# Patient Record
Sex: Male | Born: 1940 | Race: White | Hispanic: No | Marital: Married | Smoking: Former smoker
Health system: Southern US, Community
[De-identification: ages and names within clinical notes are randomized; demographics above are authoritative.]

## PROBLEM LIST (undated history)

## (undated) DIAGNOSIS — E119 Type 2 diabetes mellitus without complications: Secondary | ICD-10-CM

## (undated) DIAGNOSIS — I480 Paroxysmal atrial fibrillation: Secondary | ICD-10-CM

## (undated) DIAGNOSIS — J449 Chronic obstructive pulmonary disease, unspecified: Secondary | ICD-10-CM

## (undated) DIAGNOSIS — R0600 Dyspnea, unspecified: Secondary | ICD-10-CM

## (undated) DIAGNOSIS — Z87442 Personal history of urinary calculi: Secondary | ICD-10-CM

## (undated) DIAGNOSIS — I5020 Unspecified systolic (congestive) heart failure: Secondary | ICD-10-CM

## (undated) DIAGNOSIS — I714 Abdominal aortic aneurysm, without rupture, unspecified: Secondary | ICD-10-CM

## (undated) DIAGNOSIS — Z8719 Personal history of other diseases of the digestive system: Secondary | ICD-10-CM

## (undated) DIAGNOSIS — M4800 Spinal stenosis, site unspecified: Secondary | ICD-10-CM

## (undated) DIAGNOSIS — I495 Sick sinus syndrome: Secondary | ICD-10-CM

## (undated) DIAGNOSIS — E669 Obesity, unspecified: Secondary | ICD-10-CM

## (undated) DIAGNOSIS — I10 Essential (primary) hypertension: Secondary | ICD-10-CM

## (undated) DIAGNOSIS — M1990 Unspecified osteoarthritis, unspecified site: Secondary | ICD-10-CM

## (undated) DIAGNOSIS — C61 Malignant neoplasm of prostate: Secondary | ICD-10-CM

## (undated) DIAGNOSIS — E785 Hyperlipidemia, unspecified: Secondary | ICD-10-CM

## (undated) DIAGNOSIS — I2510 Atherosclerotic heart disease of native coronary artery without angina pectoris: Secondary | ICD-10-CM

## (undated) DIAGNOSIS — Z950 Presence of cardiac pacemaker: Secondary | ICD-10-CM

## (undated) DIAGNOSIS — G4730 Sleep apnea, unspecified: Secondary | ICD-10-CM

## (undated) HISTORY — PX: PACEMAKER INSERTION: SHX728

## (undated) HISTORY — PX: CORONARY ARTERY BYPASS GRAFT: SHX141

## (undated) HISTORY — DX: Atherosclerotic heart disease of native coronary artery without angina pectoris: I25.10

## (undated) HISTORY — PX: REPLACEMENT TOTAL KNEE BILATERAL: SUR1225

## (undated) HISTORY — PX: OTHER SURGICAL HISTORY: SHX169

## (undated) HISTORY — DX: Abdominal aortic aneurysm, without rupture: I71.4

## (undated) HISTORY — PX: CATARACT EXTRACTION: SUR2

## (undated) HISTORY — DX: Abdominal aortic aneurysm, without rupture, unspecified: I71.40

## (undated) HISTORY — DX: Chronic obstructive pulmonary disease, unspecified: J44.9

## (undated) HISTORY — DX: Essential (primary) hypertension: I10

## (undated) HISTORY — PX: JOINT REPLACEMENT: SHX530

## (undated) HISTORY — DX: Obesity, unspecified: E66.9

## (undated) HISTORY — DX: Sleep apnea, unspecified: G47.30

## (undated) HISTORY — DX: Unspecified osteoarthritis, unspecified site: M19.90

## (undated) HISTORY — DX: Sick sinus syndrome: I49.5

## (undated) HISTORY — DX: Hyperlipidemia, unspecified: E78.5

## (undated) HISTORY — DX: Spinal stenosis, site unspecified: M48.00

---

## 1998-07-31 DIAGNOSIS — I495 Sick sinus syndrome: Secondary | ICD-10-CM

## 1998-07-31 DIAGNOSIS — I2510 Atherosclerotic heart disease of native coronary artery without angina pectoris: Secondary | ICD-10-CM

## 1998-07-31 HISTORY — DX: Sick sinus syndrome: I49.5

## 1998-07-31 HISTORY — DX: Atherosclerotic heart disease of native coronary artery without angina pectoris: I25.10

## 1998-11-08 ENCOUNTER — Inpatient Hospital Stay (HOSPITAL_COMMUNITY): Admission: AD | Admit: 1998-11-08 | Discharge: 1998-11-18 | Payer: Self-pay | Admitting: Cardiology

## 1998-11-09 ENCOUNTER — Encounter: Payer: Self-pay | Admitting: Cardiothoracic Surgery

## 1998-11-11 ENCOUNTER — Encounter: Payer: Self-pay | Admitting: Cardiothoracic Surgery

## 1998-11-12 ENCOUNTER — Encounter: Payer: Self-pay | Admitting: Cardiothoracic Surgery

## 1998-11-13 ENCOUNTER — Encounter: Payer: Self-pay | Admitting: Cardiothoracic Surgery

## 1998-11-14 ENCOUNTER — Encounter: Payer: Self-pay | Admitting: Cardiothoracic Surgery

## 1998-11-16 ENCOUNTER — Encounter: Payer: Self-pay | Admitting: Cardiothoracic Surgery

## 1998-11-18 ENCOUNTER — Encounter: Payer: Self-pay | Admitting: Cardiothoracic Surgery

## 2003-06-09 ENCOUNTER — Encounter: Payer: Self-pay | Admitting: Cardiology

## 2003-12-31 ENCOUNTER — Encounter: Payer: Self-pay | Admitting: Cardiology

## 2004-05-12 ENCOUNTER — Encounter: Payer: Self-pay | Admitting: Cardiology

## 2004-09-01 ENCOUNTER — Ambulatory Visit (HOSPITAL_COMMUNITY): Admission: RE | Admit: 2004-09-01 | Discharge: 2004-09-01 | Payer: Self-pay | Admitting: Neurosurgery

## 2004-09-20 ENCOUNTER — Ambulatory Visit: Payer: Self-pay | Admitting: Internal Medicine

## 2004-10-06 ENCOUNTER — Inpatient Hospital Stay (HOSPITAL_COMMUNITY): Admission: RE | Admit: 2004-10-06 | Discharge: 2004-10-10 | Payer: Self-pay | Admitting: Neurosurgery

## 2005-09-01 ENCOUNTER — Ambulatory Visit: Payer: Self-pay | Admitting: Internal Medicine

## 2006-08-01 ENCOUNTER — Ambulatory Visit (HOSPITAL_COMMUNITY): Admission: RE | Admit: 2006-08-01 | Discharge: 2006-08-01 | Payer: Self-pay | Admitting: Neurosurgery

## 2006-08-13 ENCOUNTER — Ambulatory Visit: Payer: Self-pay | Admitting: Internal Medicine

## 2006-08-14 ENCOUNTER — Ambulatory Visit: Payer: Self-pay | Admitting: Cardiology

## 2006-08-20 ENCOUNTER — Inpatient Hospital Stay (HOSPITAL_COMMUNITY): Admission: RE | Admit: 2006-08-20 | Discharge: 2006-08-25 | Payer: Self-pay | Admitting: Neurosurgery

## 2007-08-09 ENCOUNTER — Ambulatory Visit: Payer: Self-pay | Admitting: Internal Medicine

## 2007-11-26 ENCOUNTER — Inpatient Hospital Stay (HOSPITAL_COMMUNITY): Admission: RE | Admit: 2007-11-26 | Discharge: 2007-12-01 | Payer: Self-pay | Admitting: Neurosurgery

## 2008-03-27 ENCOUNTER — Ambulatory Visit: Payer: Self-pay | Admitting: Internal Medicine

## 2008-07-27 ENCOUNTER — Encounter: Admission: RE | Admit: 2008-07-27 | Discharge: 2008-07-27 | Payer: Self-pay | Admitting: Orthopedic Surgery

## 2008-09-14 ENCOUNTER — Ambulatory Visit: Payer: Self-pay | Admitting: Internal Medicine

## 2008-10-09 ENCOUNTER — Ambulatory Visit: Payer: Self-pay | Admitting: Cardiovascular Disease

## 2008-10-16 ENCOUNTER — Inpatient Hospital Stay (HOSPITAL_COMMUNITY): Admission: RE | Admit: 2008-10-16 | Discharge: 2008-10-19 | Payer: Self-pay | Admitting: Orthopedic Surgery

## 2008-12-07 ENCOUNTER — Encounter: Payer: Self-pay | Admitting: Internal Medicine

## 2009-04-09 ENCOUNTER — Encounter: Payer: Self-pay | Admitting: Internal Medicine

## 2009-04-29 ENCOUNTER — Telehealth (INDEPENDENT_AMBULATORY_CARE_PROVIDER_SITE_OTHER): Payer: Self-pay | Admitting: *Deleted

## 2009-06-04 ENCOUNTER — Inpatient Hospital Stay (HOSPITAL_COMMUNITY): Admission: RE | Admit: 2009-06-04 | Discharge: 2009-06-07 | Payer: Self-pay | Admitting: Orthopedic Surgery

## 2009-08-18 ENCOUNTER — Telehealth (INDEPENDENT_AMBULATORY_CARE_PROVIDER_SITE_OTHER): Payer: Self-pay | Admitting: *Deleted

## 2009-10-08 ENCOUNTER — Encounter: Payer: Self-pay | Admitting: Internal Medicine

## 2009-10-22 ENCOUNTER — Ambulatory Visit: Payer: Self-pay | Admitting: Internal Medicine

## 2009-10-22 DIAGNOSIS — I10 Essential (primary) hypertension: Secondary | ICD-10-CM

## 2009-10-22 DIAGNOSIS — I495 Sick sinus syndrome: Secondary | ICD-10-CM

## 2009-10-22 DIAGNOSIS — I2510 Atherosclerotic heart disease of native coronary artery without angina pectoris: Secondary | ICD-10-CM | POA: Insufficient documentation

## 2010-01-07 ENCOUNTER — Encounter: Payer: Self-pay | Admitting: Internal Medicine

## 2010-04-08 ENCOUNTER — Encounter: Payer: Self-pay | Admitting: Internal Medicine

## 2010-07-08 ENCOUNTER — Encounter: Payer: Self-pay | Admitting: Internal Medicine

## 2010-08-25 LAB — COMPREHENSIVE METABOLIC PANEL
AST: 22 U/L (ref 0–37)
Albumin: 4.2 g/dL (ref 3.5–5.2)
Chloride: 105 mEq/L (ref 96–112)
Creatinine, Ser: 1.57 mg/dL — ABNORMAL HIGH (ref 0.4–1.5)
Potassium: 4.6 mEq/L (ref 3.5–5.1)
Total Bilirubin: 0.7 mg/dL (ref 0.3–1.2)
Total Protein: 7.5 g/dL (ref 6.0–8.3)

## 2010-08-25 LAB — SURGICAL PCR SCREEN: Staphylococcus aureus: POSITIVE — AB

## 2010-08-25 LAB — URINALYSIS, ROUTINE W REFLEX MICROSCOPIC
Bilirubin Urine: NEGATIVE
Ketones, ur: NEGATIVE mg/dL
Nitrite: NEGATIVE
Urobilinogen, UA: 0.2 mg/dL (ref 0.0–1.0)

## 2010-08-25 LAB — CBC
HCT: 43.3 % (ref 39.0–52.0)
MCH: 31.2 pg (ref 26.0–34.0)
MCHC: 33.9 g/dL (ref 30.0–36.0)
RDW: 14.2 % (ref 11.5–15.5)

## 2010-08-25 LAB — APTT: aPTT: 32 seconds (ref 24–37)

## 2010-08-31 ENCOUNTER — Inpatient Hospital Stay (HOSPITAL_COMMUNITY)
Admission: RE | Admit: 2010-08-31 | Discharge: 2010-09-03 | DRG: 470 | Disposition: A | Discharge status: Home Health | Payer: Medicare Other | Attending: Orthopedic Surgery | Admitting: Orthopedic Surgery

## 2010-08-31 DIAGNOSIS — Z951 Presence of aortocoronary bypass graft: Secondary | ICD-10-CM

## 2010-08-31 DIAGNOSIS — I2510 Atherosclerotic heart disease of native coronary artery without angina pectoris: Secondary | ICD-10-CM | POA: Diagnosis present

## 2010-08-31 DIAGNOSIS — G4733 Obstructive sleep apnea (adult) (pediatric): Secondary | ICD-10-CM | POA: Diagnosis present

## 2010-08-31 DIAGNOSIS — E119 Type 2 diabetes mellitus without complications: Secondary | ICD-10-CM | POA: Diagnosis present

## 2010-08-31 DIAGNOSIS — M1710 Unilateral primary osteoarthritis, unspecified knee: Principal | ICD-10-CM | POA: Diagnosis present

## 2010-08-31 DIAGNOSIS — Z96659 Presence of unspecified artificial knee joint: Secondary | ICD-10-CM

## 2010-08-31 DIAGNOSIS — E7800 Pure hypercholesterolemia, unspecified: Secondary | ICD-10-CM | POA: Diagnosis present

## 2010-08-31 DIAGNOSIS — I10 Essential (primary) hypertension: Secondary | ICD-10-CM | POA: Diagnosis present

## 2010-08-31 LAB — GLUCOSE, CAPILLARY
Glucose-Capillary: 126 mg/dL — ABNORMAL HIGH (ref 70–99)
Glucose-Capillary: 121 mg/dL — ABNORMAL HIGH (ref 70–99)
Glucose-Capillary: 182 mg/dL — ABNORMAL HIGH (ref 70–99)

## 2010-08-31 LAB — TYPE AND SCREEN: ABO/RH(D): O POS

## 2010-08-31 NOTE — Op Note (Signed)
[name], [name]               ACCOUNT NO.:  [id number]  MEDICAL RECORD NO.:  [id number]           PATIENT TYPE:  I  LOCATION:  0004                         FACILITY:  [facility]  PHYSICIAN:  [name], M.D.    DATE OF BIRTH:  [birth year]  DATE OF PROCEDURE: DATE OF DISCHARGE:                              OPERATIVE REPORT   PREOPERATIVE DIAGNOSIS:  Osteoarthritis, left knee.  POSTOPERATIVE DIAGNOSIS:  Osteoarthritis, left knee.  PROCEDURE:  Left total knee arthroplasty.  SURGEON:  [name], M.D.  ASSISTANT:  Arlee Muslim, PA-C  ANESTHESIA:  General.  ESTIMATED BLOOD LOSS:  Minimal.  DRAINS:  Hemovac times one.  TOURNIQUET TIME:  11 minutes at 300 mmHg.  COMPLICATIONS:  None.  CONDITION:  Stable to recovery room.  BRIEF CLINICAL NOTE:  Mr. Arnzen is a 70 year old male with advanced end- stage arthritis of the left knee with progressively worsening pain and dysfunction.  He has had a recent successful right total knee arthroplasty and presents now for a left total knee arthroplasty.  PROCEDURE IN DETAIL:  After successful administration of general anesthetic, a tourniquet was placed high on his left thigh, and his left lower extremity prepped and draped in the usual sterile fashion. Extremity was wrapped in Esmarch, knee flexed and tourniquet inflated to 300 mmHg.  Midline incision was made with a 10 blade through subcutaneous tissue to the extensor mechanism.  A fresh blade was used to make a medial parapatellar arthrotomy.  Soft tissue of proximal medial tibia was subperiosteally elevated to the joint line with the knife and into the semimembranosus bursa with a Cobb elevator.  Soft tissue laterally was elevated with attention being paid to avoid the patella tendon on tibial tubercle.  Patella was everted, knee flexed 90 degrees and ACL and PCL removed.  Drill was used create a starting hole in the distal femur.  Canal was thoroughly irrigated.  A 5-degree  left valgus alignment guide was placed.  Referencing off the posterior condyles, rotations marked and the block pinned to remove 11 mm of the distal femur.  Distal femoral resection was made with an oscillating saw.  The tibia was subluxed forward, and the menisci were removed. Extramedullary tibial alignment guide was placed referencing proximally at the medial aspect of the tibial tubercle and distally along the second metatarsal axis tibial crest.  Blocks pinned to remove 2 mm off the more deficient medial side.  There was a large tibial defect, so we took a little more bone than usual at the base of this defect.  Size 5 was the most appropriate tibial component, and proximal tibia was then prepared with the modular drill and keel punch for the size 5.  At this point, it is noted that the tourniquet was not functioning and was bleeding through the tourniquet.  We deflated the tourniquet, and the bleeding actually decreased.  We then placed the sizing block on the femur, and size 4 was most appropriate.  Rotation was marked off the epicondylar axis and confirmed by creating rectangular flexion gap at 90 degrees.  A size 4 cutting block was placed in this rotation  and the anterior-posterior and chamfer cuts made.  Intercondylar blocks were placed, and that cut was made. Trial size 4 posterior stabilized femur was placed.  A 12.5 mm posterior stabilized rotating platform insert trial was placed.  There was a little play with the 12.5, so I went to 15, which allowed for full extension with excellent varus-valgus and anterior-posterior balance throughout full range of motion.  Patella was everted, thickness measured to be 25 mm.  Freehand resection was taken to 13 mm, 41 template was placed, lug holes were drilled, trial patella was placed, and it tracked normally.  Osteophytes were removed off the posterior femur with the trial in place.  All trials were removed, and the cut bone  surfaces were prepared with pulsatile lavage.  The cement was mixed, and once ready for implantation, the size 5 mobile bearing tibial tray, size 4 posterior stabilized femur and 41 patella were cemented into place.  Patella was held with a clamp.  Trial 15-mm insert was placed, knee held in full extension, and all extruded cement removed. When cement was fully hardened, then the permanent 15-mm posterior stabilized rotating platform insert was placed in the tibial tray.  The wound was copiously irrigated with saline solution, and the arthrotomy closed over Hemovac drain with interrupted #1 PDS.  Flexion against gravity was 140 degrees.  Patella tracked normally.  Subcutaneous was closed with interrupted 2-0 Vicryl, subcuticular with running 4-0 Monocryl.  Catheter for the Marcaine pain pump was placed, and the pump was initiated.  Incision was cleaned and dried, and Steri-Strips and a bulky sterile dressing applied.  He was then placed into a knee immobilizer, awakened and transported to recovery in stable condition.     [name], M.D.     FA/MEDQ  D:  08/31/2010  T:  08/31/2010  Job:  [id number]  Electronically Signed by [name] M.D. on 08/31/2010 03:40:47 PM

## 2010-09-01 LAB — GLUCOSE, CAPILLARY
Glucose-Capillary: 164 mg/dL — ABNORMAL HIGH (ref 70–99)
Glucose-Capillary: 111 mg/dL — ABNORMAL HIGH (ref 70–99)

## 2010-09-01 LAB — BASIC METABOLIC PANEL
BUN: 18 mg/dL (ref 6–23)
Chloride: 104 mEq/L (ref 96–112)
Creatinine, Ser: 1.27 mg/dL (ref 0.4–1.5)
Glucose, Bld: 180 mg/dL — ABNORMAL HIGH (ref 70–99)
Potassium: 4.5 mEq/L (ref 3.5–5.1)

## 2010-09-01 LAB — CBC
HCT: 33 % — ABNORMAL LOW (ref 39.0–52.0)
Hemoglobin: 11.1 g/dL — ABNORMAL LOW (ref 13.0–17.0)
MCH: 30.9 pg (ref 26.0–34.0)
MCHC: 33.6 g/dL (ref 30.0–36.0)
RDW: 14.1 % (ref 11.5–15.5)

## 2010-09-01 LAB — PROTIME-INR: INR: 1.17 (ref 0.00–1.49)

## 2010-09-01 NOTE — Cardiovascular Report (Signed)
Summary: TTM   TTM   Imported By: Sallee Provencal 04/20/2010 11:49:13  _____________________________________________________________________  External Attachment:    Type:   Image     Comment:   External Document

## 2010-09-01 NOTE — Cardiovascular Report (Signed)
Summary: TTM   TTM   Imported By: Sallee Provencal 07/29/2010 11:45:33  _____________________________________________________________________  External Attachment:    Type:   Image     Comment:   External Document

## 2010-09-01 NOTE — Cardiovascular Report (Signed)
Summary: Card Device [facility]/ INTERROGATION REPORT  Card Device Clinic/ INTERROGATION REPORT   Imported By: Bartholomew Boards 10/25/2009 12:38:05  _____________________________________________________________________  External Attachment:    Type:   Image     Comment:   External Document

## 2010-09-01 NOTE — Cardiovascular Report (Signed)
Summary: TTM  TTM   Imported By: Sallee Provencal 01/20/2010 10:08:47  _____________________________________________________________________  External Attachment:    Type:   Image     Comment:   External Document

## 2010-09-01 NOTE — Cardiovascular Report (Signed)
Summary: TTM  TTM   Imported By: Sallee Provencal 10/28/2009 12:46:15  _____________________________________________________________________  External Attachment:    Type:   Image     Comment:   External Document

## 2010-09-01 NOTE — Progress Notes (Signed)
Summary: phillips wants to check pacer more often  Phone Note Call from Patient Call back at Home Phone [phone]   Caller: Patient Reason for Call: Talk to Nurse Summary of Call: pt was followed in the [place] office and since Dr Caryl Comes dosen't go phillips wants to do phone checks more and since we have not reponded they are about to drop him and he needs help with this Initial call taken by: Shelda Pal,  August 18, 2009 11:55 AM  Follow-up for Phone Call        The patient recieved a letter from [facility] needing authorization for renewal for TTM's.  I left a message with Jennet Maduro @ [facility] to address this and all of our patients testing with them.  Mr. Litfin is to call the [facility] office to schedule his yearly appointment for an in-office pacer check with Dr. Rayann Heman and I will have the issue taken care of with Hardin Negus by the time he due for his next TTM which is 3 months from his check in office.  Follow-up by: Alma Friendly, LPN,  January 19, 624THL 1:42 PM

## 2010-09-01 NOTE — Assessment & Plan Note (Signed)
Summary: PACER CK   Visit Type:  Pacemaker check Primary Provider:  Manuella Ghazi  CC:  pacer check.  History of Present Illness: The patient presents today for routine electrophysiology followup. He reports doing very well since last being seen in our clinic. The patient denies symptoms of palpitations, chest pain, shortness of breath, orthopnea, PND, lower extremity edema, dizziness, presyncope, syncope, or neurologic sequela. The patient is tolerating medications without difficulties and is otherwise without complaint today.   Preventive Screening-Counseling & Management  Alcohol-Tobacco     Smoking Status: quit     Year Quit: 2000  Current Medications (verified): 1)  Actos 15 Mg Tabs (Pioglitazone Hcl) .... Take 1 Tablet By Mouth Once A Day 2)  Metformin Hcl 500 Mg Tabs (Metformin Hcl) .... Take 1 Tablet By Mouth Once A Day 3)  Amaryl 2 Mg Tabs (Glimepiride) .... Take 1 Tablet By Mouth Once A Day 4)  Diclofenac Sodium 75 Mg Tbec (Diclofenac Sodium) .... Take 1 Tablet By Mouth Two Times A Day 5)  Atenolol 50 Mg Tabs (Atenolol) .... Take 1 Tablet By Mouth Once A Day 6)  Altace 5 Mg Caps (Ramipril) .... Take 1 Tablet By Mouth Once A Day 7)  Simvastatin 20 Mg Tabs (Simvastatin) .... Take 1 Tablet By Mouth Once A Day 8)  Aspir-Low 81 Mg Tbec (Aspirin) .... Take 1 Tablet By Mouth Once A Day 9)  Multivitamins  Tabs (Multiple Vitamin) .... Take 1 Tablet By Mouth Once A Day  Past History:  Past Medical History: 1. CAD, status post CABG. 2. Symptomatic bradycardia, status post permanent pacemaker. 3. Spinal stenosis s/p multiple prior surgeries 4. Severe osteoarthritis s/p hip and knee replacements 5. Hypertension. 6. Type 2 diabetes. 7. Dyslipidemia.  8. Obesity  Family History: Father died at 25 from a stroke.  Mother died at 11 of a chronic kidney disease and hypertension.   Social History: The patient is a retired Software engineer.  He worked at [employer] for many years.  He retired in  2007.  He smoked cigarettes until 2000.  No history of alcohol abuse.  Smoking Status:  quit  Vital Signs:  Patient profile:   70 year old male Height:      68 inches Weight:      258 pounds BMI:     39.37 Pulse rate:   68 / minute BP sitting:   158 / 79  (left arm) Cuff size:   large  Vitals Entered By: Georgina Peer (October 22, 2009 4:20 PM)  Nutrition Counseling: Patient's BMI is greater than 25 and therefore counseled on weight management options.  Serial Vital Signs/Assessments:  Time      Position  BP       Pulse  Resp  Temp     By 4:24 PM             147/80   62                    Georgina Peer  CC: pacer check   Physical Exam  General:  obese, NAD Head:  normocephalic and atraumatic Eyes:  PERRLA/EOM intact; conjunctiva and lids normal. Mouth:  Teeth, gums and palate normal. Oral mucosa normal. Neck:  Neck supple, no JVD. No masses, thyromegaly or abnormal cervical nodes. Chest Wall:  pacemaker site is well healed Lungs:  Clear bilaterally to auscultation and percussion. Heart:  Non-displaced PMI, chest non-tender; regular rate and rhythm, S1, S2 without murmurs, rubs or gallops.  Abdomen:  obese, Bowel sounds positive; abdomen soft and non-tender without masses, organomegaly, or hernias noted. No hepatosplenomegaly. Msk:  s/p prior hip and knee replacement Pulses:  pulses normal in all 4 extremities Extremities:  1+ chronic BLE edema Neurologic:  Alert and oriented x 3. Skin:  Intact without lesions or rashes. Cervical Nodes:  no significant adenopathy Psych:  Normal affect.   PPM Specifications Following MD:  Thompson Grayer, MD     PPM Vendor:  [employer]     PPM Model Number:  [id number]     PPM Serial Number:  [id number] PPM DOI:  11/17/1998     PPM Implanting MD:  Virl Axe, MD  Lead 1    Location: RA     DOI: 11/17/1998     Model #: [id number]     Serial #: [id number]     Status: active Lead 2    Location: RV     DOI: 11/17/1998     Model #: [id number]     Serial #[id number]     Status: active  Magnet Response Rate:  BOL 85 ERI  65  Indications:  Profound brady with HB   PPM Follow Up Remote Check?  No Battery Voltage:  2.77 V     Battery Est. Longevity:  3 years     Pacer Dependent:  No       PPM Device Measurements Atrium  Amplitude: 2.8 mV, Impedance: 374 ohms, Threshold: 1.0 V at 0.4 msec Right Ventricle  Amplitude: 8.0 mV, Impedance: 475 ohms, Threshold: 0.5 V at 0.4 msec  Episodes MS Episodes:  0     Percent Mode Switch:  0     Coumadin:  No Ventricular High Rate:  2     Atrial Pacing:  11%     Ventricular Pacing:  1.3%  Parameters Mode:  DDD     Lower Rate Limit:  55     Upper Rate Limit:  140 Paced AV Delay:  240     Sensed AV Delay:  240 Next Cardiology Appt Due:  03/31/2010 Tech Comments:  No parameter changes.  Device function normal.  TTM's with Hardin Negus.  ROV 6 months [facility] clinic. Alma Friendly, LPN  March 25, 624THL 624THL PM  MD Comments:  agree  Impression & Recommendations:  Problem # 1:  BRADYCARDIA (ICD-427.89) normal pacemaker function no changes today  Problem # 2:  ESSENTIAL HYPERTENSION, BENIGN (ICD-401.1) above goal lisinopril is increased to 10mg  two times a day today the patient is instructed to have Dr Brigitte Pulse check a BMET in 6 wks.  Problem # 3:  CAD (ICD-414.00) stable without sypmtoms of ischemia regular exercise and weight loss are advised  Patient Instructions: 1)  Increase altace (ramipril) to 10mg  by mouth two times a day  Prescriptions: RAMIPRIL 10 MG CAPS (RAMIPRIL) Take one capsule by mouth  two times a day  #60 x 6   Entered by:   Gurney Maxin, RN, BSN   Authorized by:   Thompson Grayer, MD   Signed by:   Gurney Maxin, RN, BSN on 10/22/2009   Method used:   Electronically to        [facility] (retail)       [address]       [place], [place]  [address]       Ph: [phone]       Fax: [phone]   RxID:   [id number]

## 2010-09-02 LAB — BASIC METABOLIC PANEL
BUN: 12 mg/dL (ref 6–23)
CO2: 24 mEq/L (ref 19–32)
Calcium: 8.7 mg/dL (ref 8.4–10.5)
Glucose, Bld: 171 mg/dL — ABNORMAL HIGH (ref 70–99)
Sodium: 134 mEq/L — ABNORMAL LOW (ref 135–145)

## 2010-09-02 LAB — CBC
MCH: 30.8 pg (ref 26.0–34.0)
MCHC: 33.9 g/dL (ref 30.0–36.0)
MCV: 91.1 fL (ref 78.0–100.0)
Platelets: 139 10*3/uL — ABNORMAL LOW (ref 150–400)
RBC: 3.47 MIL/uL — ABNORMAL LOW (ref 4.22–5.81)
RDW: 13.9 % (ref 11.5–15.5)

## 2010-09-02 LAB — GLUCOSE, CAPILLARY
Glucose-Capillary: 227 mg/dL — ABNORMAL HIGH (ref 70–99)
Glucose-Capillary: 147 mg/dL — ABNORMAL HIGH (ref 70–99)

## 2010-09-02 LAB — PROTIME-INR: Prothrombin Time: 15.5 seconds — ABNORMAL HIGH (ref 11.6–15.2)

## 2010-09-03 LAB — PROTIME-INR: INR: 1.47 (ref 0.00–1.49)

## 2010-09-03 LAB — BASIC METABOLIC PANEL
BUN: 21 mg/dL (ref 6–23)
CO2: 22 mEq/L (ref 19–32)
Chloride: 102 mEq/L (ref 96–112)
Creatinine, Ser: 1.35 mg/dL (ref 0.4–1.5)

## 2010-09-03 LAB — CBC
HCT: 29.6 % — ABNORMAL LOW (ref 39.0–52.0)
MCHC: 34.8 g/dL (ref 30.0–36.0)
MCV: 89.7 fL (ref 78.0–100.0)
Platelets: 145 10*3/uL — ABNORMAL LOW (ref 150–400)
RDW: 13.8 % (ref 11.5–15.5)

## 2010-09-14 NOTE — H&P (Signed)
[name], [name]               ACCOUNT NO.:  [id number]  MEDICAL RECORD NO.:  [id number]           PATIENT TYPE:  I  LOCATION:  [id number]                         FACILITY:  [facility]  PHYSICIAN:  [name], M.D.    DATE OF BIRTH:  1941/03/02  DATE OF ADMISSION:  08/31/2010 DATE OF DISCHARGE:  09/03/2010                             HISTORY & PHYSICAL   CHIEF COMPLAINT:  Left knee pain.  HISTORY OF PRESENT ILLNESS:  The patient is a 70 year old male well known to Dr. Pilar Plate Meya Clutter.  He has undergone a previous right total knee and done well.  He now presents to have the other side done.  He has end-stage arthritis that has progressively gotten worse.  He has been seen preoperatively by Dr. Manuella Ghazi and felt to be stable for up and coming procedure.  ALLERGIES:  No known drug allergies.  CURRENT MEDICATIONS:  Actos, metformin, atenolol, ramipril, diclofenac, Amaryl, baby aspirin, vitamin.  PAST MEDICAL HISTORY:  Hypertension, coronary arterial disease, sleep apnea using CPAP machine, hypercholesterolemia, non-insulin-dependent diabetes mellitus, history of renal calculi.  He had a past history of asystole/arrhythmia requiring pacemaker placement.  PAST SURGICAL HISTORY:  Coronary artery bypass grafting of four vessels, status post pacemaker placement secondary to asystole and arrhythmia problems, back surgery x3, right knee scope, right total knee arthroplasty, right total hip replacement.  SOCIAL HISTORY:  Retired Software engineer.  Past smoker, quit in about 2000. No alcohol.  His wife and daughter, who is an Therapist, sports, will be helping him after surgery.  He does have a living will.  FAMILY HISTORY:  Father with stroke.  Mother with hypertension, heart attack.  Oldest sister with hypertension and stroke.  REVIEW OF SYSTEMS:  GENERAL:  No fevers, chills, or night sweats. NEURO:  No seizures, syncope, or paralysis.  RESPIRATORY:  No shortness of breath, productive cough, or hemoptysis.  He  does get a little shortness of breath on exertion, but no shortness of breath at rest. CARDIOVASCULAR:  No chest pain, no orthopnea.  GI:  No nausea, vomiting, diarrhea, constipation.  GU:  No dysuria, hematuria, discharge.  He does have some nocturia.  MUSCULOSKELETAL:  Knee pain.  PHYSICAL EXAM:  VITAL SIGNS:  Pulse 64, respirations 12, blood pressure 142/96. GENERAL:  A 70 year old white male, well nourished, well developed, in no acute distress.  Slightly overweight.  He is alert, oriented, and cooperative.  An excellent historian. HEENT: Normocephalic, atraumatic.  Pupils are round and reactive.  EOMs intact. NECK:  Supple. CHEST:  Clear. HEART:  Regular rate and rhythm without murmur. ABDOMEN:  Soft, nontender.  Bowel sounds present. RECTAL/BREAST/GENITALIA:  Not done and not pertinent to present illness. EXTREMITIES:  Left knee range of motion 10-115.  Marked crepitus.  No tenderness, no instability.  IMPRESSION:  Osteoarthritis, left knee.  PLAN:  The patient admitted to [facility] to undergo a left total knee replacement arthroplasty.  Surgery will be performed by Dr. [name].     Alexzandrew L. Dara Lords, P.A.C.   ______________________________ [name], M.D.    ALP/MEDQ  D:  09/08/2010  T:  09/08/2010  Job:  [id number]  cc:   Dr. Regina [facility] fax# [phone]  Electronically Signed by Mickel Crow P.A.C. on 09/12/2010 08:26:06 AM Electronically Signed by [name] M.D. on 09/14/2010 02:53:59 PM

## 2010-10-19 NOTE — Discharge Summary (Signed)
[name], [name]               ACCOUNT NO.:  [id number]  MEDICAL RECORD NO.:  [id number]           PATIENT TYPE:  I  LOCATION:  [id number]                         FACILITY:  [facility]  PHYSICIAN:  [name], M.D.    DATE OF BIRTH:  [birth year]  DATE OF ADMISSION:  08/31/2010 DATE OF DISCHARGE:  09/03/2010                              DISCHARGE SUMMARY   ADMITTING DIAGNOSES: 1. Osteoarthritis, left knee. 2. Hypertension. 3. Coronary artery disease. 4. Sleep apnea, using CPAP. 5. Hypercholesterolemia. 6. Non-insulin-dependent diabetes mellitus. 7. Renal calculi. 8. Past history of asystole/arrhythmia, requiring pacemaker placement.  DISCHARGE DIAGNOSES: 1. Osteoarthritis, left knee status post left total knee replacement     arthroplasty. 2. Postop hyponatremia, stable. 3. Hypertension. 4. Coronary artery disease. 5. Sleep apnea, using CPAP. 6. Hypercholesterolemia. 7. Non-insulin-dependent diabetes mellitus. 8. Renal calculi. 9. Past history of asystole/arrhythmia, requiring pacemaker placement.  PROCEDURE:  August 31, 2010, left total knee.  SURGEON:  [name], M.D  ASSISTANT:  Alexzandrew L. Perkins, P.A.C.  ANESTHESIA:  General.  TOURNIQUET TIME:  11 minutes.  CONSULTATIONS:  None.  BRIEF HISTORY:  The patient is a 70 year old male with advanced arthritis of left knee, progressive worsening pain and dysfunction, failed nonoperative management, now presents for total knee arthroplasty.  LABORATORY DATA:  Preop CBC showed a hemoglobin 14.7, hematocrit of 42.3, white cell count 6.3, platelets 174.  Postop hemoglobin 11.1 and 10.7.  Last noted hemoglobin 10.3 and hematocrit 29.6.  PT/INR 13.2/0.98 with a PTT of 32.  Serial prothrombin times followed per Coumadin protocol.  Last noted PT/INR 18.0/1.47.  Chem panel on admission, elevated BUN of 30, elevated creatinine 1.57.  Remaining Chem panel within normal limits.  Serial BMETs were followed.   Electrolytes remained within normal limits with the exception of sodium dropping down from 139 to 134, but stabilized at 134.  BUN came down to a normal level of 21.  Creatinine came down to normal level of 1.35.  Preop UA negative.  Blood group type O positive.  Nasal swabs were positive for Staph aureus and positive for MRSA.  X-rays:  Chest x-ray August 25, 2010, no active cardiopulmonary disease.  EKG, dated August 25, 2010, sinus bradycardia, nonspecific ST abnormalities since previous tracing.  Atrial pacer is not present, confirmed by Dr. Romeo Apple.  HOSPITAL COURSE:  The patient was admitted to [facility], taken to OR, underwent above-stated procedure without complication.  The patient tolerated the procedure well and later transferred to the recovery room and then to orthopedic floor, and started on p.o. and IV analgesics for pain control following surgery.  Actually, doing pretty well on the morning of day #1.  Rate was controlled in the 60s.  Pain was under good control.  We started back on all of his home meds.  We encouraged his mobility with therapy, allowed to be weightbearing as tolerated.  By day #2, he was doing well.  Dressing changed and incision looked good.  Hemoglobin stable at 10.7.  He continued progressing well with therapy and seen over the weekend by weekend coverage, tolerating his meds, and he was discharged home.  DISCHARGE PLAN: 1. The patient was discharged home on September 03, 2010. 2. Discharge diagnoses, please see above. 3. Discharge meds, Robaxin, Percocet, Coumadin.  Continue home meds of     Actos, Altace, Amaryl, baby aspirin, atenolol, metformin, and     simvastatin.  DIET:  Heart-healthy diabetic diet.  ACTIVITY:  Total knee protocol, weightbearing as tolerated, home health PT, home health nursing.  FOLLOWUP:  Two weeks.  DISPOSITION:  Home.  CONDITION ON DISCHARGE:  Improved.     Alexzandrew L. Dara Lords,  P.A.C.   ______________________________ [name], M.D.    ALP/MEDQ  D:  10/06/2010  T:  10/07/2010  Job:  [id number]  cc:   Monico Blitz, MD Fax: [phone]  Electronically Signed by Mickel Crow P.A.C. on 10/07/2010 10:07:56 AM Electronically Signed by [name] M.D. on 10/19/2010 [id number] AM

## 2010-11-02 LAB — BASIC METABOLIC PANEL
BUN: 17 mg/dL (ref 6–23)
CO2: 26 mEq/L (ref 19–32)
Chloride: 101 mEq/L (ref 96–112)
Creatinine, Ser: 1.29 mg/dL (ref 0.4–1.5)
GFR calc non Af Amer: 41 mL/min — ABNORMAL LOW (ref >60)
Glucose, Bld: 124 mg/dL — ABNORMAL HIGH (ref 70–99)
Potassium: 4 mEq/L (ref 3.5–5.1)
Sodium: 135 mEq/L (ref 135–145)

## 2010-11-02 LAB — CBC
HCT: 38.8 % — ABNORMAL LOW (ref 39.0–52.0)
Hemoglobin: 13.6 g/dL (ref 13.0–17.0)
Hemoglobin: 14.9 g/dL (ref 13.0–17.0)
MCHC: 33.8 g/dL (ref 30.0–36.0)
MCHC: 35 g/dL (ref 30.0–36.0)
MCHC: 35.3 g/dL (ref 30.0–36.0)
MCV: 92.5 fL (ref 78.0–100.0)
MCV: 93 fL (ref 78.0–100.0)
MCV: 93.5 fL (ref 78.0–100.0)
Platelets: 142 10*3/uL — ABNORMAL LOW (ref 150–400)
Platelets: 144 10*3/uL — ABNORMAL LOW (ref 150–400)
RBC: 4.01 MIL/uL — ABNORMAL LOW (ref 4.22–5.81)
RBC: 4.19 MIL/uL — ABNORMAL LOW (ref 4.22–5.81)
RBC: 4.71 MIL/uL (ref 4.22–5.81)
RDW: 14 % (ref 11.5–15.5)
RDW: 14.3 % (ref 11.5–15.5)
WBC: 7.7 10*3/uL (ref 4.0–10.5)

## 2010-11-02 LAB — PROTIME-INR
Prothrombin Time: 12.7 seconds (ref 11.6–15.2)
Prothrombin Time: 13.7 seconds (ref 11.6–15.2)
Prothrombin Time: 18.5 seconds — ABNORMAL HIGH (ref 11.6–15.2)

## 2010-11-02 LAB — GLUCOSE, CAPILLARY
Glucose-Capillary: 114 mg/dL — ABNORMAL HIGH (ref 70–99)
Glucose-Capillary: 125 mg/dL — ABNORMAL HIGH (ref 70–99)
Glucose-Capillary: 130 mg/dL — ABNORMAL HIGH (ref 70–99)
Glucose-Capillary: 82 mg/dL (ref 70–99)
Glucose-Capillary: 99 mg/dL (ref 70–99)

## 2010-11-02 LAB — TYPE AND SCREEN: Antibody Screen: NEGATIVE

## 2010-11-02 LAB — COMPREHENSIVE METABOLIC PANEL
ALT: 27 U/L (ref 0–53)
BUN: 26 mg/dL — ABNORMAL HIGH (ref 6–23)
CO2: 26 mEq/L (ref 19–32)
Calcium: 9.6 mg/dL (ref 8.4–10.5)
Creatinine, Ser: 1.24 mg/dL (ref 0.4–1.5)
GFR calc non Af Amer: 58 mL/min — ABNORMAL LOW (ref >60)
Glucose, Bld: 115 mg/dL — ABNORMAL HIGH (ref 70–99)

## 2010-11-02 LAB — URINALYSIS, ROUTINE W REFLEX MICROSCOPIC
Nitrite: NEGATIVE
Specific Gravity, Urine: 1.016 (ref 1.005–1.030)
Urobilinogen, UA: 0.2 mg/dL (ref 0.0–1.0)

## 2010-11-10 LAB — CBC
HCT: 28.9 % — ABNORMAL LOW (ref 39.0–52.0)
HCT: 29.8 % — ABNORMAL LOW (ref 39.0–52.0)
HCT: 38.2 % — ABNORMAL LOW (ref 39.0–52.0)
Hemoglobin: 10.1 g/dL — ABNORMAL LOW (ref 13.0–17.0)
Hemoglobin: 12.9 g/dL — ABNORMAL LOW (ref 13.0–17.0)
MCHC: 33.8 g/dL (ref 30.0–36.0)
MCHC: 34.2 g/dL (ref 30.0–36.0)
MCV: 88.6 fL (ref 78.0–100.0)
Platelets: 191 10*3/uL (ref 150–400)
RBC: 3.26 MIL/uL — ABNORMAL LOW (ref 4.22–5.81)
RBC: 3.35 MIL/uL — ABNORMAL LOW (ref 4.22–5.81)
RBC: 3.4 MIL/uL — ABNORMAL LOW (ref 4.22–5.81)
RBC: 4.24 MIL/uL (ref 4.22–5.81)
RDW: 14.1 % (ref 11.5–15.5)
RDW: 14.3 % (ref 11.5–15.5)
RDW: 14.1 % (ref 11.5–15.5)
WBC: 9.5 10*3/uL (ref 4.0–10.5)

## 2010-11-10 LAB — GLUCOSE, CAPILLARY
Glucose-Capillary: 102 mg/dL — ABNORMAL HIGH (ref 70–99)
Glucose-Capillary: 118 mg/dL — ABNORMAL HIGH (ref 70–99)
Glucose-Capillary: 134 mg/dL — ABNORMAL HIGH (ref 70–99)
Glucose-Capillary: 137 mg/dL — ABNORMAL HIGH (ref 70–99)
Glucose-Capillary: 139 mg/dL — ABNORMAL HIGH (ref 70–99)
Glucose-Capillary: 97 mg/dL (ref 70–99)

## 2010-11-10 LAB — BASIC METABOLIC PANEL
BUN: 10 mg/dL (ref 6–23)
CO2: 24 mEq/L (ref 19–32)
Calcium: 7.9 mg/dL — ABNORMAL LOW (ref 8.4–10.5)
Chloride: 98 mEq/L (ref 96–112)
Creatinine, Ser: 1.12 mg/dL (ref 0.4–1.5)
GFR calc Af Amer: >60 mL/min (ref >60)
GFR calc Af Amer: >60 mL/min (ref >60)
GFR calc non Af Amer: 54 mL/min — ABNORMAL LOW (ref >60)
Potassium: 3.7 mEq/L (ref 3.5–5.1)
Sodium: 132 mEq/L — ABNORMAL LOW (ref 135–145)

## 2010-11-10 LAB — COMPREHENSIVE METABOLIC PANEL
ALT: 19 U/L (ref 0–53)
Alkaline Phosphatase: 118 U/L — ABNORMAL HIGH (ref 39–117)
BUN: 17 mg/dL (ref 6–23)
CO2: 27 mEq/L (ref 19–32)
GFR calc non Af Amer: >60 mL/min (ref >60)
Glucose, Bld: 108 mg/dL — ABNORMAL HIGH (ref 70–99)
Potassium: 4.5 mEq/L (ref 3.5–5.1)
Sodium: 142 mEq/L (ref 135–145)
Total Bilirubin: 0.8 mg/dL (ref 0.3–1.2)

## 2010-11-10 LAB — PROTIME-INR
INR: 1.2 (ref 0.00–1.49)
INR: 1.4 (ref 0.00–1.49)
Prothrombin Time: 15.3 seconds — ABNORMAL HIGH (ref 11.6–15.2)
Prothrombin Time: 14.4 seconds (ref 11.6–15.2)

## 2010-11-10 LAB — TYPE AND SCREEN

## 2010-11-10 LAB — URINALYSIS, ROUTINE W REFLEX MICROSCOPIC
Glucose, UA: NEGATIVE mg/dL
Hgb urine dipstick: NEGATIVE
Ketones, ur: NEGATIVE mg/dL
pH: 6.5 (ref 5.0–8.0)

## 2010-11-16 ENCOUNTER — Encounter: Payer: Self-pay | Admitting: Internal Medicine

## 2010-11-16 ENCOUNTER — Ambulatory Visit (INDEPENDENT_AMBULATORY_CARE_PROVIDER_SITE_OTHER): Payer: Medicare Other | Admitting: Internal Medicine

## 2010-11-16 DIAGNOSIS — I10 Essential (primary) hypertension: Secondary | ICD-10-CM

## 2010-11-16 DIAGNOSIS — I498 Other specified cardiac arrhythmias: Secondary | ICD-10-CM

## 2010-11-16 DIAGNOSIS — I2510 Atherosclerotic heart disease of native coronary artery without angina pectoris: Secondary | ICD-10-CM

## 2010-11-16 NOTE — Assessment & Plan Note (Signed)
Normal pacemaker function See Pace Art report No changes today  

## 2010-11-16 NOTE — Assessment & Plan Note (Signed)
No ischemic symptoms No changes today 

## 2010-11-16 NOTE — Progress Notes (Signed)
The patient presents today for routine electrophysiology followup.  Since last being seen in our clinic, he reports doing very well.  Today, he denies symptoms of palpitations, chest pain, shortness of breath, orthopnea, PND, lower extremity edema, dizziness, presyncope, syncope, or neurologic sequela.  The patient feels that he is tolerating medications without difficulties and is otherwise without complaint today.   Past Medical History  Diagnosis Date  . Coronary artery disease     Status post CABG  . Hypertension   . Diabetes mellitus     DM2  . Sleep apnea     USING CPAP  . Bradycardia     s/p PPM  . Spinal stenosis   . Osteoarthritis   . Obesity   . Dyslipidemia    Past Surgical History  Procedure Date  . Coronary artery bypass graft   . L5-s1 gill decompressive laminectomy   . Right total hip arthroplasty.   . Pacemaker insertion     Current Outpatient Prescriptions  Medication Sig Dispense Refill  . aspirin 81 MG tablet Take 81 mg by mouth daily.        [name] atenolol (TENORMIN) 50 MG tablet Take 50 mg by mouth daily.        . diclofenac (VOLTAREN) 75 MG EC tablet Take 75 mg by mouth 2 (two) times daily.        [name] glimepiride (AMARYL) 2 MG tablet Take 2 mg by mouth daily.        . metFORMIN (GLUCOPHAGE) 500 MG tablet Take 500 mg by mouth daily.        . Multiple Vitamin (MULTIVITAMIN) tablet Take 1 tablet by mouth daily.        . pioglitazone (ACTOS) 15 MG tablet Take 15 mg by mouth daily.        . ramipril (ALTACE) 10 MG tablet Take 10 mg by mouth 2 (two) times daily.       . simvastatin (ZOCOR) 20 MG tablet Take 20 mg by mouth at bedtime.          No Known Allergies  History   Social History  . Marital Status: Married    Spouse Name: N/A    Number of Children: N/A  . Years of Education: N/A   Occupational History  . 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 Smoking status: Former Smoker    Types: Cigarettes    Quit date: 10/30/1998  .  Smokeless tobacco: Not on file  . Alcohol Use: No  . Drug Use: No  . Sexually Active: Not on file   Other Topics Concern  . Not on file   Social History Narrative  . No narrative on file   Physical Exam: Filed Vitals:   11/16/10 1644  BP: 166/92  Pulse: 61  Height: 5\' 6"  (1.676 m)  Weight: 245 lb (111.131 kg)    GEN- The patient is well appearing, alert and oriented x 3 today.   Head- normocephalic, atraumatic Eyes-  Sclera clear, conjunctiva pink Ears- hearing intact Oropharynx- clear Neck- supple, no JVP Lymph- no cervical lymphadenopathy Lungs- Clear to ausculation bilaterally, normal work of breathing Chest- pacemaker pocket is well healed Heart- Regular rate and rhythm, no murmurs, rubs or gallops, PMI not laterally displaced GI- soft, NT, ND, + BS Extremities- no clubbing, cyanosis,  + chronic venous stasis changes with 1+ edema MS- s/p L knee surgery Skin- no rash or lesion Psych- euthymic mood, full affect  Neuro- strength and sensation are intact

## 2010-11-16 NOTE — Assessment & Plan Note (Signed)
Above goal today Salt restriction is advised He will journal his BP results and follow up with Dr Manuella Ghazi in several weeks. He may benefit from hctz if BP remains elevated at that time. He is reluctant to stop voltaren, though I suspect that edema would improve off NSAIDS

## 2010-12-13 NOTE — H&P (Signed)
NAMERAFID, WATT               ACCOUNT NO.:  [id number]   MEDICAL RECORD NO.:  [id number]          PATIENT TYPE:  INP   LOCATION:  NA                           FACILITY:  [facility]   PHYSICIAN:  [name], M.D.    DATE OF BIRTH:  08-10-1940   DATE OF ADMISSION:  10/16/2008  DATE OF DISCHARGE:                              HISTORY & PHYSICAL   CHIEF COMPLAINT:  Right hip pain.   HISTORY OF PRESENT ILLNESS:  The patient is 70 year old male who has  been seen by Dr. Wynelle Link for ongoing right hip pain.  He has had knee  pain and hip pain that has been ongoing for quite some time now.  He has  been treated conservatively for his knee and hip pain.  He has received  injections.  Unfortunately, injections in the knee including cortisone  had failed.  Unfortunately, the hip pain recently over this past year  has developed and gotten worse.  He has been seen in consultation by Dr.  Wynelle Link and found to have developed end-stage arthritis of the hip.  It  is felt he has reached the point where now he could benefit from  undergoing surgical intervention.  The risks and benefits were discussed  and he elected to proceed with surgery.   ALLERGIES:  NO KNOWN DRUG ALLERGIES.   CURRENT MEDICATIONS:  Actos, metformin, Amaryl, Niaspan, diclofenac,  atenolol, Altace, Crestor, aspirin, Norvasc, multivitamin.   PAST MEDICAL HISTORY:  1. Hypertension.  2. Coronary arterial disease.  3. Sleep apnea which he uses a CPAP.  4. Hypercholesterolemia.  5. Status post bypass surgery of four vessels.  6. Status post pacemaker placement.  7. Non-insulin-dependent diabetes mellitus.  8. History of renal calculi.   PAST SURGICAL HISTORY:  1. Coronary artery bypass grafting of four vessels.  2. Status post pacemaker placement.  3. He has undergone three back surgeries in January 2006, March 2007      and April 2009.  4. He also underwent a right knee scope about 20 years ago.   FAMILY HISTORY:  Father  with stroke.  Mother with heart attack.  Sister  with stroke.   SOCIAL HISTORY:  Married, Software engineer, past smoker, quit about 10 years  ago.  No alcohol.  Two children.  Spouse will be assisting with care  after surgery.  He has a two-level home.   REVIEW OF SYSTEMS:  GENERAL:  No fevers, chills or night sweats.  NEURO:  No seizure, syncope or paralysis.  RESPIRATORY:  No shortness breath,  productive cough or hemoptysis.  CARDIOVASCULAR:  No chest pain, angina  or orthopnea.  GI:  No nausea, vomiting, diarrhea or constipation.  GU:  No dysuria, hematuria or discharge.  MUSCULOSKELETAL:  Right hip and  knee.   PHYSICAL EXAMINATION:  VITAL SIGNS:  Pulse 64, respirations 16, blood  pressure 138/72.  GENERAL:  A 70 year old white male, well-nourished, well-developed in no  acute distress.  He is alert, oriented and cooperative.  Slightly  overweight.  Good historian.  HEENT:  Normocephalic, atraumatic.  Pupils are round and reactive.  EOMs  intact.  NECK:  Supple.  No bruits.  CHEST:  Clear anterior and posterior chest walls.  No rhonchi, rales or  wheezing.  HEART:  Regular rate and rhythm.  No murmur.  ABDOMEN:  Soft, round.  Bowel sounds present.  RECTAL/BREASTS/GENITALIA:  Not done, not pertinent to present illness.  EXTREMITIES:  Right hip flexion 90, zero internal rotation, 10 external  rotation, 20 abduction.  Right knee, no effusion, slight varus  malalignment and deformity.  Range of motion 5-115, marked crepitus  noted.   IMPRESSION:  Osteoarthritis right hip.   PLAN:  The patient admitted to [facility] to undergo a right  total replacement arthroplasty.  Surgery will be performed by [name].      Alexzandrew L. Perkins, P.A.C.      [name], M.D.  Electronically Signed    ALP/MEDQ  D:  10/15/2008  T:  10/15/2008  Job:  [id number]   cc:   [name], M.D.  Fax: [phone]   Deboraha Sprang, MD, [facility] [address]  [address]   [place]  [place] [address]   Monico Blitz, MD  Fax: [phone]

## 2010-12-13 NOTE — Op Note (Signed)
NAMEBOYSIE, LATINA               ACCOUNT NO.:  [id number]   MEDICAL RECORD NO.:  [id number]          PATIENT TYPE:  INP   LOCATION:  0002                         FACILITY:  [facility]   PHYSICIAN:  [name], M.D.    DATE OF BIRTH:  Jul 22, 1941   DATE OF PROCEDURE:  10/16/2008  DATE OF DISCHARGE:                               OPERATIVE REPORT   PREOPERATIVE DIAGNOSIS:  Osteoarthritis right hip.   POSTOPERATIVE DIAGNOSIS:  Osteoarthritis right hip.   PROCEDURE:  Right total hip arthroplasty.   SURGEON:  Dr. Wynelle Link   ASSISTANT:  Arlee Muslim, PA-C.   ANESTHESIA:  General.   ESTIMATED BLOOD LOSS:  500.   DRAIN:  Hemovac x1.   COMPLICATIONS:  None.   CONDITION:  Stable to recovery.   BRIEF CLINICAL NOTE:  Mr. Fuhrmeister is a 70 year old male with end-stage  arthritis of the right hip with progressively worsening pain and  dysfunction.  He has failed nonoperative management and presents now for  right total hip arthroplasty.   PROCEDURE IN DETAIL:  After the successful administration of general  anesthetic, the patient was placed in the left lateral decubitus  position with the right side up and held with the hip positioner.  The  right lower extremity isolated from his perineum with plastic drapes and  prepped and draped in the usual sterile fashion.  A short posterolateral  incision was made with a 10 blade through subcutaneous tissue to the  level of the fascia lata which was incised in line with the skin  incision.  The sciatic nerve was palpated and protected and the short  external rotators isolated off the femur.  A capsulectomy was performed  and the hip was dislocated.  The center of femoral head was marked and a  trial prosthesis was placed such that the center of the trial head  corresponded to the center of his native femoral head.  Osteotomy lines  were marked on the femoral neck and osteotomy made with an oscillating  saw.  The femoral head was removed and the  femur retracted anteriorly to  gain acetabular exposure.   Acetabular retractors were placed and the labrum and osteophytes  removed.  Acetabular reaming begins at 47 mm, coursing increments of 2-  55 mm and then a 56-mm pinnacle acetabular shell was placed in anatomic  position with excellent purchase and no need for additional screw  fixation.  The apex hole eliminator was placed and the 40 mm neutral  Ultramet liner was placed for a metal-on-metal hip replacement.   The femur was prepared the canal finder and irrigation.  Axial reaming  was performed to 13.5 mm, proximal reaming to an 65F and the sleeve  machine to a large.  An 65F large trial sleeve was placed and an 18 x 13  stem with a 36 +8 neck matching native anteversion.  A 40 +0 head was  placed and it reduces a little too easily, so I went to a 40 +2 which  had more appropriate soft tissue tension.  He had excellent stability  with full extension, full external  rotation, 70 degrees flexion, 40  degrees adduction, 90 degrees internal rotation, 90 degrees of flexion  and about 50 degrees of internal rotation.  By placing the right leg on  top of the left, I felt as though the leg lengths were equal.  The hip  was then dislocated and trials removed.  The permanent 65F large sleeve  was placed with the 18 x 13 stem and 36 +8 neck matching native  anteversion.  The 40 +3 head was placed and the hip was reduced with the  same stability parameters.  The wound was copiously irrigated saline  solution and short rotators reattached to the femur through drill holes.  The fascia lata was closed over a Hemovac drain with interrupted #1  Vicryl, subcu closed with #1 and #2-0 Vicryl and a subcuticular running  4-0 Monocryl.  The incision was cleaned and dried and Steri-Strips and a  bulky sterile dressing applied.  The drain was hooked to suction and he  was placed into a knee immobilizer, awakened and transported to recovery  in stable  condition.      [name], M.D.  Electronically Signed     FA/MEDQ  D:  10/16/2008  T:  10/16/2008  Job:  [id number]

## 2010-12-13 NOTE — Cardiovascular Report (Signed)
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 Lovena Le.  At that time he received a bipolar atrial lead and a unipolar  ventricular lead.  He has had no intercurrent problems.  The indication  was bradycardia.   He has ischemic heart disease.  He is status post revascularization via  bypass surgery.  He is having no complaints of chest pain or shortness  of breath.  I should note that there is a prior myocardial infarction  and his most recent ejection fraction was 50 some odd percent in June  2005.   His lipids are currently being managed by Dr. Manuella Ghazi.   His medications include  1. Altace 10.  2. Atenolol 50.  3. Zocor.  4. Imdur 30.  5. Actos 15.  6. Metformin 500 b.i.d.  7. Amaryl 2.  8. Niaspan 500.   PHYSICAL EXAMINATION:  VITAL SIGNS:  His blood pressure is quite  elevated at 145/93 now after 5 minutes, initially it was 167/97.  LUNGS:  Clear.  HEART:  Sounds were regular.  NECK:  Veins were flat.  EXTREMITIES:  There is 1 plus edema.  SKIN:  Warm and dry.   Interrogation of his Medtronic [employer] Generator demonstrates  battery voltage of 2.78 with an estimated 5 years' of longevity.  The P  wave was 2.8 with impedance of 360, a threshold of 1 volt at 0.4.  The R  wave was 8 with impedance of 472, a threshold of 0.5.  He is atrially  paced only 4% of the time.   IMPRESSION:  1. Bradycardia.  2. Status post pacemaker for the above.  3. Ischemic heart disease.      a.     Status post coronary artery bypass graft.      b.     Status post myocardial infarction.      c.     Ejection fraction 55% (June 2005).  4. Hypertension,  inadequately controlled.  5. Diabetes.  6. Dyslipidemia, question status.   I have given Mr. Stellmacher today targets for his blood pressure and his LDL.  He is to follow up with Dr. Manuella Ghazi about this.  It may well be that adding  a diuretic to an ACE inhibitor would be appropriate and potentially a  combination __________ be useful.   We will see him again in 6 months' time in the clinic and he will  continue on transtelephonic monitoring in the interim.     Deboraha Sprang, MD, [facility]  Electronically Signed    SCK/MedQ  DD: 08/09/2007  DT: 08/09/2007  Job #: [id number]   cc:   Monico Blitz, MD

## 2010-12-13 NOTE — Letter (Signed)
September 14, 2008    [name], MD  [facility]  [address]  [address]  [place], [place] [address]   RE:  [name], [name]  MRN:  [id number]  /  DOB:  1940-08-18   Dear Pilar Plate,   I hope this letter finds you well.  [name] is seen today in  anticipation of hip replacement.  He is anxiously awaiting relief of his  knee pain.   As you know, he has ischemic heart disease.  He is status post  revascularization some years ago.  He underwent Myoview scanning that  was negative in 2008 prior to a neurosurgical procedure.  That was  negative.   He has had no intercurrent change in functional status, apart from his  hip and knee.   He has a pacemaker in for bradycardia.   His lipids are being closely managed with a recent introduction of  Crestor to try to help with his hypertriglyceridemia.   OTHER MEDICATIONS:  1. Niaspan 1000.  2. Diclofenac.  3. Amaryl 2.  4. Metformin 500 b.i.d.  5. Actos 15.  6. Altace 10.  7. Atenolol 50.  8. Aspirin 325.  9. Norvasc 5.  10.Fish oil.   ALLERGIES:  He has no known drug allergies.   REVIEW OF SYSTEMS:  His review of systems across multiple organ systems  is broadly negative apart from the arthritis, prompting his surgery and  obesity.   SOCIAL HISTORY:  He is married.  He has 2 children and stepchildren as  well.  He does not use cigarettes, alcohol, or recreational drugs.   PHYSICAL EXAMINATION:  GENERAL:  His is an older Caucasian male  appearing somewhat younger than his stated age of 71.  VITAL SIGNS:  His blood pressure was elevated today to 172/91 with a  pulse of 64.  His weight was 242 pounds, which is stable.  HEENT:  No icterus.  No xanthoma.  NECK:  His neck veins were flat.  The carotids are brisk and full  bilaterally out bruits.  BACK:  Without kyphosis or scoliosis.  LUNGS:  Clear.  HEART:  Sounds were regular with an S4.  ABDOMEN:  Soft with active bowel sounds without midline pulsation or  hepatomegaly.  EXTREMITIES:  Femoral pulses were 2+.  Distal pulse was present on the  left, but I could not feel it on the right.  There is no clubbing or  cyanosis.  There is lower edema on the right.  NEUROLOGIC:  Grossly normal.  SKIN:  Warm and dry.   Electrocardiogram dated today demonstrated sinus rhythm at 63 with  intervals of 0.17/0.08/0.40.  The axis was 55 degrees.   IMPRESSION:  1. Bradycardia.  2. Status post Medtronic pacemaker for bradycardia.  3. Ischemic heart disease with:      a.     Status post coronary artery bypass graft in 2000.      b.     Negative Myoview scan in 2008.      c.     No changes in functional status.  4. Hypertension - poorly controlled.  5. Diabetes.  6. Impending orthopedic surgery.   Pilar Plate, [name] should be at acceptable risk for his orthopedic  procedure.  We would be glad to be available as needed.   He may need attention to his blood pressure.  I have asked him to follow  up with Dr. Manuella Ghazi about this.   As relates to his pacemaker, he is not  device-dependent, so there should  be no worries about electromagnetic interference with his device at the  time of his orthopedic procedure.   Please let us know, if there is any we can do to help.    Sincerely,      Deboraha Sprang, MD, [facility]  Electronically Signed    SCK/MedQ  DD: 09/14/2008  DT: 09/15/2008  Job #: [id number]

## 2010-12-13 NOTE — Progress Notes (Signed)
[name]                        PERIPHERAL VASCULAR OFFICE NOTE   NAME:[name], [name]                      MRN:          [id number]  DATE:10/09/2008                            DOB:          1941-03-03    REASON FOR EVALUATION:  Lower extremity peripheral arterial disease.   HISTORY OF PRESENT ILLNESS:  [name] is a 70 year old gentleman with  chronic leg pain.  He has severe osteoarthritis and is scheduled for  upcoming right hip replacement.  He complains of chronic right hip and  knee pain.  His pain occurs with walking as well as lying in the supine  position.  He ambulates with the use of a walker.  He denies calf or  thigh muscle pain.  He has chronic right lower leg swelling.  He has had  no ischemic ulcerations.  Because of an abnormal pulse exam, he  underwent lower extremity physiologic study that demonstrated an ABI on  the left above 1 and an ABI on the right at 0.84.  There was pressure  gradient between right upper and lower thigh suggestive of superficial  femoral disease.   PAST MEDICAL HISTORY:  1. CAD, status post CABG.  2. Symptomatic bradycardia, status post permanent pacemaker.  3. Spinal stenosis.  4. Severe osteoarthritis.  5. Hypertension.  6. Type 2 diabetes.  7. Dyslipidemia.   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  He retired in 2007.  He smoked cigarettes until  2000.  No history of alcohol abuse.   FAMILY HISTORY:  Father died at 45 from a stroke.  Mother died at 101 of  a chronic kidney disease and hypertension.   REVIEW OF SYSTEMS:  A 10-point review of systems was performed.  Pertinent positives included venous stasis ulcer on the left leg, low  back pain, right hip, and knee pain.  All other systems were negative  except as outlined in the HPI.   CURRENT MEDICATIONS:  1. Crestor 10 mg daily.  2. Norvasc 5 mg daily.  3. Multivitamin 1 daily.  4.  Altace 10 mg daily.  5. Atenolol 50 mg daily.  6. Aspirin 325 mg daily.  7. Actos 15 mg daily.  8. Metformin 500 mg b.i.d.  9. Amaryl 2 mg daily.  10.Niaspan 1 g daily.  11.Diclofenac twice daily.   ALLERGIES:  NKDA.   PHYSICAL EXAMINATION:  GENERAL:  The patient is alert and oriented,  obese male, in no acute distress.  VITAL SIGNS:  Weight is 240 pounds, blood pressure 110/70 in the right  arm, 114/70 in the left arm, heart rate 76, respiratory rate 16.  HEENT:  Normal.  NECK:  Normal carotid upstrokes, no bruits.  JVP normal.  No thyromegaly  or thyroid nodules.  LUNGS:  Clear bilaterally.  HEART:  The apex is not palpable.  Regular rate and rhythm.  No murmurs  or gallops are present.  ABDOMEN:  Soft, obese, nontender, no organomegaly.  No abdominal bruits.  BACK:  No CVA tenderness.  EXTREMITIES:  Femoral pulses are 2+  bilaterally.  There are no bruits.  There is 1+ edema of the right lower leg.  Trace edema on the left.  SKIN:  Warm and dry with a healing shallow ulceration in the left shin  area with a clean base.  Pedal pulses on the left are 2+, on the right  there are trace.  Skin is warm and dry.  There are no areas of skin  breakdown except as outlined.  NEUROLOGIC:  Cranial nerves II through XII are intact.  Strength intact  and equal bilaterally.   ASSESSMENT:  This is a 70 year old gentleman with lower extremity PAD as  outlined.  The patient is so limited from his osteoarthritis that  ischemic leg symptoms are not manifest.  I suspect if he was able to be  more active, he may have some claudication.  However, his arterial  occlusive disease appears to be fairly mild.  Recommend observation with  medical management.  The patient is on an excellent risk reduction  program with aggressive treatment of his hypertension and diabetes.  He  should continue on aspirin.  I do not see any indication for Pletal  since he is not having ischemic leg symptoms.   I would be  happy to see [name] in followup on a p.r.n. basis, should  he develop any symptoms of limb ischemia.   I appreciate the opportunity to participate in the care of this very  nice gentleman.     Juanda Bond. Burt Knack, MD  Electronically Signed    MDC/MedQ  DD: 10/19/2008  DT: 10/20/2008  Job #: [id number]   cc:   Deboraha Sprang, MD, [facility]  [name], M.D.  Monico Blitz, MD

## 2010-12-13 NOTE — Op Note (Signed)
NAMEESTHER, [name]               ACCOUNT NO.:  [id number]   MEDICAL RECORD NO.:  [id number]          PATIENT TYPE:  INP   LOCATION:  3001                         FACILITY:  [facility]   PHYSICIAN:  Otilio Connors, M.D.  DATE OF BIRTH:  1941-07-05   DATE OF PROCEDURE:  11/26/2007  DATE OF DISCHARGE:                               OPERATIVE REPORT   PREOPERATIVE DIAGNOSES:  Thoracic stenosis, spondylosis with myelopathy,  prior surgery, T7-T11.   POSTOPERATIVE DIAGNOSES:  Thoracic stenosis, spondylosis with  myelopathy, prior surgery, T7-T11.   PROCEDURE:  T7-T11 (5 levels) redo decompressive laminectomies, EXPEDIUM  pedicle screw fixation segmented T7-T11, posterolateral fusion T7-T11 (4  levels), autograft through same  incision, Infuse BMP.   SURGEON:  Otilio Connors, MD   ASSISTANT:  Marchia Meiers. Vertell Limber, MD   ANESTHESIA:  General endotracheal tube anesthesia.   BLOOD LOSS:  350 mL, therefore we have given 150 mL of Cell Saver  return.   DRAINS:  None.   COMPLICATIONS:  None.   REASON FOR PROCEDURE:  The patient is a 70 year old gentleman who has  undergone a T10 through sacrum decompression and fusion, and had done  well but reported the last few months, has had progressive gait  disturbance, found to be myelopathic on exam and CT.  The thoracic  and  lumbar area shows there is significant spondylitic change and stenosis  from T7 through T11.  The patient was brought in for redo decompressive  laminectomy and fusion.   PROCEDURE IN DETAIL:  The patient was brought to the operating room.  General anesthesia was induced.  The patient was placed in the prone  position on  the Wilson frame with all pressure points padded.  The  patient was prepped and draped in sterile fashion.  He had his incision  injected with 20 mL of 1% lidocaine with epinephrine.  An incision was  then made from the top of the previous incision in the midline thoracic  spine and extended cephalad, the  incision was taken down to the fascia.  Hemostasis obtained with Bovie cauterization.  The spinous processes  from T6 down to T11 were exposed with subperiosteal dissection.  The  exposure went out laterally to find the transverse process bilaterally  in the upper thoracic area and to the pedicle screws that started at T10  and covered the T10 and T11 screws and rods.  There was significant scar  and fusion mass from T10 down in the midline.  We had good subperiosteal  dissection of that.  We took x-rays to confirm our positioning in the  bottom of T7 followed by T8, T9, T10, and top of T11 were removed using  Leksell rongeurs, Kerrison punches, and a high-speed drill to do  decompressive laminectomy decompressing the thoracic spinal cord.  Dissection was taken out laterally to just medial to the pedicle  throughout.  When we were finished we had good decompression of the  central canal.  Hemostasis was obtained with Gelfoam and thrombin.  Attention now was given to the pedicle entry points at T9 through T7  starting  at the right side using fluoroscopy and intraoperative  landmarks as a guide.  The __________  point was decorticated with high-  speed drill, pedicle probe placed on the pedicle, checked with a small  bone probe making sure we had bony circumference, tapped the hole and  placed a screw.  This was repeated at the other 2 sites and in the  contralateral sides where 6 screws were placed from T8 and T7  bilaterally; 5-mm diameter x 45-mm screws were used at T9 and T8 and  4.35 wide x 40-mm screws were used at T7.  We further explored ways to  try to  __________ rod into the rod system that was already from T10  down.  There was new __________ use of end-to-end or a side connector.  High-speed drill was used to cut the rod that was there previously.  We  made 1 cut between T10 and T11 on one side and on the contralateral side  we made a cut between T11-T12.  We removed the locking  nuts and rods and  the upper end of the 3 screws and then using new rod placed in the screw  heads from T7 to T11 on one side and T7 to T10 on the opposite side.  New locking nuts were placed and these were finally tightened.  Cross  connector was placed between the 2 rods at the T8-T9 air level.  This  was tightened down.  We decorticated the posterolateral gutters  bilaterally and then then we packed  the posterolateral  gutters with  Infuse BMP and autograft bone graft.  We removed the Gelfoam that was  over the dura with good hemostasis.  We irrigated with antibiotics  solution, the retractors were removed and fascia closed with 0 Vicryl  interrupted sutures, subcutaneous tissue closed with 2-0 and 3-0 Vicryl  interrupted sutures.  Skin was closed with benzoin, and Steri-Strips  dressing was placed.  The patient was placed back in supine position,  awakened from anesthesia, and transferred to recovery room in stable  condition.           ______________________________  Otilio Connors, M.D.     JRH/MEDQ  D:  11/26/2007  T:  11/27/2007  Job:  [id number]

## 2010-12-13 NOTE — Discharge Summary (Signed)
[name], [name]               ACCOUNT NO.:  [id number]   MEDICAL RECORD NO.:  [id number]          PATIENT TYPE:  INP   LOCATION:  3001                         FACILITY:  [facility]   PHYSICIAN:  Marchia Meiers. Vertell Limber, M.D.  DATE OF BIRTH:  05-20-1941   DATE OF ADMISSION:  11/26/2007  DATE OF DISCHARGE:  12/01/2007                               DISCHARGE SUMMARY   REASON FOR ADMISSION:  Thoracic stenosis with spondylosis and thoracic  myelopathy with prior surgery at T7 through T11.   FINAL DIAGNOSES:  Thoracic stenosis with spondylosis and thoracic  myelopathy with prior surgery at T7 through T11.   PROCEDURES:  Hospitalization with redo decompression and fusion T7  through T11 levels.   HISTORY OF PRESENT ILLNESS AND HOSPITAL COURSE:  [name] is a 70-  year-old man who has previously undergone extensive thoracolumbar  decompression and fusion by Dr. Hazle Coca who was found to have  progressive myelopathy and cord compression.  He underwent redo  decompression and fusion, T7 through T11 levels.  He did well with  surgery.  He was gradually mobilized and wearing a back brace.  He was  working with physical therapy on Nov 29, 2007, and Nov 30, 2007, with  [facility] with expectations that he would go home with  home physical therapy on 3 times a week basis.   PREOPERATIVE MEDICATIONS:  1. Actos 15 mg daily.  2. Metformin 500 mg twice daily.  3. Amaryl 2 mg daily for diabetes.  4. Niaspan 1000 mg daily.  5. He is to stop his diclofenac.  6. He is to take atenolol 50 mg daily.  7. Altace 10 mg daily.  8. Simvastatin 20 mg daily.  9. Imdur 30 mg daily.  10.Aspirin 325 mg daily.  11.Norvasc 5 mg daily.  12.His additional medication prescriptions were written for Percocet      5/325 one to two every 4 hours as needed for pain, dispensed #80  13.Also, for Robaxin 750 mg 1 every 6-8 hours as needed for muscle      spasm, dispensed #60 with 1 refill.   He is  instructed to wear his back brace when up.  To follow up with Dr.  Luiz Ochoa in 3 weeks with x-rays in the office.     Marchia Meiers. Vertell Limber, M.D.  Electronically Signed    JDS/MEDQ  D:  12/01/2007  T:  12/02/2007  Job:  [id number]

## 2010-12-16 NOTE — Discharge Summary (Signed)
NAMEINDERPAL, MATKIN               ACCOUNT NO.:  [id number]   MEDICAL RECORD NO.:  [id number]          PATIENT TYPE:  INP   LOCATION:  1618                         FACILITY:  [facility]   PHYSICIAN:  [name], M.D.    DATE OF BIRTH:  05-08-1941   DATE OF ADMISSION:  10/16/2008  DATE OF DISCHARGE:  10/19/2008                               DISCHARGE SUMMARY   ADMITTING DIAGNOSES:  1. Osteoarthritis, right hip.  2. Hypertension.  3. Coronary arterial disease.  4. Sleep apnea, uses CPAP.  5. Hypercholesterolemia.  6. Status post bypass grafting, 4 vessels.  7. Status post pacemaker placement.  8. Non-insulin-dependent diabetes mellitus.  9. History of renal calculi.   DISCHARGE DIAGNOSES:  1. Osteoarthritis, right hip, status post right total hip replacement      arthroplasty.  2. Mild postop hyponatremia.  3. Hypertension.  4. Coronary arterial disease.  5. Sleep apnea, uses CPAP.  6. Hypercholesterolemia.  7. Status post bypass grafting, 4 vessels.  8. Status post pacemaker placement.  9. Non-insulin-dependent diabetes mellitus.  10.History of renal calculi.   PROCEDURE:  October 16, 2008, right total hip.  Surgeon, Dr. Wynelle Link.  Assistant, Arlee Muslim, P.A.-C.  Anesthesia, general.   CONSULTS:  None.   BRIEF HISTORY:  Mr. Pickron is a 70 year old male with end-stage arthritis  of the right hip, progressive worsening pain and dysfunction, failed  nonoperative management, now presents for total hip arthroplasty.   LABORATORY DATA:  Preop CBC showed a hemoglobin of 12.9, hematocrit of  38.2, white cell count 7.4, platelets 227.  Postop hemoglobin 10.4, then  1,0 came back a little bit, last noted H and H 10.1 and 29.8.  PT/PTT  preop 14.4 and 34 respectively.  INR 1.1.  Serial pro times followed per  Coumadin protocol.  Last noted PT/INR 17.3 and 1.4.  Chem panel on  admission all within normal limits with the exception of minimally  elevated ALP, high normal of 118.  Sodium  dropped from 142 to 130, back  up to 132 on the serial BMETs.  Preop UA was negative.  Blood group type  O+.  Hip film, October 09, 2008, no acute fracture or subluxation,  extensive degenerative changes in the right hip joint.   EKG, November 26, 2007, electronic atrial pacer, nonspecific ST abnormality  when compared to October 04, 2004, pacing is new, confirmed by Dr. Leslye Peer.   HOSPITAL COURSE:  Patient admitted to [facility], taken to the  OR, underwent above-said procedure without complication.  Patient  tolerated procedure well, later transferred to recovery room, orthopedic  floor, started on PCA and p.o. analgesic pain control following surgery.  Started back on his home meds, given Coumadin for DVT prophylaxis.  Doing pretty well.  Hip pain had already improved on the morning of day  1.  We discontinued the PCA later that day.  He had a little bit of  hyponatremia, so we discontinued his fluids once he was eating and  drinking well.  Started getting up out of bed with therapy.  By day 2,  he was  doing very well.  Eating and drinking well.  Discontinued the  fluids.  His sodium which was down on the morning of day 1 was already  back up by day 2.  Dressing change, incision looked good.  Continued to  progress well with therapy, walking over 60 feet.  By day 3, he was  doing well and was ready to go home.   DISCHARGE PLAN:  1. Patient discharged home on October 19, 2008.  2. Discharge diagnoses:  Please see above.  3. Discharge meds:  Percocet, Robaxin, Lovenox, and Coumadin.   DIET:  Heart-healthy diet.   ACTIVITY:  Partial weightbearing, 25% to 50%, right leg.  Hip  precautions, total hip protocol,  Home health PT.  Home health nursing.   FOLLOWUP:  Two weeks.   DISPOSITION:  Home.   CONDITION UPON DISCHARGE:  Improving.      Alexzandrew L. Perkins, P.A.C.      [name], M.D.  Electronically Signed    ALP/MEDQ  D:  11/05/2008  T:  11/05/2008   Job:  [id number]   cc:   [name], M.D.  Fax: [phone]   Deboraha Sprang, MD, [facility] [address]  [address]  [place]  [place] [address]   Monico Blitz, MD  Fax: [phone]

## 2010-12-16 NOTE — Assessment & Plan Note (Signed)
[name]                            CARDIOLOGY OFFICE NOTE   NAME:[name], [name]                      MRN:          [id number]  DATE:08/13/2006                            DOB:          07-20-1941    CARDIOLOGIST:  Dr. Virl Axe   PRIMARY CARE PHYSICIAN:  Dr. Monico Blitz in [place]   HISTORY OF PRESENT ILLNESS:  [name] is a 70 year old male patient  followed by Dr. Caryl Comes with a history of coronary artery disease, status  post CABG in 2000 with LIMA to the LAD, saphenous vein graft to the OM1  and OM2 and posterior descending, who is status post pacemaker  implantation, post bypass secondary to significant bradycardia. He has  spinal stenosis and is in need of surgery. This is scheduled next week  with Dr. Hazle Coca. He is seen in the office today for preoperative  clearance. The patient has overall been doing well without any  complaints of chest pain, or shortness of breath.  He denies any syncope  or palpitations. Denies any orthopnea, or paroxysmal nocturnal dyspnea.  Denies any lower extremity edema. His activity is somewhat limited by  his back pain which has caused weakness in his legs.   CURRENT MEDICATIONS:  1. Altace 10 mg daily.  2. Atenolol 50 mg daily.  3. Imdur 30 mg day.  4. Aspirin 325 mg daily.  5. Glucosamine.  6. Multivitamin.  7. Fish oil.  8. Zocor 20 mg q.h.s.  9. Metformin 500 mg 2 tablets b.i.d.  10.Actos 30 mg daily.   ALLERGIES:  NO KNOWN DRUG ALLERGIES.   SOCIAL HISTORY:  The patient quit smoking in 2000, denies any alcohol  abuse. He is a retired Software engineer.   FAMILY HISTORY:  Negative for coronary artery disease. His mother had a  myocardial infarction at age 24. She is deceased. His father died at age  30 from a stroke.   REVIEW OF SYSTEMS:  Please see HPI. Denies any fevers, chills, cough,  melena, hematochezia, hematuria, dysuria. Denies any hemoptysis. Rest of  review of systems are  negative.   PHYSICAL EXAMINATION:  He is a well-nourished, well-developed male in no  distress.  Blood pressure 120/72, pulse 57, weight 242.8 pounds.  HEAD: Normocephalic, atraumatic.  EYES: PERRLA, EOMI. Sclerae clear.  OROPHARYNX: Pink without exudate.  LYMPH: Without lymphadenopathy.  ENDOCRINE: Without thyromegaly.  CAROTIDS: Without bruits bilaterally.  CARDIAC: Normal S1, S2, regular rate and rhythm without murmurs.  LUNGS: Clear to auscultation  bilaterally, without wheezing, rhonchi, or  rales.  ABDOMEN: Soft, nontender, with normoactive bowel sounds, no  organomegaly.  EXTREMITIES: With trace to 1+ edema bilaterally. Calves are soft  nontender.  SKIN: Warm and dry.  NEUROLOGIC: He is alert and oriented x3, cranial nerves II-XII grossly  intact.   Electrocardiogram reveals sinus rhythm with a heart rate of 57, normal  axis, no acute changes. There are no old tracings to compare.   Interrogation of the patient's Medtronic pacemaker today reveals that it  is functioning appropriately. He is not pacemaker dependent at this  point in time.  IMPRESSION:  1. Coronary artery disease, status post coronary artery bypass      grafting in 2000, graft as noted above.  2. History of good left ventricular function.  3. Status post pacemaker implantation secondary to significant      bradycardia post bypass surgery.  4. Diabetes mellitus type 2.  5. Hypertension.  6. Hyperlipidemia.  7. Status post spine surgery.  8. Spinal stenosis - needs lower thoracic and upper lumbar spine      surgery August 20, 2006.  9. History of peripheral arterial disease.      a.     Ankle-brachial indices June of 2005, 0.88 on the right, 1.1       on the left - previously seen by Dr. Albertine Patricia- conservative       management.   The patient presents to the office today for preoperative clearance  prior to his upcoming spinal surgery. He is doing well from a  cardiovascular standpoint without any  symptoms of angina. His pacemaker  is functioning appropriately and he is currently not pacemaker dependent  at this time. Prior to clearing him for surgery he will need to  undertake an adenosine Myoview study. He lives in [place] and we will try  to get this arranged in the [place] office in the next couple of days. As  long as his Myoview scan is low risk, we should be able to clear him for  surgery. I have asked him to continue his beta blockers through the  perioperative period. Postoperatively the representative for the  patient's pacemaker can be contacted to interrogate his device, to  ensure  that it is still functioning appropriately. As noted above he does have  a Medtronic device. Our service will certainly be available in the  perioperative period as necessary.      Richardson Dopp, PA-C  Electronically Signed      Fay Records, MD, [facility]  Electronically Signed   SW/MedQ  DD: 08/13/2006  DT: 08/13/2006  Job #: [id number]   cc:   Monico Blitz, MD

## 2010-12-16 NOTE — Op Note (Signed)
[name], [name]               ACCOUNT NO.:  [id number]   MEDICAL RECORD NO.:  [id number]          PATIENT TYPE:  INP   LOCATION:  3109                         FACILITY:  [facility]   PHYSICIAN:  Otilio Connors, M.D.  DATE OF BIRTH:  Jan 17, 1941   DATE OF PROCEDURE:  08/20/2006  DATE OF DISCHARGE:                               OPERATIVE REPORT   DIAGNOSES:  Lumbar stenosis, spondylosis, instability, herniated nucleus  pulposus, with prior surgery and prior fusion.   POSTOPERATIVE DIAGNOSES:  Lumbar stenosis, spondylosis, instability,  herniated nucleus pulposus, with prior surgery and prior fusion.   PROCEDURES:  Redo decompressive laminectomy, T11 through L4 (5 levels);  posterior lumbar interbody fusion at L2-3 and L3-4; interbody Saber  cages placed at L2-3 and L3-4; exploration of prior fusion from L4 to  S1; pedicle-screw fixation, segmented, with Expedium screws from T10  through S1; posterolateral fusion, T10 to S1 (8 levels; augmented prior  fusion L4-5 and 5-1, and did replace S1 screws); autograft, same  incision, allograft, Infuse bone morphogenic protein.   SURGEON:  Otilio Connors, M.D.   ASSISTANT:  Hosie Spangle, M.D.   ANESTHESIA:  General endotracheal anesthesia.   ESTIMATED BLOOD LOSS:  1500 mL.   BLOOD GIVEN:  900 mL Cell Saver replaced.   COMPLICATIONS:  None.   DRAINS:  None.   REASON FOR PROCEDURES:  The patient is a 70 year old gentleman, who has  had 2-level fusion for stenosis and spondylolisthesis and instability at  4-5 and 5-1 two years ago.  He did well for a while, but over the last 6  months or so, has been having more back pain, leg weakness and numbness,  especially on the right.  Myelogram was done, showing severe stenosis at  L3-4, with stenosis and disk herniation on the right side at 2-3, with  severe spondylytic changes at T11-12 with stenosis at those levels also.  The patient is brought for redo decompressive laminectomy and  fusion.  He is also for replacement of the right-sided S1 screws.  I am going to  explore the prior fusion, as the myelogram and CT pictures done did not  show full fusion of the interbody spacers.   PROCEDURES IN DETAIL:  The patient was brought to the operating room,  general anesthesia was induced and the patient was placed in a prone  position on the Wilson frame with all pressure points padded.  The  patient was prepped and draped in sterile fashion, and the site of  incision was injected with 20 mL of 1% lidocaine with epinephrine.  An  incision was then made at the site of the previous scar in the lower  lumbar spine.  This incision was extended cephalad.  The incision was  taken down to the fascia and hemostasis was obtained with Bovie  cauterization.  To the last couple spinous processes, we did  subperiosteal dissection over these and actually found one lamina and  residual spinous process even below that as we started our dissection.  That was left in the L4 spinous process and lamina, and then, again, we  did some periosteal dissection from L1 through 4 to now have the  laminectomy defect.  There was a cyst that we ran into as we got through  the fascia at that area so we could see down to some scar right over the  dura, and then we could dissect out laterally easily over the facets.  Then, the pedicle screws were carefully dissected and dissected so that  we could see the screws at 4, 5 and S1.  We continued our lateral  dissection cephalad so we could see the transverse processes at 4, 3, 2  and 1.  We irrigated with antibiotic solution.  We then removed the  locking nuts and rods from our screws.  We dissected out in the lateral  and posterolateral gutters, and there was a lot of loose allograft noted  in the posterolateral gutters and we removed this.  The S1 screw on the  right did appear loose and we removed both S1 screws.  We reached out  and then placed an 8-mm screw  on the right side and a 7-mm screw on the  left side.  We had 6-mm screws in.  Both screws were then very tight.  We decorticated the lateral sacrum and the transverse processes of L4  and 5.  We prepared this all for repeat augmentation and posterolateral  fusion of these levels.  We then started the decompression with a  laminectomy.  We dissected carefully between the scar and what was left  of the L4 lamina, and worked our way through the scar tissue and removed  the L4 lamina, the L3 lamina, and most of the L2 lamina.  Then, we went  out the far lateral side and did a complete decompression laterally, and  the hypertrophic facets and medial facetectomies were done, and a  complete facetectomy on the right side was done at 3-4 into the 2-3  level.  We explored the disk space and disk bulging and we incised the  disk space at 3-4 and performed diskectomy bilaterally, and then  prepared the interbody space for fusion using the broaches for the Saber  cages.  We distracted the interspace up to 11 mm and prepared the  interspace well, holding the distraction on one side, while we packed  the interspace with autograft bone.  All this was bone removed during  the laminectomy, cleaned from its soft tissue, cut into small pieces.  We then impacted Saber cages, 11 high x 9 wide, with Infuse BMP and the  autograft.  We tapped 1 cage into place, removed the distraction on the  other side and tapped a second cage into place at the 3-4 level.  Attention was then taken to the 2-3 disk space on the right side.  Exploring the disk space, found a disk herniation with some free  fragments.  We removed those and decompressed the nerve root.  We found  a far lateral disk herniation, so we did more of a facetectomy and made  sure that the 2 nerve root was decompressed both posteriorly, and  removed the disk herniation that was up underneath it, decompressing it from the anterior side.  We then distracted  the interspace up to 9 mm,  prepared the interspace for posterolateral interbody fusion with Saber  cages using the various approaches, and we packed the interspace with  autograft bone, packed one 9 x 9-mm cage with Infuse BMP and autograft  bone, and then tapped it into place, and  once we got it in the space, we  angled it more medially and tried to tap the anterior part across the  midline somewhat because we were only going to put 1 cage in at this  level.  The cages were in good position.  We had good decompression of  the nerve roots.  We then continued with a decompressive laminectomy at  T11 through the L2 area, just on the left side.  We went from the  midline down to the facets to get a good decompression on the right side  of the canal for the stenosis that was there.  This was done with a high-  speed drill, and all the bone dust was saved in a Lukens trap and used  later in the case for fusion,  Kerrison punches were used to finish the  decompressive laminectomy.  So, now we did laminectomies from T11 all  the way through L4, with a posterolateral interbody fusion and interbody  cages at the 2-3 and 3-4 levels.  We again explored all the dura and  nerve roots and we had good decompression.  We then used fluoro and  intraoperative landmarks to help guide pedicle-screw fixation.  We used  a high-speed drill to decorticate, after finding the entry point using  fluoro and the using landmarks.  We placed a pedicle probe down the  pedicle, tapped the pedicle and then placed a screw.  We used 5-mm wide  screws at T10, and at all the rest of the levels, 6-mm diameter screws.  This process was then done at T11 bilaterally, T12 bilaterally, L1, L2  and L3 bilaterally.  We decorticated the transverse processes of L1, L2,  L3 and L4 with a high-speed drill, and the lateral facets bilaterally,  and then decorticated the laminae and facets of T10 through T12,  including the one on the left  side, where the laminectomy was not done.  We then used a template to guide [place] in cutting and forming a rod.  We  first did this on the right side.  We cut the rod to size, bent it with  benders, got the rod into the screw heads and then placed the locking  caps.  This was repeated for the opposite.  We then final tightened each  of the caps from T10 through S1 bilaterally for the rod system.  A/P and  lateral fluoroscopic images were obtained after this, and we placed 2  cross-connectors to help stabilize the construct even more.  We then  placed Infuse BMP in the posterolateral gutters bilaterally, and over  the laminae on the left from T10 through S1.  The rest of the autograft  bone that was removed during all the laminectomies was then packed in  the posterolateral gutters and laterally in the conduits.  Allograft  bone substitute was mixed with the bone dust from the drilling and some blood, and this mixture was also laid in the posterolateral gutters for  posterolateral fusion, again, from T10 through S1.  We then explored the  dura and no more bone chips were found, so we placed Gelfoam over the  dura so bone chips would not fall onto the dura from the thoracic area  or into the lateral gutters in the lumbar area.  Retractors were  removed.  We had good hemostasis, and the fascia was closed with 1 and 0  Vicryl interrupted sutures, subcutaneous tissues closed with 0, 2-0 and  3-0 Vicryl interrupted sutures,  and the skin was closed with staples.  A  dressing was placed.  The patient was placed back into a supine  position, awakened from anesthesia and transferred to recovery in stable  condition.           ______________________________  Otilio Connors, M.D.     JRH/MEDQ  D:  08/20/2006  T:  08/20/2006  Job:  [id number]

## 2010-12-16 NOTE — Op Note (Signed)
[name], [name]               ACCOUNT NO.:  [id number]   MEDICAL RECORD NO.:  [id number]          PATIENT TYPE:  INP   LOCATION:  5148                         FACILITY:  [facility]   PHYSICIAN:  Otilio Connors, M.D.  DATE OF BIRTH:  [birth year]   DATE OF PROCEDURE:  10/06/2004  DATE OF DISCHARGE:                                 OPERATIVE REPORT   DIAGNOSES:  1.  Spondylosis and stenosis, L4-5, unstable.  2.  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   POSTOPERATIVE DIAGNOSES:  1.  Spondylosis and stenosis, L4-5, unstable.  2.  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   PROCEDURE:  L5-S1 Gill decompressive laminectomy, posterior lumbar interbody  fusion procedure at L4-5 and L5-S1 with Brantigan interbody cages at L4-5  and L5-S1, segmental pedicle screw fixation at L4 through S1, posterolateral  fusion, L4 through S1 (__________ ), autograft, allograft, Symphony.   SURGEON:  Otilio Connors, M.D.   ASSISTANT:  Ophelia Charter, M.D.   ANESTHESIA:  General endotracheal tube anesthesia.   ESTIMATED BLOOD LOSS:  150 mL.   BLOOD GIVEN:  200 mL of Cell Saver returned.   COMPLICATIONS:  None known.   REASON FOR PROCEDURE:  The patient is a 70 year old gentleman who has had  back and leg pain with trouble walking and neurogenic claudication.  He was  worked up with a myelogram for unstable spondylolisthesis at 5-1, severe  stenosis at 4-5, multiple changes.  The patient was brought in for  decompression and fusion at 4-5 and 5-1.   PROCEDURE IN DETAIL:  The patient was brought into the operating room and  general anesthesia was induced.  The patient was placed in a prone position  and all pressure points padded.  The patient was prepped and draped in a  sterile fashion after 20 mL of 1% lidocaine were injected into the site of  incision.  Incision was then made from the spinous process of L3 through the  sacrum  and incision taken down to the fascia and hemostasis obtained Bovie  cauterization.  The fascia was incised with the Bovie and subperiosteal  dissection was done over the spinous process of L3-4 and at 5 and S1 on the  left side.  Dissection was taken out to the facets.  Fluoroscopy was brought  in to confirm our positioning with markers at the pedicles of L4-5 and 1  would be and this corresponded to our x-ray; fluoroscopy agreed with our  positioning.  We continued with the dissection to the right side, exposing  all the way to the transverse process of L4, L5 and lateral sacrum.  Self-  retaining retractors were placed.   Decompressive laminectomy was then performed with Leksell rongeurs and  Kerrison punches.  There was a pars defect at 5 and all posterior elements  were loose with significant ligamentous hypertrophy underneath that.  A full  Gill decompressive laminectomy removing the pars and the facets at L5 was  done to get good decompression.  The L5 nerve roots bilaterally were  significantly compressed due to  spondylolisthesis and the spondylytic  changes.  Dissection went far lateral to uncover the 5 root as it went out  its foramen.  When I was finished, we had good decompression of all of the  central canal and all the nerve roots.  Attention was then taken to the disk  space and the 5-1 disk space was incised and diskectomy performed  bilaterally.  We used interspace spreaders to distract to 11 mm.  We used  various broaches to prepare the interspace for interbody cage fusion.  A  final broach was used to prepare the endplates, so we scraped the rest of  the endplates with curettes and removed disk with pituitary rongeurs.  All  the bone that was removed during the laminectomy was cleaned from its soft  tissue, chopped into small pieces and placed in the Symphony system  __________ .  This bone mixture was then packed into two 11 high x 9 wide  Brantigan interbody cages.  We  placed the same bone into the interbody space  and then while holding distraction on one side, tapped the cage into place,  removed the distraction, packed a little more bone into the interspace and  tapped the second cage into place.  We then checked all nerve roots and  central canal and everything was decompressed.   Attention was then taken to 4-5, diskectomy performed with pituitary  rongeurs and curettes and then on both sides, we distracted the interspace  up to 11 mm and prepared the interspace for interbody cage fusion.  At this  level on the left side, there was a very focal disk herniation through the  ligament right under where the fiber would come over and this was removed  during this diskectomy.  To prepare the interspace for fusion, we packed two  11 high x 9 wide Brantigan interbody cages with the autograft Symphony bone  mixture.  We placed this bone mixture into the interbody space, tapped one  cage in at one side, packed more bone on the other side and then tapped the  cage in after that.  Again, we explored nerve roots and central canal and we  had good decompression and cages were in good position.  It was irrigated  with antibiotic solution.  We then decorticated the transverse processes of  L4-5 and lateral sacrum to the lateral facets at all these levels  bilaterally.  We then, using fluoroscopy as a guide and our intraoperative  landmarks, found the appropriate point for L4, decorticated the area and  placed a pedicle probe down the pedicle by watching with fluoroscopy.  We  used a little ball probe to make sure we had bony edges all the way around,  tapped the pedicle, and placed a 50-mm Expedium pedicle screw that was 13  long by 6 wide.  All the screws were of a diameter of 6, we did this first  on the right side.  We then found the appropriate location for L5  and then S1, placed a pedicle probe down the pedicle, probed it with a small ball  probe, tapped it  and placed the screw, all under fluoroscopic imaging.  A 45-  mm screw was used at L5 and a 40-mm screw was used at S1.  We then repeated  this process on the left side, placing the 3 screws into position.  Rods  were placed into the screw heads and locking nuts placed and we tightened  the nuts.  At  L4, we then placed some compression over the 4-5 interspace  and tightened the nuts at L5, placed compression over the 5-1 space and  tightened the nuts at S1.   Final AP and lateral fluoroscopic images were obtained showing good position  of all screws, rods, interbody cages, good alignment of the spine and good  restoration of disk height.  Wound was again irrigated with antibiotic  solution and autograft Symphony bone mixture was mixed together with the  allograft Symphony bone mixture and this was packed in the posterolateral  gutters for a fusion from L4 to S1 bilaterally.  We then explored the dura  and nerve roots, they were all decompressed, placed some pieces of Gelfoam  over the gutters so that none of the bone fragments would compress the nerve  roots and the retractors were removed.  Paraspinous muscles were closed with  0 Vicryl interrupted sutures, deep fascia closed with 0 Vicryl interrupted  sutures, the subcutaneous tissue closed with 0, 2-0 and 3-0 Vicryl  interrupted suture and the skin closed with Benzoin and Steri-Strips.  A  dressing was placed.  The patient was placed back into a supine position,  awoke from anesthesia and transferred to the recovery room in stable  condition.    JRH/MEDQ  D:  10/06/2004  T:  10/07/2004  Job:  [id number]

## 2010-12-16 NOTE — Discharge Summary (Signed)
[name], [name]               ACCOUNT NO.:  [id number]   MEDICAL RECORD NO.:  [id number]          PATIENT TYPE:  INP   LOCATION:  3033                         FACILITY:  [facility]   PHYSICIAN:  Otilio Connors, M.D.  DATE OF BIRTH:  05-31-1941   DATE OF ADMISSION:  08/20/2006  DATE OF DISCHARGE:  08/25/2006                               DISCHARGE SUMMARY   ADMISSION DIAGNOSIS:  Lumbar stenosis, spondylosis, instability,  herniated nucleus pulposus, prior surgery and fusion.   DISCHARGE DIAGNOSIS:  Lumbar stenosis, spondylosis, instability,  herniated nucleus pulposus, prior surgery and fusion.   PROCEDURE:  T11-L4 posterior lumbar interbody fusion, 2-3 and 3-4  interbody cages at these levels with exploration of prior fusion from L4-  S1 with pedicle screw fixation with Expedium screws T10 through S1,  posterior lateral fusion T10 through S1 with autograft and allograft  infused BMP.   REASON FOR ADMISSION:  The patient is 70 year old gentleman who had 2  level fusion for stenosis and spondylolisthesis in the past has  developed further stenosis and instability.  The patient in for  decompression and fusion.   HOSPITAL COURSE:  The patient was admitted and underwent procedure of  the above without complications.  Postoperatively, she was transferred  to the recovery room and then to the ICU for observation.  The next day,  CBC and H&H labs were stable.  His legs felt much better.  He started  working with PT/OT, started increasing his activities.  He was doing  well with minimal incisional pain.  He was transferred to the step-down  unit on August 22, 2006.  He continued to do well.  He was transferred  to the floor on the 24th.  He had some slight drainage from the incision  at this point, he was getting up out of bed.  He felt a little weak in  the legs but was not having leg pain that he was having preoperatively  and was real happy with the progress that he was making.  He  continued  increasing his activity and strength, not taking pain medicine the last  couple of days of his hospitalization.  Drainage from the incision  lessened significantly and he continued doing well making great  progress.  He was discharged home in stable condition on August 25, 2006.   DISCHARGE MEDICATIONS:  Same as prehospitalization plus Percocet and  Flexeril p.r.n.  Follow up in one week in my office for staple removal.           ______________________________  Otilio Connors, M.D.     JRH/MEDQ  D:  09/27/2006  T:  09/27/2006  Job:  [id number]

## 2010-12-16 NOTE — Discharge Summary (Signed)
[name], [name]               ACCOUNT NO.:  [id number]   MEDICAL RECORD NO.:  [id number]          PATIENT TYPE:  INP   LOCATION:  3007                         FACILITY:  [facility]   PHYSICIAN:  Otilio Connors, M.D.  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   DISCHARGE DIAGNOSES:  Spondylosis and cervical spinal listhesis stenosis, L4-  5 and L5-S1.   PROCEDURE:  L5-S1 __________, left L4-5 and L5-S1 interbody running cages at  L4-5 and L5-S1, pedicle screw fixation L4-5 and L5-S1, posterior lateral  fusion L4 through S1 with autograft, allograft, Symphony.   REASON FOR ADMISSION:  The patient is a 70 year old gentleman who has had  back and leg pain and trouble walking and with neurogenic claudication.  __________stenosis at L4-5, and the patient __________ at these levels.   HOSPITAL COURSE:  The patient was admitted the day of surgery and underwent  procedure above without complications.  Postoperatively, the patient was  transferred to the recovery room and then to the floor where he started  getting up ambulating and noted a lot less leg tingling, and slowly  increased activity and PT and OT started working with the patient.  He had a  little dysuria, but that improved over time.  Pain was minimal, he was up  ambulating around.  Incision remained clean, dry, and intact.  He continued  making progress, and by October 10, 2004, he was discharged to home in stable  condition.  The patient will follow up in three to four weeks with [place].  No  strenuous activity, up with the brace.  Home medications plus Percocet and  Flexeril p.r.n.      JRH/MEDQ  D:  12/15/2004  T:  12/15/2004  Job:  [id number]

## 2011-02-16 ENCOUNTER — Encounter: Payer: Self-pay | Admitting: Internal Medicine

## 2011-02-16 DIAGNOSIS — I441 Atrioventricular block, second degree: Secondary | ICD-10-CM

## 2011-04-25 LAB — CBC
HCT: 44.1
MCHC: 33.8
MCV: 91.3
Platelets: 230
RBC: 3.88 — ABNORMAL LOW
RBC: 4.83
WBC: 11.8 — ABNORMAL HIGH
WBC: 7.8

## 2011-04-25 LAB — TYPE AND SCREEN: ABO/RH(D): O POS

## 2011-04-25 LAB — URINALYSIS, ROUTINE W REFLEX MICROSCOPIC
Glucose, UA: NEGATIVE
pH: 5

## 2011-04-25 LAB — BASIC METABOLIC PANEL
BUN: 22
CO2: 25
Calcium: 8.1 — ABNORMAL LOW
Chloride: 104
Creatinine, Ser: 1.18
GFR calc Af Amer: >60
GFR calc non Af Amer: >60
Potassium: 3.9

## 2011-05-12 ENCOUNTER — Encounter: Payer: Medicare Other | Admitting: *Deleted

## 2011-06-13 ENCOUNTER — Ambulatory Visit (INDEPENDENT_AMBULATORY_CARE_PROVIDER_SITE_OTHER): Payer: Medicare Other | Admitting: *Deleted

## 2011-06-13 DIAGNOSIS — I442 Atrioventricular block, complete: Secondary | ICD-10-CM

## 2011-06-13 DIAGNOSIS — I498 Other specified cardiac arrhythmias: Secondary | ICD-10-CM

## 2011-06-13 LAB — PACEMAKER DEVICE OBSERVATION
AL AMPLITUDE: 2.8 mv
ATRIAL PACING PM: 19
BAMS-0001: 150 {beats}/min
RV LEAD THRESHOLD: 0.5 V

## 2011-06-13 NOTE — Progress Notes (Signed)
Pacer check in clinic  

## 2011-07-19 ENCOUNTER — Encounter: Payer: Self-pay | Admitting: Internal Medicine

## 2011-09-15 ENCOUNTER — Encounter: Payer: Self-pay | Admitting: Internal Medicine

## 2011-09-15 DIAGNOSIS — I441 Atrioventricular block, second degree: Secondary | ICD-10-CM

## 2011-12-20 ENCOUNTER — Encounter: Payer: Self-pay | Admitting: Internal Medicine

## 2011-12-20 ENCOUNTER — Ambulatory Visit (INDEPENDENT_AMBULATORY_CARE_PROVIDER_SITE_OTHER): Payer: Medicare Other | Admitting: Internal Medicine

## 2011-12-20 VITALS — BP 126/77 | HR 59 | Resp 18 | Ht 68.0 in | Wt 252.0 lb

## 2011-12-20 DIAGNOSIS — R0602 Shortness of breath: Secondary | ICD-10-CM

## 2011-12-20 DIAGNOSIS — I10 Essential (primary) hypertension: Secondary | ICD-10-CM

## 2011-12-20 DIAGNOSIS — I2510 Atherosclerotic heart disease of native coronary artery without angina pectoris: Secondary | ICD-10-CM

## 2011-12-20 DIAGNOSIS — I498 Other specified cardiac arrhythmias: Secondary | ICD-10-CM

## 2011-12-20 LAB — PACEMAKER DEVICE OBSERVATION
AL AMPLITUDE: 2.8 mv
BAMS-0001: 150 {beats}/min
RV LEAD THRESHOLD: 1 V

## 2011-12-20 NOTE — Assessment & Plan Note (Signed)
Normal pacemaker function See Pace Art report No changes today  TTM in 3 months Folllow-up with Kristen in 6 months I will see in 12 months

## 2011-12-20 NOTE — Assessment & Plan Note (Signed)
Stable without CP Given dyspnea, I will obtain an echo.  If echo is normal no further testing is planned.  I suspect that his SOB is due primarily to deconditioning and obesity.  Regular exercise and weight loss are advised

## 2011-12-20 NOTE — Progress Notes (Signed)
PCP: Monico Blitz, MD, MD   The patient presents today for routine cardiology/ electrophysiology followup.  Since last being seen in our clinic, he reports doing very well.  He is primarily limited by arthritis with knee/ back pain.  He does finds that he is SOB with moderate activity.  Today, he denies symptoms of palpitations, chest pain, orthopnea, PND, lower extremity edema, dizziness, presyncope, syncope, or neurologic sequela.  The patient feels that he is tolerating medications without difficulties and is otherwise without complaint today.   Past Medical History  Diagnosis Date  . Coronary artery disease     Status post CABG  . Hypertension   . Diabetes mellitus     DM2  . Sleep apnea     USING CPAP  . Bradycardia     s/p PPM  . Spinal stenosis   . Osteoarthritis   . Obesity   . Dyslipidemia    Past Surgical History  Procedure Date  . Coronary artery bypass graft   . L5-s1 gill decompressive laminectomy   . Right total hip arthroplasty.   . Pacemaker insertion     Current Outpatient Prescriptions  Medication Sig Dispense Refill  . aspirin 81 MG tablet Take 81 mg by mouth daily.        [name] atenolol (TENORMIN) 50 MG tablet Take 50 mg by mouth daily.       . diclofenac (VOLTAREN) 75 MG EC tablet Take 75 mg by mouth 2 (two) times daily.        [name] glimepiride (AMARYL) 2 MG tablet Take 4 mg by mouth daily.       [name] losartan (COZAAR) 100 MG tablet Take 100 mg by mouth daily.      . Multiple Vitamin (MULTIVITAMIN) tablet Take 1 tablet by mouth daily.        . pioglitazone (ACTOS) 15 MG tablet Take 15 mg by mouth daily.        . simvastatin (ZOCOR) 20 MG tablet Take 40 mg by mouth at bedtime.         No Known Allergies  History   Social History  . Marital Status: Married    Spouse Name: N/A    Number of Children: N/A  . Years of Education: N/A   Occupational History  . 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 Smoking status: Former Smoker   Types: Cigarettes    Quit date: 10/30/1998  . Smokeless tobacco: Not on file  . Alcohol Use: No  . Drug Use: No  . Sexually Active: Not on file   Other Topics Concern  . Not on file   Social History Narrative  . No narrative on file   Physical Exam: Filed Vitals:   12/20/11 1129  BP: 126/77  Pulse: 59  Resp: 18  Height: 5\' 8"  (1.727 m)  Weight: 252 lb (114.306 kg)    GEN- The patient is well appearing, alert and oriented x 3 today.   Head- normocephalic, atraumatic Eyes-  Sclera clear, conjunctiva pink Ears- hearing intact Oropharynx- clear Neck- supple, no JVP Lymph- no cervical lymphadenopathy Lungs- Clear to ausculation bilaterally, normal work of breathing Chest- pacemaker pocket is well healed Heart- Regular rate and rhythm, no murmurs, rubs or gallops, PMI not laterally displaced GI- soft, NT, ND, + BS Extremities- no clubbing, cyanosis,  + chronic venous stasis changes with 1+ edema MS- walks slowly with a cane Skin- no rash or lesion Psych- euthymic mood,  full affect Neuro- strength and sensation are intact

## 2011-12-20 NOTE — Assessment & Plan Note (Signed)
Much improved No changes today  Weight loss encouraged

## 2011-12-20 NOTE — Patient Instructions (Signed)
   Echo If the results of your test are normal or stable, you will receive a letter.  If they are abnormal, the nurse will contact you by phone. Continue all current medications. Remote phone check in 3 months Device check in office in 6 months

## 2012-01-04 ENCOUNTER — Encounter: Payer: Self-pay | Admitting: Internal Medicine

## 2012-01-10 ENCOUNTER — Other Ambulatory Visit (INDEPENDENT_AMBULATORY_CARE_PROVIDER_SITE_OTHER): Payer: Medicare Other

## 2012-01-10 ENCOUNTER — Other Ambulatory Visit: Payer: Self-pay

## 2012-01-10 DIAGNOSIS — I2510 Atherosclerotic heart disease of native coronary artery without angina pectoris: Secondary | ICD-10-CM

## 2012-01-10 DIAGNOSIS — R0602 Shortness of breath: Secondary | ICD-10-CM

## 2012-01-12 ENCOUNTER — Telehealth: Payer: Self-pay | Admitting: *Deleted

## 2012-01-12 NOTE — Telephone Encounter (Signed)
Patient informed via voicemail.

## 2012-03-22 DIAGNOSIS — I441 Atrioventricular block, second degree: Secondary | ICD-10-CM

## 2012-06-21 DIAGNOSIS — I441 Atrioventricular block, second degree: Secondary | ICD-10-CM

## 2012-08-27 ENCOUNTER — Encounter: Payer: Self-pay | Admitting: *Deleted

## 2012-09-20 DIAGNOSIS — I441 Atrioventricular block, second degree: Secondary | ICD-10-CM

## 2012-12-19 ENCOUNTER — Encounter: Payer: Self-pay | Admitting: Internal Medicine

## 2012-12-19 ENCOUNTER — Ambulatory Visit (INDEPENDENT_AMBULATORY_CARE_PROVIDER_SITE_OTHER): Payer: Medicare Other | Admitting: Internal Medicine

## 2012-12-19 VITALS — BP 121/74 | HR 58 | Ht 68.0 in | Wt 244.4 lb

## 2012-12-19 DIAGNOSIS — I2510 Atherosclerotic heart disease of native coronary artery without angina pectoris: Secondary | ICD-10-CM

## 2012-12-19 DIAGNOSIS — I10 Essential (primary) hypertension: Secondary | ICD-10-CM

## 2012-12-19 DIAGNOSIS — I498 Other specified cardiac arrhythmias: Secondary | ICD-10-CM

## 2012-12-19 LAB — PACEMAKER DEVICE OBSERVATION
AL AMPLITUDE: 2.8 mv
AL THRESHOLD: 1 V
BAMS-0001: 150 {beats}/min
RV LEAD AMPLITUDE: 11.2 mv
RV LEAD THRESHOLD: 0.5 V

## 2012-12-19 MED ORDER — NITROGLYCERIN 0.4 MG SL SUBL: 0.4000 mg | SUBLINGUAL_TABLET | SUBLINGUAL | Status: DC | PRN

## 2012-12-19 NOTE — Patient Instructions (Addendum)
   Remote phone checks monthly Continue all current medications. Your physician wants you to follow up in:  1 year.  You will receive a reminder letter in the mail one-two months in advance.  If you don't receive a letter, please call our office to schedule the follow up appointment

## 2012-12-19 NOTE — Progress Notes (Signed)
PCP: Monico Blitz, MD   The patient presents today for routine cardiology/ electrophysiology followup.  Since last being seen in our clinic, he reports doing very well. 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 Today, he denies symptoms of palpitations, chest pain, orthopnea, PND, lower extremity edema, dizziness, presyncope, syncope, or neurologic sequela.  The patient feels that he is tolerating medications without difficulties and is otherwise without complaint today.   Past Medical History  Diagnosis Date  . Coronary artery disease     Status post CABG  . Hypertension   . Diabetes mellitus     DM2  . Sleep apnea     USING CPAP  . Bradycardia     s/p PPM  . Spinal stenosis   . Osteoarthritis   . Obesity   . Dyslipidemia    Past Surgical History  Procedure Laterality Date  . Coronary artery bypass graft    . L5-s1 gill decompressive laminectomy    . Right total hip arthroplasty.    . Pacemaker insertion      Current Outpatient Prescriptions  Medication Sig Dispense Refill  . aspirin 81 MG tablet Take 81 mg by mouth daily.        [name] atenolol (TENORMIN) 50 MG tablet Take 50 mg by mouth daily.       . diclofenac (VOLTAREN) 75 MG EC tablet Take 75 mg by mouth 2 (two) times daily.        [name] glimepiride (AMARYL) 4 MG tablet Take 4 mg by mouth daily. Takes 0.5-1 tablet daily      . hydrochlorothiazide (HYDRODIURIL) 25 MG tablet Take 12.5 mg by mouth daily.      [name] losartan (COZAAR) 100 MG tablet Take 100 mg by mouth daily.      . Multiple Vitamin (MULTIVITAMIN) tablet Take 1 tablet by mouth daily.        . pioglitazone (ACTOS) 15 MG tablet Take 15 mg by mouth daily.        . simvastatin (ZOCOR) 40 MG tablet Take 40 mg by mouth daily.       No current facility-administered medications for this visit.    No Known Allergies  History   Social History  . Marital Status: Married    Spouse Name: N/A    Number of Children: N/A  . Years of Education: N/A   Occupational History   . 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 Smoking status: Former Smoker    Types: Cigarettes    Quit date: 10/30/1998  . Smokeless tobacco: Not on file  . Alcohol Use: No  . Drug Use: No  . Sexually Active: Not on file   Other Topics Concern  . Not on file   Social History Narrative  . No narrative on file   Physical Exam: Filed Vitals:   12/19/12 1322  BP: 121/74  Pulse: 58  Height: 5\' 8"  (1.727 m)  Weight: 244 lb 6.4 oz (110.859 kg)    GEN- The patient is well appearing, alert and oriented x 3 today.   Head- normocephalic, atraumatic Eyes-  Sclera clear, conjunctiva pink Ears- hearing intact Oropharynx- clear Neck- supple, no JVP Lymph- no cervical lymphadenopathy Lungs- Clear to ausculation bilaterally, normal work of breathing Chest- pacemaker pocket is well healed Heart- Regular rate and rhythm, no murmurs, rubs or gallops, PMI not laterally displaced GI- soft, NT, ND, + BS Extremities- no clubbing, cyanosis,  + chronic venous stasis changes with 1+  edema  A/P 1. Symptomatic bradycardia Normal pacemaker function See Pace Art report No changes today He is approaching ERI (10 months) Monthly ttms  2. CAD No ischemic symptoms  3. HTN Stable No change required today  4. Obesity Weight loss advised  Return in 1 year

## 2013-01-20 ENCOUNTER — Encounter: Payer: Self-pay | Admitting: Internal Medicine

## 2013-01-20 DIAGNOSIS — I441 Atrioventricular block, second degree: Secondary | ICD-10-CM

## 2013-03-03 DIAGNOSIS — I441 Atrioventricular block, second degree: Secondary | ICD-10-CM

## 2013-04-21 ENCOUNTER — Encounter: Payer: Self-pay | Admitting: Internal Medicine

## 2013-04-21 DIAGNOSIS — I441 Atrioventricular block, second degree: Secondary | ICD-10-CM

## 2013-05-26 DIAGNOSIS — I441 Atrioventricular block, second degree: Secondary | ICD-10-CM

## 2013-06-13 ENCOUNTER — Encounter: Payer: Self-pay | Admitting: Internal Medicine

## 2013-06-23 ENCOUNTER — Encounter: Payer: Self-pay | Admitting: Internal Medicine

## 2013-06-23 DIAGNOSIS — I441 Atrioventricular block, second degree: Secondary | ICD-10-CM

## 2013-07-21 ENCOUNTER — Encounter: Payer: Self-pay | Admitting: Internal Medicine

## 2013-07-21 DIAGNOSIS — I441 Atrioventricular block, second degree: Secondary | ICD-10-CM

## 2013-08-25 ENCOUNTER — Encounter: Payer: Self-pay | Admitting: Internal Medicine

## 2013-08-25 DIAGNOSIS — I441 Atrioventricular block, second degree: Secondary | ICD-10-CM

## 2013-09-22 ENCOUNTER — Encounter: Payer: Self-pay | Admitting: Internal Medicine

## 2013-09-22 DIAGNOSIS — I441 Atrioventricular block, second degree: Secondary | ICD-10-CM

## 2013-10-20 ENCOUNTER — Encounter: Payer: Self-pay | Admitting: Internal Medicine

## 2013-11-24 DIAGNOSIS — I441 Atrioventricular block, second degree: Secondary | ICD-10-CM

## 2013-12-18 ENCOUNTER — Encounter: Payer: Self-pay | Admitting: Internal Medicine

## 2013-12-26 ENCOUNTER — Encounter: Payer: Self-pay | Admitting: Internal Medicine

## 2013-12-26 ENCOUNTER — Ambulatory Visit (INDEPENDENT_AMBULATORY_CARE_PROVIDER_SITE_OTHER): Payer: Medicare Other | Admitting: Internal Medicine

## 2013-12-26 VITALS — BP 155/82 | HR 58 | Ht 68.0 in | Wt 236.0 lb

## 2013-12-26 DIAGNOSIS — I2510 Atherosclerotic heart disease of native coronary artery without angina pectoris: Secondary | ICD-10-CM

## 2013-12-26 DIAGNOSIS — I442 Atrioventricular block, complete: Secondary | ICD-10-CM

## 2013-12-26 DIAGNOSIS — I498 Other specified cardiac arrhythmias: Secondary | ICD-10-CM

## 2013-12-26 DIAGNOSIS — I10 Essential (primary) hypertension: Secondary | ICD-10-CM

## 2013-12-26 LAB — MDC_IDC_ENUM_SESS_TYPE_INCLINIC
Battery Impedance: 7482 Ohm
Battery Voltage: 2.64 V
Brady Statistic AP VP Percent: 13 %
Brady Statistic AP VS Percent: 12.1 %
Brady Statistic AS VP Percent: 0.1 %
Brady Statistic AS VS Percent: 74.9 %
Date Time Interrogation Session: 20150529121459
Lead Channel Impedance Value: 371 Ohm
Lead Channel Impedance Value: 442 Ohm
Lead Channel Pacing Threshold Amplitude: 0.5 V
Lead Channel Pacing Threshold Pulse Width: 0.4 ms
Lead Channel Pacing Threshold Pulse Width: 0.4 ms
Lead Channel Sensing Intrinsic Amplitude: 2.8 mV
Lead Channel Setting Pacing Amplitude: 2.5 V
Lead Channel Setting Sensing Sensitivity: 2.8 mV
MDC IDC MSMT BATTERY REMAINING LONGEVITY: 0 mo
MDC IDC MSMT LEADCHNL RA PACING THRESHOLD AMPLITUDE: 1 V
MDC IDC MSMT LEADCHNL RV SENSING INTR AMPL: 8 mV
MDC IDC SET LEADCHNL RA PACING AMPLITUDE: 2 V
MDC IDC SET LEADCHNL RV PACING PULSEWIDTH: 0.4 ms

## 2013-12-26 NOTE — Patient Instructions (Signed)
   Please follow up with Kristen in the device clinic in 1 month. Your physician recommends that you continue on your current medications as directed. Please refer to the Current Medication list given to you today.

## 2013-12-26 NOTE — Progress Notes (Signed)
PCP: Monico Blitz, MD  The patient presents today for routine cardiology/ electrophysiology followup.  Since last being seen in our clinic, he reports doing very well. 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  He is excited about a recent house purchase which he is about to rehab. Today, he denies symptoms of palpitations, chest pain, orthopnea, PND, lower extremity edema, dizziness, presyncope, syncope, or neurologic sequela.  The patient feels that he is tolerating medications without difficulties and is otherwise without complaint today.   Past Medical History  Diagnosis Date  . Coronary artery disease     Status post CABG  . Hypertension   . Diabetes mellitus     DM2  . Sleep apnea     USING CPAP  . Bradycardia     s/p PPM  . Spinal stenosis   . Osteoarthritis   . Obesity   . Dyslipidemia    Past Surgical History  Procedure Laterality Date  . Coronary artery bypass graft    . L5-s1 gill decompressive laminectomy    . Right total hip arthroplasty.    . Pacemaker insertion      Current Outpatient Prescriptions  Medication Sig Dispense Refill  . aspirin 81 MG tablet Take 81 mg by mouth daily.        [name] atenolol (TENORMIN) 50 MG tablet Take 50 mg by mouth daily.       . diclofenac (VOLTAREN) 75 MG EC tablet Take 75 mg by mouth 2 (two) times daily.        [name] glimepiride (AMARYL) 4 MG tablet Take 4 mg by mouth daily. Takes 0.5-1 tablet daily      . hydrochlorothiazide (HYDRODIURIL) 25 MG tablet Take 12.5 mg by mouth daily.      [name] losartan (COZAAR) 100 MG tablet Take 100 mg by mouth daily.      . Multiple Vitamin (MULTIVITAMIN) tablet Take 1 tablet by mouth daily.        . nitroGLYCERIN (NITROSTAT) 0.4 MG SL tablet Place 1 tablet (0.4 mg total) under the tongue every 5 (five) minutes as needed for chest pain.  25 tablet  3  . pioglitazone (ACTOS) 15 MG tablet Take 15 mg by mouth daily.        . simvastatin (ZOCOR) 40 MG tablet Take 40 mg by mouth daily.       No current  facility-administered medications for this visit.    No Known Allergies  History   Social History  . Marital Status: Married    Spouse Name: N/A    Number of Children: N/A  . Years of Education: N/A   Occupational History  . 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 Smoking status: Former Smoker    Types: Cigarettes    Quit date: 10/30/1998  . Smokeless tobacco: Never Used  . Alcohol Use: No  . Drug Use: No  . Sexual Activity: Not on file   Other Topics Concern  . Not on file   Social History Narrative  . No narrative on file   Physical Exam: Filed Vitals:   12/26/13 1126  BP: 155/82  Pulse: 58  Height: 5\' 8"  (1.727 m)  Weight: 236 lb (107.049 kg)    GEN- The patient is well appearing, alert and oriented x 3 today.   Head- normocephalic, atraumatic Eyes-  Sclera clear, conjunctiva pink Ears- hearing intact Oropharynx- clear Neck- supple,  Lungs- Clear to ausculation bilaterally, normal work of breathing Chest-  pacemaker pocket is well healed Heart- Regular rate and rhythm, no murmurs, rubs or gallops, PMI not laterally displaced GI- soft, NT, ND, + BS Extremities- no clubbing, cyanosis,  + chronic venous stasis changes with 1+ edema  A/P 1. Symptomatic bradycardia Normal pacemaker function See Pace Art report No changes today He is approaching ERI (<64months) Monthly follow-up with Erasmo Downer Today we discussed risks, benefits, and alternatives to PPM generator change. Once he reaches ERI, he can be scheduled for generator change without having to see me in the office first.  2. CAD No ischemic symptoms  3. HTN Above goal today I have encouraged lifestyle modification and avoidance of NSAIDs He says bp is typically better controlled at home  4. Obesity Weight loss advised  Return in 1 month to see Erasmo Downer I will perform generator change once ERI

## 2014-01-23 ENCOUNTER — Ambulatory Visit (INDEPENDENT_AMBULATORY_CARE_PROVIDER_SITE_OTHER): Payer: Medicare Other | Admitting: *Deleted

## 2014-01-23 DIAGNOSIS — I495 Sick sinus syndrome: Secondary | ICD-10-CM

## 2014-01-23 LAB — MDC_IDC_ENUM_SESS_TYPE_INCLINIC
Lead Channel Setting Pacing Amplitude: 2 V
Lead Channel Setting Pacing Pulse Width: 0.4 ms
MDC IDC SET LEADCHNL RV PACING AMPLITUDE: 2.5 V
MDC IDC SET LEADCHNL RV SENSING SENSITIVITY: 2.8 mV

## 2014-01-23 NOTE — Progress Notes (Signed)
Pacemaker check in clinic. Battery longevity < 1 month with range < 1 -4 months. ROV 02-27-14 @ 1130 with device clinic.

## 2014-02-04 ENCOUNTER — Encounter: Payer: Self-pay | Admitting: Internal Medicine

## 2014-02-27 ENCOUNTER — Ambulatory Visit (INDEPENDENT_AMBULATORY_CARE_PROVIDER_SITE_OTHER): Payer: Medicare Other | Admitting: *Deleted

## 2014-02-27 DIAGNOSIS — I495 Sick sinus syndrome: Secondary | ICD-10-CM

## 2014-02-27 LAB — MDC_IDC_ENUM_SESS_TYPE_INCLINIC
Lead Channel Setting Pacing Amplitude: 2 V
Lead Channel Setting Pacing Amplitude: 2.5 V
Lead Channel Setting Pacing Pulse Width: 0.4 ms
Lead Channel Setting Sensing Sensitivity: 2.8 mV

## 2014-02-27 NOTE — Progress Notes (Signed)
Pacemaker check in clinic for battery check. Battery longevity < 1 mth. ROV in 1 mth for device check.

## 2014-03-16 ENCOUNTER — Encounter: Payer: Self-pay | Admitting: Internal Medicine

## 2014-03-27 ENCOUNTER — Encounter: Payer: Self-pay | Admitting: Internal Medicine

## 2014-03-27 ENCOUNTER — Telehealth: Payer: Self-pay | Admitting: *Deleted

## 2014-03-27 ENCOUNTER — Encounter: Payer: Self-pay | Admitting: *Deleted

## 2014-03-27 ENCOUNTER — Ambulatory Visit (INDEPENDENT_AMBULATORY_CARE_PROVIDER_SITE_OTHER): Payer: Medicare Other | Admitting: Internal Medicine

## 2014-03-27 VITALS — BP 120/78 | HR 65 | Ht 68.0 in | Wt 224.0 lb

## 2014-03-27 DIAGNOSIS — I10 Essential (primary) hypertension: Secondary | ICD-10-CM

## 2014-03-27 DIAGNOSIS — I2510 Atherosclerotic heart disease of native coronary artery without angina pectoris: Secondary | ICD-10-CM

## 2014-03-27 DIAGNOSIS — I498 Other specified cardiac arrhythmias: Secondary | ICD-10-CM

## 2014-03-27 LAB — MDC_IDC_ENUM_SESS_TYPE_INCLINIC
Battery Remaining Longevity: -3 mo
Brady Statistic RV Percent Paced: 21.5 %
Lead Channel Impedance Value: 401 Ohm
Lead Channel Pacing Threshold Amplitude: 0.5 V
Lead Channel Sensing Intrinsic Amplitude: 8 mV
Lead Channel Setting Pacing Pulse Width: 0.4 ms
Lead Channel Setting Sensing Sensitivity: 2.8 mV
MDC IDC MSMT BATTERY IMPEDANCE: 10353 Ohm
MDC IDC MSMT BATTERY VOLTAGE: 2.57 V
MDC IDC MSMT LEADCHNL RA IMPEDANCE VALUE: 0 Ohm
MDC IDC MSMT LEADCHNL RV PACING THRESHOLD PULSEWIDTH: 0.4 ms
MDC IDC SESS DTM: 20150828133303
MDC IDC SET LEADCHNL RV PACING AMPLITUDE: 2.5 V

## 2014-03-27 NOTE — Patient Instructions (Signed)
   You are scheduled for a generator change out this coming Monday, March 30, 2014 at [facility] with Dr. Rayann Heman.  See sheet for instructions.

## 2014-03-27 NOTE — Progress Notes (Signed)
PCP: Monico Blitz, MD  The patient presents today for cardiology/ electrophysiology followup.  Since last being seen in our clinic, he reports doing very well. 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  He has recently reached ERI battery status.  He now has significant fatigue.  Today, he denies symptoms of palpitations, chest pain, orthopnea, PND, lower extremity edema, dizziness, presyncope, syncope, or neurologic sequela.  The patient feels that he is tolerating medications without difficulties and is otherwise without complaint today.   Past Medical History  Diagnosis Date  . Coronary artery disease     Status post CABG  . Hypertension   . Diabetes mellitus     DM2  . Sleep apnea     USING CPAP  . Bradycardia     s/p PPM  . Spinal stenosis   . Osteoarthritis   . Obesity   . Dyslipidemia    Past Surgical History  Procedure Laterality Date  . Coronary artery bypass graft    . L5-s1 gill decompressive laminectomy    . Right total hip arthroplasty.    . Pacemaker insertion      Current Outpatient Prescriptions  Medication Sig Dispense Refill  . aspirin 81 MG tablet Take 81 mg by mouth daily.        [name] atenolol (TENORMIN) 50 MG tablet Take 50 mg by mouth daily.       . diclofenac (VOLTAREN) 75 MG EC tablet Take 75 mg by mouth 2 (two) times daily.        [name] glimepiride (AMARYL) 4 MG tablet Take 4 mg by mouth daily. Takes 0.5-1 tablet daily      . hydrochlorothiazide (HYDRODIURIL) 25 MG tablet Take 12.5 mg by mouth daily.      [name] losartan (COZAAR) 100 MG tablet Take 100 mg by mouth daily.      . Multiple Vitamin (MULTIVITAMIN) tablet Take 1 tablet by mouth daily.        . nitroGLYCERIN (NITROSTAT) 0.4 MG SL tablet Place 1 tablet (0.4 mg total) under the tongue every 5 (five) minutes as needed for chest pain.  25 tablet  3  . pioglitazone (ACTOS) 15 MG tablet Take 15 mg by mouth daily.        . simvastatin (ZOCOR) 40 MG tablet Take 40 mg by mouth daily.       No current  facility-administered medications for this visit.    No Known Allergies  History   Social History  . Marital Status: Married    Spouse Name: N/A    Number of Children: N/A  . Years of Education: N/A   Occupational History  . 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 Smoking status: Former Smoker -- 1.00 packs/day for 42 years    Types: Cigarettes    Start date: 01/28/1961    Quit date: 10/30/1998  . Smokeless tobacco: Never Used  . Alcohol Use: No  . Drug Use: No  . Sexual Activity: Not on file   Other Topics Concern  . Not on file   Social History Narrative  . No narrative on file   Physical Exam: Filed Vitals:   03/27/14 1324  BP: 120/78  Pulse: 65  Height: 5\' 8"  (1.727 m)  Weight: 224 lb (101.606 kg)  SpO2: 95%    GEN- The patient is well appearing, alert and oriented x 3 today.   Head- normocephalic, atraumatic Eyes-  Sclera clear, conjunctiva pink Ears- hearing intact Oropharynx-  clear Neck- supple,  Lungs- Clear to ausculation bilaterally, normal work of breathing Chest- pacemaker pocket is well healed Heart- Regular rate and rhythm, no murmurs, rubs or gallops, PMI not laterally displaced GI- soft, NT, ND, + BS Extremities- no clubbing, cyanosis,  + chronic venous stasis changes with 1+ edema  A/P 1. Symptomatic bradycardia Pacemaker has reached ERI battery status See Claudia Desanctis Art report No changes today Today we discussed risks, benefits, and alternatives to PPM generator change.  He wishes to proceed at the next available time.  2. CAD No ischemic symptoms  3. HTN Stable No change required today  4. Obesity Weight loss advised

## 2014-03-27 NOTE — Telephone Encounter (Signed)
Patient's wife informed that when he comes to visit today, it can be determined at that time if he needed to see a doctor about his sob.

## 2014-03-29 DIAGNOSIS — Z79899 Other long term (current) drug therapy: Secondary | ICD-10-CM | POA: Diagnosis not present

## 2014-03-29 DIAGNOSIS — Z791 Long term (current) use of non-steroidal anti-inflammatories (NSAID): Secondary | ICD-10-CM | POA: Diagnosis not present

## 2014-03-29 DIAGNOSIS — Z7982 Long term (current) use of aspirin: Secondary | ICD-10-CM | POA: Diagnosis not present

## 2014-03-29 DIAGNOSIS — Z6834 Body mass index (BMI) 34.0-34.9, adult: Secondary | ICD-10-CM | POA: Diagnosis not present

## 2014-03-29 DIAGNOSIS — I495 Sick sinus syndrome: Secondary | ICD-10-CM | POA: Diagnosis not present

## 2014-03-29 DIAGNOSIS — I2510 Atherosclerotic heart disease of native coronary artery without angina pectoris: Secondary | ICD-10-CM | POA: Diagnosis not present

## 2014-03-29 DIAGNOSIS — Z951 Presence of aortocoronary bypass graft: Secondary | ICD-10-CM | POA: Diagnosis not present

## 2014-03-29 DIAGNOSIS — Z9989 Dependence on other enabling machines and devices: Secondary | ICD-10-CM | POA: Diagnosis not present

## 2014-03-29 DIAGNOSIS — E119 Type 2 diabetes mellitus without complications: Secondary | ICD-10-CM | POA: Diagnosis not present

## 2014-03-29 DIAGNOSIS — Z45018 Encounter for adjustment and management of other part of cardiac pacemaker: Secondary | ICD-10-CM | POA: Diagnosis not present

## 2014-03-29 DIAGNOSIS — I10 Essential (primary) hypertension: Secondary | ICD-10-CM | POA: Diagnosis not present

## 2014-03-29 DIAGNOSIS — Z87891 Personal history of nicotine dependence: Secondary | ICD-10-CM | POA: Diagnosis not present

## 2014-03-29 DIAGNOSIS — E669 Obesity, unspecified: Secondary | ICD-10-CM | POA: Diagnosis not present

## 2014-03-29 DIAGNOSIS — G4730 Sleep apnea, unspecified: Secondary | ICD-10-CM | POA: Diagnosis not present

## 2014-03-29 MED ORDER — SODIUM CHLORIDE 0.9 % IR SOLN
80.0000 mg | Status: DC
  Filled 2014-03-29: qty 2

## 2014-03-29 MED ORDER — CEFAZOLIN SODIUM-DEXTROSE 2-3 GM-% IV SOLR: 2.0000 g | INTRAVENOUS | Status: DC

## 2014-03-30 ENCOUNTER — Ambulatory Visit: Payer: Medicare Other | Admitting: Cardiology

## 2014-03-30 ENCOUNTER — Encounter (HOSPITAL_COMMUNITY)
Admission: RE | Disposition: A | Discharge status: Home / Self Care | Payer: Self-pay | Source: Ambulatory Visit | Attending: Internal Medicine

## 2014-03-30 ENCOUNTER — Ambulatory Visit (HOSPITAL_COMMUNITY)
Admission: RE | Admit: 2014-03-30 | Discharge: 2014-03-30 | Disposition: A | Discharge status: Home / Self Care | Payer: Medicare Other | Source: Ambulatory Visit | Attending: Internal Medicine | Admitting: Internal Medicine

## 2014-03-30 DIAGNOSIS — Z87891 Personal history of nicotine dependence: Secondary | ICD-10-CM | POA: Insufficient documentation

## 2014-03-30 DIAGNOSIS — Z45018 Encounter for adjustment and management of other part of cardiac pacemaker: Secondary | ICD-10-CM | POA: Insufficient documentation

## 2014-03-30 DIAGNOSIS — E119 Type 2 diabetes mellitus without complications: Secondary | ICD-10-CM | POA: Insufficient documentation

## 2014-03-30 DIAGNOSIS — Z79899 Other long term (current) drug therapy: Secondary | ICD-10-CM | POA: Insufficient documentation

## 2014-03-30 DIAGNOSIS — I495 Sick sinus syndrome: Secondary | ICD-10-CM | POA: Diagnosis present

## 2014-03-30 DIAGNOSIS — I10 Essential (primary) hypertension: Secondary | ICD-10-CM | POA: Insufficient documentation

## 2014-03-30 DIAGNOSIS — I2510 Atherosclerotic heart disease of native coronary artery without angina pectoris: Secondary | ICD-10-CM | POA: Insufficient documentation

## 2014-03-30 DIAGNOSIS — Z6834 Body mass index (BMI) 34.0-34.9, adult: Secondary | ICD-10-CM | POA: Insufficient documentation

## 2014-03-30 DIAGNOSIS — Z791 Long term (current) use of non-steroidal anti-inflammatories (NSAID): Secondary | ICD-10-CM | POA: Insufficient documentation

## 2014-03-30 DIAGNOSIS — Z951 Presence of aortocoronary bypass graft: Secondary | ICD-10-CM | POA: Insufficient documentation

## 2014-03-30 DIAGNOSIS — Z9989 Dependence on other enabling machines and devices: Secondary | ICD-10-CM | POA: Insufficient documentation

## 2014-03-30 DIAGNOSIS — E669 Obesity, unspecified: Secondary | ICD-10-CM | POA: Insufficient documentation

## 2014-03-30 DIAGNOSIS — G4730 Sleep apnea, unspecified: Secondary | ICD-10-CM | POA: Insufficient documentation

## 2014-03-30 DIAGNOSIS — Z7982 Long term (current) use of aspirin: Secondary | ICD-10-CM | POA: Insufficient documentation

## 2014-03-30 HISTORY — PX: PERMANENT PACEMAKER GENERATOR CHANGE: SHX6022

## 2014-03-30 LAB — CBC
HCT: 39.7 % (ref 39.0–52.0)
HEMOGLOBIN: 13.5 g/dL (ref 13.0–17.0)
MCH: 31.8 pg (ref 26.0–34.0)
MCHC: 34 g/dL (ref 30.0–36.0)
MCV: 93.4 fL (ref 78.0–100.0)
PLATELETS: 141 10*3/uL — AB (ref 150–400)
RBC: 4.25 MIL/uL (ref 4.22–5.81)
RDW: 14.4 % (ref 11.5–15.5)
WBC: 5.9 10*3/uL (ref 4.0–10.5)

## 2014-03-30 LAB — BASIC METABOLIC PANEL
Anion gap: 13 (ref 5–15)
BUN: 58 mg/dL — ABNORMAL HIGH (ref 6–23)
CHLORIDE: 106 meq/L (ref 96–112)
CO2: 20 meq/L (ref 19–32)
Calcium: 9 mg/dL (ref 8.4–10.5)
Creatinine, Ser: 2.15 mg/dL — ABNORMAL HIGH (ref 0.50–1.35)
GFR calc Af Amer: 33 mL/min — ABNORMAL LOW (ref >90)
GFR calc non Af Amer: 29 mL/min — ABNORMAL LOW (ref >90)
GLUCOSE: 136 mg/dL — AB (ref 70–99)
Potassium: 4.9 mEq/L (ref 3.7–5.3)
Sodium: 139 mEq/L (ref 137–147)

## 2014-03-30 LAB — GLUCOSE, CAPILLARY
GLUCOSE-CAPILLARY: 134 mg/dL — AB (ref 70–99)
Glucose-Capillary: 108 mg/dL — ABNORMAL HIGH (ref 70–99)

## 2014-03-30 LAB — SURGICAL PCR SCREEN
MRSA, PCR: NEGATIVE
STAPHYLOCOCCUS AUREUS: NEGATIVE

## 2014-03-30 SURGERY — PERMANENT PACEMAKER GENERATOR CHANGE
Anesthesia: LOCAL

## 2014-03-30 MED ORDER — MIDAZOLAM HCL 5 MG/5ML IJ SOLN
INTRAMUSCULAR | Status: AC
  Filled 2014-03-30: qty 5

## 2014-03-30 MED ORDER — FENTANYL CITRATE 0.05 MG/ML IJ SOLN
INTRAMUSCULAR | Status: AC
  Filled 2014-03-30: qty 2

## 2014-03-30 MED ORDER — SODIUM CHLORIDE 0.9 % IJ SOLN
3.0000 mL | Freq: Two times a day (BID) | INTRAMUSCULAR | Status: DC
Start: 2014-03-30 — End: 2014-03-30

## 2014-03-30 MED ORDER — SODIUM CHLORIDE 0.9 % IV SOLN
250.0000 mL | INTRAVENOUS | Status: DC | PRN
Start: 2014-03-30 — End: 2014-03-30

## 2014-03-30 MED ORDER — MUPIROCIN 2 % EX OINT
TOPICAL_OINTMENT | CUTANEOUS | Status: AC
  Administered 2014-03-30: 1
  Filled 2014-03-30: qty 22

## 2014-03-30 MED ORDER — CHLORHEXIDINE GLUCONATE 4 % EX LIQD
60.0000 mL | Freq: Once | CUTANEOUS | Status: DC
  Filled 2014-03-30: qty 60

## 2014-03-30 MED ORDER — SODIUM CHLORIDE 0.9 % IV SOLN
INTRAVENOUS | Status: DC
  Administered 2014-03-30: 06:00:00 via INTRAVENOUS

## 2014-03-30 MED ORDER — ACETAMINOPHEN 325 MG PO TABS
325.0000 mg | ORAL_TABLET | ORAL | Status: DC | PRN
  Filled 2014-03-30: qty 2

## 2014-03-30 MED ORDER — HYDROCODONE-ACETAMINOPHEN 5-325 MG PO TABS: 1.0000 | ORAL_TABLET | ORAL | Status: DC | PRN

## 2014-03-30 MED ORDER — ONDANSETRON HCL 4 MG/2ML IJ SOLN: 4.0000 mg | Freq: Four times a day (QID) | INTRAMUSCULAR | Status: DC | PRN

## 2014-03-30 MED ORDER — SODIUM CHLORIDE 0.9 % IJ SOLN: 3.0000 mL | INTRAMUSCULAR | Status: DC | PRN

## 2014-03-30 MED ORDER — HEPARIN (PORCINE) IN NACL 2-0.9 UNIT/ML-% IJ SOLN
INTRAMUSCULAR | Status: AC
  Filled 2014-03-30: qty 500

## 2014-03-30 NOTE — Progress Notes (Signed)
Notified Dr Rayann Heman that Cr 2.1 from this am blood work.  No new orders

## 2014-03-30 NOTE — Op Note (Signed)
SURGEON:  Thompson Grayer, MD     PREPROCEDURE DIAGNOSES:   1. Sick sinus syndrome.     POSTPROCEDURE DIAGNOSES:   1. Sick sinus syndrome.   2. Elective Replacement Indicator Status    PROCEDURES:   1. Pacemaker pulse generator replacement.   2. Skin pocket revision.      INTRODUCTION:  [name] is a 73 y.o. male with a history of sick sinus syndrome. The patient has done well since his pacemaker was implanted.  The patient  has recently reached ERI battery status.  The patient presents today for pacemaker pulse generator replacement.       DESCRIPTION OF THE PROCEDURE:  Informed written consent was obtained, and the patient was brought to the electrophysiology lab in the fasting state.  The patient's pacemaker was interrogated today and found to be at elective replacement indicator battery status.  The patient required no sedation for the procedure today.  The patient's left chest was prepped and draped in the usual sterile fashion by the EP lab staff.  The skin overlying the existing pacemaker was infiltrated with lidocaine for local analgesia.  A 4-cm incision was made over the pacemaker pocket.  Using a combination of sharp and blunt dissection, the pacemaker was exposed and removed from the body.  The device was disconnected from the leads.  There was no foreign matter or debris within the pocket.  The atrial lead was confirmed to be a Lynn [phone] (serial number [id number]) lead implanted on 11/17/1998.  The right ventricular lead was confirmed to be a Medtronic model 4023 (serial number [id number]) lead implanted on the same date as the atrial lead (above).  Both leads were examined and their integrity was confirmed to be intact.  Atrial lead P-waves measured 2.9 mV with impedance of 336 ohms and a threshold of 0.2 V at 0.5 msec.  Right ventricular lead R-waves measured 7.8 mV with impedance of 477 ohms and a threshold of 0.6 V at 0.5 msec.  The RV lead is a unipolar lead Both  leads were connected to a Medtronic adapt L model ADDDR 1 (serial number [id number]) pacemaker.  The pocket was revised to accommodate this new device.  Electrocautery was required to assure hemostasis.  The pocket was irrigated with copious gentamicin solution. The pacemaker was then placed into the pocket.  The pocket was then closed in 2 layers with 2-0 Vicryl suture over the subcutaneous and subcuticular layers.  Steri-Strips and a sterile dressing were then applied.EBL<17ml. There were no early apparent complications.     CONCLUSIONS:   1. Successful pacemaker pulse generator replacement for elective replacement indicator battery status   2. No early apparent complications.     Thompson Grayer, MD 03/30/2014 8:28 AM

## 2014-03-30 NOTE — Interval H&P Note (Signed)
History and Physical Interval Note:  03/30/2014 7:22 AM  [name]  has presented today for surgery, with the diagnosis of bradicardia  The various methods of treatment have been discussed with the patient and family. After consideration of risks, benefits and other options for treatment, the patient has consented to  Procedure(s): PERMANENT PACEMAKER GENERATOR CHANGE (N/A) as a surgical intervention .  The patient's history has been reviewed, patient examined, no change in status, stable for surgery.  I have reviewed the patient's chart and labs.  Questions were answered to the patient's satisfaction.     Thompson Grayer

## 2014-03-30 NOTE — H&P (View-Only) (Signed)
PCP: Monico Blitz, MD  The patient presents today for cardiology/ electrophysiology followup.  Since last being seen in our clinic, he reports doing very well. 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  He has recently reached ERI battery status.  He now has significant fatigue.  Today, he denies symptoms of palpitations, chest pain, orthopnea, PND, lower extremity edema, dizziness, presyncope, syncope, or neurologic sequela.  The patient feels that he is tolerating medications without difficulties and is otherwise without complaint today.   Past Medical History  Diagnosis Date  . Coronary artery disease     Status post CABG  . Hypertension   . Diabetes mellitus     DM2  . Sleep apnea     USING CPAP  . Bradycardia     s/p PPM  . Spinal stenosis   . Osteoarthritis   . Obesity   . Dyslipidemia    Past Surgical History  Procedure Laterality Date  . Coronary artery bypass graft    . L5-s1 gill decompressive laminectomy    . Right total hip arthroplasty.    . Pacemaker insertion      Current Outpatient Prescriptions  Medication Sig Dispense Refill  . aspirin 81 MG tablet Take 81 mg by mouth daily.        [name] atenolol (TENORMIN) 50 MG tablet Take 50 mg by mouth daily.       . diclofenac (VOLTAREN) 75 MG EC tablet Take 75 mg by mouth 2 (two) times daily.        [name] glimepiride (AMARYL) 4 MG tablet Take 4 mg by mouth daily. Takes 0.5-1 tablet daily      . hydrochlorothiazide (HYDRODIURIL) 25 MG tablet Take 12.5 mg by mouth daily.      [name] losartan (COZAAR) 100 MG tablet Take 100 mg by mouth daily.      . Multiple Vitamin (MULTIVITAMIN) tablet Take 1 tablet by mouth daily.        . nitroGLYCERIN (NITROSTAT) 0.4 MG SL tablet Place 1 tablet (0.4 mg total) under the tongue every 5 (five) minutes as needed for chest pain.  25 tablet  3  . pioglitazone (ACTOS) 15 MG tablet Take 15 mg by mouth daily.        . simvastatin (ZOCOR) 40 MG tablet Take 40 mg by mouth daily.       No current  facility-administered medications for this visit.    No Known Allergies  History   Social History  . Marital Status: Married    Spouse Name: N/A    Number of Children: N/A  . Years of Education: N/A   Occupational History  . 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 Smoking status: Former Smoker -- 1.00 packs/day for 42 years    Types: Cigarettes    Start date: 01/28/1961    Quit date: 10/30/1998  . Smokeless tobacco: Never Used  . Alcohol Use: No  . Drug Use: No  . Sexual Activity: Not on file   Other Topics Concern  . Not on file   Social History Narrative  . No narrative on file   Physical Exam: Filed Vitals:   03/27/14 1324  BP: 120/78  Pulse: 65  Height: 5\' 8"  (1.727 m)  Weight: 224 lb (101.606 kg)  SpO2: 95%    GEN- The patient is well appearing, alert and oriented x 3 today.   Head- normocephalic, atraumatic Eyes-  Sclera clear, conjunctiva pink Ears- hearing intact Oropharynx-  clear Neck- supple,  Lungs- Clear to ausculation bilaterally, normal work of breathing Chest- pacemaker pocket is well healed Heart- Regular rate and rhythm, no murmurs, rubs or gallops, PMI not laterally displaced GI- soft, NT, ND, + BS Extremities- no clubbing, cyanosis,  + chronic venous stasis changes with 1+ edema  A/P 1. Symptomatic bradycardia Pacemaker has reached ERI battery status See Claudia Desanctis Art report No changes today Today we discussed risks, benefits, and alternatives to PPM generator change.  He wishes to proceed at the next available time.  2. CAD No ischemic symptoms  3. HTN Stable No change required today  4. Obesity Weight loss advised

## 2014-03-30 NOTE — Discharge Instructions (Signed)
Pacemaker Battery Change, Care After °Refer to this sheet in the next few weeks. These instructions provide you with information on caring for yourself after your procedure. Your health care provider may also give you more specific instructions. Your treatment has been planned according to current medical practices, but problems sometimes occur. Call your health care provider if you have any problems or questions after your procedure. °WHAT TO EXPECT AFTER THE PROCEDURE °After your procedure, it is typical to have the following sensations: °· Soreness at the pacemaker site. °HOME CARE INSTRUCTIONS  °· Keep the incision clean and dry. °· Unless advised otherwise, you may shower beginning 48 hours after your procedure. °· For the first week after the replacement, avoid stretching motions that pull at the incision site, and avoid heavy exercise with the arm that is on the same side as the incision. °· Take medicines only as directed by your health care provider. °· Keep all follow-up visits as directed by your health care provider. °SEEK MEDICAL CARE IF:  °· You have pain at the incision site that is not relieved by over-the-counter or prescription medicine. °· There is drainage or pus from the incision site. °· There is swelling larger than a lime at the incision site. °· You develop red streaking that extends above or below the incision site. °· You feel brief, intermittent palpitations, light-headedness, or any symptoms that you feel might be related to your heart. °SEEK IMMEDIATE MEDICAL CARE IF:  °· You experience chest pain that is different than the pain at the pacemaker site. °· You experience shortness of breath. °· You have palpitations or irregular heartbeat. °· You have light-headedness that does not go away quickly. °· You faint. °· You have pain that gets worse and is not relieved by medicine. °Document Released: 05/07/2013 Document Revised: 12/01/2013 Document Reviewed: 05/07/2013 °ExitCare® Patient  Information ©2015 [employer]. This information is not intended to replace advice given to you by your health care provider. Make sure you discuss any questions you have with your health care provider. ° °

## 2014-04-02 ENCOUNTER — Encounter: Payer: Self-pay | Admitting: Internal Medicine

## 2014-04-09 ENCOUNTER — Ambulatory Visit (INDEPENDENT_AMBULATORY_CARE_PROVIDER_SITE_OTHER): Payer: Medicare Other | Admitting: *Deleted

## 2014-04-09 DIAGNOSIS — I498 Other specified cardiac arrhythmias: Secondary | ICD-10-CM

## 2014-04-09 LAB — MDC_IDC_ENUM_SESS_TYPE_INCLINIC
Battery Impedance: 100 Ohm
Battery Remaining Longevity: 155 mo
Battery Voltage: 2.79 V
Brady Statistic AP VP Percent: 0 %
Brady Statistic AS VS Percent: 12 %
Lead Channel Impedance Value: 368 Ohm
Lead Channel Pacing Threshold Pulse Width: 0.4 ms
Lead Channel Sensing Intrinsic Amplitude: 2 mV
Lead Channel Setting Pacing Amplitude: 1.75 V
Lead Channel Setting Pacing Amplitude: 2 V
Lead Channel Setting Pacing Pulse Width: 0.4 ms
Lead Channel Setting Sensing Sensitivity: 2.8 mV
MDC IDC MSMT LEADCHNL RA PACING THRESHOLD AMPLITUDE: 0.75 V
MDC IDC MSMT LEADCHNL RA PACING THRESHOLD PULSEWIDTH: 0.4 ms
MDC IDC MSMT LEADCHNL RV IMPEDANCE VALUE: 409 Ohm
MDC IDC MSMT LEADCHNL RV PACING THRESHOLD AMPLITUDE: 0.5 V
MDC IDC MSMT LEADCHNL RV SENSING INTR AMPL: 8 mV
MDC IDC SESS DTM: 20150910114914
MDC IDC STAT BRADY AP VS PERCENT: 88 %
MDC IDC STAT BRADY AS VP PERCENT: 0 %

## 2014-04-09 NOTE — Progress Notes (Signed)
Wound check appointment. Steri-strips removed. Wound without redness or edema. Incision edges approximated, wound well healed. Normal device function. Thresholds, sensing, and impedances consistent with implant measurements. Device programmed at 3.5V/auto capture programmed on for extra safety margin until 3 month visit. Histogram distribution appropriate for patient and level of activity. No mode switches.  1 high ventricular rates noted 5 seconds. Patient educated about wound care, arm mobility, lifting restrictions. ROV in 3 months with implanting physician.  ROV in December with Dr. Rayann Heman in [place].

## 2014-04-15 ENCOUNTER — Encounter: Payer: Self-pay | Admitting: Internal Medicine

## 2014-07-09 ENCOUNTER — Encounter (HOSPITAL_COMMUNITY): Payer: Self-pay | Admitting: Internal Medicine

## 2014-07-10 ENCOUNTER — Ambulatory Visit (INDEPENDENT_AMBULATORY_CARE_PROVIDER_SITE_OTHER): Payer: Medicare Other | Admitting: Internal Medicine

## 2014-07-10 ENCOUNTER — Encounter: Payer: Self-pay | Admitting: Internal Medicine

## 2014-07-10 VITALS — BP 158/83 | HR 69 | Ht 68.0 in | Wt 243.8 lb

## 2014-07-10 DIAGNOSIS — I2510 Atherosclerotic heart disease of native coronary artery without angina pectoris: Secondary | ICD-10-CM

## 2014-07-10 DIAGNOSIS — I10 Essential (primary) hypertension: Secondary | ICD-10-CM

## 2014-07-10 DIAGNOSIS — I25810 Atherosclerosis of coronary artery bypass graft(s) without angina pectoris: Secondary | ICD-10-CM

## 2014-07-10 DIAGNOSIS — I495 Sick sinus syndrome: Secondary | ICD-10-CM

## 2014-07-10 DIAGNOSIS — I4430 Unspecified atrioventricular block: Secondary | ICD-10-CM

## 2014-07-10 LAB — MDC_IDC_ENUM_SESS_TYPE_INCLINIC
Battery Impedance: 100 Ohm
Brady Statistic AP VS Percent: 86 %
Brady Statistic AS VS Percent: 14 %
Date Time Interrogation Session: 20151211100152
Lead Channel Impedance Value: 364 Ohm
Lead Channel Impedance Value: 426 Ohm
Lead Channel Pacing Threshold Amplitude: 1 V
Lead Channel Pacing Threshold Pulse Width: 0.4 ms
Lead Channel Pacing Threshold Pulse Width: 0.4 ms
Lead Channel Sensing Intrinsic Amplitude: 8 mV
Lead Channel Setting Pacing Amplitude: 2 V
Lead Channel Setting Sensing Sensitivity: 2.8 mV
MDC IDC MSMT BATTERY REMAINING LONGEVITY: 155 mo
MDC IDC MSMT BATTERY VOLTAGE: 2.79 V
MDC IDC MSMT LEADCHNL RA SENSING INTR AMPL: 2.8 mV
MDC IDC MSMT LEADCHNL RV PACING THRESHOLD AMPLITUDE: 0.5 V
MDC IDC SET LEADCHNL RV PACING AMPLITUDE: 2.5 V
MDC IDC SET LEADCHNL RV PACING PULSEWIDTH: 0.4 ms
MDC IDC STAT BRADY AP VP PERCENT: 0 %
MDC IDC STAT BRADY AS VP PERCENT: 0 %

## 2014-07-10 NOTE — Patient Instructions (Signed)
Your physician recommends that you schedule a follow-up appointment in: 1 year with Dr. Rayann Heman. You will receive a reminder letter in the mail in about 10 months reminding you to call and schedule your appointment. If you don't receive this letter, please contact our office. Carelink device check 10/08/14. Your physician recommends that you continue on your current medications as directed. Please refer to the Current Medication list given to you today. Please follow the 2 g sodium diet given to you today.

## 2014-07-11 ENCOUNTER — Encounter: Payer: Self-pay | Admitting: Internal Medicine

## 2014-07-11 NOTE — Progress Notes (Signed)
PCP: Monico Blitz, MD  The patient presents today for cardiology/ electrophysiology followup.  Since his recent generator change he reports doing very well.  His fatigue has resolved.  He is again active. Today, he denies symptoms of palpitations, chest pain, orthopnea, PND, lower extremity edema, dizziness, presyncope, syncope, or neurologic sequela.  The patient feels that he is tolerating medications without difficulties and is otherwise without complaint today.   Past Medical History  Diagnosis Date  . Coronary artery disease     Status post CABG  . Hypertension   . Diabetes mellitus     DM2  . Sleep apnea     USING CPAP  . SSS (sick sinus syndrome)     s/p PPM  . Spinal stenosis   . Osteoarthritis   . Obesity   . Dyslipidemia    Past Surgical History  Procedure Laterality Date  . Coronary artery bypass graft    . L5-s1 gill decompressive laminectomy    . Right total hip arthroplasty.    . Pacemaker insertion      MDT implanted for sick sinus syndrome  . Permanent pacemaker generator change N/A 03/30/2014    MDT Adapta L generator change by Dr Rayann Heman    Current Outpatient Prescriptions  Medication Sig Dispense Refill  . aspirin EC 81 MG tablet Take 81 mg by mouth daily.    [name] atenolol (TENORMIN) 50 MG tablet Take 25 mg by mouth daily.     [name] atorvastatin (LIPITOR) 40 MG tablet Take 40 mg by mouth daily.    [name] glimepiride (AMARYL) 4 MG tablet Take 2 mg by mouth daily.     [name] losartan (COZAAR) 100 MG tablet Take 100 mg by mouth daily.    . Multiple Vitamin (MULTIVITAMIN) tablet Take 1 tablet by mouth daily.     . nitroGLYCERIN (NITROSTAT) 0.4 MG SL tablet Place 0.4 mg under the tongue every 5 (five) minutes as needed for chest pain.    . Omega-3 Fatty Acids (FISH OIL) 1200 MG CAPS Take 1,200 mg by mouth 2 (two) times daily.    . pioglitazone (ACTOS) 15 MG tablet Take 15 mg by mouth daily.     . Tetrahydrozoline HCl (EYE DROPS OP) Apply 1-2 drops to eye daily as needed (for dry  eyes).     No current facility-administered medications for this visit.    No Known Allergies  History   Social History  . Marital Status: Married    Spouse Name: N/A    Number of Children: N/A  . Years of Education: N/A   Occupational History  . 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 Smoking status: Former Smoker -- 1.00 packs/day for 42 years    Types: Cigarettes    Start date: 01/28/1961    Quit date: 10/30/1998  . Smokeless tobacco: Never Used  . Alcohol Use: No  . Drug Use: No  . Sexual Activity: Not on file   Other Topics Concern  . Not on file   Social History Narrative   Physical Exam: Filed Vitals:   07/10/14 0935  BP: 158/83  Pulse: 69  Height: 5\' 8"  (1.727 m)  Weight: 243 lb 12.8 oz (110.587 kg)  SpO2: 99%    GEN- The patient is overweight appearing, alert and oriented x 3 today.   Head- normocephalic, atraumatic Eyes-  Sclera clear, conjunctiva pink Ears- hearing intact Oropharynx- clear Neck- supple,  Lungs- Clear to ausculation bilaterally, normal work  of breathing Chest- pacemaker pocket is well healed Heart- Regular rate and rhythm, no murmurs, rubs or gallops, PMI not laterally displaced GI- soft, NT, ND, + BS Extremities- no clubbing, cyanosis,  + chronic venous stasis changes with 1+ edema  A/P 1. Symptomatic bradycardia Normal pacemaker function See Pace Art report No changes today  2. CAD No ischemic symptoms  3. HTN Elevated 2 gram sodium diet and lifestyle modification including weight reduction are advised  4. Obesity Weight loss advised  carelink Return to see me in 1 year

## 2014-10-08 ENCOUNTER — Telehealth: Payer: Self-pay | Admitting: Cardiology

## 2014-10-08 ENCOUNTER — Encounter: Payer: Self-pay | Admitting: Internal Medicine

## 2014-10-08 ENCOUNTER — Ambulatory Visit (INDEPENDENT_AMBULATORY_CARE_PROVIDER_SITE_OTHER): Payer: Medicare Other | Admitting: *Deleted

## 2014-10-08 DIAGNOSIS — I495 Sick sinus syndrome: Secondary | ICD-10-CM

## 2014-10-08 NOTE — Telephone Encounter (Signed)
Confirmed remote transmission w/ pt wife.   

## 2014-10-08 NOTE — Progress Notes (Signed)
Remote pacemaker transmission.   

## 2014-10-11 LAB — MDC_IDC_ENUM_SESS_TYPE_REMOTE
Battery Impedance: 100 Ohm
Battery Remaining Longevity: 143 mo
Battery Voltage: 2.79 V
Brady Statistic AP VS Percent: 84 %
Brady Statistic AS VP Percent: 0 %
Date Time Interrogation Session: 20160310165533
Lead Channel Impedance Value: 379 Ohm
Lead Channel Impedance Value: 438 Ohm
Lead Channel Pacing Threshold Amplitude: 0.625 V
Lead Channel Pacing Threshold Amplitude: 0.875 V
Lead Channel Pacing Threshold Pulse Width: 0.4 ms
Lead Channel Pacing Threshold Pulse Width: 0.4 ms
Lead Channel Sensing Intrinsic Amplitude: 5.6 mV
Lead Channel Setting Pacing Amplitude: 2 V
Lead Channel Setting Sensing Sensitivity: 2.8 mV
MDC IDC SET LEADCHNL RV PACING AMPLITUDE: 2.5 V
MDC IDC SET LEADCHNL RV PACING PULSEWIDTH: 0.4 ms
MDC IDC STAT BRADY AP VP PERCENT: 0 %
MDC IDC STAT BRADY AS VS PERCENT: 16 %

## 2014-10-20 ENCOUNTER — Encounter: Payer: Self-pay | Admitting: *Deleted

## 2015-01-11 ENCOUNTER — Ambulatory Visit (INDEPENDENT_AMBULATORY_CARE_PROVIDER_SITE_OTHER): Payer: Medicare Other | Admitting: *Deleted

## 2015-01-11 ENCOUNTER — Encounter: Payer: Self-pay | Admitting: Internal Medicine

## 2015-01-11 DIAGNOSIS — I495 Sick sinus syndrome: Secondary | ICD-10-CM

## 2015-01-11 NOTE — Progress Notes (Signed)
Remote pacemaker transmission.   

## 2015-01-13 LAB — CUP PACEART REMOTE DEVICE CHECK
Battery Impedance: 108 Ohm
Battery Voltage: 2.79 V
Brady Statistic AP VP Percent: 0 %
Brady Statistic AP VS Percent: 85 %
Date Time Interrogation Session: 20160613113533
Lead Channel Pacing Threshold Amplitude: 0.875 V
Lead Channel Pacing Threshold Pulse Width: 0.4 ms
Lead Channel Pacing Threshold Pulse Width: 0.4 ms
Lead Channel Setting Pacing Amplitude: 2.5 V
Lead Channel Setting Pacing Pulse Width: 0.4 ms
MDC IDC MSMT BATTERY REMAINING LONGEVITY: 139 mo
MDC IDC MSMT LEADCHNL RA IMPEDANCE VALUE: 379 Ohm
MDC IDC MSMT LEADCHNL RV IMPEDANCE VALUE: 448 Ohm
MDC IDC MSMT LEADCHNL RV PACING THRESHOLD AMPLITUDE: 0.5 V
MDC IDC MSMT LEADCHNL RV SENSING INTR AMPL: 8 mV
MDC IDC SET LEADCHNL RA PACING AMPLITUDE: 2 V
MDC IDC SET LEADCHNL RV SENSING SENSITIVITY: 2.8 mV
MDC IDC STAT BRADY AS VP PERCENT: 0 %
MDC IDC STAT BRADY AS VS PERCENT: 15 %

## 2015-04-12 ENCOUNTER — Ambulatory Visit (INDEPENDENT_AMBULATORY_CARE_PROVIDER_SITE_OTHER): Payer: Medicare Other | Admitting: *Deleted

## 2015-04-12 DIAGNOSIS — I495 Sick sinus syndrome: Secondary | ICD-10-CM

## 2015-04-12 NOTE — Progress Notes (Signed)
Remote pacemaker transmission.   

## 2015-04-21 LAB — CUP PACEART REMOTE DEVICE CHECK
Brady Statistic AP VP Percent: 0 %
Brady Statistic AP VS Percent: 86 %
Brady Statistic AS VP Percent: 0 %
Date Time Interrogation Session: 20160912112451
Implantable Lead Implant Date: 20000419
Implantable Lead Implant Date: 20000419
Implantable Lead Location: 753859
Implantable Lead Model: 4023
Lead Channel Impedance Value: 464 Ohm
Lead Channel Pacing Threshold Amplitude: 0.625 V
Lead Channel Pacing Threshold Pulse Width: 0.4 ms
Lead Channel Pacing Threshold Pulse Width: 0.4 ms
Lead Channel Setting Pacing Amplitude: 2 V
Lead Channel Setting Sensing Sensitivity: 2.8 mV
MDC IDC LEAD LOCATION: 753860
MDC IDC MSMT BATTERY IMPEDANCE: 100 Ohm
MDC IDC MSMT BATTERY REMAINING LONGEVITY: 142 mo
MDC IDC MSMT BATTERY VOLTAGE: 2.79 V
MDC IDC MSMT LEADCHNL RA IMPEDANCE VALUE: 378 Ohm
MDC IDC MSMT LEADCHNL RA PACING THRESHOLD AMPLITUDE: 0.875 V
MDC IDC MSMT LEADCHNL RV SENSING INTR AMPL: 8 mV
MDC IDC SET LEADCHNL RV PACING AMPLITUDE: 2.5 V
MDC IDC SET LEADCHNL RV PACING PULSEWIDTH: 0.4 ms
MDC IDC STAT BRADY AS VS PERCENT: 14 %

## 2015-04-27 ENCOUNTER — Encounter: Payer: Self-pay | Admitting: Cardiology

## 2015-05-04 ENCOUNTER — Encounter: Payer: Self-pay | Admitting: Internal Medicine

## 2015-08-06 ENCOUNTER — Ambulatory Visit (INDEPENDENT_AMBULATORY_CARE_PROVIDER_SITE_OTHER): Payer: Medicare Other | Admitting: Internal Medicine

## 2015-08-06 ENCOUNTER — Encounter: Payer: Self-pay | Admitting: Internal Medicine

## 2015-08-06 VITALS — BP 122/90 | HR 78 | Ht 67.0 in | Wt 248.0 lb

## 2015-08-06 DIAGNOSIS — I10 Essential (primary) hypertension: Secondary | ICD-10-CM

## 2015-08-06 DIAGNOSIS — I495 Sick sinus syndrome: Secondary | ICD-10-CM | POA: Diagnosis not present

## 2015-08-06 DIAGNOSIS — I4430 Unspecified atrioventricular block: Secondary | ICD-10-CM

## 2015-08-06 LAB — CUP PACEART INCLINIC DEVICE CHECK
Battery Impedance: 109 Ohm
Brady Statistic AP VP Percent: 0 %
Brady Statistic AP VS Percent: 85 %
Brady Statistic AS VS Percent: 15 %
Implantable Lead Implant Date: 20000419
Implantable Lead Location: 753859
Lead Channel Impedance Value: 462 Ohm
Lead Channel Pacing Threshold Amplitude: 0.75 V
Lead Channel Pacing Threshold Pulse Width: 0.4 ms
Lead Channel Sensing Intrinsic Amplitude: 2.8 mV
Lead Channel Sensing Intrinsic Amplitude: 8 mV
Lead Channel Setting Pacing Pulse Width: 0.4 ms
Lead Channel Setting Sensing Sensitivity: 2.8 mV
MDC IDC LEAD IMPLANT DT: 20000419
MDC IDC LEAD LOCATION: 753860
MDC IDC MSMT BATTERY REMAINING LONGEVITY: 139 mo
MDC IDC MSMT BATTERY VOLTAGE: 2.79 V
MDC IDC MSMT LEADCHNL RA IMPEDANCE VALUE: 373 Ohm
MDC IDC MSMT LEADCHNL RA PACING THRESHOLD PULSEWIDTH: 0.4 ms
MDC IDC MSMT LEADCHNL RV PACING THRESHOLD AMPLITUDE: 0.5 V
MDC IDC SESS DTM: 20170106132128
MDC IDC SET LEADCHNL RA PACING AMPLITUDE: 2 V
MDC IDC SET LEADCHNL RV PACING AMPLITUDE: 2.5 V
MDC IDC STAT BRADY AS VP PERCENT: 0 %

## 2015-08-06 NOTE — Progress Notes (Signed)
PCP: Monico Blitz, MD  The patient presents today for cardiology/ electrophysiology followup.  Since his recent generator change he reports doing very well.  He has occasional SOB.  He attributes this to being overweight and not very active due to chronic back pain.  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  He had a very nice Christmas with more than 20 family members over for dinner. Today, he denies symptoms of palpitations, chest pain, orthopnea, PND, lower extremity edema, dizziness, presyncope, syncope, or neurologic sequela.  The patient feels that he is tolerating medications without difficulties and is otherwise without complaint today.   Past Medical History  Diagnosis Date  . Coronary artery disease     Status post CABG  . Hypertension   . Diabetes mellitus     DM2  . Sleep apnea     USING CPAP  . SSS (sick sinus syndrome) ([facility])     s/p PPM  . Spinal stenosis   . Osteoarthritis   . Obesity   . Dyslipidemia    Past Surgical History  Procedure Laterality Date  . Coronary artery bypass graft    . L5-s1 gill decompressive laminectomy    . Right total hip arthroplasty.    . Pacemaker insertion      MDT implanted for sick sinus syndrome  . Permanent pacemaker generator change N/A 03/30/2014    MDT Adapta L generator change by Dr Rayann Heman    Current Outpatient Prescriptions  Medication Sig Dispense Refill  . aspirin EC 81 MG tablet Take 81 mg by mouth daily.    [name] atenolol (TENORMIN) 50 MG tablet Take 25 mg by mouth daily.     [name] atorvastatin (LIPITOR) 40 MG tablet Take 40 mg by mouth daily.    . diclofenac (VOLTAREN) 75 MG EC tablet Take 75 mg by mouth 2 (two) times daily.    [name] glimepiride (AMARYL) 4 MG tablet Take 2 mg by mouth daily.     [name] losartan (COZAAR) 100 MG tablet Take 100 mg by mouth daily.    . Multiple Vitamin (MULTIVITAMIN) tablet Take 1 tablet by mouth daily.     . nitroGLYCERIN (NITROSTAT) 0.4 MG SL tablet Place 0.4 mg under the  tongue every 5 (five) minutes as needed for chest pain.    . Omega-3 Fatty Acids (FISH OIL) 1200 MG CAPS Take 1,200 mg by mouth 2 (two) times daily.    . pioglitazone (ACTOS) 15 MG tablet Take 15 mg by mouth daily.     . Tetrahydrozoline HCl (EYE DROPS OP) Apply 1-2 drops to eye daily as needed (for dry eyes).     No current facility-administered medications for this visit.    No Known Allergies  Social History   Social History  . Marital Status: Married    Spouse Name: N/A  . Number of Children: N/A  . Years of Education: N/A   Occupational History  . 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 Smoking status: Former Smoker -- 1.00 packs/day for 42 years    Types: Cigarettes    Start date: 01/28/1961    Quit date: 10/30/1998  . Smokeless tobacco: Never Used  . Alcohol Use: No  . Drug Use: No  . Sexual Activity: Not on file   Other Topics Concern  . Not on file   Social History Narrative   Physical Exam: Filed Vitals:   08/06/15 1139  BP:  122/90  Pulse: 78  Height: 5\' 7"  (1.702 m)  Weight: 248 lb (112.492 kg)  SpO2: 98%    GEN- The patient is overweight appearing, alert and oriented x 3 today.   Head- normocephalic, atraumatic Eyes-  Sclera clear, conjunctiva pink Ears- hearing intact Oropharynx- clear Neck- supple,  Lungs- Clear to ausculation bilaterally, normal work of breathing Chest- pacemaker pocket is well healed Heart- Regular rate and rhythm, no murmurs, rubs or gallops, PMI not laterally displaced GI- soft, NT, ND, + BS Extremities- no clubbing, cyanosis,  + chronic venous stasis changes with 1+ edema  A/P 1. Symptomatic bradycardia Normal pacemaker function RV lead is a unipolar lead.  There is some RV lead noise however this is stable and he does not V pace. See Pace Art report No changes today  2. CAD No ischemic symptoms  3. HTN Stable No change required today  4. Obesity Body mass index is 38.83  kg/(m^2). Weight loss advised  carelink Return to see me in 1 year  Thompson Grayer MD, [facility] 08/06/2015 12:04 PM

## 2015-08-06 NOTE — Patient Instructions (Signed)
Your physician recommends that you continue on your current medications as directed. Please refer to the Current Medication list given to you today. Device check on 11/08/15. Your physician recommends that you schedule a follow-up appointment in: 1 year with Dr. Rayann Heman. You can schedule this appointment today or you can wait for your letter to come in the mail in about 10 months reminding you to call and schedule this appointment. If you do not receive this letter, please contact our office for your appointment.

## 2015-10-22 DIAGNOSIS — Z6841 Body Mass Index (BMI) 40.0 and over, adult: Secondary | ICD-10-CM | POA: Diagnosis not present

## 2015-10-22 DIAGNOSIS — Z87891 Personal history of nicotine dependence: Secondary | ICD-10-CM | POA: Diagnosis not present

## 2015-10-22 DIAGNOSIS — M21371 Foot drop, right foot: Secondary | ICD-10-CM | POA: Diagnosis not present

## 2015-10-22 DIAGNOSIS — E1142 Type 2 diabetes mellitus with diabetic polyneuropathy: Secondary | ICD-10-CM | POA: Diagnosis not present

## 2015-11-08 ENCOUNTER — Telehealth: Payer: Self-pay | Admitting: Cardiology

## 2015-11-08 ENCOUNTER — Ambulatory Visit (INDEPENDENT_AMBULATORY_CARE_PROVIDER_SITE_OTHER): Payer: Medicare Other | Admitting: *Deleted

## 2015-11-08 DIAGNOSIS — I495 Sick sinus syndrome: Secondary | ICD-10-CM | POA: Diagnosis not present

## 2015-11-08 NOTE — Progress Notes (Signed)
Remote pacemaker transmission.   

## 2015-11-08 NOTE — Telephone Encounter (Signed)
Spoke with pt and reminded pt of remote transmission that is due today. Pt verbalized understanding.   

## 2015-11-10 DIAGNOSIS — E1142 Type 2 diabetes mellitus with diabetic polyneuropathy: Secondary | ICD-10-CM | POA: Diagnosis not present

## 2015-11-10 DIAGNOSIS — I10 Essential (primary) hypertension: Secondary | ICD-10-CM | POA: Diagnosis not present

## 2015-11-10 DIAGNOSIS — M21371 Foot drop, right foot: Secondary | ICD-10-CM | POA: Diagnosis not present

## 2015-11-10 DIAGNOSIS — E7800 Pure hypercholesterolemia, unspecified: Secondary | ICD-10-CM | POA: Diagnosis not present

## 2015-11-10 DIAGNOSIS — M25512 Pain in left shoulder: Secondary | ICD-10-CM | POA: Diagnosis not present

## 2015-11-18 ENCOUNTER — Encounter: Payer: Self-pay | Admitting: Neurology

## 2015-11-18 ENCOUNTER — Ambulatory Visit (INDEPENDENT_AMBULATORY_CARE_PROVIDER_SITE_OTHER): Payer: Medicare Other | Admitting: Neurology

## 2015-11-18 VITALS — BP 140/88 | HR 80 | Ht 67.0 in | Wt 244.4 lb

## 2015-11-18 DIAGNOSIS — Z9889 Other specified postprocedural states: Secondary | ICD-10-CM | POA: Diagnosis not present

## 2015-11-18 DIAGNOSIS — M21371 Foot drop, right foot: Secondary | ICD-10-CM | POA: Diagnosis not present

## 2015-11-18 DIAGNOSIS — M5417 Radiculopathy, lumbosacral region: Secondary | ICD-10-CM | POA: Diagnosis not present

## 2015-11-18 DIAGNOSIS — E0842 Diabetes mellitus due to underlying condition with diabetic polyneuropathy: Secondary | ICD-10-CM | POA: Insufficient documentation

## 2015-11-18 MED ORDER — CYCLOBENZAPRINE HCL 5 MG PO TABS: 5.0000 mg | ORAL_TABLET | Freq: Every evening | ORAL | Status: DC | PRN

## 2015-11-18 MED ORDER — AMBULATORY NON FORMULARY MEDICATION: 1.0000 [IU] | Freq: Every day | Status: DC

## 2015-11-18 NOTE — Patient Instructions (Addendum)
1.  CT lumbar spine without contrast at [facility] 2.  Start cyclobenzaprine 5mg  at bedtime for muscle cramps 3.  Start using a right ankle foot orthotic - please take the prescription to orthotics facility to have you fitted up 4.  We will refer you to see Dr. Luiz Ochoa   We will call you with the results.

## 2015-11-18 NOTE — Progress Notes (Signed)
[facility] Clinic Note - Initial Visit   Date: 11/18/2015  GEROGE Nolan MRN: [id number] DOB: June 24, 1941   Dear Dr. Manuella Ghazi:  Thank you for your kind referral of [name] for consultation of foot drop. Although his history is well known to you, please allow [place] to reiterate it for the purpose of our medical record. The patient was accompanied to the clinic by self.    History of Present Illness: [name] is a 75 y.o. right-handed Caucasian male with CAD s/p CABG (2000), sick sinus syndrome s/p PPM, hypertension, diabetes mellitus, OSA, hyperlipidemia, and s/p lumbar decompression and fusion x 3 presenting for evaluation of right foot drop.    In February, he was in the kitchen with his wife and suddenly developed weakness of his right ankle and since then noticed that he walks with a floppy foot and it does not have as much strength.  There was no associated of the foot.  He has chronic low back pain attributed to his previous three back surgeries, but he has not noticed any worsening pain.  He has associated numbness/tingling over the dorsum of the right foot.  He has mild tingling of the left foot, but denies any weakness.   He does not cross his legs when sitting or wear any tight stockings to the level of the knees.  No bowel/bladder incontinence.   Out-side paper records, electronic medical record, and images have been reviewed where available and summarized as:  CT myelogram 08/01/2006: 1. Abdominal aortic aneurysm measuring 2.8 cm, unchanged.  2. Satisfactory pedicle screw and interbody fusion of L4-5 and L5-S1.  3. Moderate to large central disc protrusion at T11-T12 not covered on the prior study.  4. Moderate to large central left-sided disc protrusion at T12-L1 unchanged.  5. Diffuse disc protrusion at L1-2 has improved in the interval.  6. Diffuse disc protrusion at L2-3 with focal caudal migration of fragment on the right  which may be larger than on the prior study.  7. Diffuse disc protrusion at L3-4 with a focal fragment of disc on the left which is larger than on the prior study. There is progression of spinal stenosis which is moderately severe at L3-4.   Lab Results  Component Value Date   NA 139 03/30/2014   K 4.9 03/30/2014   CL 106 03/30/2014   CO2 20 03/30/2014     Past Medical History  Diagnosis Date  . Coronary artery disease 2000    Status post CABG  . Hypertension   . Diabetes mellitus     DM2  . Sleep apnea     USING CPAP  . SSS (sick sinus syndrome) ([place]) 2000    s/p PPM  . Spinal stenosis   . Osteoarthritis   . Obesity   . Dyslipidemia     Past Surgical History  Procedure Laterality Date  . Coronary artery bypass graft    . L5-s1 gill decompressive laminectomy    . Right total hip arthroplasty.    . Pacemaker insertion      MDT implanted for sick sinus syndrome  . Permanent pacemaker generator change N/A 03/30/2014    MDT Adapta L generator change by Dr Rayann Heman     Medications:  Outpatient Encounter Prescriptions as of 11/18/2015  Medication Sig  . AMBULATORY NON FORMULARY MEDICATION 1 Units by Other route daily. Right foot AFO  . aspirin EC 81 MG tablet Take 81 mg by mouth daily.  [name] atenolol (  TENORMIN) 50 MG tablet Take 25 mg by mouth daily.   [name] atorvastatin (LIPITOR) 40 MG tablet Take 40 mg by mouth daily.  . cyclobenzaprine (FLEXERIL) 5 MG tablet Take 1 tablet (5 mg total) by mouth at bedtime as needed for muscle spasms.  . diclofenac (VOLTAREN) 75 MG EC tablet Take 75 mg by mouth 2 (two) times daily.  [name] glimepiride (AMARYL) 4 MG tablet Take 2 mg by mouth daily.   [name] losartan (COZAAR) 100 MG tablet Take 100 mg by mouth daily.  . Multiple Vitamin (MULTIVITAMIN) tablet Take 1 tablet by mouth daily.   . nitroGLYCERIN (NITROSTAT) 0.4 MG SL tablet Place 0.4 mg under the tongue every 5 (five) minutes as needed for chest pain.  . Omega-3 Fatty Acids (FISH OIL) 1200  MG CAPS Take 1,200 mg by mouth 2 (two) times daily.  . pioglitazone (ACTOS) 15 MG tablet Take 15 mg by mouth daily.   . Tetrahydrozoline HCl (EYE DROPS OP) Apply 1-2 drops to eye daily as needed (for dry eyes).   No facility-administered encounter medications on file as of 11/18/2015.     Allergies: No Known Allergies  Family History: Family History  Problem Relation Age of Onset  . CAD Father   . Stroke Father     Deceased, 63  . Hypertension Mother   . Stroke Mother     Deceased, 8  . Stroke Brother     Deceased, 81  . Hypertension Sister     Deceased, 80  . Healthy Son   . Healthy Daughter     Social History: Social History  Substance Use Topics  . Smoking status: Former Smoker -- 1.00 packs/day for 42 years    Types: Cigarettes    Start date: 01/28/1961    Quit date: 10/30/1998  . Smokeless tobacco: Never Used  . Alcohol Use: No   Social History   Social History Narrative   Lives with wife in a 2 story home.  They have 2 grown children.     Retired Software engineer.      Review of Systems:  CONSTITUTIONAL: No fevers, chills, night sweats, or weight loss.   EYES: No visual changes or eye pain ENT: No hearing changes.  No history of nose bleeds.   RESPIRATORY: No cough, wheezing and shortness of breath.   CARDIOVASCULAR: Negative for chest pain, and palpitations.   GI: Negative for abdominal discomfort, blood in stools or black stools.  No recent change in bowel habits.   GU:  No history of incontinence.   MUSCLOSKELETAL: +history of joint pain or swelling.  No myalgias.   SKIN: Negative for lesions, rash, and itching.   HEMATOLOGY/ONCOLOGY: Negative for prolonged bleeding, bruising easily, and swollen nodes.    ENDOCRINE: Negative for cold or heat intolerance, polydipsia or goiter.   PSYCH:  No depression or anxiety symptoms.   NEURO: As Above.   Vital Signs:  BP 140/88 mmHg  Pulse 80  Ht 5\' 7"  (1.702 m)  Wt 244 lb 6 oz (110.848 kg)  BMI 38.27  kg/m2   General Medical Exam:   General:  Well appearing, comfortable.   Eyes/ENT: see cranial nerve examination.   Neck: No masses appreciated.  Full range of motion without tenderness.  No carotid bruits. Respiratory:  Clear to auscultation, good air entry bilaterally.   Cardiac:  Regular rate and rhythm, no murmur.   Extremities:  No deformities, edema, or skin discoloration.  Skin:  No rashes or lesions.  Neurological Exam: MENTAL  STATUS including orientation to time, place, person, recent and remote memory, attention span and concentration, language, and fund of knowledge is normal.  Speech is not dysarthric.  CRANIAL NERVES: II:  No visual field defects.  Unremarkable fundi.   III-IV-VI: Pupils equal round and reactive to light.  Normal conjugate, extra-ocular eye movements in all directions of gaze.  No nystagmus.  No ptosis.   V:  Normal facial sensation.    VII:  Normal facial symmetry and movements.  No pathologic facial reflexes.  VIII:  Normal hearing and vestibular function.   IX-X:  Normal palatal movement.   XI:  Normal shoulder shrug and head rotation.   XII:  Normal tongue strength and range of motion, no deviation or fasciculation.  MOTOR:  No atrophy, fasciculations or abnormal movements.  No pronator drift.  Tone is normal.    Right Upper Extremity:    Left Upper Extremity:    Deltoid  5/5   Deltoid  5/5   Biceps  5/5   Biceps  5/5   Triceps  5/5   Triceps  5/5   Wrist extensors  5/5   Wrist extensors  5/5   Wrist flexors  5/5   Wrist flexors  5/5   Finger extensors  5/5   Finger extensors  5/5   Finger flexors  5/5   Finger flexors  5/5   Dorsal interossei  5/5   Dorsal interossei  5/5   Abductor pollicis  5/5   Abductor pollicis  5/5   Tone (Ashworth scale)  0  Tone (Ashworth scale)  0   Right Lower Extremity:    Left Lower Extremity:    Hip flexors  5/5   Hip flexors  5/5   Hip extensors  5/5   Hip extensors  5/5   Knee flexors  5/5   Knee flexors  5/5    Knee extensors  5/5   Knee extensors  5/5   Inversion 4/5  Inversion 5/5  Eversion 4/5  Eversion 5/5  Dorsiflexors  4/5   Dorsiflexors  5/5   Plantarflexors  5/5   Plantarflexors  5/5   Toe extensors  4/5   Toe extensors  5/5   Toe flexors  5-/5   Toe flexors  5-/5   Tone (Ashworth scale)  0  Tone (Ashworth scale)  0   MSRs:  Right                                                                 Left brachioradialis 2+  brachioradialis 2+  biceps 2+  biceps 2+  triceps 2+  triceps 2+  patellar 3+  patellar 2+  ankle jerk 2+  ankle jerk trace  Hoffman no  Hoffman no  plantar response up  plantar response down  1-2 beat ankle clonus on right only  SENSORY:  Vibration reduced to 50% at ankles bilaterally and absent at right great toe.  Temperature and pin prick seem to be intact. Proprioception impaired bilaterally.  There is mild sway with Rhomberg testing.   COORDINATION/GAIT: Normal finger-to- nose-finger and heel to-shin.  Toe tapping is reduced on the right. Unable to rise from a chair without using arms.  Gait is wide-based, slightly spastic appearing with dragging of the right  foot.   IMPRESSION: 1.  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  Exam shows upper motor neuron findings on the right lower extremity (brisk patella jerk, 1-2 beat clonus, and babinksi) along with weakness following the L5 myotome.  He is unable to get MRI due to PPM, so will proceed with CT lumbar spine.  Because of his previous history of back surgeries, he may ultimately need CT myelogram to better visualize his anatomy and I will have him follow-up with Dr. Luiz Ochoa.  2.  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  Paresthesias are not bothersome enough to start medications.    PLAN/RECOMMENDATIONS:  1.  CT lumbar spine without contrast 2.  Prescription provided for right AFO 3.  Start cyclobenzaprine 5mg  at bedtime for muscle  cramps 4.  Recommend follow-up with Dr. Luiz Ochoa  Further recommendations based on the results of the testing   The duration of this appointment visit was 45 minutes of face-to-face time with the patient.  Greater than 50% of this time was spent in counseling, explanation of diagnosis, planning of further management, and coordination of care.   Thank you for allowing me to participate in patient's care.  If I can answer any additional questions, I would be pleased to do so.    Sincerely,    Nelsy Madonna K. Posey Pronto, DO

## 2015-11-18 NOTE — Progress Notes (Signed)
Note faxed and routed.

## 2015-11-30 DIAGNOSIS — E0842 Diabetes mellitus due to underlying condition with diabetic polyneuropathy: Secondary | ICD-10-CM | POA: Diagnosis not present

## 2015-11-30 DIAGNOSIS — M21371 Foot drop, right foot: Secondary | ICD-10-CM | POA: Diagnosis not present

## 2015-11-30 DIAGNOSIS — M5417 Radiculopathy, lumbosacral region: Secondary | ICD-10-CM | POA: Diagnosis not present

## 2015-11-30 DIAGNOSIS — Z981 Arthrodesis status: Secondary | ICD-10-CM | POA: Diagnosis not present

## 2015-11-30 DIAGNOSIS — I714 Abdominal aortic aneurysm, without rupture: Secondary | ICD-10-CM | POA: Diagnosis not present

## 2015-11-30 DIAGNOSIS — M47817 Spondylosis without myelopathy or radiculopathy, lumbosacral region: Secondary | ICD-10-CM | POA: Diagnosis not present

## 2015-11-30 DIAGNOSIS — Z9889 Other specified postprocedural states: Secondary | ICD-10-CM | POA: Diagnosis not present

## 2015-11-30 LAB — CUP PACEART REMOTE DEVICE CHECK
Battery Voltage: 2.79 V
Brady Statistic AP VP Percent: 0 %
Brady Statistic AP VS Percent: 89 %
Brady Statistic AS VS Percent: 11 %
Implantable Lead Implant Date: 20000419
Implantable Lead Location: 753859
Lead Channel Impedance Value: 463 Ohm
Lead Channel Pacing Threshold Amplitude: 0.625 V
Lead Channel Pacing Threshold Amplitude: 0.875 V
Lead Channel Pacing Threshold Pulse Width: 0.4 ms
Lead Channel Pacing Threshold Pulse Width: 0.4 ms
Lead Channel Setting Pacing Amplitude: 2.5 V
Lead Channel Setting Sensing Sensitivity: 2.8 mV
MDC IDC LEAD IMPLANT DT: 20000419
MDC IDC LEAD LOCATION: 753860
MDC IDC MSMT BATTERY IMPEDANCE: 109 Ohm
MDC IDC MSMT BATTERY REMAINING LONGEVITY: 139 mo
MDC IDC MSMT LEADCHNL RA IMPEDANCE VALUE: 378 Ohm
MDC IDC MSMT LEADCHNL RV SENSING INTR AMPL: 8 mV
MDC IDC SESS DTM: 20170410135211
MDC IDC SET LEADCHNL RA PACING AMPLITUDE: 2 V
MDC IDC SET LEADCHNL RV PACING PULSEWIDTH: 0.4 ms
MDC IDC STAT BRADY AS VP PERCENT: 0 %

## 2015-12-03 ENCOUNTER — Telehealth: Payer: Self-pay | Admitting: Neurology

## 2015-12-03 NOTE — Telephone Encounter (Signed)
Left message for patient to call me back. 

## 2015-12-03 NOTE — Telephone Encounter (Signed)
PT would like a call back in regards to a referral/Dawn CB# [phone]

## 2015-12-03 NOTE — Telephone Encounter (Signed)
Yes

## 2015-12-03 NOTE — Telephone Encounter (Signed)
Will send referral to Dr. Carloyn Manner.

## 2015-12-03 NOTE — Telephone Encounter (Signed)
Patient needs referral to neurosurgeon in [place].  Dr. Carloyn MannerIU:[phone].  Ok to send?

## 2015-12-08 ENCOUNTER — Encounter: Payer: Self-pay | Admitting: Cardiology

## 2015-12-15 ENCOUNTER — Telehealth: Payer: Self-pay | Admitting: Neurology

## 2015-12-15 NOTE — Telephone Encounter (Signed)
CT lumbar spine 11/30/2015 performed at [facility]: 1. Intervertebral and facet spurring related to multilevel fusions, causing prominent impingement at L5-S1 and mild impingement at L3-4 and L4-5. 2.  Partially visualized left > right foraminal impingement at T10-11 level. 3.  The T11 pedicle scres both demonstrate lucency around the screw threatening suggesting loosening or infection. 4.  The right L5 pedicle screws slightly captures the superior cortical margin of the right neural foramen but does not extend significantly into the neural foramen. 5.  Infrarental abdominal aortic aneurysm, potential saccular components, measuring 3.4 cm diameter.  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 Recommend follow-up by ultrasound in 3 years 6.  Left kidney upper pole fluid density lesions, likely cysts.  The above results were communicated with patient via telephone.  He will be seeing Dr. Carloyn Manner, neurosurgeon in [place], on 5/22.  His abdominal aortic aneurysm is known and being followed.    Shelma Eiben K. Posey Pronto, DO

## 2015-12-20 DIAGNOSIS — M4716 Other spondylosis with myelopathy, lumbar region: Secondary | ICD-10-CM | POA: Diagnosis not present

## 2015-12-20 DIAGNOSIS — Q762 Congenital spondylolisthesis: Secondary | ICD-10-CM | POA: Diagnosis not present

## 2015-12-20 DIAGNOSIS — S32009K Unspecified fracture of unspecified lumbar vertebra, subsequent encounter for fracture with nonunion: Secondary | ICD-10-CM | POA: Diagnosis not present

## 2015-12-20 DIAGNOSIS — M4306 Spondylolysis, lumbar region: Secondary | ICD-10-CM | POA: Diagnosis not present

## 2015-12-22 ENCOUNTER — Encounter: Payer: Self-pay | Admitting: Neurology

## 2016-01-06 ENCOUNTER — Encounter: Payer: Self-pay | Admitting: Vascular Surgery

## 2016-01-11 ENCOUNTER — Ambulatory Visit (INDEPENDENT_AMBULATORY_CARE_PROVIDER_SITE_OTHER): Payer: Medicare Other | Admitting: Vascular Surgery

## 2016-01-11 ENCOUNTER — Encounter: Payer: Self-pay | Admitting: Vascular Surgery

## 2016-01-11 VITALS — BP 145/93 | HR 65 | Ht 67.0 in | Wt 241.5 lb

## 2016-01-11 DIAGNOSIS — I714 Abdominal aortic aneurysm, without rupture, unspecified: Secondary | ICD-10-CM

## 2016-01-11 NOTE — Progress Notes (Signed)
Vascular and Vein Specialist of [place]  Patient name: [name] MRN: [id number] DOB: Sep 12, 1940 Sex: male  REASON FOR CONSULT: Abdominal and bilateral iliac artery aneurysms  HPI: [name] is a 75 y.o. male, who is in today for evaluation of abdominal aortic aneurysm and bilateral common iliac artery aneurysms. He reports this was known for several years and was followed with ultrasound. He recently had a CT scan on 11/30/2015 again demonstrating this and is seen today for further discussion. Is no symptoms referable to his aneurysm. Does not recall the size on ultrasound in the past. Does have significant past history of coronary artery degrees status post coronary bypass grafting in 2000. On further discussion he does have rather clear-cut claudication type symptoms in his calf with walking. No history of stroke.  Past Medical History  Diagnosis Date  . Coronary artery disease 2000    Status post CABG  . Hypertension   . Diabetes mellitus     DM2  . Sleep apnea     USING CPAP  . SSS (sick sinus syndrome) ([place]) 2000    s/p PPM  . Spinal stenosis   . Osteoarthritis   . Obesity   . Dyslipidemia   . AAA (abdominal aortic aneurysm) ([place])   . COPD (chronic obstructive pulmonary disease) ([facility])     Family History  Problem Relation Age of Onset  . CAD Father   . Stroke Father     Deceased, 51  . Hypertension Mother   . Stroke Mother     Deceased, 40  . Heart disease Mother   . Stroke Brother     Deceased, 7  . Hypertension Sister     Deceased, 40  . Heart disease Sister   . Healthy Son   . Healthy Daughter     SOCIAL HISTORY: Social History   Social History  . Marital Status: Married    Spouse Name: N/A  . Number of Children: N/A  . Years of Education: N/A   Occupational History  . 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 Smoking status: Former Smoker -- 1.00 packs/day for 42 years   Types: Cigarettes    Start date: 01/28/1961    Quit date: 10/30/1998  . Smokeless tobacco: Never Used  . Alcohol Use: No  . Drug Use: No  . Sexual Activity: Not on file   Other Topics Concern  . Not on file   Social History Narrative   Lives with wife in a 2 story home.  They have 2 grown children.     Retired Software engineer.      No Known Allergies  Current Outpatient Prescriptions  Medication Sig Dispense Refill  . aspirin EC 81 MG tablet Take 81 mg by mouth daily.    [name] atenolol (TENORMIN) 50 MG tablet Take 25 mg by mouth daily.     [name] atorvastatin (LIPITOR) 40 MG tablet Take 40 mg by mouth daily.    . cyclobenzaprine (FLEXERIL) 5 MG tablet Take 1 tablet (5 mg total) by mouth at bedtime as needed for muscle spasms. 30 tablet 5  . diclofenac (VOLTAREN) 75 MG EC tablet Take 75 mg by mouth 2 (two) times daily.    [name] glimepiride (AMARYL) 4 MG tablet Take 2 mg by mouth daily.     [name] losartan (COZAAR) 100 MG tablet Take 100 mg by mouth daily.    . Multiple Vitamin (MULTIVITAMIN) tablet Take 1  tablet by mouth daily.     . Omega-3 Fatty Acids (FISH OIL) 1200 MG CAPS Take 1,200 mg by mouth 2 (two) times daily.    . pioglitazone (ACTOS) 15 MG tablet Take 15 mg by mouth daily.     . Tetrahydrozoline HCl (EYE DROPS OP) Apply 1-2 drops to eye daily as needed (for dry eyes).    . AMBULATORY NON FORMULARY MEDICATION 1 Units by Other route daily. Right foot AFO (Patient not taking: Reported on 01/11/2016) 1 Units 0  . nitroGLYCERIN (NITROSTAT) 0.4 MG SL tablet Place 0.4 mg under the tongue every 5 (five) minutes as needed for chest pain. Reported on 01/11/2016     No current facility-administered medications for this visit.    REVIEW OF SYSTEMS:  [X]  denotes positive finding, [ ]  denotes negative finding Cardiac  Comments:  Chest pain or chest pressure:    Shortness of breath upon exertion: x   Short of breath when lying flat:    Irregular heart rhythm:        Vascular    Pain in calf, thigh,  or hip brought on by ambulation: x   Pain in feet at night that wakes you up from your sleep:     Blood clot in your veins:    Leg swelling:         Pulmonary    Oxygen at home:    Productive cough:     Wheezing:         Neurologic    Sudden weakness in arms or legs:     Sudden numbness in arms or legs:     Sudden onset of difficulty speaking or slurred speech:    Temporary loss of vision in one eye:     Problems with dizziness:         Gastrointestinal    Blood in stool:     Vomited blood:         Genitourinary    Burning when urinating:     Blood in urine:        Psychiatric    Major depression:         Hematologic    Bleeding problems:    Problems with blood clotting too easily:        Skin    Rashes or ulcers:        Constitutional    Fever or chills:      PHYSICAL EXAM: Filed Vitals:   01/11/16 1052 01/11/16 1056  BP: 175/96 145/93  Pulse: 65   Height: 5\' 7"  (1.702 m)   Weight: 241 lb 8 oz (109.544 kg)   SpO2: 94%     GENERAL: The patient is a well-nourished male, in no acute distress. The vital signs are documented above. CARDIAC: There is a regular rate and rhythm.  VASCULAR: 2+ radial pulses bilaterally 1-2+ femoral pulses bilaterally. Absent popliteal and distal pulses bilaterally PULMONARY: There is good air exchange bilaterally without wheezing or rales. ABDOMEN: Soft and non-tender with normal pitched bowel sounds. No aneurysm palpable MUSCULOSKELETAL: There are no major deformities or cyanosis. NEUROLOGIC: No focal weakness or paresthesias are detected. SKIN: There are no ulcers or rashes noted. PSYCHIATRIC: The patient has a normal affect.  DATA:  I reviewed [facility]. This shows a 3.4 cm infrarenal abdominal aortic aneurysm. He does have bilateral common iliac artery aneurysms as well. Left is bigger than the right and the maximal size on the left is 2.5 cm.  MEDICAL ISSUES: Asymptomatic moderate aortic and iliac  artery aneurysms. Discussed the significance of this with the patient. Recommended yearly ultrasound for continued follow-up of this. I explained the very slight risk of rupture and symptoms to note. Explain the critical importance of calling 911 and report immediately to the hospital should this occur. We'll see him again in one year for continued follow-up area to explain that with his small to moderate size at this poses a slight to moderate lifelong risk.   Rosetta Posner, MD FACS [facility] Tel [phone] Pager [phone]

## 2016-01-31 DIAGNOSIS — Z299 Encounter for prophylactic measures, unspecified: Secondary | ICD-10-CM | POA: Diagnosis not present

## 2016-01-31 DIAGNOSIS — I10 Essential (primary) hypertension: Secondary | ICD-10-CM | POA: Diagnosis not present

## 2016-01-31 DIAGNOSIS — M21371 Foot drop, right foot: Secondary | ICD-10-CM | POA: Diagnosis not present

## 2016-01-31 DIAGNOSIS — E7800 Pure hypercholesterolemia, unspecified: Secondary | ICD-10-CM | POA: Diagnosis not present

## 2016-01-31 DIAGNOSIS — N183 Chronic kidney disease, stage 3 (moderate): Secondary | ICD-10-CM | POA: Diagnosis not present

## 2016-01-31 DIAGNOSIS — E1142 Type 2 diabetes mellitus with diabetic polyneuropathy: Secondary | ICD-10-CM | POA: Diagnosis not present

## 2016-02-07 ENCOUNTER — Telehealth: Payer: Self-pay | Admitting: Cardiology

## 2016-02-07 ENCOUNTER — Ambulatory Visit (INDEPENDENT_AMBULATORY_CARE_PROVIDER_SITE_OTHER): Payer: Medicare Other | Admitting: *Deleted

## 2016-02-07 DIAGNOSIS — I495 Sick sinus syndrome: Secondary | ICD-10-CM | POA: Diagnosis not present

## 2016-02-07 NOTE — Telephone Encounter (Signed)
Spoke with pt and reminded pt of remote transmission that is due today. Pt verbalized understanding.   

## 2016-02-07 NOTE — Progress Notes (Signed)
Remote pacemaker transmission.   

## 2016-02-08 LAB — CUP PACEART REMOTE DEVICE CHECK
Battery Impedance: 132 Ohm
Battery Voltage: 2.78 V
Brady Statistic AP VP Percent: 0 %
Brady Statistic AP VS Percent: 88 %
Brady Statistic AS VP Percent: 0 %
Brady Statistic AS VS Percent: 12 %
Implantable Lead Implant Date: 20000419
Implantable Lead Implant Date: 20000419
Implantable Lead Location: 753859
Implantable Lead Location: 753860
Implantable Lead Model: 4023
Lead Channel Impedance Value: 368 Ohm
Lead Channel Impedance Value: 436 Ohm
Lead Channel Pacing Threshold Amplitude: 0.625 V
Lead Channel Pacing Threshold Amplitude: 0.875 V
Lead Channel Pacing Threshold Pulse Width: 0.4 ms
Lead Channel Pacing Threshold Pulse Width: 0.4 ms
Lead Channel Sensing Intrinsic Amplitude: 8 mV
Lead Channel Setting Pacing Amplitude: 2 V
Lead Channel Setting Pacing Amplitude: 2.5 V
MDC IDC MSMT BATTERY REMAINING LONGEVITY: 132 mo
MDC IDC SESS DTM: 20170710144628
MDC IDC SET LEADCHNL RV PACING PULSEWIDTH: 0.4 ms
MDC IDC SET LEADCHNL RV SENSING SENSITIVITY: 2.8 mV

## 2016-02-09 ENCOUNTER — Encounter: Payer: Self-pay | Admitting: Cardiology

## 2016-02-24 NOTE — Addendum Note (Signed)
Addended by: Thresa Ross C on: 02/24/2016 11:07 AM   Modules accepted: Orders

## 2016-03-09 DIAGNOSIS — M4716 Other spondylosis with myelopathy, lumbar region: Secondary | ICD-10-CM | POA: Diagnosis not present

## 2016-03-09 DIAGNOSIS — S32009K Unspecified fracture of unspecified lumbar vertebra, subsequent encounter for fracture with nonunion: Secondary | ICD-10-CM | POA: Diagnosis not present

## 2016-03-09 DIAGNOSIS — M4306 Spondylolysis, lumbar region: Secondary | ICD-10-CM | POA: Diagnosis not present

## 2016-03-09 DIAGNOSIS — Q762 Congenital spondylolisthesis: Secondary | ICD-10-CM | POA: Diagnosis not present

## 2016-03-31 NOTE — Addendum Note (Signed)
Addended by: Reola Calkins on: 03/31/2016 03:21 PM   Modules accepted: Orders

## 2016-05-05 DIAGNOSIS — E1365 Other specified diabetes mellitus with hyperglycemia: Secondary | ICD-10-CM | POA: Diagnosis not present

## 2016-05-05 DIAGNOSIS — E7800 Pure hypercholesterolemia, unspecified: Secondary | ICD-10-CM | POA: Diagnosis not present

## 2016-05-05 DIAGNOSIS — E1322 Other specified diabetes mellitus with diabetic chronic kidney disease: Secondary | ICD-10-CM | POA: Diagnosis not present

## 2016-05-05 DIAGNOSIS — N183 Chronic kidney disease, stage 3 (moderate): Secondary | ICD-10-CM | POA: Diagnosis not present

## 2016-05-05 DIAGNOSIS — Z6841 Body Mass Index (BMI) 40.0 and over, adult: Secondary | ICD-10-CM | POA: Diagnosis not present

## 2016-05-07 DIAGNOSIS — Z23 Encounter for immunization: Secondary | ICD-10-CM | POA: Diagnosis not present

## 2016-05-08 ENCOUNTER — Ambulatory Visit (INDEPENDENT_AMBULATORY_CARE_PROVIDER_SITE_OTHER): Payer: Medicare Other | Admitting: *Deleted

## 2016-05-08 DIAGNOSIS — I495 Sick sinus syndrome: Secondary | ICD-10-CM | POA: Diagnosis not present

## 2016-05-08 NOTE — Progress Notes (Signed)
Remote pacemaker transmission.   

## 2016-05-09 ENCOUNTER — Encounter: Payer: Self-pay | Admitting: Cardiology

## 2016-05-10 LAB — CUP PACEART REMOTE DEVICE CHECK
Battery Impedance: 132 Ohm
Brady Statistic AP VS Percent: 86 %
Brady Statistic AS VP Percent: 0 %
Date Time Interrogation Session: 20171009112536
Implantable Lead Implant Date: 20000419
Implantable Lead Location: 753859
Implantable Lead Model: 4023
Lead Channel Impedance Value: 445 Ohm
Lead Channel Pacing Threshold Amplitude: 0.5 V
Lead Channel Pacing Threshold Amplitude: 0.75 V
Lead Channel Pacing Threshold Pulse Width: 0.4 ms
Lead Channel Pacing Threshold Pulse Width: 0.4 ms
Lead Channel Setting Pacing Pulse Width: 0.4 ms
Lead Channel Setting Sensing Sensitivity: 2.8 mV
MDC IDC LEAD IMPLANT DT: 20000419
MDC IDC LEAD LOCATION: 753860
MDC IDC MSMT BATTERY REMAINING LONGEVITY: 132 mo
MDC IDC MSMT BATTERY VOLTAGE: 2.78 V
MDC IDC MSMT LEADCHNL RA IMPEDANCE VALUE: 364 Ohm
MDC IDC SET LEADCHNL RA PACING AMPLITUDE: 2 V
MDC IDC SET LEADCHNL RV PACING AMPLITUDE: 2.5 V
MDC IDC STAT BRADY AP VP PERCENT: 0 %
MDC IDC STAT BRADY AS VS PERCENT: 14 %

## 2016-06-13 DIAGNOSIS — M4306 Spondylolysis, lumbar region: Secondary | ICD-10-CM | POA: Diagnosis not present

## 2016-07-19 DIAGNOSIS — E7800 Pure hypercholesterolemia, unspecified: Secondary | ICD-10-CM | POA: Diagnosis not present

## 2016-07-19 DIAGNOSIS — Z6841 Body Mass Index (BMI) 40.0 and over, adult: Secondary | ICD-10-CM | POA: Diagnosis not present

## 2016-07-19 DIAGNOSIS — Z7189 Other specified counseling: Secondary | ICD-10-CM | POA: Diagnosis not present

## 2016-07-19 DIAGNOSIS — Z0000 Encounter for general adult medical examination without abnormal findings: Secondary | ICD-10-CM | POA: Diagnosis not present

## 2016-07-19 DIAGNOSIS — Z299 Encounter for prophylactic measures, unspecified: Secondary | ICD-10-CM | POA: Diagnosis not present

## 2016-07-19 DIAGNOSIS — R5383 Other fatigue: Secondary | ICD-10-CM | POA: Diagnosis not present

## 2016-07-19 DIAGNOSIS — Z1389 Encounter for screening for other disorder: Secondary | ICD-10-CM | POA: Diagnosis not present

## 2016-07-19 DIAGNOSIS — Z1211 Encounter for screening for malignant neoplasm of colon: Secondary | ICD-10-CM | POA: Diagnosis not present

## 2016-07-19 DIAGNOSIS — Z79899 Other long term (current) drug therapy: Secondary | ICD-10-CM | POA: Diagnosis not present

## 2016-07-19 DIAGNOSIS — Z125 Encounter for screening for malignant neoplasm of prostate: Secondary | ICD-10-CM | POA: Diagnosis not present

## 2016-08-04 ENCOUNTER — Encounter: Payer: Self-pay | Admitting: Internal Medicine

## 2016-08-04 ENCOUNTER — Ambulatory Visit (INDEPENDENT_AMBULATORY_CARE_PROVIDER_SITE_OTHER): Payer: Medicare Other | Admitting: Internal Medicine

## 2016-08-04 VITALS — BP 136/81 | HR 85 | Ht 67.0 in | Wt 241.0 lb

## 2016-08-04 DIAGNOSIS — I10 Essential (primary) hypertension: Secondary | ICD-10-CM

## 2016-08-04 DIAGNOSIS — I4430 Unspecified atrioventricular block: Secondary | ICD-10-CM

## 2016-08-04 DIAGNOSIS — I495 Sick sinus syndrome: Secondary | ICD-10-CM

## 2016-08-04 LAB — CUP PACEART INCLINIC DEVICE CHECK
Battery Impedance: 132 Ohm
Battery Remaining Longevity: 132 mo
Battery Voltage: 2.78 V
Brady Statistic AP VP Percent: 0 %
Brady Statistic AP VS Percent: 83 %
Brady Statistic AS VP Percent: 0 %
Brady Statistic AS VS Percent: 17 %
Date Time Interrogation Session: 20180105151249
Implantable Lead Location: 753859
Implantable Lead Location: 753860
Implantable Lead Model: 4023
Implantable Pulse Generator Implant Date: 20150831
Lead Channel Impedance Value: 355 Ohm
Lead Channel Impedance Value: 445 Ohm
Lead Channel Pacing Threshold Amplitude: 0.5 V
Lead Channel Pacing Threshold Amplitude: 0.5 V
Lead Channel Pacing Threshold Amplitude: 0.75 V
Lead Channel Pacing Threshold Pulse Width: 0.4 ms
Lead Channel Pacing Threshold Pulse Width: 0.4 ms
Lead Channel Sensing Intrinsic Amplitude: 2.8 mV
Lead Channel Sensing Intrinsic Amplitude: 8 mV
Lead Channel Setting Pacing Amplitude: 2 V
Lead Channel Setting Pacing Pulse Width: 0.4 ms
Lead Channel Setting Sensing Sensitivity: 4 mV
MDC IDC LEAD IMPLANT DT: 20000419
MDC IDC LEAD IMPLANT DT: 20000419
MDC IDC MSMT LEADCHNL RA PACING THRESHOLD AMPLITUDE: 0.75 V
MDC IDC MSMT LEADCHNL RA PACING THRESHOLD PULSEWIDTH: 0.4 ms
MDC IDC MSMT LEADCHNL RV PACING THRESHOLD PULSEWIDTH: 0.4 ms
MDC IDC SET LEADCHNL RV PACING AMPLITUDE: 2.5 V

## 2016-08-04 NOTE — Patient Instructions (Addendum)
Medication Instructions:  Continue all current medications.  Labwork: none  Testing/Procedures: none  Follow-Up: Your physician wants you to follow up in:  1 year.  You will receive a reminder letter in the mail one-two months in advance.  If you don't receive a letter, please call our office to schedule the follow up appointment   Any Other Special Instructions Will Be Listed Below (If Applicable). Remote monitoring is used to monitor your Pacemaker of ICD from home. This monitoring reduces the number of office visits required to check your device to one time per year. It allows [place] to keep an eye on the functioning of your device to ensure it is working properly. You are scheduled for a device check from home on 11/06/2016.  You may send your transmission at any time that day. If you have a wireless device, the transmission will be sent automatically. After your physician reviews your transmission, you will receive a postcard with your next transmission date.  If you need a refill on your cardiac medications before your next appointment, please call your pharmacy.

## 2016-08-04 NOTE — Progress Notes (Signed)
PCP: Monico Blitz, MD  The patient presents today for cardiology/ electrophysiology followup.  Since his last visit he reports doing very well.  He has occasional SOB.  He attributes this to being overweight and not very active due to chronic back pain.  Today, he denies symptoms of palpitations, chest pain, orthopnea, PND, lower extremity edema, dizziness, presyncope, syncope, or neurologic sequela.  The patient feels that he is tolerating medications without difficulties and is otherwise without complaint today.   Past Medical History:  Diagnosis Date  . AAA (abdominal aortic aneurysm) ([place])   . COPD (chronic obstructive pulmonary disease) ([place])   . Coronary artery disease 2000   Status post CABG  . Diabetes mellitus    DM2  . Dyslipidemia   . Hypertension   . Obesity   . Osteoarthritis   . Sleep apnea    USING CPAP  . Spinal stenosis   . SSS (sick sinus syndrome) ([place]) 2000   s/p PPM   Past Surgical History:  Procedure Laterality Date  . CORONARY ARTERY BYPASS GRAFT    . L5-S1 Gill decompressive laminectomy    . PACEMAKER INSERTION     MDT implanted for sick sinus syndrome  . PERMANENT PACEMAKER GENERATOR CHANGE N/A 03/30/2014   MDT Adapta L generator change by Dr Rayann Heman  . Right total hip arthroplasty.      Current Outpatient Prescriptions  Medication Sig Dispense Refill  . aspirin EC 81 MG tablet Take 81 mg by mouth daily.    [name] atorvastatin (LIPITOR) 40 MG tablet Take 40 mg by mouth daily.    . Coenzyme Q10 (COQ-10) 200 MG CAPS Take 1 capsule by mouth daily.    . cyclobenzaprine (FLEXERIL) 5 MG tablet Take 1 tablet (5 mg total) by mouth at bedtime as needed for muscle spasms. 30 tablet 5  . diclofenac (VOLTAREN) 75 MG EC tablet Take 75 mg by mouth 2 (two) times daily.    [name] glimepiride (AMARYL) 4 MG tablet Take 2 mg by mouth daily.     [name] losartan (COZAAR) 100 MG tablet Take 100 mg by mouth daily.    . metoprolol succinate (TOPROL-XL) 25 MG 24 hr tablet Take 25 mg by  mouth every morning. & 12.5 mg in the evening    . Multiple Vitamin (MULTIVITAMIN) tablet Take 1 tablet by mouth daily.     . nitroGLYCERIN (NITROSTAT) 0.4 MG SL tablet Place 0.4 mg under the tongue every 5 (five) minutes as needed for chest pain. Reported on 01/11/2016    . Omega-3 Fatty Acids (FISH OIL) 1200 MG CAPS Take 1,200 mg by mouth 2 (two) times daily.    . pioglitazone (ACTOS) 15 MG tablet Take 15 mg by mouth daily.     . Tetrahydrozoline HCl (EYE DROPS OP) Apply 1-2 drops to eye daily as needed (for dry eyes).     No current facility-administered medications for this visit.     No Known Allergies  Social History   Social History  . Marital status: Married    Spouse name: N/A  . Number of children: N/A  . Years of education: N/A   Occupational History  . 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 Smoking status: Former Smoker    Packs/day: 1.00    Years: 42.00    Types: Cigarettes    Start date: 01/28/1961    Quit date: 10/30/1998  . Smokeless tobacco: Never Used  . Alcohol use No  .  Drug use: No  . Sexual activity: Not on file   Other Topics Concern  . Not on file   Social History Narrative   Lives with wife in a 2 story home.  They have 2 grown children.     Retired Software engineer.     Physical Exam: Vitals:   08/04/16 1136  BP: 136/81  Pulse: 85  Weight: 241 lb (109.3 kg)  Height: 5\' 7"  (1.702 m)    GEN- The patient is overweight appearing, alert and oriented x 3 today.  Walks with a cane Head- normocephalic, atraumatic Eyes-  Sclera clear, conjunctiva pink Ears- hearing intact Oropharynx- clear Neck- supple,  Lungs- Clear to ausculation bilaterally, normal work of breathing Chest- pacemaker pocket is well healed Heart- Regular rate and rhythm, no murmurs, rubs or gallops, PMI not laterally displaced GI- soft, NT, ND, + BS Extremities- no clubbing, cyanosis,  + chronic venous stasis changes with 1+ edema  A/P 1. Symptomatic  bradycardia Normal pacemaker function RV lead is a unipolar lead.  There is some RV lead noise however this is stable and he does not V pace. See Pace Art report No changes today  2. CAD No ischemic symptoms  3. HTN Stable No change required today  4. Obesity Body mass index is 37.75 kg/m. Weight loss advised  carelink Return to see me in 1 year  Thompson Grayer MD, [facility] 08/04/2016 12:13 PM

## 2016-08-09 DIAGNOSIS — E1142 Type 2 diabetes mellitus with diabetic polyneuropathy: Secondary | ICD-10-CM | POA: Diagnosis not present

## 2016-08-09 DIAGNOSIS — Z299 Encounter for prophylactic measures, unspecified: Secondary | ICD-10-CM | POA: Diagnosis not present

## 2016-08-09 DIAGNOSIS — E1365 Other specified diabetes mellitus with hyperglycemia: Secondary | ICD-10-CM | POA: Diagnosis not present

## 2016-08-09 DIAGNOSIS — E1322 Other specified diabetes mellitus with diabetic chronic kidney disease: Secondary | ICD-10-CM | POA: Diagnosis not present

## 2016-08-09 DIAGNOSIS — Z6841 Body Mass Index (BMI) 40.0 and over, adult: Secondary | ICD-10-CM | POA: Diagnosis not present

## 2016-08-09 DIAGNOSIS — N183 Chronic kidney disease, stage 3 (moderate): Secondary | ICD-10-CM | POA: Diagnosis not present

## 2016-08-09 DIAGNOSIS — Z87891 Personal history of nicotine dependence: Secondary | ICD-10-CM | POA: Diagnosis not present

## 2016-11-06 ENCOUNTER — Telehealth: Payer: Self-pay | Admitting: *Deleted

## 2016-11-06 ENCOUNTER — Encounter: Payer: Medicare Other | Admitting: *Deleted

## 2016-11-06 ENCOUNTER — Telehealth: Payer: Self-pay | Admitting: Cardiology

## 2016-11-06 NOTE — Telephone Encounter (Signed)
Pt requesting assistance on device transmissions via cell phone  - routed to device clinic

## 2016-11-06 NOTE — Telephone Encounter (Signed)
Spoke with pt and reminded pt of remote transmission that is due today. Pt verbalized understanding.   

## 2016-11-07 ENCOUNTER — Telehealth: Payer: Self-pay | Admitting: Cardiology

## 2016-11-07 DIAGNOSIS — Z87891 Personal history of nicotine dependence: Secondary | ICD-10-CM | POA: Diagnosis not present

## 2016-11-07 DIAGNOSIS — Z6838 Body mass index (BMI) 38.0-38.9, adult: Secondary | ICD-10-CM | POA: Diagnosis not present

## 2016-11-07 DIAGNOSIS — E1142 Type 2 diabetes mellitus with diabetic polyneuropathy: Secondary | ICD-10-CM | POA: Diagnosis not present

## 2016-11-07 DIAGNOSIS — Z713 Dietary counseling and surveillance: Secondary | ICD-10-CM | POA: Diagnosis not present

## 2016-11-07 DIAGNOSIS — E1122 Type 2 diabetes mellitus with diabetic chronic kidney disease: Secondary | ICD-10-CM | POA: Diagnosis not present

## 2016-11-07 DIAGNOSIS — N183 Chronic kidney disease, stage 3 (moderate): Secondary | ICD-10-CM | POA: Diagnosis not present

## 2016-11-07 DIAGNOSIS — Z299 Encounter for prophylactic measures, unspecified: Secondary | ICD-10-CM | POA: Diagnosis not present

## 2016-11-07 DIAGNOSIS — E7800 Pure hypercholesterolemia, unspecified: Secondary | ICD-10-CM | POA: Diagnosis not present

## 2016-11-07 DIAGNOSIS — E1165 Type 2 diabetes mellitus with hyperglycemia: Secondary | ICD-10-CM | POA: Diagnosis not present

## 2016-11-07 DIAGNOSIS — K219 Gastro-esophageal reflux disease without esophagitis: Secondary | ICD-10-CM | POA: Diagnosis not present
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
## 2016-11-07 NOTE — Telephone Encounter (Signed)
Pt called to get help setting up his mycarelink smart home monitor. Pt was unable to find wifi settings and internet app. Informed pt that he could come to the office and we could help him set up his home monitor. Pt wanted to come to the [place] office. Pt agreed to an appt on 4-24-1 at 3:00 PM.

## 2016-11-10 ENCOUNTER — Ambulatory Visit (INDEPENDENT_AMBULATORY_CARE_PROVIDER_SITE_OTHER): Payer: Medicare Other | Admitting: *Deleted

## 2016-11-10 DIAGNOSIS — I495 Sick sinus syndrome: Secondary | ICD-10-CM | POA: Diagnosis not present

## 2016-11-13 NOTE — Progress Notes (Signed)
Remote pacemaker transmission.   

## 2016-11-14 LAB — CUP PACEART REMOTE DEVICE CHECK
Battery Impedance: 132 Ohm
Brady Statistic AP VP Percent: 0 %
Brady Statistic AP VS Percent: 47 %
Brady Statistic AS VS Percent: 53 %
Implantable Lead Implant Date: 20000419
Implantable Lead Location: 753859
Implantable Lead Model: 4023
Lead Channel Impedance Value: 435 Ohm
Lead Channel Pacing Threshold Amplitude: 0.625 V
Lead Channel Pacing Threshold Pulse Width: 0.4 ms
Lead Channel Pacing Threshold Pulse Width: 0.4 ms
Lead Channel Setting Pacing Amplitude: 2 V
Lead Channel Setting Pacing Amplitude: 2.5 V
MDC IDC LEAD IMPLANT DT: 20000419
MDC IDC LEAD LOCATION: 753860
MDC IDC MSMT BATTERY REMAINING LONGEVITY: 140 mo
MDC IDC MSMT BATTERY VOLTAGE: 2.78 V
MDC IDC MSMT LEADCHNL RA IMPEDANCE VALUE: 364 Ohm
MDC IDC MSMT LEADCHNL RA PACING THRESHOLD AMPLITUDE: 0.875 V
MDC IDC PG IMPLANT DT: 20150831
MDC IDC SESS DTM: 20180413132440
MDC IDC SET LEADCHNL RV PACING PULSEWIDTH: 0.4 ms
MDC IDC SET LEADCHNL RV SENSING SENSITIVITY: 2.8 mV
MDC IDC STAT BRADY AS VP PERCENT: 0 %

## 2016-11-15 ENCOUNTER — Encounter: Payer: Self-pay | Admitting: Cardiology

## 2016-12-07 DIAGNOSIS — Z471 Aftercare following joint replacement surgery: Secondary | ICD-10-CM | POA: Diagnosis not present

## 2016-12-07 DIAGNOSIS — M25551 Pain in right hip: Secondary | ICD-10-CM | POA: Diagnosis not present

## 2016-12-07 DIAGNOSIS — Z96641 Presence of right artificial hip joint: Secondary | ICD-10-CM | POA: Diagnosis not present

## 2016-12-21 DIAGNOSIS — Q762 Congenital spondylolisthesis: Secondary | ICD-10-CM | POA: Diagnosis not present

## 2016-12-21 DIAGNOSIS — M4306 Spondylolysis, lumbar region: Secondary | ICD-10-CM | POA: Diagnosis not present

## 2016-12-21 DIAGNOSIS — S32009K Unspecified fracture of unspecified lumbar vertebra, subsequent encounter for fracture with nonunion: Secondary | ICD-10-CM | POA: Diagnosis not present

## 2017-01-08 ENCOUNTER — Encounter: Payer: Self-pay | Admitting: Vascular Surgery

## 2017-01-16 ENCOUNTER — Encounter: Payer: Self-pay | Admitting: Vascular Surgery

## 2017-01-16 ENCOUNTER — Ambulatory Visit (INDEPENDENT_AMBULATORY_CARE_PROVIDER_SITE_OTHER): Payer: Medicare Other | Admitting: Vascular Surgery

## 2017-01-16 ENCOUNTER — Ambulatory Visit (HOSPITAL_COMMUNITY)
Admission: RE | Admit: 2017-01-16 | Discharge: 2017-01-16 | Disposition: A | Discharge status: Home / Self Care | Payer: Medicare Other | Source: Ambulatory Visit | Attending: Vascular Surgery | Admitting: Vascular Surgery

## 2017-01-16 ENCOUNTER — Ambulatory Visit (INDEPENDENT_AMBULATORY_CARE_PROVIDER_SITE_OTHER)
Admission: RE | Admit: 2017-01-16 | Discharge: 2017-01-16 | Disposition: A | Discharge status: Home / Self Care | Payer: Medicare Other | Source: Ambulatory Visit | Attending: Vascular Surgery | Admitting: Vascular Surgery

## 2017-01-16 VITALS — BP 206/103 | HR 89 | Temp 97.2°F | Resp 20 | Ht 67.0 in | Wt 226.0 lb

## 2017-01-16 DIAGNOSIS — I714 Abdominal aortic aneurysm, without rupture, unspecified: Secondary | ICD-10-CM

## 2017-01-16 NOTE — Progress Notes (Signed)
Vascular and Vein Specialist of [place]  Patient name: [name] MRN: [id number] DOB: 03-Jul-1941 Sex: male  REASON FOR VISIT: Follow-up small abdominal aortic aneurysms and moderate size iliac arteries aneurysm.  HPI: [name] is a 76 y.o. male here today for follow-up. His had no new medical difficulties since my last visit with him. Does have osteoarthritis with the knee replacements making his ability to get around somewhat limited. Does have stable claudication. Denies any rest pain or tissue loss.  Past Medical History:  Diagnosis Date  . AAA (abdominal aortic aneurysm) ([place])   . COPD (chronic obstructive pulmonary disease) ([place])   . Coronary artery disease 2000   Status post CABG  . Diabetes mellitus    DM2  . Dyslipidemia   . Hypertension   . Obesity   . Osteoarthritis   . Sleep apnea    USING CPAP  . Spinal stenosis   . SSS (sick sinus syndrome) ([place]) 2000   s/p PPM    Family History  Problem Relation Age of Onset  . CAD Father   . Stroke Father        Deceased, 62  . Hypertension Mother   . Stroke Mother        Deceased, 52  . Heart disease Mother   . Stroke Brother        Deceased, 83  . Hypertension Sister        Deceased, 51  . Heart disease Sister   . Healthy Son   . Healthy Daughter     SOCIAL HISTORY: Social History  Substance Use Topics  . Smoking status: Former Smoker    Packs/day: 1.00    Years: 42.00    Types: Cigarettes    Start date: 01/28/1961    Quit date: 10/30/1998  . Smokeless tobacco: Never Used  . Alcohol use No    No Known Allergies  Current Outpatient Prescriptions  Medication Sig Dispense Refill  . aspirin EC 81 MG tablet Take 81 mg by mouth daily.    [name] atorvastatin (LIPITOR) 40 MG tablet Take 40 mg by mouth daily.    . Coenzyme Q10 (COQ-10) 200 MG CAPS Take 1 capsule by mouth daily.    . diclofenac (VOLTAREN) 75 MG EC tablet Take 75 mg by mouth 2 (two) times daily.    [name]  glimepiride (AMARYL) 4 MG tablet Take 2 mg by mouth daily.     [name] losartan (COZAAR) 100 MG tablet Take 100 mg by mouth daily.    . metoprolol succinate (TOPROL-XL) 25 MG 24 hr tablet Take 25 mg by mouth every morning. & 12.5 mg in the evening    . Multiple Vitamin (MULTIVITAMIN) tablet Take 1 tablet by mouth daily.     . nitroGLYCERIN (NITROSTAT) 0.4 MG SL tablet Place 0.4 mg under the tongue every 5 (five) minutes as needed for chest pain. Reported on 01/11/2016    . Omega-3 Fatty Acids (FISH OIL) 1200 MG CAPS Take 1,200 mg by mouth 2 (two) times daily.    . pioglitazone (ACTOS) 15 MG tablet Take 15 mg by mouth daily.     . Tetrahydrozoline HCl (EYE DROPS OP) Apply 1-2 drops to eye daily as needed (for dry eyes).     No current facility-administered medications for this visit.     REVIEW OF SYSTEMS:  [X]  denotes positive finding, [ ]  denotes negative finding Cardiac  Comments:  Chest pain or chest pressure:    Shortness of breath  upon exertion:    Short of breath when lying flat:    Irregular heart rhythm:        Vascular    Pain in calf, thigh, or hip brought on by ambulation: x   Pain in feet at night that wakes you up from your sleep:     Blood clot in your veins:    Leg swelling:           PHYSICAL EXAM: Vitals:   01/16/17 0904 01/16/17 0908  BP: (!) 200/113 (!) 206/103  Pulse: 89   Resp: 20   Temp: 97.2 F (36.2 C)   TempSrc: Oral   SpO2: 92%   Weight: 226 lb (102.5 kg)   Height: 5\' 7"  (1.702 m)     GENERAL: The patient is a well-nourished male, in no acute distress. The vital signs are documented above. CARDIOVASCULAR: 2+ radial and 2+ femoral pulses bilaterally. Abdominal obesity and I am unable to palpate an aneurysm. No palpable pedal pulses. PULMONARY: There is good air exchange  MUSCULOSKELETAL: There are no major deformities or cyanosis. NEUROLOGIC: No focal weakness or paresthesias are detected. SKIN: There are no ulcers or rashes noted. PSYCHIATRIC: The  patient has a normal affect.  DATA:  Managing of his aorta iliac system reveal no change compared to a CT scan from one year ago. His maximal aortic diameter is 3.4 cm. Maximal iliac artery is 2.6 on the left and 1.4 on the right  MEDICAL ISSUES: Stable aortic and bilateral iliac artery aneurysmal change. Recommend yearly ultrasound to rule out any enlargement. Again reviewed symptoms of leaking aneurysm with patient knows report immediately to the emergency room should this occur.    Rosetta Posner, MD FACS [facility] of [facility] Tel [phone] Pager [phone]

## 2017-01-23 NOTE — Addendum Note (Signed)
Addended by: Lianne Cure A on: 01/23/2017 03:56 PM   Modules accepted: Orders

## 2017-02-12 ENCOUNTER — Ambulatory Visit (INDEPENDENT_AMBULATORY_CARE_PROVIDER_SITE_OTHER): Payer: Medicare Other | Admitting: *Deleted

## 2017-02-12 DIAGNOSIS — H25013 Cortical age-related cataract, bilateral: Secondary | ICD-10-CM | POA: Diagnosis not present

## 2017-02-12 DIAGNOSIS — H35033 Hypertensive retinopathy, bilateral: Secondary | ICD-10-CM | POA: Diagnosis not present

## 2017-02-12 DIAGNOSIS — H2511 Age-related nuclear cataract, right eye: Secondary | ICD-10-CM | POA: Diagnosis not present

## 2017-02-12 DIAGNOSIS — I495 Sick sinus syndrome: Secondary | ICD-10-CM

## 2017-02-12 DIAGNOSIS — E113391 Type 2 diabetes mellitus with moderate nonproliferative diabetic retinopathy without macular edema, right eye: Secondary | ICD-10-CM | POA: Diagnosis not present

## 2017-02-12 DIAGNOSIS — H2513 Age-related nuclear cataract, bilateral: Secondary | ICD-10-CM | POA: Diagnosis not present

## 2017-02-12 DIAGNOSIS — E113292 Type 2 diabetes mellitus with mild nonproliferative diabetic retinopathy without macular edema, left eye: Secondary | ICD-10-CM | POA: Diagnosis not present

## 2017-02-12 DIAGNOSIS — H353132 Nonexudative age-related macular degeneration, bilateral, intermediate dry stage: Secondary | ICD-10-CM | POA: Diagnosis not present

## 2017-02-12 NOTE — Progress Notes (Signed)
Remote pacemaker transmission.   

## 2017-02-13 DIAGNOSIS — Z950 Presence of cardiac pacemaker: Secondary | ICD-10-CM | POA: Diagnosis not present

## 2017-02-13 DIAGNOSIS — N183 Chronic kidney disease, stage 3 (moderate): Secondary | ICD-10-CM | POA: Diagnosis not present

## 2017-02-13 DIAGNOSIS — Z6839 Body mass index (BMI) 39.0-39.9, adult: Secondary | ICD-10-CM | POA: Diagnosis not present

## 2017-02-13 DIAGNOSIS — E1165 Type 2 diabetes mellitus with hyperglycemia: Secondary | ICD-10-CM | POA: Diagnosis not present

## 2017-02-13 DIAGNOSIS — I25810 Atherosclerosis of coronary artery bypass graft(s) without angina pectoris: Secondary | ICD-10-CM | POA: Diagnosis not present

## 2017-02-13 DIAGNOSIS — I739 Peripheral vascular disease, unspecified: Secondary | ICD-10-CM | POA: Diagnosis not present

## 2017-02-13 DIAGNOSIS — E7800 Pure hypercholesterolemia, unspecified: Secondary | ICD-10-CM | POA: Diagnosis not present

## 2017-02-13 DIAGNOSIS — I714 Abdominal aortic aneurysm, without rupture: Secondary | ICD-10-CM | POA: Diagnosis not present

## 2017-02-13 DIAGNOSIS — E1142 Type 2 diabetes mellitus with diabetic polyneuropathy: Secondary | ICD-10-CM | POA: Diagnosis not present

## 2017-02-13 DIAGNOSIS — E1122 Type 2 diabetes mellitus with diabetic chronic kidney disease: Secondary | ICD-10-CM | POA: Diagnosis not present

## 2017-02-13 DIAGNOSIS — Z299 Encounter for prophylactic measures, unspecified: Secondary | ICD-10-CM | POA: Diagnosis not present

## 2017-02-13 DIAGNOSIS — I10 Essential (primary) hypertension: Secondary | ICD-10-CM | POA: Diagnosis not present

## 2017-02-13 LAB — CUP PACEART REMOTE DEVICE CHECK
Battery Remaining Longevity: 132 mo
Battery Voltage: 2.78 V
Brady Statistic AP VP Percent: 0 %
Brady Statistic AS VS Percent: 43 %
Date Time Interrogation Session: 20180716105408
Implantable Lead Implant Date: 20000419
Implantable Lead Location: 753859
Implantable Lead Model: 4023
Implantable Pulse Generator Implant Date: 20150831
Lead Channel Impedance Value: 427 Ohm
Lead Channel Pacing Threshold Amplitude: 0.5 V
Lead Channel Pacing Threshold Pulse Width: 0.4 ms
Lead Channel Setting Pacing Amplitude: 2 V
Lead Channel Setting Sensing Sensitivity: 2.8 mV
MDC IDC LEAD IMPLANT DT: 20000419
MDC IDC LEAD LOCATION: 753860
MDC IDC MSMT BATTERY IMPEDANCE: 156 Ohm
MDC IDC MSMT LEADCHNL RA IMPEDANCE VALUE: 355 Ohm
MDC IDC MSMT LEADCHNL RA PACING THRESHOLD AMPLITUDE: 0.875 V
MDC IDC MSMT LEADCHNL RA PACING THRESHOLD PULSEWIDTH: 0.4 ms
MDC IDC SET LEADCHNL RV PACING AMPLITUDE: 2.5 V
MDC IDC SET LEADCHNL RV PACING PULSEWIDTH: 0.4 ms
MDC IDC STAT BRADY AP VS PERCENT: 57 %
MDC IDC STAT BRADY AS VP PERCENT: 0 %

## 2017-02-14 ENCOUNTER — Encounter: Payer: Self-pay | Admitting: Cardiology

## 2017-05-08 DIAGNOSIS — M1612 Unilateral primary osteoarthritis, left hip: Secondary | ICD-10-CM | POA: Diagnosis not present

## 2017-05-14 ENCOUNTER — Ambulatory Visit (INDEPENDENT_AMBULATORY_CARE_PROVIDER_SITE_OTHER): Payer: Medicare Other | Admitting: *Deleted

## 2017-05-14 ENCOUNTER — Telehealth: Payer: Self-pay | Admitting: Cardiology

## 2017-05-14 DIAGNOSIS — I495 Sick sinus syndrome: Secondary | ICD-10-CM

## 2017-05-14 NOTE — Telephone Encounter (Signed)
LMOVM reminding pt to send remote transmission.   

## 2017-05-15 NOTE — Progress Notes (Signed)
Remote pacemaker transmission.   

## 2017-05-16 LAB — CUP PACEART REMOTE DEVICE CHECK
Battery Remaining Longevity: 131 mo
Battery Voltage: 2.78 V
Brady Statistic AP VS Percent: 62 %
Brady Statistic AS VP Percent: 0 %
Brady Statistic AS VS Percent: 38 %
Implantable Lead Implant Date: 20000419
Implantable Lead Location: 753860
Implantable Pulse Generator Implant Date: 20150831
Lead Channel Pacing Threshold Amplitude: 0.625 V
Lead Channel Pacing Threshold Amplitude: 0.875 V
Lead Channel Pacing Threshold Pulse Width: 0.4 ms
Lead Channel Pacing Threshold Pulse Width: 0.4 ms
Lead Channel Setting Pacing Amplitude: 2 V
Lead Channel Setting Pacing Amplitude: 2.5 V
Lead Channel Setting Pacing Pulse Width: 0.4 ms
MDC IDC LEAD IMPLANT DT: 20000419
MDC IDC LEAD LOCATION: 753859
MDC IDC MSMT BATTERY IMPEDANCE: 156 Ohm
MDC IDC MSMT LEADCHNL RA IMPEDANCE VALUE: 359 Ohm
MDC IDC MSMT LEADCHNL RV IMPEDANCE VALUE: 454 Ohm
MDC IDC SESS DTM: 20181015211751
MDC IDC SET LEADCHNL RV SENSING SENSITIVITY: 2.8 mV
MDC IDC STAT BRADY AP VP PERCENT: 0 %

## 2017-05-18 ENCOUNTER — Encounter: Payer: Self-pay | Admitting: Cardiology

## 2017-05-22 DIAGNOSIS — I10 Essential (primary) hypertension: Secondary | ICD-10-CM | POA: Diagnosis not present

## 2017-05-22 DIAGNOSIS — N183 Chronic kidney disease, stage 3 (moderate): Secondary | ICD-10-CM | POA: Diagnosis not present

## 2017-05-22 DIAGNOSIS — Z23 Encounter for immunization: Secondary | ICD-10-CM | POA: Diagnosis not present

## 2017-05-22 DIAGNOSIS — E1165 Type 2 diabetes mellitus with hyperglycemia: Secondary | ICD-10-CM | POA: Diagnosis not present

## 2017-05-22 DIAGNOSIS — I714 Abdominal aortic aneurysm, without rupture: Secondary | ICD-10-CM | POA: Diagnosis not present

## 2017-05-22 DIAGNOSIS — Z6838 Body mass index (BMI) 38.0-38.9, adult: Secondary | ICD-10-CM | POA: Diagnosis not present

## 2017-05-22 DIAGNOSIS — E1122 Type 2 diabetes mellitus with diabetic chronic kidney disease: Secondary | ICD-10-CM | POA: Diagnosis not present

## 2017-05-22 DIAGNOSIS — I739 Peripheral vascular disease, unspecified: Secondary | ICD-10-CM | POA: Diagnosis not present

## 2017-05-22 DIAGNOSIS — E1142 Type 2 diabetes mellitus with diabetic polyneuropathy: Secondary | ICD-10-CM | POA: Diagnosis not present

## 2017-05-22 DIAGNOSIS — M1612 Unilateral primary osteoarthritis, left hip: Secondary | ICD-10-CM | POA: Diagnosis not present

## 2017-05-22 DIAGNOSIS — M25559 Pain in unspecified hip: Secondary | ICD-10-CM | POA: Diagnosis not present

## 2017-05-22 DIAGNOSIS — Z299 Encounter for prophylactic measures, unspecified: Secondary | ICD-10-CM | POA: Diagnosis not present

## 2017-05-28 ENCOUNTER — Encounter: Payer: Self-pay | Admitting: Internal Medicine

## 2017-05-28 ENCOUNTER — Other Ambulatory Visit: Payer: Self-pay

## 2017-05-28 ENCOUNTER — Telehealth: Payer: Self-pay | Admitting: Internal Medicine

## 2017-05-28 ENCOUNTER — Ambulatory Visit (INDEPENDENT_AMBULATORY_CARE_PROVIDER_SITE_OTHER): Payer: Medicare Other | Admitting: Internal Medicine

## 2017-05-28 VITALS — BP 118/64 | HR 72 | Ht 67.0 in | Wt 223.0 lb

## 2017-05-28 DIAGNOSIS — R0602 Shortness of breath: Secondary | ICD-10-CM | POA: Diagnosis not present

## 2017-05-28 DIAGNOSIS — Z01818 Encounter for other preprocedural examination: Secondary | ICD-10-CM | POA: Diagnosis not present

## 2017-05-28 DIAGNOSIS — I495 Sick sinus syndrome: Secondary | ICD-10-CM

## 2017-05-28 DIAGNOSIS — I10 Essential (primary) hypertension: Secondary | ICD-10-CM

## 2017-05-28 NOTE — Telephone Encounter (Signed)
Patient seen by Dr. Thompson Grayer today. Stress test has been ordered. Surgical clearance will be addressed by Dr. Rayann Heman once stress test results are known. I will remove from preop pool and send to Dr. Rayann Heman. Richardson Dopp, PA-C    05/28/2017 5:29 PM

## 2017-05-28 NOTE — Patient Instructions (Addendum)
Medication Instructions:  Your physician recommends that you continue on your current medications as directed. Please refer to the Current Medication list given to you today.    Labwork: None ordered   Testing/Procedures: Your physician has requested that you have a lexiscan myoview. For further information please visit [email or web address]. Please follow instruction sheet, as given.---at [place]: Your physician wants you to follow-up in: 12 month with Dr Rayann Heman in [place]   Any Other Special Instructions Will Be Listed Below (If Applicable).      If you need a refill on your cardiac medications before your next appointment, please call your pharmacy.

## 2017-05-28 NOTE — Progress Notes (Signed)
PCP: Monico Blitz, MD  Primary EP:  Dr Rayann Heman  [name] is a 76 y.o. male who presents today for routine electrophysiology followup.  Since last being seen in our clinic, the patient reports doing reasonably well.  He has SOB with moderate activity.  He is not very active due to hip pain.  He is planning to have hip surgery in December.  Today, he denies symptoms of palpitations, chest pain, lower extremity edema, dizziness, presyncope, or syncope.  The patient is otherwise without complaint today.   Past Medical History:  Diagnosis Date  . AAA (abdominal aortic aneurysm) ([place])   . COPD (chronic obstructive pulmonary disease) ([place])   . Coronary artery disease 2000   Status post CABG  . Diabetes mellitus    DM2  . Dyslipidemia   . Hypertension   . Obesity   . Osteoarthritis   . Sleep apnea    USING CPAP  . Spinal stenosis   . SSS (sick sinus syndrome) ([place]) 2000   s/p PPM   Past Surgical History:  Procedure Laterality Date  . CORONARY ARTERY BYPASS GRAFT    . L5-S1 Gill decompressive laminectomy    . PACEMAKER INSERTION     MDT implanted for sick sinus syndrome  . PERMANENT PACEMAKER GENERATOR CHANGE N/A 03/30/2014   MDT Adapta L generator change by Dr Rayann Heman  . Right total hip arthroplasty.      ROS- all systems are reviewed and negative except as per HPI above  Current Outpatient Prescriptions  Medication Sig Dispense Refill  . aspirin EC 81 MG tablet Take 81 mg by mouth daily.    [name] atorvastatin (LIPITOR) 40 MG tablet Take 40 mg by mouth daily.    . Coenzyme Q10 (COQ-10) 200 MG CAPS Take 1 capsule by mouth daily.    . cyclobenzaprine (FLEXERIL) 10 MG tablet Take 10 mg by mouth at bedtime.  1  . diclofenac (VOLTAREN) 75 MG EC tablet Take 75 mg by mouth 2 (two) times daily.    [name] glimepiride (AMARYL) 4 MG tablet Take 2 mg by mouth daily.     [name] losartan (COZAAR) 100 MG tablet Take 100 mg by mouth daily.    . metoprolol succinate (TOPROL-XL) 50 MG 24 hr tablet  Take 50 mg by mouth every morning.  1  . Multiple Vitamin (MULTIVITAMIN) tablet Take 1 tablet by mouth daily.     . nitroGLYCERIN (NITROSTAT) 0.4 MG SL tablet Place 0.4 mg under the tongue every 5 (five) minutes as needed for chest pain. Reported on 01/11/2016    . Omega-3 Fatty Acids (FISH OIL) 1200 MG CAPS Take 1,200 mg by mouth 2 (two) times daily.    . pioglitazone (ACTOS) 15 MG tablet Take 15 mg by mouth daily.     . Tetrahydrozoline HCl (EYE DROPS OP) Apply 1-2 drops to eye daily as needed (for dry eyes).     No current facility-administered medications for this visit.     Physical Exam: Vitals:   05/28/17 1621  BP: 118/64  Pulse: 72  SpO2: 97%  Weight: 223 lb (101.2 kg)  Height: 5\' 7"  (1.702 m)    GEN- The patient is well appearing, alert and oriented x 3 today.   Head- normocephalic, atraumatic Eyes-  Sclera clear, conjunctiva pink Ears- hearing intact Oropharynx- clear Lungs- Clear to ausculation bilaterally, normal work of breathing Chest- pacemaker pocket is well healed Heart- Regular rate and rhythm, no murmurs, rubs or gallops, PMI not laterally  displaced GI- soft, NT, ND, + BS Extremities- no clubbing, cyanosis, or edema  Pacemaker interrogation- reviewed in detail today,  See PACEART report  ekg tracing ordered today is personally reviewed and shows sinus rhythm 72 bpm, nonspecific ST/T changes  Assessment and Plan:  1. Symptomatic sinus bradycardia  Normal pacemaker function See Pace Art report No changes today RV lead is a unipolar lead.  There is some RV lead noise however this is stable and he does not V pace.  2. HTN Stable No change required today  3. CAD S/p CABG 18 years ago Given symptoms of SOB and reduced exercise tolerance, will obtain myoview to exclude ischemia prior to hip surgery.  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  4. Obesity Body mass index is 34.93 kg/m. Weight loss is advised He has lost 25 lbs in the past year!  5.  preop Planned for hip surgery in December Given symptoms of SOB and reduced exercise tolerance, will obtain myoview to exclude ischemia prior to hip surgery.  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  If low risk, ok to proceed with surgery without further CV risk stratification  6. AAA Followed by vascular  [facility] Return to see me in a year in Cain Saupe MD, [facility] 05/28/2017 4:34 PM

## 2017-05-28 NOTE — Telephone Encounter (Signed)
° °  [facility] Pre-operative Risk Assessment    Request for surgical clearance:  1. What type of surgery is being performed? LEFT HIP W/WO AUTOGRAFT/ALLOGRAFT    2. When is this surgery scheduled? 07/18/17  3. Are there any medications that need to be held prior to surgery and how long? PLEASE ADVISE IF THERE IS ANY MEDICATION THAT NEEDS TO BE HELD AND HOW LONG.   4. Practice name and name of physician performing surgery? [facility], DR. [place]   5. What is your office phone and fax number? PH: [phone], FAX: [phone]  ATTN: [place]   6. Anesthesia type (None, local, MAC, general) ? NOT LISTED    [name] 05/28/2017, 3:55 PM  _________________________________________________________________   (provider comments below)

## 2017-05-29 LAB — CUP PACEART INCLINIC DEVICE CHECK
Battery Impedance: 156 Ohm
Battery Remaining Longevity: 131 mo
Battery Voltage: 2.78 V
Brady Statistic AP VP Percent: 0 %
Brady Statistic AS VP Percent: 0 %
Implantable Lead Implant Date: 20000419
Implantable Lead Location: 753859
Implantable Lead Model: 4023
Implantable Pulse Generator Implant Date: 20150831
Lead Channel Impedance Value: 359 Ohm
Lead Channel Impedance Value: 449 Ohm
Lead Channel Pacing Threshold Amplitude: 0.625 V
Lead Channel Pacing Threshold Pulse Width: 0.4 ms
Lead Channel Sensing Intrinsic Amplitude: 2 mV
Lead Channel Sensing Intrinsic Amplitude: 8 mV
Lead Channel Setting Pacing Amplitude: 2 V
Lead Channel Setting Pacing Amplitude: 2.5 V
Lead Channel Setting Sensing Sensitivity: 2.8 mV
MDC IDC LEAD IMPLANT DT: 20000419
MDC IDC LEAD LOCATION: 753860
MDC IDC MSMT LEADCHNL RA PACING THRESHOLD AMPLITUDE: 1 V
MDC IDC MSMT LEADCHNL RA PACING THRESHOLD AMPLITUDE: 1 V
MDC IDC MSMT LEADCHNL RA PACING THRESHOLD PULSEWIDTH: 0.4 ms
MDC IDC MSMT LEADCHNL RV PACING THRESHOLD AMPLITUDE: 0.5 V
MDC IDC MSMT LEADCHNL RV PACING THRESHOLD PULSEWIDTH: 0.4 ms
MDC IDC MSMT LEADCHNL RV PACING THRESHOLD PULSEWIDTH: 0.4 ms
MDC IDC SESS DTM: 20181029193833
MDC IDC SET LEADCHNL RV PACING PULSEWIDTH: 0.4 ms
MDC IDC STAT BRADY AP VS PERCENT: 61 %
MDC IDC STAT BRADY AS VS PERCENT: 39 %

## 2017-06-06 ENCOUNTER — Other Ambulatory Visit: Payer: Self-pay | Admitting: *Deleted

## 2017-06-06 DIAGNOSIS — Z01818 Encounter for other preprocedural examination: Secondary | ICD-10-CM

## 2017-06-06 DIAGNOSIS — R0602 Shortness of breath: Secondary | ICD-10-CM

## 2017-06-07 ENCOUNTER — Encounter (HOSPITAL_BASED_OUTPATIENT_CLINIC_OR_DEPARTMENT_OTHER)
Admission: RE | Admit: 2017-06-07 | Discharge: 2017-06-07 | Disposition: A | Discharge status: Home / Self Care | Payer: Medicare Other | Source: Ambulatory Visit | Attending: Internal Medicine | Admitting: Internal Medicine

## 2017-06-07 ENCOUNTER — Encounter (HOSPITAL_COMMUNITY)
Admission: RE | Admit: 2017-06-07 | Discharge: 2017-06-07 | Disposition: A | Discharge status: Home / Self Care | Payer: Medicare Other | Source: Ambulatory Visit | Attending: Internal Medicine | Admitting: Internal Medicine

## 2017-06-07 ENCOUNTER — Encounter (HOSPITAL_COMMUNITY): Payer: Self-pay

## 2017-06-07 DIAGNOSIS — R0602 Shortness of breath: Secondary | ICD-10-CM

## 2017-06-07 DIAGNOSIS — Z01818 Encounter for other preprocedural examination: Secondary | ICD-10-CM | POA: Diagnosis not present

## 2017-06-07 LAB — NM MYOCAR MULTI W/SPECT W/WALL MOTION / EF
CHL CUP NUCLEAR SDS: 1
LHR: 0.41
LVDIAVOL: 109 mL (ref 62–150)
LVSYSVOL: 52 mL
NUC STRESS TID: 0.91
Peak HR: 75 {beats}/min
Rest HR: 67 {beats}/min
SRS: 2
SSS: 3

## 2017-06-07 MED ORDER — SODIUM CHLORIDE 0.9% FLUSH
INTRAVENOUS | Status: AC
  Administered 2017-06-07: 10 mL via INTRAVENOUS
  Filled 2017-06-07: qty 10

## 2017-06-07 MED ORDER — REGADENOSON 0.4 MG/5ML IV SOLN
INTRAVENOUS | Status: AC
  Administered 2017-06-07: 0.4 mg via INTRAVENOUS
  Filled 2017-06-07: qty 5

## 2017-06-07 MED ORDER — TECHNETIUM TC 99M TETROFOSMIN IV KIT
10.0000 | PACK | Freq: Once | INTRAVENOUS | Status: AC | PRN
  Administered 2017-06-07: 8.7 via INTRAVENOUS

## 2017-06-07 MED ORDER — TECHNETIUM TC 99M TETROFOSMIN IV KIT
30.0000 | PACK | Freq: Once | INTRAVENOUS | Status: AC | PRN
  Administered 2017-06-07: 27 via INTRAVENOUS

## 2017-06-11 DIAGNOSIS — Z6838 Body mass index (BMI) 38.0-38.9, adult: Secondary | ICD-10-CM | POA: Diagnosis not present

## 2017-06-11 DIAGNOSIS — N183 Chronic kidney disease, stage 3 (moderate): Secondary | ICD-10-CM | POA: Diagnosis not present

## 2017-06-11 DIAGNOSIS — E1165 Type 2 diabetes mellitus with hyperglycemia: Secondary | ICD-10-CM | POA: Diagnosis not present

## 2017-06-11 DIAGNOSIS — I10 Essential (primary) hypertension: Secondary | ICD-10-CM | POA: Diagnosis not present

## 2017-06-11 DIAGNOSIS — K219 Gastro-esophageal reflux disease without esophagitis: Secondary | ICD-10-CM | POA: Diagnosis not present

## 2017-06-11 DIAGNOSIS — Z299 Encounter for prophylactic measures, unspecified: Secondary | ICD-10-CM | POA: Diagnosis not present

## 2017-06-11 DIAGNOSIS — I25810 Atherosclerosis of coronary artery bypass graft(s) without angina pectoris: Secondary | ICD-10-CM | POA: Diagnosis not present

## 2017-06-20 DIAGNOSIS — M4306 Spondylolysis, lumbar region: Secondary | ICD-10-CM | POA: Diagnosis not present

## 2017-06-20 DIAGNOSIS — Z6834 Body mass index (BMI) 34.0-34.9, adult: Secondary | ICD-10-CM | POA: Diagnosis not present

## 2017-07-03 ENCOUNTER — Ambulatory Visit: Payer: Self-pay | Admitting: Orthopedic Surgery

## 2017-07-03 NOTE — H&P (Signed)
[name] DOB: 1940/09/21 Married / Language: English / Race: White Male Date of Admission:  07/18/2017 CC: Left hip pain History of Present Illness  The patient is a 76 year old male who comes in for a preoperative History and Physical. The patient is scheduled for a left total hip arthroplasty (anterior) to be performed by Dr. Dione Plover. Aluisio, MD at [facility] on 07-18-2017. The patient is a 76 year old male who presented for follow up of their hip. The patient is being followed for their hip pain and back pain. Symptoms reported include: pain, pain at night, pain with weightbearing and difficulty ambulating. The patient feels that they are doing poorly and report their pain level to be mild to moderate. Current treatment includes: NSAIDs and heat, topical ointments. The following medication has been used for pain control: antiinflammatory medication (Diclofenac). His LEFT hip is hurting at all times now. It is hurting as bad as the RIGHT one did prior to the time he had that fixed. Pain is in the groin rotating down his anterior thigh. It has gotten progressively worse over the past several months. He is at a stage he is ready go ahead and get it fixed. Reviewed his recent radiographs AP pelvis lateral LEFT hip advanced end-stage arthritis of that LEFT hip with bone-on-bone change. He has end-stage arthritis of that LEFT hip of progressively worsening pain and dysfunction. We replaced the other hip years ago he did great with that. We discussed doing a LEFT hip through an anterior approach. We discussed all that in detail and he wants to proceed with surgery. They have been treated conservatively in the past for the above stated problem and despite conservative measures, they continue to have progressive pain and severe functional limitations and dysfunction. They have failed non-operative management including home exercise, medications. It is felt that they would benefit from  undergoing total joint replacement. Risks and benefits of the procedure have been discussed with the patient and they elect to proceed with surgery. There are no active contraindications to surgery such as ongoing infection or rapidly progressive neurological disease.  Problem List/Past Medical Right hip pain (M25.551)  Bursitis, Hip (726.5)  Primary osteoarthritis of left hip (M16.12)  Diabetes Mellitus, Type II  High blood pressure  Hypercholesterolemia  Kidney Stone  Osteoarthritis  Sleep Apnea  uses CPAP Coronary Artery Disease/Heart Disease  Aneurysm  Pacemaker   Allergies  No Known Drug Allergies   Family History  Diabetes Mellitus  brother Heart Disease  First Degree Relatives. mother and father Heart disease in male family member before age 47  Kidney disease  mother  Social History Tobacco use  Former smoker. former smoker; smoke(d) 1 pack(s) per day Alcohol use  never consumed alcohol Current work status  retired Children  2 Pain Contract  no Drug/Alcohol Rehab (Currently)  no Drug/Alcohol Rehab (Previously)  no Exercise  Exercises rarely; does other Illicit drug use  no Living situation  live with spouse Marital status  married Number of flights of stairs before winded  1 Tobacco / smoke exposure  no  Medication History Co-Q-10 Active. Atorvastatin Calcium (40MG  Tablet, Oral) Active. Cozaar (100MG  Tablet, Oral) Active. Aspirin Adult Low Strength (81MG  Tablet DR, Oral) Active. Diclofenac Sodium (75MG  Tablet DR, Oral two times daily) Active. Fish Oil Burp-Less (1200MG  Capsule, Oral) Active. Metoprolol Tartrate (25MG  Tablet, Oral) Active. Amaryl (4MG  Tablet, Oral) Active. Multiple Vitamins/Minerals (Oral) Active. Actos (15MG  Tablet, Oral) Active.  Past Surgical History Spinal Fusion  lower back x3 Anal Fissure Repair  Coronary Artery Bypass Graft  Date: 2000. 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 Skin Not Present- Eczema, Hives, Itching, Lesions and Rash. HEENT Not Present- Dentures, Double Vision, Headache, Hearing Loss, Tinnitus and Visual Loss. Respiratory Present- Shortness of breath with exertion. Not Present- Allergies, Chronic Cough, Coughing up blood and Shortness of breath at rest. Cardiovascular Not Present- Chest Pain, Difficulty Breathing Lying Down, Murmur, Palpitations, Racing/skipping heartbeats and Swelling. Gastrointestinal Not Present- Abdominal Pain, Bloody Stool, Constipation, Diarrhea, Difficulty Swallowing, Heartburn, Jaundice, Loss of appetitie, Nausea and Vomiting. Male Genitourinary Present- Urinating at Night. Not Present- Blood in Urine, Discharge, Flank Pain, Incontinence, Painful Urination, Urgency, Urinary frequency, Urinary Retention and Weak urinary stream. Musculoskeletal Present- Back Pain, Joint Pain and Morning Stiffness. Not Present- Joint Swelling, Muscle Pain, Muscle Weakness and Spasms. Neurological Not Present- Blackout spells, Difficulty with balance, Dizziness, Paralysis, Tremor and Weakness. Psychiatric Not Present- Insomnia.  Vitals  Weight: 223 lb Height: 67in Weight was reported by patient. Height was reported by patient. Body Surface Area: 2.12 m Body Mass Index: 34.93 kg/m  Pulse: 70 (Regular)  BP: 108/54 (Sitting, Right Arm, Standard)  Physical Exam General Mental Status -Alert, cooperative and good historian. General Appearance-pleasant, Not in acute distress. Orientation-Oriented X3. Build & Nutrition-Well nourished and Well developed. Gait-Slow and cautious(Rollator Walker) and Antalgic.  Head and Neck Head-normocephalic,  atraumatic . Neck Global Assessment - supple, no bruit auscultated on the right, no bruit auscultated on the left.  Eye Pupil - Bilateral-Regular and Round. Motion - Bilateral-EOMI.  Chest and Lung Exam Auscultation Breath sounds - clear at anterior chest wall and clear at posterior chest wall. Adventitious sounds - No Adventitious sounds.  Cardiovascular Auscultation Rhythm - Regular rate and rhythm. Heart Sounds - S1 WNL and S2 WNL. Murmurs & Other Heart Sounds - Auscultation of the heart reveals - No Murmurs.  Abdomen Palpation/Percussion Tenderness - Abdomen is non-tender to palpation. Rigidity (guarding) - Abdomen is soft. Auscultation Auscultation of the abdomen reveals - Bowel sounds normal.  Male Genitourinary Note: Not done, not pertinent to present illness   Musculoskeletal Note: His LEFT hip can be flexed to 90 with no internal rotation, 20 of external rotation 20 of abduction. He has significant pain on range of motion of that LEFT hip. He has normal LEFT knee exam. The LEFT knee was replaced in the past. His gait pattern is antalgic on the LEFT.  Reviewed his recent radiographs AP pelvis lateral LEFT hip advanced end-stage arthritis of that LEFT hip with bone-on-bone change.   Assessment & Plan Primary osteoarthritis of left hip (M16.12) Aftercare following right hip joint replacement surgery (Z47.1, Z96.641)  Note:Surgical Plans: Left Total Hip Replacement - Anterior Approach  Disposition: Home with family, HHPT  PCP: Dr. Manuella Ghazi - Patient has been seen preoperatively and felt to be stable for surgery. Cards: Dr. Rayann Heman - Patient has been seen preoperatively and felt to be stable for surgery.  Topical TXA - CAD, CABG  Anesthesia Issues: None  Patient was instructed on what medications to stop prior to surgery.  Signed electronically by Joelene Millin, III PA-C

## 2017-07-03 NOTE — H&P (View-Only) (Signed)
Sean Schaumann DOB: 07/21/1941 Married / Language: English / Race: White Male Date of Admission:  07/18/2017 CC: Left hip pain History of Present Illness  The patient is a 76 year old male who comes in for a preoperative History and Physical. The patient is scheduled for a left total hip arthroplasty (anterior) to be performed by Dr. Dione Plover. Aluisio, MD at [facility] on 07-18-2017. The patient is a 76 year old male who presented for follow up of their hip. The patient is being followed for their hip pain and back pain. Symptoms reported include: pain, pain at night, pain with weightbearing and difficulty ambulating. The patient feels that they are doing poorly and report their pain level to be mild to moderate. Current treatment includes: NSAIDs and heat, topical ointments. The following medication has been used for pain control: antiinflammatory medication (Diclofenac). His LEFT hip is hurting at all times now. It is hurting as bad as the RIGHT one did prior to the time he had that fixed. Pain is in the groin rotating down his anterior thigh. It has gotten progressively worse over the past several months. He is at a stage he is ready go ahead and get it fixed. Reviewed his recent radiographs AP pelvis lateral LEFT hip advanced end-stage arthritis of that LEFT hip with bone-on-bone change. He has end-stage arthritis of that LEFT hip of progressively worsening pain and dysfunction. We replaced the other hip years ago he did great with that. We discussed doing a LEFT hip through an anterior approach. We discussed all that in detail and he wants to proceed with surgery. They have been treated conservatively in the past for the above stated problem and despite conservative measures, they continue to have progressive pain and severe functional limitations and dysfunction. They have failed non-operative management including home exercise, medications. It is felt that they would benefit from  undergoing total joint replacement. Risks and benefits of the procedure have been discussed with the patient and they elect to proceed with surgery. There are no active contraindications to surgery such as ongoing infection or rapidly progressive neurological disease.  Problem List/Past Medical Right hip pain (M25.551)  Bursitis, Hip (726.5)  Primary osteoarthritis of left hip (M16.12)  Diabetes Mellitus, Type II  High blood pressure  Hypercholesterolemia  Kidney Stone  Osteoarthritis  Sleep Apnea  uses CPAP Coronary Artery Disease/Heart Disease  Aneurysm  Pacemaker   Allergies  No Known Drug Allergies   Family History  Diabetes Mellitus  brother Heart Disease  First Degree Relatives. mother and father Heart disease in male family member before age 62  Kidney disease  mother  Social History Tobacco use  Former smoker. former smoker; smoke(d) 1 pack(s) per day Alcohol use  never consumed alcohol Current work status  retired Children  2 Pain Contract  no Drug/Alcohol Rehab (Currently)  no Drug/Alcohol Rehab (Previously)  no Exercise  Exercises rarely; does other Illicit drug use  no Living situation  live with spouse Marital status  married Number of flights of stairs before winded  1 Tobacco / smoke exposure  no  Medication History Co-Q-10 Active. Atorvastatin Calcium (40MG  Tablet, Oral) Active. Cozaar (100MG  Tablet, Oral) Active. Aspirin Adult Low Strength (81MG  Tablet DR, Oral) Active. Diclofenac Sodium (75MG  Tablet DR, Oral two times daily) Active. Fish Oil Burp-Less (1200MG  Capsule, Oral) Active. Metoprolol Tartrate (25MG  Tablet, Oral) Active. Amaryl (4MG  Tablet, Oral) Active. Multiple Vitamins/Minerals (Oral) Active. Actos (15MG  Tablet, Oral) Active.  Past Surgical History Spinal Fusion  lower back x3 Anal Fissure Repair  Coronary Artery Bypass Graft  Date: 2000. 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 Skin Not Present- Eczema, Hives, Itching, Lesions and Rash. HEENT Not Present- Dentures, Double Vision, Headache, Hearing Loss, Tinnitus and Visual Loss. Respiratory Present- Shortness of breath with exertion. Not Present- Allergies, Chronic Cough, Coughing up blood and Shortness of breath at rest. Cardiovascular Not Present- Chest Pain, Difficulty Breathing Lying Down, Murmur, Palpitations, Racing/skipping heartbeats and Swelling. Gastrointestinal Not Present- Abdominal Pain, Bloody Stool, Constipation, Diarrhea, Difficulty Swallowing, Heartburn, Jaundice, Loss of appetitie, Nausea and Vomiting. Male Genitourinary Present- Urinating at Night. Not Present- Blood in Urine, Discharge, Flank Pain, Incontinence, Painful Urination, Urgency, Urinary frequency, Urinary Retention and Weak urinary stream. Musculoskeletal Present- Back Pain, Joint Pain and Morning Stiffness. Not Present- Joint Swelling, Muscle Pain, Muscle Weakness and Spasms. Neurological Not Present- Blackout spells, Difficulty with balance, Dizziness, Paralysis, Tremor and Weakness. Psychiatric Not Present- Insomnia.  Vitals  Weight: 223 lb Height: 67in Weight was reported by patient. Height was reported by patient. Body Surface Area: 2.12 m Body Mass Index: 34.93 kg/m  Pulse: 70 (Regular)  BP: 108/54 (Sitting, Right Arm, Standard)  Physical Exam General Mental Status -Alert, cooperative and good historian. General Appearance-pleasant, Not in acute distress. Orientation-Oriented X3. Build & Nutrition-Well nourished and Well developed. Gait-Slow and cautious(Rollator Walker) and Antalgic.  Head and Neck Head-normocephalic,  atraumatic . Neck Global Assessment - supple, no bruit auscultated on the right, no bruit auscultated on the left.  Eye Pupil - Bilateral-Regular and Round. Motion - Bilateral-EOMI.  Chest and Lung Exam Auscultation Breath sounds - clear at anterior chest wall and clear at posterior chest wall. Adventitious sounds - No Adventitious sounds.  Cardiovascular Auscultation Rhythm - Regular rate and rhythm. Heart Sounds - S1 WNL and S2 WNL. Murmurs & Other Heart Sounds - Auscultation of the heart reveals - No Murmurs.  Abdomen Palpation/Percussion Tenderness - Abdomen is non-tender to palpation. Rigidity (guarding) - Abdomen is soft. Auscultation Auscultation of the abdomen reveals - Bowel sounds normal.  Male Genitourinary Note: Not done, not pertinent to present illness   Musculoskeletal Note: His LEFT hip can be flexed to 90 with no internal rotation, 20 of external rotation 20 of abduction. He has significant pain on range of motion of that LEFT hip. He has normal LEFT knee exam. The LEFT knee was replaced in the past. His gait pattern is antalgic on the LEFT.  Reviewed his recent radiographs AP pelvis lateral LEFT hip advanced end-stage arthritis of that LEFT hip with bone-on-bone change.   Assessment & Plan Primary osteoarthritis of left hip (M16.12) Aftercare following right hip joint replacement surgery (Z47.1, Z96.641)  Note:Surgical Plans: Left Total Hip Replacement - Anterior Approach  Disposition: Home with family, HHPT  PCP: Dr. Manuella Ghazi - Patient has been seen preoperatively and felt to be stable for surgery. Cards: Dr. Rayann Heman - Patient has been seen preoperatively and felt to be stable for surgery.  Topical TXA - CAD, CABG  Anesthesia Issues: None  Patient was instructed on what medications to stop prior to surgery.  Signed electronically by Joelene Millin, III PA-C

## 2017-07-10 NOTE — Patient Instructions (Addendum)
Sean GRUNEWALD  07/10/2017   Your procedure is scheduled on: 07-18-17  Report to [facility] Main  Entrance                 Report to admitting at     1115 AM   Call this number if you have problems the morning of surgery    [phone]   Remember: ONLY 1 PERSON MAY GO WITH YOU TO SHORT STAY TO GET  READY MORNING OF YOUR SURGERY.   Do not eat food  :After Midnight.  YOU MAY HAVE CLEAR LIQUIDS UNTIL 0745 AM THEN NOTHING BY MOUTH     Take these medicines the morning of surgery with A SIP OF WATER: METOPROLOL, ATORVASTATIN  DO NOT TAKE ANY DIABETIC MEDICATIONS DAY OF YOUR SURGERY                               You may not have any metal on your body including hair pins and              piercings  Do not wear jewelry,, lotions, powders or perfumes, deodorant                       Men may shave face and neck.   Do not bring valuables to the hospital. [place].  Contacts, dentures or bridgework may not be worn into surgery.  Leave suitcase in the car. After surgery it may be brought to your room.                Please read over the following fact sheets you were given: _____________________________________________________________________          [facility] - Preparing for Surgery Before surgery, you can play an important role.  Because skin is not sterile, your skin needs to be as free of germs as possible.  You can reduce the number of germs on your skin by washing with CHG (chlorahexidine gluconate) soap before surgery.  CHG is an antiseptic cleaner which kills germs and bonds with the skin to continue killing germs even after washing. Please DO NOT use if you have an allergy to CHG or antibacterial soaps.  If your skin becomes reddened/irritated stop using the CHG and inform your nurse when you arrive at Short Stay. Do not shave (including legs and underarms) for at least 48 hours prior to the first CHG  shower.  You may shave your face/neck. Please follow these instructions carefully:  1.  Shower with CHG Soap the night before surgery and the  morning of Surgery.  2.  If you choose to wash your hair, wash your hair first as usual with your  normal  shampoo.  3.  After you shampoo, rinse your hair and body thoroughly to remove the  shampoo.                           4.  Use CHG as you would any other liquid soap.  You can apply chg directly  to the skin and wash  Gently with a scrungie or clean washcloth.  5.  Apply the CHG Soap to your body ONLY FROM THE NECK DOWN.   Do not use on face/ open                           Wound or open sores. Avoid contact with eyes, ears mouth and genitals (private parts).                       Wash face,  Genitals (private parts) with your normal soap.             6.  Wash thoroughly, paying special attention to the area where your surgery  will be performed.  7.  Thoroughly rinse your body with warm water from the neck down.  8.  DO NOT shower/wash with your normal soap after using and rinsing off  the CHG Soap.                9.  Pat yourself dry with a clean towel.            10.  Wear clean pajamas.            11.  Place clean sheets on your bed the night of your first shower and do not  sleep with pets. Day of Surgery : Do not apply any lotions/deodorants the morning of surgery.  Please wear clean clothes to the hospital/surgery center.  FAILURE TO FOLLOW THESE INSTRUCTIONS MAY RESULT IN THE CANCELLATION OF YOUR SURGERY PATIENT SIGNATURE_________________________________  NURSE SIGNATURE__________________________________  ________________________________________________________________________    CLEAR LIQUID DIET  TILL 0745 AM THEN NOTHING BY MOUTH   Foods Allowed                                                                     Foods Excluded  Coffee and tea, regular and decaf                             liquids that you  cannot  Plain Jell-O in any flavor                                             see through such as: Fruit ices (not with fruit pulp)                                     milk, soups, orange juice  Iced Popsicles                                    All solid food Carbonated beverages, regular and diet                                    Cranberry, grape and apple juices Sports drinks like Gatorade Lightly seasoned clear  broth or consume(fat free) Sugar, honey syrup  Sample Menu Breakfast                                Lunch                                     Supper Cranberry juice                    Beef broth                            Chicken broth Jell-O                                     Grape juice                           Apple juice Coffee or tea                        Jell-O                                      Popsicle                                                Coffee or tea                        Coffee or tea  _____________________________________________________________________   WHAT IS A BLOOD TRANSFUSION? Blood Transfusion Information  A transfusion is the replacement of blood or some of its parts. Blood is made up of multiple cells which provide different functions.  Red blood cells carry oxygen and are used for blood loss replacement.  White blood cells fight against infection.  Platelets control bleeding.  Plasma helps clot blood.  Other blood products are available for specialized needs, such as hemophilia or other clotting disorders. BEFORE THE TRANSFUSION  Who gives blood for transfusions?   Healthy volunteers who are fully evaluated to make sure their blood is safe. This is blood bank blood. Transfusion therapy is the safest it has ever been in the practice of medicine. Before blood is taken from a donor, a complete history is taken to make sure that person has no history of diseases nor engages in risky social behavior (examples are intravenous drug use  or sexual activity with multiple partners). The donor's travel history is screened to minimize risk of transmitting infections, such as malaria. The donated blood is tested for signs of infectious diseases, such as HIV and hepatitis. The blood is then tested to be sure it is compatible with you in order to minimize the chance of a transfusion reaction. If you or a relative donates blood, this is often done in anticipation of surgery and is not appropriate for emergency situations. It takes many days to process the donated blood. RISKS AND COMPLICATIONS Although transfusion therapy is very safe and saves many  lives, the main dangers of transfusion include:   Getting an infectious disease.  Developing a transfusion reaction. This is an allergic reaction to something in the blood you were given. Every precaution is taken to prevent this. The decision to have a blood transfusion has been considered carefully by your caregiver before blood is given. Blood is not given unless the benefits outweigh the risks. AFTER THE TRANSFUSION  Right after receiving a blood transfusion, you will usually feel much better and more energetic. This is especially true if your red blood cells have gotten low (anemic). The transfusion raises the level of the red blood cells which carry oxygen, and this usually causes an energy increase.  The nurse administering the transfusion will monitor you carefully for complications. HOME CARE INSTRUCTIONS  No special instructions are needed after a transfusion. You may find your energy is better. Speak with your caregiver about any limitations on activity for underlying diseases you may have. SEEK MEDICAL CARE IF:   Your condition is not improving after your transfusion.  You develop redness or irritation at the intravenous (IV) site. SEEK IMMEDIATE MEDICAL CARE IF:  Any of the following symptoms occur over the next 12 hours:  Shaking chills.  You have a temperature by mouth  above 102 F (38.9 C), not controlled by medicine.  Chest, back, or muscle pain.  People around you feel you are not acting correctly or are confused.  Shortness of breath or difficulty breathing.  Dizziness and fainting.  You get a rash or develop hives.  You have a decrease in urine output.  Your urine turns a dark color or changes to pink, red, or brown. Any of the following symptoms occur over the next 10 days:  You have a temperature by mouth above 102 F (38.9 C), not controlled by medicine.  Shortness of breath.  Weakness after normal activity.  The white part of the eye turns yellow (jaundice).  You have a decrease in the amount of urine or are urinating less often.  Your urine turns a dark color or changes to pink, red, or brown. Document Released: 07/14/2000 Document Revised: 10/09/2011 Document Reviewed: 03/02/2008 [employer] Patient Information 2014 [employer], [place].  _______________________________________________________________________How to Manage Your Diabetes Before and After Surgery  Why is it important to control my blood sugar before and after surgery? . Improving blood sugar levels before and after surgery helps healing and can limit problems. . A way of improving blood sugar control is eating a healthy diet by: o  Eating less sugar and carbohydrates o  Increasing activity/exercise o  Talking with your doctor about reaching your blood sugar goals . High blood sugars (greater than 180 mg/dL) can raise your risk of infections and slow your recovery, so you will need to focus on controlling your diabetes during the weeks before surgery. . Make sure that the doctor who takes care of your diabetes knows about your planned surgery including the date and location.  How do I manage my blood sugar before surgery? . Check your blood sugar at least 4 times a day, starting 2 days before surgery, to make sure that the level is not too high or low. o Check your  blood sugar the morning of your surgery when you wake up and every 2 hours until you get to the Short Stay unit. . If your blood sugar is less than 70 mg/dL, you will need to treat for low blood sugar: o Do not take insulin. o Treat a low  blood sugar (less than 70 mg/dL) with  cup of clear juice (cranberry or apple), 4 glucose tablets, OR glucose gel. o Recheck blood sugar in 15 minutes after treatment (to make sure it is greater than 70 mg/dL). If your blood sugar is not greater than 70 mg/dL on recheck, call [phone] for further instructions. . Report your blood sugar to the short stay nurse when you get to Short Stay.  . If you are admitted to the hospital after surgery: o Your blood sugar will be checked by the staff and you will probably be given insulin after surgery (instead of oral diabetes medicines) to make sure you have good blood sugar levels. o The goal for blood sugar control after surgery is 80-180 mg/dL.   WHAT DO I DO ABOUT MY DIABETES MEDICATION?  Glimepiride take only morning/ lunch dose the day before surgery none day of  Actos take as usual the day befor surgery none the day of  . Do not take oral diabetes medicines (pills) the morning of surgery. Patient Signature:  Date:   Nurse Signature:  Date:   Reviewed and Endorsed by [facility] Patient Education Committee, August 2015  Incentive Spirometer  An incentive spirometer is a tool that can help keep your lungs clear and active. This tool measures how well you are filling your lungs with each breath. Taking long deep breaths may help reverse or decrease the chance of developing breathing (pulmonary) problems (especially infection) following:  A long period of time when you are unable to move or be active. BEFORE THE PROCEDURE   If the spirometer includes an indicator to show your best effort, your nurse or respiratory therapist will set it to a desired goal.  If possible, sit up straight or lean slightly  forward. Try not to slouch.  Hold the incentive spirometer in an upright position. INSTRUCTIONS FOR USE  1. Sit on the edge of your bed if possible, or sit up as far as you can in bed or on a chair. 2. Hold the incentive spirometer in an upright position. 3. Breathe out normally. 4. Place the mouthpiece in your mouth and seal your lips tightly around it. 5. Breathe in slowly and as deeply as possible, raising the piston or the ball toward the top of the column. 6. Hold your breath for 3-5 seconds or for as long as possible. Allow the piston or ball to fall to the bottom of the column. 7. Remove the mouthpiece from your mouth and breathe out normally. 8. Rest for a few seconds and repeat Steps 1 through 7 at least 10 times every 1-2 hours when you are awake. Take your time and take a few normal breaths between deep breaths. 9. The spirometer may include an indicator to show your best effort. Use the indicator as a goal to work toward during each repetition. 10. After each set of 10 deep breaths, practice coughing to be sure your lungs are clear. If you have an incision (the cut made at the time of surgery), support your incision when coughing by placing a pillow or rolled up towels firmly against it. Once you are able to get out of bed, walk around indoors and cough well. You may stop using the incentive spirometer when instructed by your caregiver.  RISKS AND COMPLICATIONS  Take your time so you do not get dizzy or light-headed.  If you are in pain, you may need to take or ask for pain medication before doing incentive spirometry.  It is harder to take a deep breath if you are having pain. AFTER USE  Rest and breathe slowly and easily.  It can be helpful to keep track of a log of your progress. Your caregiver can provide you with a simple table to help with this. If you are using the spirometer at home, follow these instructions: [place] IF:   You are having difficultly using the  spirometer.  You have trouble using the spirometer as often as instructed.  Your pain medication is not giving enough relief while using the spirometer.  You develop fever of 100.5 F (38.1 C) or higher. SEEK IMMEDIATE MEDICAL CARE IF:   You cough up bloody sputum that had not been present before.  You develop fever of 102 F (38.9 C) or greater.  You develop worsening pain at or near the incision site. MAKE SURE YOU:   Understand these instructions.  Will watch your condition.  Will get help right away if you are not doing well or get worse. Document Released: 11/27/2006 Document Revised: 10/09/2011 Document Reviewed: 01/28/2007 [facility] Patient Information 2014 [place], [place].   ________________________________________________________________________

## 2017-07-10 NOTE — Progress Notes (Signed)
CLEARANCE DR. [facility] 06-11-17 CHART  EKG 05-28-17 Epic  OVN 06-11-17 CHART  DR. [facility]  STRESS 06-07-17 Epic  LAST DEVICE CHECK 05-29-17 Epic   VASCULAR LAST OVN  01/16/17 Epic   STRESS 06-07-17 Epic  EF 52% READS LOW RISK STUDY PER OVN 05-28-17 CARDS IF NM IS A LOW RISK STUDY MAY PROCEED WITH SURGERY

## 2017-07-11 ENCOUNTER — Other Ambulatory Visit: Payer: Self-pay

## 2017-07-11 ENCOUNTER — Encounter (HOSPITAL_COMMUNITY)
Admission: RE | Admit: 2017-07-11 | Discharge: 2017-07-11 | Disposition: A | Discharge status: Home / Self Care | Payer: Medicare Other | Source: Ambulatory Visit | Attending: Orthopedic Surgery | Admitting: Orthopedic Surgery

## 2017-07-11 ENCOUNTER — Encounter (INDEPENDENT_AMBULATORY_CARE_PROVIDER_SITE_OTHER): Payer: Self-pay

## 2017-07-11 ENCOUNTER — Encounter (HOSPITAL_COMMUNITY): Payer: Self-pay

## 2017-07-11 DIAGNOSIS — I110 Hypertensive heart disease with heart failure: Secondary | ICD-10-CM | POA: Diagnosis not present

## 2017-07-11 DIAGNOSIS — I2510 Atherosclerotic heart disease of native coronary artery without angina pectoris: Secondary | ICD-10-CM | POA: Insufficient documentation

## 2017-07-11 DIAGNOSIS — E1142 Type 2 diabetes mellitus with diabetic polyneuropathy: Secondary | ICD-10-CM | POA: Insufficient documentation

## 2017-07-11 DIAGNOSIS — M1612 Unilateral primary osteoarthritis, left hip: Secondary | ICD-10-CM | POA: Insufficient documentation

## 2017-07-11 DIAGNOSIS — Z7982 Long term (current) use of aspirin: Secondary | ICD-10-CM | POA: Diagnosis not present

## 2017-07-11 DIAGNOSIS — Z01818 Encounter for other preprocedural examination: Secondary | ICD-10-CM | POA: Diagnosis not present

## 2017-07-11 DIAGNOSIS — Z79899 Other long term (current) drug therapy: Secondary | ICD-10-CM | POA: Diagnosis not present

## 2017-07-11 DIAGNOSIS — I495 Sick sinus syndrome: Secondary | ICD-10-CM | POA: Diagnosis not present

## 2017-07-11 HISTORY — DX: Dyspnea, unspecified: R06.00

## 2017-07-11 HISTORY — DX: Personal history of urinary calculi: Z87.442

## 2017-07-11 LAB — PROTIME-INR
INR: 0.98
PROTHROMBIN TIME: 12.9 s (ref 11.4–15.2)

## 2017-07-11 LAB — CBC
HEMATOCRIT: 39.5 % (ref 39.0–52.0)
HEMOGLOBIN: 13 g/dL (ref 13.0–17.0)
MCH: 30.3 pg (ref 26.0–34.0)
MCHC: 32.9 g/dL (ref 30.0–36.0)
MCV: 92.1 fL (ref 78.0–100.0)
Platelets: 167 10*3/uL (ref 150–400)
RBC: 4.29 MIL/uL (ref 4.22–5.81)
RDW: 14.6 % (ref 11.5–15.5)
WBC: 5.6 10*3/uL (ref 4.0–10.5)

## 2017-07-11 LAB — COMPREHENSIVE METABOLIC PANEL
ALBUMIN: 3.7 g/dL (ref 3.5–5.0)
ALT: 21 U/L (ref 17–63)
AST: 26 U/L (ref 15–41)
Alkaline Phosphatase: 135 U/L — ABNORMAL HIGH (ref 38–126)
Anion gap: 7 (ref 5–15)
BILIRUBIN TOTAL: 1 mg/dL (ref 0.3–1.2)
BUN: 41 mg/dL — ABNORMAL HIGH (ref 6–20)
CO2: 26 mmol/L (ref 22–32)
Calcium: 9.6 mg/dL (ref 8.9–10.3)
Chloride: 109 mmol/L (ref 101–111)
Creatinine, Ser: 1.89 mg/dL — ABNORMAL HIGH (ref 0.61–1.24)
GFR calc Af Amer: 38 mL/min — ABNORMAL LOW (ref >60)
GFR calc non Af Amer: 33 mL/min — ABNORMAL LOW (ref >60)
GLUCOSE: 104 mg/dL — AB (ref 65–99)
POTASSIUM: 4.8 mmol/L (ref 3.5–5.1)
Sodium: 142 mmol/L (ref 135–145)
TOTAL PROTEIN: 7.1 g/dL (ref 6.5–8.1)

## 2017-07-11 LAB — APTT: APTT: 32 s (ref 24–36)

## 2017-07-11 LAB — HEMOGLOBIN A1C
Hgb A1c MFr Bld: 6 % — ABNORMAL HIGH (ref 4.8–5.6)
Mean Plasma Glucose: 125.5 mg/dL

## 2017-07-11 LAB — GLUCOSE, CAPILLARY: GLUCOSE-CAPILLARY: 126 mg/dL — AB (ref 65–99)

## 2017-07-11 LAB — SURGICAL PCR SCREEN
MRSA, PCR: NEGATIVE
Staphylococcus aureus: POSITIVE — AB

## 2017-07-18 ENCOUNTER — Inpatient Hospital Stay (HOSPITAL_COMMUNITY): Payer: Medicare Other

## 2017-07-18 ENCOUNTER — Inpatient Hospital Stay (HOSPITAL_COMMUNITY): Payer: Medicare Other | Admitting: Anesthesiology

## 2017-07-18 ENCOUNTER — Other Ambulatory Visit: Payer: Self-pay

## 2017-07-18 ENCOUNTER — Encounter (HOSPITAL_COMMUNITY)
Admission: RE | Disposition: A | Discharge status: Home Health | Payer: Self-pay | Source: Ambulatory Visit | Attending: Orthopedic Surgery

## 2017-07-18 ENCOUNTER — Inpatient Hospital Stay (HOSPITAL_COMMUNITY)
Admission: RE | Admit: 2017-07-18 | Discharge: 2017-07-19 | DRG: 470 | Disposition: A | Discharge status: Home Health | Payer: Medicare Other | Source: Ambulatory Visit | Attending: Orthopedic Surgery | Admitting: Orthopedic Surgery

## 2017-07-18 ENCOUNTER — Encounter (HOSPITAL_COMMUNITY): Payer: Self-pay | Admitting: Anesthesiology

## 2017-07-18 DIAGNOSIS — Z96641 Presence of right artificial hip joint: Secondary | ICD-10-CM | POA: Diagnosis present

## 2017-07-18 DIAGNOSIS — Z950 Presence of cardiac pacemaker: Secondary | ICD-10-CM | POA: Diagnosis not present

## 2017-07-18 DIAGNOSIS — I495 Sick sinus syndrome: Secondary | ICD-10-CM | POA: Diagnosis present

## 2017-07-18 DIAGNOSIS — E1142 Type 2 diabetes mellitus with diabetic polyneuropathy: Secondary | ICD-10-CM | POA: Diagnosis not present

## 2017-07-18 DIAGNOSIS — Z471 Aftercare following joint replacement surgery: Secondary | ICD-10-CM | POA: Diagnosis not present

## 2017-07-18 DIAGNOSIS — Z87891 Personal history of nicotine dependence: Secondary | ICD-10-CM | POA: Diagnosis not present

## 2017-07-18 DIAGNOSIS — G4730 Sleep apnea, unspecified: Secondary | ICD-10-CM | POA: Diagnosis present

## 2017-07-18 DIAGNOSIS — M169 Osteoarthritis of hip, unspecified: Secondary | ICD-10-CM | POA: Diagnosis present

## 2017-07-18 DIAGNOSIS — Z79899 Other long term (current) drug therapy: Secondary | ICD-10-CM

## 2017-07-18 DIAGNOSIS — E669 Obesity, unspecified: Secondary | ICD-10-CM | POA: Diagnosis not present

## 2017-07-18 DIAGNOSIS — Z7984 Long term (current) use of oral hypoglycemic drugs: Secondary | ICD-10-CM

## 2017-07-18 DIAGNOSIS — J449 Chronic obstructive pulmonary disease, unspecified: Secondary | ICD-10-CM | POA: Diagnosis present

## 2017-07-18 DIAGNOSIS — Z96642 Presence of left artificial hip joint: Secondary | ICD-10-CM | POA: Diagnosis not present

## 2017-07-18 DIAGNOSIS — I10 Essential (primary) hypertension: Secondary | ICD-10-CM | POA: Diagnosis not present

## 2017-07-18 DIAGNOSIS — E119 Type 2 diabetes mellitus without complications: Secondary | ICD-10-CM | POA: Diagnosis present

## 2017-07-18 DIAGNOSIS — Z96653 Presence of artificial knee joint, bilateral: Secondary | ICD-10-CM | POA: Diagnosis present

## 2017-07-18 DIAGNOSIS — Z7982 Long term (current) use of aspirin: Secondary | ICD-10-CM

## 2017-07-18 DIAGNOSIS — E7800 Pure hypercholesterolemia, unspecified: Secondary | ICD-10-CM | POA: Diagnosis not present

## 2017-07-18 DIAGNOSIS — M1612 Unilateral primary osteoarthritis, left hip: Principal | ICD-10-CM | POA: Diagnosis present

## 2017-07-18 DIAGNOSIS — Z981 Arthrodesis status: Secondary | ICD-10-CM | POA: Diagnosis not present

## 2017-07-18 DIAGNOSIS — Z6834 Body mass index (BMI) 34.0-34.9, adult: Secondary | ICD-10-CM | POA: Diagnosis not present

## 2017-07-18 DIAGNOSIS — Z951 Presence of aortocoronary bypass graft: Secondary | ICD-10-CM

## 2017-07-18 DIAGNOSIS — Z96649 Presence of unspecified artificial hip joint: Secondary | ICD-10-CM

## 2017-07-18 DIAGNOSIS — I2510 Atherosclerotic heart disease of native coronary artery without angina pectoris: Secondary | ICD-10-CM | POA: Diagnosis not present

## 2017-07-18 DIAGNOSIS — Z419 Encounter for procedure for purposes other than remedying health state, unspecified: Secondary | ICD-10-CM

## 2017-07-18 HISTORY — PX: TOTAL HIP ARTHROPLASTY: SHX124

## 2017-07-18 LAB — GLUCOSE, CAPILLARY
GLUCOSE-CAPILLARY: 137 mg/dL — AB (ref 65–99)
GLUCOSE-CAPILLARY: 290 mg/dL — AB (ref 65–99)
Glucose-Capillary: 109 mg/dL — ABNORMAL HIGH (ref 65–99)
Glucose-Capillary: 212 mg/dL — ABNORMAL HIGH (ref 65–99)

## 2017-07-18 LAB — TYPE AND SCREEN
ABO/RH(D): O POS
ANTIBODY SCREEN: NEGATIVE

## 2017-07-18 SURGERY — ARTHROPLASTY, HIP, TOTAL, ANTERIOR APPROACH
Anesthesia: General | Site: Hip | Laterality: Left

## 2017-07-18 MED ORDER — LOSARTAN POTASSIUM 50 MG PO TABS
100.0000 mg | ORAL_TABLET | Freq: Every day | ORAL | Status: DC
  Administered 2017-07-19: 10:00:00 100 mg via ORAL
  Filled 2017-07-18: qty 2

## 2017-07-18 MED ORDER — POLYETHYLENE GLYCOL 3350 17 G PO PACK: 17.0000 g | PACK | Freq: Every day | ORAL | Status: DC | PRN

## 2017-07-18 MED ORDER — CEFAZOLIN SODIUM-DEXTROSE 2-4 GM/100ML-% IV SOLN
2.0000 g | Freq: Four times a day (QID) | INTRAVENOUS | Status: AC
  Administered 2017-07-18 – 2017-07-19 (×2): 2 g via INTRAVENOUS
  Filled 2017-07-18 (×2): qty 100

## 2017-07-18 MED ORDER — ACETAMINOPHEN 10 MG/ML IV SOLN
1000.0000 mg | Freq: Once | INTRAVENOUS | Status: AC
  Administered 2017-07-18: 1000 mg via INTRAVENOUS

## 2017-07-18 MED ORDER — FENTANYL CITRATE (PF) 100 MCG/2ML IJ SOLN
INTRAMUSCULAR | Status: DC | PRN
  Administered 2017-07-18: 50 ug via INTRAVENOUS
  Administered 2017-07-18: 25 ug via INTRAVENOUS
  Administered 2017-07-18: 50 ug via INTRAVENOUS
  Administered 2017-07-18: 25 ug via INTRAVENOUS
  Administered 2017-07-18: 50 ug via INTRAVENOUS
  Administered 2017-07-18: 25 ug via INTRAVENOUS
  Administered 2017-07-18: 75 ug via INTRAVENOUS

## 2017-07-18 MED ORDER — MORPHINE SULFATE (PF) 4 MG/ML IV SOLN: 1.0000 mg | INTRAVENOUS | Status: DC | PRN

## 2017-07-18 MED ORDER — METHOCARBAMOL 1000 MG/10ML IJ SOLN
500.0000 mg | Freq: Four times a day (QID) | INTRAVENOUS | Status: DC | PRN
  Administered 2017-07-18: 500 mg via INTRAVENOUS
  Filled 2017-07-18: qty 550

## 2017-07-18 MED ORDER — ONDANSETRON HCL 4 MG/2ML IJ SOLN: 4.0000 mg | Freq: Four times a day (QID) | INTRAMUSCULAR | Status: DC | PRN

## 2017-07-18 MED ORDER — BISACODYL 10 MG RE SUPP
10.0000 mg | Freq: Every day | RECTAL | Status: DC | PRN
Start: 2017-07-18 — End: 2017-07-19

## 2017-07-18 MED ORDER — DIPHENHYDRAMINE HCL 12.5 MG/5ML PO ELIX: 12.5000 mg | ORAL_SOLUTION | ORAL | Status: DC | PRN

## 2017-07-18 MED ORDER — ALBUMIN HUMAN 5 % IV SOLN
INTRAVENOUS | Status: AC
  Filled 2017-07-18: qty 250

## 2017-07-18 MED ORDER — EPHEDRINE SULFATE-NACL 50-0.9 MG/10ML-% IV SOSY
PREFILLED_SYRINGE | INTRAVENOUS | Status: DC | PRN
  Administered 2017-07-18 (×2): 5 mg via INTRAVENOUS
  Administered 2017-07-18: 10 mg via INTRAVENOUS
  Administered 2017-07-18 (×4): 5 mg via INTRAVENOUS

## 2017-07-18 MED ORDER — OXYCODONE HCL 5 MG PO TABS: 5.0000 mg | ORAL_TABLET | ORAL | Status: DC | PRN

## 2017-07-18 MED ORDER — PROPOFOL 10 MG/ML IV BOLUS
INTRAVENOUS | Status: AC
  Filled 2017-07-18: qty 20

## 2017-07-18 MED ORDER — ROCURONIUM BROMIDE 50 MG/5ML IV SOSY
PREFILLED_SYRINGE | INTRAVENOUS | Status: AC
  Filled 2017-07-18: qty 5

## 2017-07-18 MED ORDER — MENTHOL 3 MG MT LOZG: 1.0000 | LOZENGE | OROMUCOSAL | Status: DC | PRN

## 2017-07-18 MED ORDER — ACETAMINOPHEN 10 MG/ML IV SOLN
INTRAVENOUS | Status: AC
  Filled 2017-07-18: qty 100

## 2017-07-18 MED ORDER — FENTANYL CITRATE (PF) 100 MCG/2ML IJ SOLN
INTRAMUSCULAR | Status: AC
  Filled 2017-07-18: qty 2

## 2017-07-18 MED ORDER — OXYCODONE HCL 5 MG PO TABS: 10.0000 mg | ORAL_TABLET | ORAL | Status: DC | PRN

## 2017-07-18 MED ORDER — METOPROLOL SUCCINATE ER 50 MG PO TB24
50.0000 mg | ORAL_TABLET | Freq: Every day | ORAL | Status: DC
  Administered 2017-07-19: 50 mg via ORAL
  Filled 2017-07-18: qty 1

## 2017-07-18 MED ORDER — ATORVASTATIN CALCIUM 40 MG PO TABS
40.0000 mg | ORAL_TABLET | Freq: Every day | ORAL | Status: DC
  Administered 2017-07-19: 40 mg via ORAL
  Filled 2017-07-18: qty 1

## 2017-07-18 MED ORDER — LIDOCAINE 2% (20 MG/ML) 5 ML SYRINGE
INTRAMUSCULAR | Status: AC
  Filled 2017-07-18: qty 5

## 2017-07-18 MED ORDER — CEFAZOLIN SODIUM-DEXTROSE 2-4 GM/100ML-% IV SOLN
INTRAVENOUS | Status: AC
  Filled 2017-07-18: qty 100

## 2017-07-18 MED ORDER — PIOGLITAZONE HCL 15 MG PO TABS
15.0000 mg | ORAL_TABLET | Freq: Every day | ORAL | Status: DC
  Administered 2017-07-19: 15 mg via ORAL
  Filled 2017-07-18: qty 1

## 2017-07-18 MED ORDER — METOCLOPRAMIDE HCL 5 MG PO TABS
5.0000 mg | ORAL_TABLET | Freq: Three times a day (TID) | ORAL | Status: DC | PRN
Start: 2017-07-18 — End: 2017-07-19

## 2017-07-18 MED ORDER — ONDANSETRON HCL 4 MG/2ML IJ SOLN
INTRAMUSCULAR | Status: DC | PRN
  Administered 2017-07-18: 4 mg via INTRAVENOUS

## 2017-07-18 MED ORDER — LOSARTAN POTASSIUM 50 MG PO TABS: 100.0000 mg | ORAL_TABLET | Freq: Every day | ORAL | Status: DC

## 2017-07-18 MED ORDER — SUGAMMADEX SODIUM 200 MG/2ML IV SOLN
INTRAVENOUS | Status: AC
  Filled 2017-07-18: qty 2

## 2017-07-18 MED ORDER — DEXAMETHASONE SODIUM PHOSPHATE 10 MG/ML IJ SOLN
10.0000 mg | Freq: Once | INTRAMUSCULAR | Status: AC
  Administered 2017-07-18: 10 mg via INTRAVENOUS

## 2017-07-18 MED ORDER — MEPERIDINE HCL 50 MG/ML IJ SOLN: 6.2500 mg | INTRAMUSCULAR | Status: DC | PRN

## 2017-07-18 MED ORDER — BUPIVACAINE HCL (PF) 0.25 % IJ SOLN
INTRAMUSCULAR | Status: AC
  Filled 2017-07-18: qty 30

## 2017-07-18 MED ORDER — ACETAMINOPHEN 325 MG PO TABS: 650.0000 mg | ORAL_TABLET | ORAL | Status: DC | PRN

## 2017-07-18 MED ORDER — GLIMEPIRIDE 4 MG PO TABS
4.0000 mg | ORAL_TABLET | Freq: Every day | ORAL | Status: DC
  Administered 2017-07-19: 08:00:00 4 mg via ORAL
  Filled 2017-07-18: qty 1

## 2017-07-18 MED ORDER — LIDOCAINE 2% (20 MG/ML) 5 ML SYRINGE
INTRAMUSCULAR | Status: DC | PRN
  Administered 2017-07-18: 100 mg via INTRAVENOUS

## 2017-07-18 MED ORDER — ACETAMINOPHEN 650 MG RE SUPP: 650.0000 mg | RECTAL | Status: DC | PRN

## 2017-07-18 MED ORDER — PROPOFOL 10 MG/ML IV BOLUS
INTRAVENOUS | Status: DC | PRN
  Administered 2017-07-18: 50 mg via INTRAVENOUS
  Administered 2017-07-18: 120 mg via INTRAVENOUS

## 2017-07-18 MED ORDER — 0.9 % SODIUM CHLORIDE (POUR BTL) OPTIME
TOPICAL | Status: DC | PRN
  Administered 2017-07-18: 1000 mL

## 2017-07-18 MED ORDER — DEXAMETHASONE SODIUM PHOSPHATE 10 MG/ML IJ SOLN
10.0000 mg | Freq: Once | INTRAMUSCULAR | Status: AC
  Administered 2017-07-19: 10:00:00 10 mg via INTRAVENOUS
  Filled 2017-07-18: qty 1

## 2017-07-18 MED ORDER — DOCUSATE SODIUM 100 MG PO CAPS
100.0000 mg | ORAL_CAPSULE | Freq: Two times a day (BID) | ORAL | Status: DC
  Administered 2017-07-18 – 2017-07-19 (×2): 100 mg via ORAL
  Filled 2017-07-18 (×2): qty 1

## 2017-07-18 MED ORDER — METOCLOPRAMIDE HCL 5 MG/ML IJ SOLN: 5.0000 mg | Freq: Three times a day (TID) | INTRAMUSCULAR | Status: DC | PRN

## 2017-07-18 MED ORDER — FLEET ENEMA 7-19 GM/118ML RE ENEM: 1.0000 | ENEMA | Freq: Once | RECTAL | Status: DC | PRN

## 2017-07-18 MED ORDER — ACETAMINOPHEN 500 MG PO TABS
1000.0000 mg | ORAL_TABLET | Freq: Four times a day (QID) | ORAL | Status: AC
  Administered 2017-07-18 – 2017-07-19 (×4): 1000 mg via ORAL
  Filled 2017-07-18 (×4): qty 2

## 2017-07-18 MED ORDER — TRANEXAMIC ACID 1000 MG/10ML IV SOLN
2000.0000 mg | Freq: Once | INTRAVENOUS | Status: DC
  Filled 2017-07-18: qty 20

## 2017-07-18 MED ORDER — ONDANSETRON HCL 4 MG PO TABS: 4.0000 mg | ORAL_TABLET | Freq: Four times a day (QID) | ORAL | Status: DC | PRN

## 2017-07-18 MED ORDER — PHENOL 1.4 % MT LIQD: 1.0000 | OROMUCOSAL | Status: DC | PRN

## 2017-07-18 MED ORDER — LACTATED RINGERS IV SOLN
INTRAVENOUS | Status: DC
  Administered 2017-07-18 (×3): via INTRAVENOUS

## 2017-07-18 MED ORDER — ROCURONIUM BROMIDE 10 MG/ML (PF) SYRINGE
PREFILLED_SYRINGE | INTRAVENOUS | Status: DC | PRN
  Administered 2017-07-18: 50 mg via INTRAVENOUS

## 2017-07-18 MED ORDER — SODIUM CHLORIDE 0.9 % IV SOLN
INTRAVENOUS | Status: DC
  Administered 2017-07-18: 21:00:00 via INTRAVENOUS

## 2017-07-18 MED ORDER — RIVAROXABAN 10 MG PO TABS
10.0000 mg | ORAL_TABLET | Freq: Every day | ORAL | Status: DC
  Administered 2017-07-19: 10 mg via ORAL
  Filled 2017-07-18: qty 1

## 2017-07-18 MED ORDER — METHOCARBAMOL 500 MG PO TABS: 500.0000 mg | ORAL_TABLET | Freq: Four times a day (QID) | ORAL | Status: DC | PRN

## 2017-07-18 MED ORDER — FENTANYL CITRATE (PF) 100 MCG/2ML IJ SOLN: 25.0000 ug | INTRAMUSCULAR | Status: DC | PRN

## 2017-07-18 MED ORDER — SUGAMMADEX SODIUM 200 MG/2ML IV SOLN
INTRAVENOUS | Status: DC | PRN
  Administered 2017-07-18: 202.4 mg via INTRAVENOUS

## 2017-07-18 MED ORDER — BUPIVACAINE HCL (PF) 0.25 % IJ SOLN
INTRAMUSCULAR | Status: DC | PRN
  Administered 2017-07-18: 30 mL

## 2017-07-18 MED ORDER — CEFAZOLIN SODIUM-DEXTROSE 2-4 GM/100ML-% IV SOLN
2.0000 g | INTRAVENOUS | Status: AC
  Administered 2017-07-18: 2 g via INTRAVENOUS

## 2017-07-18 MED ORDER — CHLORHEXIDINE GLUCONATE 4 % EX LIQD: 60.0000 mL | Freq: Once | CUTANEOUS | Status: DC

## 2017-07-18 MED ORDER — ALBUMIN HUMAN 5 % IV SOLN
INTRAVENOUS | Status: DC | PRN
  Administered 2017-07-18: 14:00:00 via INTRAVENOUS

## 2017-07-18 MED ORDER — PHENYLEPHRINE 40 MCG/ML (10ML) SYRINGE FOR IV PUSH (FOR BLOOD PRESSURE SUPPORT)
PREFILLED_SYRINGE | INTRAVENOUS | Status: DC | PRN
  Administered 2017-07-18 (×5): 80 ug via INTRAVENOUS

## 2017-07-18 MED ORDER — PHENYLEPHRINE 40 MCG/ML (10ML) SYRINGE FOR IV PUSH (FOR BLOOD PRESSURE SUPPORT)
PREFILLED_SYRINGE | INTRAVENOUS | Status: AC
  Filled 2017-07-18: qty 10

## 2017-07-18 MED ORDER — INSULIN ASPART 100 UNIT/ML ~~LOC~~ SOLN
0.0000 [IU] | Freq: Three times a day (TID) | SUBCUTANEOUS | Status: DC
  Administered 2017-07-18: 17:00:00 5 [IU] via SUBCUTANEOUS
  Administered 2017-07-19: 08:00:00 3 [IU] via SUBCUTANEOUS

## 2017-07-18 SURGICAL SUPPLY — 35 items
BAG DECANTER FOR FLEXI CONT (MISCELLANEOUS) ×3 IMPLANT
BAG SPEC THK2 15X12 ZIP CLS (MISCELLANEOUS)
BAG ZIPLOCK 12X15 (MISCELLANEOUS) IMPLANT
BLADE SAG 18X100X1.27 (BLADE) ×3 IMPLANT
CAPT HIP TOTAL 2 ×2 IMPLANT
CLOSURE WOUND 1/2 X4 (GAUZE/BANDAGES/DRESSINGS) ×1
CLOTH BEACON ORANGE TIMEOUT ST (SAFETY) ×3 IMPLANT
COVER PERINEAL POST (MISCELLANEOUS) ×3 IMPLANT
COVER SURGICAL LIGHT HANDLE (MISCELLANEOUS) ×3 IMPLANT
DECANTER SPIKE VIAL GLASS SM (MISCELLANEOUS) ×3 IMPLANT
DRAPE STERI IOBAN 125X83 (DRAPES) ×3 IMPLANT
DRAPE U-SHAPE 47X51 STRL (DRAPES) ×6 IMPLANT
DRSG ADAPTIC 3X8 NADH LF (GAUZE/BANDAGES/DRESSINGS) ×3 IMPLANT
DRSG MEPILEX BORDER 4X4 (GAUZE/BANDAGES/DRESSINGS) ×3 IMPLANT
DRSG MEPILEX BORDER 4X8 (GAUZE/BANDAGES/DRESSINGS) ×3 IMPLANT
DURAPREP 26ML APPLICATOR (WOUND CARE) ×3 IMPLANT
ELECT REM PT RETURN 15FT ADLT (MISCELLANEOUS) ×3 IMPLANT
EVACUATOR 1/8 PVC DRAIN (DRAIN) ×3 IMPLANT
GLOVE BIO SURGEON STRL SZ7.5 (GLOVE) ×3 IMPLANT
GLOVE BIO SURGEON STRL SZ8 (GLOVE) ×6 IMPLANT
GLOVE BIOGEL PI IND STRL 8 (GLOVE) ×2 IMPLANT
GLOVE BIOGEL PI INDICATOR 8 (GLOVE) ×4
GOWN STRL REUS W/TWL LRG LVL3 (GOWN DISPOSABLE) ×3 IMPLANT
GOWN STRL REUS W/TWL XL LVL3 (GOWN DISPOSABLE) ×3 IMPLANT
PACK ANTERIOR HIP CUSTOM (KITS) ×3 IMPLANT
STRIP CLOSURE SKIN 1/2X4 (GAUZE/BANDAGES/DRESSINGS) ×2 IMPLANT
SUT ETHIBOND NAB CT1 #1 30IN (SUTURE) ×3 IMPLANT
SUT MNCRL AB 4-0 PS2 18 (SUTURE) ×3 IMPLANT
SUT STRATAFIX 0 PDS 27 VIOLET (SUTURE) ×3
SUT VIC AB 2-0 CT1 27 (SUTURE) ×9
SUT VIC AB 2-0 CT1 TAPERPNT 27 (SUTURE) ×2 IMPLANT
SUTURE STRATFX 0 PDS 27 VIOLET (SUTURE) ×1 IMPLANT
SYR 50ML LL SCALE MARK (SYRINGE) IMPLANT
TRAY FOLEY W/METER SILVER 16FR (SET/KITS/TRAYS/PACK) ×3 IMPLANT
YANKAUER SUCT BULB TIP 10FT TU (MISCELLANEOUS) ×3 IMPLANT

## 2017-07-18 NOTE — Transfer of Care (Signed)
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

## 2017-07-18 NOTE — Discharge Instructions (Addendum)
° °Dr. Frank Aluisio °Total Joint Specialist °[facility] °[address] °, [place] [address] °[phone] ° °ANTERIOR APPROACH TOTAL HIP REPLACEMENT POSTOPERATIVE DIRECTIONS ° ° °Hip Rehabilitation, Guidelines Following Surgery  °The results of a hip operation are greatly improved after range of motion and muscle strengthening exercises. Follow all safety measures which are given to protect your hip. If any of these exercises cause increased pain or swelling in your joint, decrease the amount until you are comfortable again. Then slowly increase the exercises. Call your caregiver if you have problems or questions.  ° °HOME CARE INSTRUCTIONS  °Remove items at home which could result in a fall. This includes throw rugs or furniture in walking pathways.  °· ICE to the affected hip every three hours for 30 minutes at a time and then as needed for pain and swelling.  Continue to use ice on the hip for pain and swelling from surgery. You may notice swelling that will progress down to the foot and ankle.  This is normal after surgery.  Elevate the leg when you are not up walking on it.   °· Continue to use the breathing machine which will help keep your temperature down.  It is common for your temperature to cycle up and down following surgery, especially at night when you are not up moving around and exerting yourself.  The breathing machine keeps your lungs expanded and your temperature down. ° ° °DIET °You may resume your previous home diet once your are discharged from the hospital. ° °DRESSING / WOUND CARE / SHOWERING °You may shower 3 days after surgery, but keep the wounds dry during showering.  You may use an occlusive plastic wrap (Press'n Seal for example), NO SOAKING/SUBMERGING IN THE BATHTUB.  If the bandage gets wet, change with a clean dry gauze.  If the incision gets wet, pat the wound dry with a clean towel. °You may start showering once you are discharged home but do not  submerge the incision under water. Just pat the incision dry and apply a dry gauze dressing on daily. °Change the surgical dressing daily and reapply a dry dressing each time. ° °ACTIVITY °Walk with your walker as instructed. °Use walker as long as suggested by your caregivers. °Avoid periods of inactivity such as sitting longer than an hour when not asleep. This helps prevent blood clots.  °You may resume a sexual relationship in one month or when given the OK by your doctor.  °You may return to work once you are cleared by your doctor.  °Do not drive a car for 6 weeks or until released by you surgeon.  °Do not drive while taking narcotics. ° °WEIGHT BEARING °Weight bearing as tolerated with assist device (walker, cane, etc) as directed, use it as long as suggested by your surgeon or therapist, typically at least 4-6 weeks. ° °POSTOPERATIVE CONSTIPATION PROTOCOL °Constipation - defined medically as fewer than three stools per week and severe constipation as less than one stool per week. ° °One of the most common issues patients have following surgery is constipation.  Even if you have a regular bowel pattern at home, your normal regimen is likely to be disrupted due to multiple reasons following surgery.  Combination of anesthesia, postoperative narcotics, change in appetite and fluid intake all can affect your bowels.  In order to avoid complications following surgery, here are some recommendations in order to help you during your recovery period. ° °Colace (docusate) - Pick up an over-the-counter   form of Colace or another stool softener and take twice a day as long as you are requiring postoperative pain medications.  Take with a full glass of water daily.  If you experience loose stools or diarrhea, hold the colace until you stool forms back up.  If your symptoms do not get better within 1 week or if they get worse, check with your doctor. ° °Dulcolax (bisacodyl) - Pick up over-the-counter and take as directed  by the product packaging as needed to assist with the movement of your bowels.  Take with a full glass of water.  Use this product as needed if not relieved by Colace only.  ° °MiraLax (polyethylene glycol) - Pick up over-the-counter to have on hand.  MiraLax is a solution that will increase the amount of water in your bowels to assist with bowel movements.  Take as directed and can mix with a glass of water, juice, soda, coffee, or tea.  Take if you go more than two days without a movement. °Do not use MiraLax more than once per day. Call your doctor if you are still constipated or irregular after using this medication for 7 days in a row. ° °If you continue to have problems with postoperative constipation, please contact the office for further assistance and recommendations.  If you experience "the worst abdominal pain ever" or develop nausea or vomiting, please contact the office immediatly for further recommendations for treatment. ° °ITCHING ° If you experience itching with your medications, try taking only a single pain pill, or even half a pain pill at a time.  You can also use Benadryl over the counter for itching or also to help with sleep.  ° °TED HOSE STOCKINGS °Wear the elastic stockings on both legs for three weeks following surgery during the day but you may remove then at night for sleeping. ° °MEDICATIONS °See your medication summary on the “After Visit Summary” that the nursing staff will review with you prior to discharge.  You may have some home medications which will be placed on hold until you complete the course of blood thinner medication.  It is important for you to complete the blood thinner medication as prescribed by your surgeon.  Continue your approved medications as instructed at time of discharge. ° °PRECAUTIONS °If you experience chest pain or shortness of breath - call 911 immediately for transfer to the hospital emergency department.  °If you develop a fever greater that 101 F,  purulent drainage from wound, increased redness or drainage from wound, foul odor from the wound/dressing, or calf pain - CONTACT YOUR SURGEON.   °                                                °FOLLOW-UP APPOINTMENTS °Make sure you keep all of your appointments after your operation with your surgeon and caregivers. You should call the office at the above phone number and make an appointment for approximately two weeks after the date of your surgery or on the date instructed by your surgeon outlined in the "After Visit Summary". ° °RANGE OF MOTION AND STRENGTHENING EXERCISES  °These exercises are designed to help you keep full movement of your hip joint. Follow your caregiver's or physical therapist's instructions. Perform all exercises about fifteen times, three times per day or as directed. Exercise both hips, even if you   have had only one joint replacement. These exercises can be done on a training (exercise) mat, on the floor, on a table or on a bed. Use whatever works the best and is most comfortable for you. Use music or television while you are exercising so that the exercises are a pleasant break in your day. This will make your life better with the exercises acting as a break in routine you can look forward to.  °Lying on your back, slowly slide your foot toward your buttocks, raising your knee up off the floor. Then slowly slide your foot back down until your leg is straight again.  °Lying on your back spread your legs as far apart as you can without causing discomfort.  °Lying on your side, raise your upper leg and foot straight up from the floor as far as is comfortable. Slowly lower the leg and repeat.  °Lying on your back, tighten up the muscle in the front of your thigh (quadriceps muscles). You can do this by keeping your leg straight and trying to raise your heel off the floor. This helps strengthen the largest muscle supporting your knee.  °Lying on your back, tighten up the muscles of your  buttocks both with the legs straight and with the knee bent at a comfortable angle while keeping your heel on the floor.  ° °IF YOU ARE TRANSFERRED TO A SKILLED REHAB FACILITY °If the patient is transferred to a skilled rehab facility following release from the hospital, a list of the current medications will be sent to the facility for the patient to continue.  When discharged from the skilled rehab facility, please have the facility set up the patient's Home Health Physical Therapy prior to being released. Also, the skilled facility will be responsible for providing the patient with their medications at time of release from the facility to include their pain medication, the muscle relaxants, and their blood thinner medication. If the patient is still at the rehab facility at time of the two week follow up appointment, the skilled rehab facility will also need to assist the patient in arranging follow up appointment in our office and any transportation needs. ° °MAKE SURE YOU:  °Understand these instructions.  °Get help right away if you are not doing well or get worse.  ° ° °Pick up stool softner and laxative for home use following surgery while on pain medications. °Do not submerge incision under water. °Please use good hand washing techniques while changing dressing each day. °May shower starting three days after surgery. °Please use a clean towel to pat the incision dry following showers. °Continue to use ice for pain and swelling after surgery. °Do not use any lotions or creams on the incision until instructed by your surgeon. ° °Take Xarelto for two and a half more weeks following discharge from the hospital, then discontinue Xarelto. °Once the patient has completed the Xarelto, they may resume the 81 mg Aspirin. ° ° ° °Information on my medicine - XARELTO® (Rivaroxaban) ° °This medication education was reviewed with me or my healthcare representative as part of my discharge preparation.  ° °Why was Xarelto®  prescribed for you? °Xarelto® was prescribed for you to reduce the risk of blood clots forming after orthopedic surgery. The medical term for these abnormal blood clots is venous thromboembolism (VTE). ° °What do you need to know about xarelto® ? °Take your Xarelto® ONCE DAILY at the same time every day. °You may take it either with or without food. ° °  If you have difficulty swallowing the tablet whole, you may crush it and mix in applesauce just prior to taking your dose. ° °Take Xarelto® exactly as prescribed by your doctor and DO NOT stop taking Xarelto® without talking to the doctor who prescribed the medication.  Stopping without other VTE prevention medication to take the place of Xarelto® may increase your risk of developing a clot. ° °After discharge, you should have regular check-up appointments with your healthcare provider that is prescribing your Xarelto®.   ° °What do you do if you miss a dose? °If you miss a dose, take it as soon as you remember on the same day then continue your regularly scheduled once daily regimen the next day. Do not take two doses of Xarelto® on the same day.  ° °Important Safety Information °A possible side effect of Xarelto® is bleeding. You should call your healthcare provider right away if you experience any of the following: °? Bleeding from an injury or your nose that does not stop. °? Unusual colored urine (red or dark brown) or unusual colored stools (red or black). °? Unusual bruising for unknown reasons. °? A serious fall or if you hit your head (even if there is no bleeding). ° °Some medicines may interact with Xarelto® and might increase your risk of bleeding while on Xarelto®. To help avoid this, consult your healthcare provider or pharmacist prior to using any new prescription or non-prescription medications, including herbals, vitamins, non-steroidal anti-inflammatory drugs (NSAIDs) and supplements. ° °This website has more information on Xarelto®:  www.xarelto.com. ° ° °

## 2017-07-18 NOTE — Anesthesia Procedure Notes (Signed)
Procedure Name: Intubation Date/Time: 07/18/2017 12:43 PM Performed by: Lavina Hamman, CRNA Pre-anesthesia Checklist: Patient identified, Emergency Drugs available, Suction available, Patient being monitored and Timeout performed Patient Re-evaluated:Patient Re-evaluated prior to induction Oxygen Delivery Method: Circle system utilized Preoxygenation: Pre-oxygenation with 100% oxygen Induction Type: IV induction Ventilation: Mask ventilation without difficulty Laryngoscope Size: Mac and 4 Grade View: Grade I Tube type: Oral Tube size: 7.5 mm Number of attempts: 1 Airway Equipment and Method: Stylet Placement Confirmation: ETT inserted through vocal cords under direct vision,  positive ETCO2,  CO2 detector and breath sounds checked- equal and bilateral Secured at: 22 cm Tube secured with: Tape Dental Injury: Teeth and Oropharynx as per pre-operative assessment

## 2017-07-18 NOTE — Op Note (Signed)
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   POSTOPERATIVE DIAGNOSIS: Osteoarthritis of the Left  hip.   PROCEDURE: Left total hip arthroplasty, anterior approach.   SURGEON: [name], MD   ASSISTANT: Arlee Muslim, PA-C  ANESTHESIA:  Spinal  ESTIMATED BLOOD LOSS:-850 mL    DRAINS: Hemovac x1.   COMPLICATIONS: None   CONDITION: PACU - hemodynamically stable.   BRIEF CLINICAL NOTE: [name] is a 76 y.o. male who has advanced end-  stage arthritis of their Left  hip with progressively worsening pain and  dysfunction.The patient has failed nonoperative management and presents for  total hip arthroplasty.   PROCEDURE IN DETAIL: After successful administration of spinal  anesthetic, the traction boots for the [facility] bed were placed on both  feet and the patient was placed onto the [facility] bed, boots placed into the leg  holders. The Left hip was then isolated from the perineum with plastic  drapes and prepped and draped in the usual sterile fashion. ASIS and  greater trochanter were marked and a oblique incision was made, starting  at about 1 cm lateral and 2 cm distal to the ASIS and coursing towards  the anterior cortex of the femur. The skin was cut with a 10 blade  through subcutaneous tissue to the level of the fascia overlying the  tensor fascia lata muscle. The fascia was then incised in line with the  incision at the junction of the anterior third and posterior 2/3rd. The  muscle was teased off the fascia and then the interval between the TFL  and the rectus was developed. The Hohmann retractor was then placed at  the top of the femoral neck over the capsule. The vessels overlying the  capsule were cauterized and the fat on top of the capsule was removed.  A Hohmann retractor was then placed anterior underneath the rectus  femoris to give exposure to the entire anterior capsule. A T-shaped   capsulotomy was performed. The edges were tagged and the femoral head  was identified.       Osteophytes are removed off the superior acetabulum.  The femoral neck was then cut in situ with an oscillating saw. Traction  was then applied to the left lower extremity utilizing the [facility]  traction. The femoral head was then removed. Retractors were placed  around the acetabulum and then circumferential removal of the labrum was  performed. Osteophytes were also removed. Reaming starts at 49 mm to  medialize and  Increased in 2 mm increments to 53 mm. We reamed in  approximately 40 degrees of abduction, 20 degrees anteversion. A 54 mm  pinnacle acetabular shell was then impacted in anatomic position under  fluoroscopic guidance with excellent purchase. We did not need to place  any additional dome screws. A 36 mm neutral + 4 marathon liner was then  placed into the acetabular shell.       The femoral lift was then placed along the lateral aspect of the femur  just distal to the vastus ridge. The leg was  externally rotated and capsule  was stripped off the inferior aspect of the femoral neck down to the  level of the lesser trochanter, this was done with electrocautery. The femur was lifted after this was performed. The  leg was then placed in an extended and adducted position essentially delivering the femur. We also removed the capsule superiorly and the piriformis from the piriformis  fossa to gain excellent exposure of the  proximal femur. Rongeur was used to remove some cancellous bone to get  into the lateral portion of the proximal femur for placement of the  initial starter reamer. The starter broaches was placed  the starter broach  and was shown to go down the center of the canal. Broaching  with the  Corail system was then performed starting at size 8, coursing  Up to size 11. A size 11 had excellent torsional and rotational  and axial stability. The trial standard offset neck was then  placed  with a 36 + 15 trial head. The hip was then reduced. We confirmed that  the stem was in the canal both on AP and lateral x-rays. It also has excellent sizing. The hip was reduced with outstanding stability through full extension and full external rotation.. AP pelvis was taken and the leg lengths were measured and found to be equal. Hip was then dislocated again and the femoral head and neck removed. The  femoral broach was removed. Size 11 Corail stem with a standard offset  neck was then impacted into the femur following native anteversion. Has  excellent purchase in the canal. Excellent torsional and rotational and  axial stability. It is confirmed to be in the canal on AP and lateral  fluoroscopic views. The 36 + 15 metal head was placed and the hip  reduced with outstanding stability. Again AP pelvis was taken and it  confirmed that the leg lengths were equal. The wound was then copiously  irrigated with saline solution and the capsule reattached and repaired  with Ethibond suture. 30 ml of .25% Bupivicaine was  injected into the capsule and into the edge of the tensor fascia lata as well as subcutaneous tissue. The fascia overlying the tensor fascia lata was then closed with a running #1 V-Loc. Subcu was closed with interrupted 2-0 Vicryl and subcuticular running 4-0 Monocryl. Incision was cleaned  and dried. Steri-Strips and a bulky sterile dressing applied. Hemovac  drain was hooked to suction and then the patient was awakened and transported to  recovery in stable condition.        Please note that a surgical assistant was a medical necessity for this procedure to perform it in a safe and expeditious manner. Assistant was necessary to provide appropriate retraction of vital neurovascular structures and to prevent femoral fracture and allow for anatomic placement of the prosthesis.  [name], M.D.

## 2017-07-18 NOTE — Anesthesia Postprocedure Evaluation (Signed)
Anesthesia Post Note  Patient: [name]  Procedure(s) Performed: LEFT TOTAL HIP ARTHROPLASTY ANTERIOR APPROACH (Left Hip)     Patient location during evaluation: PACU Anesthesia Type: General Level of consciousness: awake and alert Pain management: pain level controlled Vital Signs Assessment: post-procedure vital signs reviewed and stable Respiratory status: spontaneous breathing, nonlabored ventilation, respiratory function stable and patient connected to nasal cannula oxygen Cardiovascular status: blood pressure returned to baseline and stable Postop Assessment: no apparent nausea or vomiting Anesthetic complications: no    Last Vitals:  Vitals:   07/18/17 1530 07/18/17 1545  BP: 127/66 112/64  Pulse: 69 67  Resp: 18 15  Temp:  36.7 C  SpO2: 97% 98%    Last Pain:  Vitals:   07/18/17 1545  TempSrc:   PainSc: 0-No pain    LLE Motor Response: Purposeful movement (07/18/17 1545) LLE Sensation: Full sensation (07/18/17 1545) RLE Motor Response: Purposeful movement (07/18/17 1545) RLE Sensation: Full sensation (07/18/17 1545)      Evolette Pendell

## 2017-07-18 NOTE — Anesthesia Preprocedure Evaluation (Addendum)
Anesthesia Evaluation  Patient identified by MRN, date of birth, ID band Patient awake    Reviewed: Allergy & Precautions, NPO status , Patient's Chart, lab work & pertinent test results  Airway Mallampati: II  TM Distance: >3 FB Neck ROM: Full    Dental no notable dental hx.    Pulmonary neg pulmonary ROS, sleep apnea and Continuous Positive Airway Pressure Ventilation , former smoker,    Pulmonary exam normal breath sounds clear to auscultation       Cardiovascular hypertension, Pt. on medications and Pt. on home beta blockers + CAD  negative cardio ROS Normal cardiovascular exam+ pacemaker  Rhythm:Regular Rate:Normal     Neuro/Psych negative neurological ROS  negative psych ROS   GI/Hepatic negative GI ROS, Neg liver ROS,   Endo/Other  negative endocrine ROSdiabetes, Type 2  Renal/GU negative Renal ROS  negative genitourinary   Musculoskeletal negative musculoskeletal ROS (+) Arthritis ,   Abdominal   Peds negative pediatric ROS (+)  Hematology negative hematology ROS (+)   Anesthesia Other Findings Myoview 18   No diagnostic ST segment changes to indicate ischemia.  Small, moderate intensity, apical to basal inferolateral defect that is partially reversible and suggestive of scar with mild peri-infarct ischemia.  This is a low risk study.  Nuclear stress EF: 52%.  CT Lumbar Spine 5/17 review shows extensive hardware from T11- -S1 bilateral   Reproductive/Obstetrics negative OB ROS                           Anesthesia Physical Anesthesia Plan  ASA: III  Anesthesia Plan: General   Post-op Pain Management:    Induction: Intravenous  PONV Risk Score and Plan: 1 and Treatment may vary due to age or medical condition, Ondansetron and Propofol infusion  Airway Management Planned: LMA and Oral ETT  Additional Equipment:   Intra-op Plan:   Post-operative Plan: Extubation in  OR  Informed Consent: I have reviewed the patients History and Physical, chart, labs and discussed the procedure including the risks, benefits and alternatives for the proposed anesthesia with the patient or authorized representative who has indicated his/her understanding and acceptance.     Plan Discussed with: CRNA and Surgeon  Anesthesia Plan Comments: (  )        Anesthesia Quick Evaluation

## 2017-07-18 NOTE — Interval H&P Note (Signed)
History and Physical Interval Note:  07/18/2017 11:24 AM  [name]  has presented today for surgery, with the diagnosis of Osteoarthritis Left Hip  The various methods of treatment have been discussed with the patient and family. After consideration of risks, benefits and other options for treatment, the patient has consented to  Procedure(s): LEFT TOTAL HIP ARTHROPLASTY ANTERIOR APPROACH (Left) as a surgical intervention .  The patient's history has been reviewed, patient examined, no change in status, stable for surgery.  I have reviewed the patient's chart and labs.  Questions were answered to the patient's satisfaction.     Pilar Plate [name]

## 2017-07-19 LAB — CBC
HCT: 29.6 % — ABNORMAL LOW (ref 39.0–52.0)
Hemoglobin: 9.8 g/dL — ABNORMAL LOW (ref 13.0–17.0)
MCH: 30.2 pg (ref 26.0–34.0)
MCHC: 33.1 g/dL (ref 30.0–36.0)
MCV: 91.4 fL (ref 78.0–100.0)
PLATELETS: 144 10*3/uL — AB (ref 150–400)
RBC: 3.24 MIL/uL — AB (ref 4.22–5.81)
RDW: 14.3 % (ref 11.5–15.5)
WBC: 9.6 10*3/uL (ref 4.0–10.5)

## 2017-07-19 LAB — BASIC METABOLIC PANEL
Anion gap: 6 (ref 5–15)
BUN: 30 mg/dL — AB (ref 6–20)
CO2: 24 mmol/L (ref 22–32)
CREATININE: 1.38 mg/dL — AB (ref 0.61–1.24)
Calcium: 8.7 mg/dL — ABNORMAL LOW (ref 8.9–10.3)
Chloride: 107 mmol/L (ref 101–111)
GFR calc Af Amer: 56 mL/min — ABNORMAL LOW (ref >60)
GFR, EST NON AFRICAN AMERICAN: 48 mL/min — AB (ref >60)
Glucose, Bld: 221 mg/dL — ABNORMAL HIGH (ref 65–99)
Potassium: 4.1 mmol/L (ref 3.5–5.1)
SODIUM: 137 mmol/L (ref 135–145)

## 2017-07-19 LAB — GLUCOSE, CAPILLARY
Glucose-Capillary: 179 mg/dL — ABNORMAL HIGH (ref 65–99)
Glucose-Capillary: 100 mg/dL — ABNORMAL HIGH (ref 65–99)

## 2017-07-19 MED ORDER — METHOCARBAMOL 500 MG PO TABS: 500.0000 mg | ORAL_TABLET | Freq: Four times a day (QID) | ORAL | 0 refills | Status: DC | PRN

## 2017-07-19 MED ORDER — RIVAROXABAN 10 MG PO TABS: 10.0000 mg | ORAL_TABLET | Freq: Every day | ORAL | 0 refills | Status: DC

## 2017-07-19 MED ORDER — OXYCODONE HCL 5 MG PO TABS: 5.0000 mg | ORAL_TABLET | ORAL | 0 refills | Status: DC | PRN

## 2017-07-19 NOTE — Progress Notes (Signed)
   Subjective: 1 Day Post-Op Procedure(s) (LRB): LEFT TOTAL HIP ARTHROPLASTY ANTERIOR APPROACH (Left) Patient reports pain as mild.   Patient seen in rounds by Dr. Wynelle Link. Patient is well, but has had some minor complaints of pain in the hip, requiring pain medications We will start therapy today.  If they do well with therapy and meets all goals, then will allow home later this afternoon following therapy. Plan is to go Home after hospital stay.  Objective: Vital signs in last 24 hours: Temp:  [97.5 F (36.4 C)-98.3 F (36.8 C)] 97.8 F (36.6 C) (12/20 0616) Pulse Rate:  [67-81] 67 (12/20 0616) Resp:  [14-19] 16 (12/20 0616) BP: (101-155)/(60-84) 145/66 (12/20 0616) SpO2:  [94 %-100 %] 95 % (12/20 0616) Weight:  [101.2 kg (223 lb)] 101.2 kg (223 lb) (12/19 1212)  Intake/Output from previous day:  Intake/Output Summary (Last 24 hours) at 07/19/2017 0731 Last data filed at 07/19/2017 0600 Gross per 24 hour  Intake 3761.67 ml  Output 2970 ml  Net 791.67 ml    Intake/Output this shift: No intake/output data recorded.  Labs: Recent Labs    07/19/17 0533  HGB 9.8*   Recent Labs    07/19/17 0533  WBC 9.6  RBC 3.24*  HCT 29.6*  PLT 144*   Recent Labs    07/19/17 0533  NA 137  K 4.1  CL 107  CO2 24  BUN 30*  CREATININE 1.38*  GLUCOSE 221*  CALCIUM 8.7*   No results for input(s): LABPT, INR in the last 72 hours.  EXAM General - Patient is Alert, Appropriate and Oriented Extremity - Neurovascular intact Sensation intact distally Intact pulses distally Dorsiflexion/Plantar flexion intact Dressing - dressing C/D/I Motor Function - intact, moving foot and toes well on exam.  Hemovac pulled without difficulty.  Past Medical History:  Diagnosis Date  . AAA (abdominal aortic aneurysm) ([place])   . COPD (chronic obstructive pulmonary disease) ([place])   . Coronary artery disease 2000   Status post CABG  . Diabetes mellitus    DM2  . Dyslipidemia   . Dyspnea    . History of kidney stones   . Hypertension   . Obesity   . Osteoarthritis   . Sleep apnea    USING CPAP  . Spinal stenosis   . SSS (sick sinus syndrome) ([place]) 2000   s/p PPM    Assessment/Plan: 1 Day Post-Op Procedure(s) (LRB): LEFT TOTAL HIP ARTHROPLASTY ANTERIOR APPROACH (Left) Principal Problem:   OA (osteoarthritis) of hip  Estimated body mass index is 34.93 kg/m as calculated from the following:   Height as of this encounter: 5\' 7"  (1.702 m).   Weight as of this encounter: 101.2 kg (223 lb). Up with therapy Discharge home with home health  DVT Prophylaxis - Xarelto Weight Bearing As Tolerated left Leg Hemovac Pulled Begin Therapy  If meets goals and able to go home: Up with therapy Discharge home with home health Diet - Cardiac diet and Diabetic diet Follow up - in 2 weeks Activity - WBAT Disposition - Home Condition Upon Discharge - pending therapy D/C Meds - See DC Summary DVT Prophylaxis - Xarelto  Arlee Muslim, PA-C Orthopaedic Surgery 07/19/2017, 7:31 AM

## 2017-07-19 NOTE — Discharge Summary (Signed)
Physician Discharge Summary   Patient ID: KAZDEN LARGO MRN: [id number] DOB/AGE: 08-19-1940 76 y.o.  Admit date: 07/18/2017 Discharge date: 07-19-2017  Primary Diagnosis:  Osteoarthritis of the Left  hip.    Admission Diagnoses:  Past Medical History:  Diagnosis Date  . AAA (abdominal aortic aneurysm) ([place])   . COPD (chronic obstructive pulmonary disease) ([place])   . Coronary artery disease 2000   Status post CABG  . Diabetes mellitus    DM2  . Dyslipidemia   . Dyspnea   . History of kidney stones   . Hypertension   . Obesity   . Osteoarthritis   . Sleep apnea    USING CPAP  . Spinal stenosis   . SSS (sick sinus syndrome) ([place]) 2000   s/p PPM   Discharge Diagnoses:   Principal Problem:   OA (osteoarthritis) of hip  Estimated body mass index is 34.93 kg/m as calculated from the following:   Height as of this encounter: _0  (1.702 m).   Weight as of this encounter: 101.2 kg (223 lb).  Procedure(s) (LRB): LEFT TOTAL HIP ARTHROPLASTY ANTERIOR APPROACH (Left)   Consults: None  HPI: [name] is a 76 y.o. male who has advanced end-  stage arthritis of their Left  hip with progressively worsening pain and  dysfunction.The patient has failed nonoperative management and presents for  total hip arthroplasty.    Laboratory Data: Admission on 07/18/2017  Component Date Value Ref Range Status  . Glucose-Capillary 07/18/2017 109* 65 - 99 mg/dL Final  . Glucose-Capillary 07/18/2017 137* 65 - 99 mg/dL Final  . WBC 07/19/2017 9.6  4.0 - 10.5 K/uL Final  . RBC 07/19/2017 3.24* 4.22 - 5.81 MIL/uL Final  . Hemoglobin 07/19/2017 9.8* 13.0 - 17.0 g/dL Final  . HCT 07/19/2017 29.6* 39.0 - 52.0 % Final  . MCV 07/19/2017 91.4  78.0 - 100.0 fL Final  . MCH 07/19/2017 30.2  26.0 - 34.0 pg Final  . MCHC 07/19/2017 33.1  30.0 - 36.0 g/dL Final  . RDW 07/19/2017 14.3  11.5 - 15.5 % Final  . Platelets 07/19/2017 144* 150 - 400 K/uL Final  . Sodium 07/19/2017 137  135 - 145  mmol/L Final  . Potassium 07/19/2017 4.1  3.5 - 5.1 mmol/L Final  . Chloride 07/19/2017 107  101 - 111 mmol/L Final  . CO2 07/19/2017 24  22 - 32 mmol/L Final  . Glucose, Bld 07/19/2017 221* 65 - 99 mg/dL Final  . BUN 07/19/2017 30* 6 - 20 mg/dL Final  . Creatinine, Ser 07/19/2017 1.38* 0.61 - 1.24 mg/dL Final  . Calcium 07/19/2017 8.7* 8.9 - 10.3 mg/dL Final  . GFR calc non Af Amer 07/19/2017 48* >60 mL/min Final  . GFR calc Af Amer 07/19/2017 56* >60 mL/min Final   Comment: (NOTE) The eGFR has been calculated using the CKD EPI equation. This calculation has not been validated in all clinical situations. eGFR's persistently <60 mL/min signify possible Chronic Kidney Disease.   . Anion gap 07/19/2017 6  5 - 15 Final  . Glucose-Capillary 07/18/2017 212* 65 - 99 mg/dL Final  . Glucose-Capillary 07/18/2017 290* 65 - 99 mg/dL Final  . Glucose-Capillary 07/19/2017 179* 65 - 99 mg/dL Final  Hospital Outpatient Visit on 07/11/2017  Component Date Value Ref Range Status  . aPTT 07/11/2017 32  24 - 36 seconds Final  . WBC 07/11/2017 5.6  4.0 - 10.5 K/uL Final  . RBC 07/11/2017 4.29  4.22 - 5.81 MIL/uL Final  .  Hemoglobin 07/11/2017 13.0  13.0 - 17.0 g/dL Final  . HCT 07/11/2017 39.5  39.0 - 52.0 % Final  . MCV 07/11/2017 92.1  78.0 - 100.0 fL Final  . MCH 07/11/2017 30.3  26.0 - 34.0 pg Final  . MCHC 07/11/2017 32.9  30.0 - 36.0 g/dL Final  . RDW 07/11/2017 14.6  11.5 - 15.5 % Final  . Platelets 07/11/2017 167  150 - 400 K/uL Final  . Sodium 07/11/2017 142  135 - 145 mmol/L Final  . Potassium 07/11/2017 4.8  3.5 - 5.1 mmol/L Final  . Chloride 07/11/2017 109  101 - 111 mmol/L Final  . CO2 07/11/2017 26  22 - 32 mmol/L Final  . Glucose, Bld 07/11/2017 104* 65 - 99 mg/dL Final  . BUN 07/11/2017 41* 6 - 20 mg/dL Final  . Creatinine, Ser 07/11/2017 1.89* 0.61 - 1.24 mg/dL Final  . Calcium 07/11/2017 9.6  8.9 - 10.3 mg/dL Final  . Total Protein 07/11/2017 7.1  6.5 - 8.1 g/dL Final  .  Albumin 07/11/2017 3.7  3.5 - 5.0 g/dL Final  . AST 07/11/2017 26  15 - 41 U/L Final  . ALT 07/11/2017 21  17 - 63 U/L Final  . Alkaline Phosphatase 07/11/2017 135* 38 - 126 U/L Final  . Total Bilirubin 07/11/2017 1.0  0.3 - 1.2 mg/dL Final  . GFR calc non Af Amer 07/11/2017 33* >60 mL/min Final  . GFR calc Af Amer 07/11/2017 38* >60 mL/min Final   Comment: (NOTE) The eGFR has been calculated using the CKD EPI equation. This calculation has not been validated in all clinical situations. eGFR's persistently <60 mL/min signify possible Chronic Kidney Disease.   . Anion gap 07/11/2017 7  5 - 15 Final  . Prothrombin Time 07/11/2017 12.9  11.4 - 15.2 seconds Final  . INR 07/11/2017 0.98   Final  . ABO/RH(D) 07/11/2017 O POS   Final  . Antibody Screen 07/11/2017 NEG   Final  . Sample Expiration 07/11/2017 07/21/2017   Final  . Extend sample reason 07/11/2017 NO TRANSFUSIONS OR PREGNANCY IN THE PAST 3 MONTHS   Final  . Hgb A1c MFr Bld 07/11/2017 6.0* 4.8 - 5.6 % Final   Comment: (NOTE) Pre diabetes:          5.7%-6.4% Diabetes:              >6.4% Glycemic control for   <7.0% adults with diabetes   . Mean Plasma Glucose 07/11/2017 125.5  mg/dL Final   Performed at [facility] [address], [place], [place] [address]  . MRSA, PCR 07/11/2017 NEGATIVE  NEGATIVE Final  . Staphylococcus aureus 07/11/2017 POSITIVE* NEGATIVE Final   Comment: (NOTE) The Xpert SA Assay (FDA approved for NASAL specimens in patients 45 years of age and older), is one component of a comprehensive surveillance program. It is not intended to diagnose infection nor to guide or monitor treatment.   . Glucose-Capillary 07/11/2017 126* 65 - 99 mg/dL Final  Hospital Outpatient Visit on 06/07/2017  Component Date Value Ref Range Status  . Rest HR 06/07/2017 67  bpm Final  . Rest BP 06/07/2017 156/88  mmHg Final  . Peak HR 06/07/2017 75  bpm Final  . Peak BP 06/07/2017 156/88  mmHg Final  . SSS 06/07/2017 3    Final  . SRS 06/07/2017 2   Final  . SDS 06/07/2017 1   Final  . LHR 06/07/2017 0.41   Final  . TID 06/07/2017 0.91  Final  . LV sys vol 06/07/2017 52  mL Final  . LV dias vol 06/07/2017 109  62 - 150 mL Final  Office Visit on 05/28/2017  Component Date Value Ref Range Status  . Date Time Interrogation Session 05/28/2017 [id number]   Final  . Pulse Generator Manufacturer 05/28/2017 MERM   Final  . Pulse Gen Model 05/28/2017 ADDRL1 Adapta   Final  . Pulse Gen Serial Number 05/28/2017 [id number]   Final  . Clinic Name 05/28/2017 [facility]   Final  . Implantable Pulse Generator Type 05/28/2017 Implantable Pulse Generator   Final  . Implantable Pulse Generator Implan* 05/28/2017 [id number]   Final  . Implantable Lead Manufacturer 05/28/2017 MERM   Final  . Implantable Lead Model 05/28/2017 4023 CapSure SP   Final  . Implantable Lead Serial Number 05/28/2017 [id number]   Final  . Implantable Lead Implant Date 05/28/2017 [id number]   Final  . Implantable Lead Location Detail 1 05/28/2017 APEX   Final  . Implantable Lead Special Function 05/28/2017 Diagnosis:  Profound bradycardia/heart block   Final  . Implantable Lead Location 05/28/2017 [id number]   Final  . Implantable Lead Manufacturer 05/28/2017 PACE   Final  . Implantable Lead Model 05/28/2017 1388T Tendril DX   Final  . Implantable Lead Serial Number 05/28/2017 [id number]   Final  . Implantable Lead Implant Date 05/28/2017 [id number]   Final  . Implantable Lead Location Detail 1 05/28/2017 APPENDAGE   Final  . Implantable Lead Special Function 05/28/2017 Diagnosis:  Profound bradycardia/heart block   Final  . Implantable Lead Location 05/28/2017 [id number]   Final  . Lead Channel Setting Sensing Sensi* 05/28/2017 2.80  mV Final  . Lead Channel Setting Pacing Amplit* 05/28/2017 2.000  V Final  . Lead Channel Setting Pacing Pulse * 05/28/2017 0.40  ms Final  . Lead Channel Setting Pacing Amplit* 05/28/2017 2.500  V Final  . Lead  Channel Impedance Value 05/28/2017 359  ohm Final  . Lead Channel Sensing Intrinsic Amp* 05/28/2017 2.00  mV Final  . Lead Channel Pacing Threshold Ampl* 05/28/2017 1.000  V Final  . Lead Channel Pacing Threshold Puls* 05/28/2017 0.40  ms Final  . Lead Channel Pacing Threshold Ampl* 05/28/2017 1.000  V Final  . Lead Channel Pacing Threshold Puls* 05/28/2017 0.40  ms Final  . Lead Channel Impedance Value 05/28/2017 449  ohm Final  . Lead Channel Sensing Intrinsic Amp* 05/28/2017 8.00  mV Final  . Lead Channel Pacing Threshold Ampl* 05/28/2017 0.500  V Final  . Lead Channel Pacing Threshold Puls* 05/28/2017 0.40  ms Final  . Lead Channel Pacing Threshold Ampl* 05/28/2017 0.625  V Final  . Lead Channel Pacing Threshold Puls* 05/28/2017 0.40  ms Final  . Battery Status 05/28/2017 OK   Final  . Battery Remaining Longevity 05/28/2017 131  mo Final  . Battery Voltage 05/28/2017 2.78  V Final  . Battery Impedance 05/28/2017 156  ohm Final  . Brady Statistic AP VP Percent 05/28/2017 0  % Final  . Brady Statistic AS VP Percent 05/28/2017 0  % Final  . Brady Statistic AP VS Percent 05/28/2017 61  % Final  . Brady Statistic AS VS Percent 05/28/2017 39  % Final  . Eval Rhythm 05/28/2017 AS,VS @ 68   Final     X-Rays:Dg Pelvis Portable  Result Date: 07/18/2017 CLINICAL DATA:  76 year old male status post left hip replacement. EXAM: PORTABLE PELVIS 1-2 VIEWS COMPARISON:  CT Abdomen and Pelvis 06/12/2011. FINDINGS: Portable AP view  at 1511 hours. Preexisting right hip arthroplasty. Left bipolar type hip arthroplasty hardware is new since 2012. Components appear intact and normally aligned in the AP plane. Postoperative drain in place. No unexpected osseous changes identified extensive calcified femoral artery atherosclerosis. IMPRESSION: 1. Bipolar type left hip arthroplasty in place with no adverse features. Preexisting right hip arthroplasty. 2. Advanced bilateral femoral artery calcified  atherosclerosis. Electronically Signed   By: Genevie Ann M.D.   On: 07/18/2017 15:40   Dg Hip Operative Unilat With Pelvis Left  Result Date: 07/18/2017 CLINICAL DATA:  Hip replacement EXAM: OPERATIVE LEFT HIP (WITH PELVIS IF PERFORMED) 1 VIEWS TECHNIQUE: Fluoroscopic spot image(s) were submitted for interpretation post-operatively. COMPARISON:  None. FINDINGS: Fluoroscopy for total hip arthroplasty. No evidence of fracture or dislocation. Soft tissue gas in the operative region. IMPRESSION: Fluoroscopy for hip arthroplasty. Electronically Signed   By: Monte Fantasia M.D.   On: 07/18/2017 14:20    EKG: Orders placed or performed in visit on 05/28/17  . EKG 12-Lead     Hospital Course: Patient was admitted to [facility] and taken to the OR and underwent the above state procedure without complications.  Patient tolerated the procedure well and was later transferred to the recovery room and then to the orthopaedic floor for postoperative care.  They were given PO and IV analgesics for pain control following their surgery.  They were given 24 hours of postoperative antibiotics of  Anti-infectives (From admission, onward)   Start     Dose/Rate Route Frequency Ordered Stop   07/18/17 1900  ceFAZolin (ANCEF) IVPB 2g/100 mL premix     2 g 200 mL/hr over 30 Minutes Intravenous Every 6 hours 07/18/17 1604 07/19/17 0035   07/18/17 1138  ceFAZolin (ANCEF) 2-4 GM/100ML-% IVPB    Comments:  Bridget Hartshorn   : cabinet override      07/18/17 1138 07/18/17 1247   07/18/17 1124  ceFAZolin (ANCEF) IVPB 2g/100 mL premix     2 g 200 mL/hr over 30 Minutes Intravenous On call to O.R. 07/18/17 1124 07/18/17 1253     and started on DVT prophylaxis in the form of Xarelto.   PT and OT were ordered for total hip protocol.  The patient was allowed to be WBAT with therapy. Discharge planning was consulted to help with postop disposition and equipment needs.  Patient had a good night on the evening of surgery.   They started to get up OOB with therapy on day one.  Hemovac drain was pulled without difficulty. Dressing was checked and was clean and dry.  Patient was seen in rounds and was ready to go home folloiwng therapy goals.  Discharge home with home health Diet - Cardiac diet and Diabetic diet Follow up - in 2 weeks Activity - WBAT Disposition - Home Condition Upon Discharge - pending therapy D/C Meds - See DC Summary DVT Prophylaxis - Xarelto   Discharge Instructions    Call MD / Call 911   Complete by:  As directed    If you experience chest pain or shortness of breath, CALL 911 and be transported to the hospital emergency room.  If you develope a fever above 101 F, pus (white drainage) or increased drainage or redness at the wound, or calf pain, call your surgeon's office.   Change dressing   Complete by:  As directed    You may change your dressing dressing daily with sterile 4 x 4 inch gauze dressing and paper tape.  Do  not submerge the incision under water.   Constipation Prevention   Complete by:  As directed    Drink plenty of fluids.  Prune juice may be helpful.  You may use a stool softener, such as Colace (over the counter) 100 mg twice a day.  Use MiraLax (over the counter) for constipation as needed.   Diet - low sodium heart healthy   Complete by:  As directed    Discharge instructions   Complete by:  As directed    Take Xarelto for two and a half more weeks, then discontinue Xarelto. Once the patient has completed the Xarelto, they may resume the 81 mg Aspirin.   Pick up stool softner and laxative for home use following surgery while on pain medications. Do not submerge incision under water. Please use good hand washing techniques while changing dressing each day. May shower starting three days after surgery. Please use a clean towel to pat the incision dry following showers. Continue to use ice for pain and swelling after surgery. Do not use any lotions or creams on  the incision until instructed by your surgeon.  Wear both TED hose on both legs during the day every day for three weeks, but may remove the TED hose at night at home.  Postoperative Constipation Protocol  Constipation - defined medically as fewer than three stools per week and severe constipation as less than one stool per week.  One of the most common issues patients have following surgery is constipation.  Even if you have a regular bowel pattern at home, your normal regimen is likely to be disrupted due to multiple reasons following surgery.  Combination of anesthesia, postoperative narcotics, change in appetite and fluid intake all can affect your bowels.  In order to avoid complications following surgery, here are some recommendations in order to help you during your recovery period.  Colace (docusate) - Pick up an over-the-counter form of Colace or another stool softener and take twice a day as long as you are requiring postoperative pain medications.  Take with a full glass of water daily.  If you experience loose stools or diarrhea, hold the colace until you stool forms back up.  If your symptoms do not get better within 1 week or if they get worse, check with your doctor.  Dulcolax (bisacodyl) - Pick up over-the-counter and take as directed by the product packaging as needed to assist with the movement of your bowels.  Take with a full glass of water.  Use this product as needed if not relieved by Colace only.   MiraLax (polyethylene glycol) - Pick up over-the-counter to have on hand.  MiraLax is a solution that will increase the amount of water in your bowels to assist with bowel movements.  Take as directed and can mix with a glass of water, juice, soda, coffee, or tea.  Take if you go more than two days without a movement. Do not use MiraLax more than once per day. Call your doctor if you are still constipated or irregular after using this medication for 7 days in a row.  If you  continue to have problems with postoperative constipation, please contact the office for further assistance and recommendations.  If you experience "the worst abdominal pain ever" or develop nausea or vomiting, please contact the office immediatly for further recommendations for treatment.   Do not sit on low chairs, stoools or toilet seats, as it may be difficult to get up from low surfaces  Complete by:  As directed    Driving restrictions   Complete by:  As directed    No driving until released by the physician.   Increase activity slowly as tolerated   Complete by:  As directed    Lifting restrictions   Complete by:  As directed    No lifting until released by the physician.   Patient may shower   Complete by:  As directed    You may shower without a dressing once there is no drainage.  Do not wash over the wound.  If drainage remains, do not shower until drainage stops.   TED hose   Complete by:  As directed    Use stockings (TED hose) for 3 weeks on both leg(s).  You may remove them at night for sleeping.   Weight bearing as tolerated   Complete by:  As directed    Laterality:  left   Extremity:  Lower     Allergies as of 07/19/2017   No Known Allergies     Medication List    STOP taking these medications   aspirin EC 81 MG tablet   CoQ-10 200 MG Caps   diclofenac 75 MG EC tablet Commonly known as:  VOLTAREN   Fish Oil 1200 MG Caps   multivitamin tablet     TAKE these medications   atorvastatin 40 MG tablet Commonly known as:  LIPITOR Take 40 mg by mouth daily.   glimepiride 4 MG tablet Commonly known as:  AMARYL Take 4 mg by mouth daily.   losartan 100 MG tablet Commonly known as:  COZAAR Take 100 mg by mouth daily.   methocarbamol 500 MG tablet Commonly known as:  ROBAXIN Take 1 tablet (500 mg total) by mouth every 6 (six) hours as needed for muscle spasms.   metoprolol succinate 50 MG 24 hr tablet Commonly known as:  TOPROL-XL Take 50 mg by mouth  daily. Take with or immediately following a meal.   oxyCODONE 5 MG immediate release tablet Commonly known as:  Oxy IR/ROXICODONE Take 1-2 tablets (5-10 mg total) by mouth every 4 (four) hours as needed for moderate pain or severe pain.   pioglitazone 15 MG tablet Commonly known as:  ACTOS Take 15 mg by mouth daily.   rivaroxaban 10 MG Tabs tablet Commonly known as:  XARELTO Take 1 tablet (10 mg total) by mouth daily with breakfast. Take Xarelto for two and a half more weeks following discharge from the hospital, then discontinue Xarelto. Once the patient has completed the Xarelto, they may resume the 81 mg Aspirin.            Discharge Care Instructions  (From admission, onward)        Start     Ordered   07/19/17 0000  Weight bearing as tolerated    Question Answer Comment  Laterality left   Extremity Lower      07/19/17 0736   07/19/17 0000  Change dressing    Comments:  You may change your dressing dressing daily with sterile 4 x 4 inch gauze dressing and paper tape.  Do not submerge the incision under water.   07/19/17 0736     Follow-up Information    [name], MD. Schedule an appointment as soon as possible for a visit in 2 week(s).   Specialty:  Orthopedic Surgery Contact information: [address] [place] [address] [phone]           Signed: Dian Situ  Dara Lords, PA-C Orthopaedic Surgery 07/19/2017, 7:37 AM

## 2017-07-19 NOTE — Evaluation (Signed)
Physical Therapy Evaluation Patient Details Name: GEZA BERANEK MRN: [id number] DOB: 10-14-1940 Today's Date: 07/19/2017   History of Present Illness  Pt is a 76 year old male s/p L DA THA with hx of R THA, bil TKAs, back surgery, COPD, pacemaker  Clinical Impression  Pt is s/p THA resulting in the deficits listed below (see PT Problem List).  Pt will benefit from skilled PT to increase their independence and safety with mobility to allow discharge to the venue listed below.  Pt ambulated in hallway and performed LE exercises.  Pt provided with HEP handout.  Pt hopeful to d/c home later this afternoon.  Plan to practice steps prior to d/c.     Follow Up Recommendations DC plan and follow up therapy as arranged by surgeon    Equipment Recommendations  Rolling walker with 5" wheels    Recommendations for Other Services       Precautions / Restrictions Precautions Precautions: None Restrictions Weight Bearing Restrictions: No Other Position/Activity Restrictions: WBAT      Mobility  Bed Mobility               General bed mobility comments: pt up in recliner on arrival  Transfers Overall transfer level: Needs assistance Equipment used: Rolling walker (2 wheeled) Transfers: Sit to/from Stand Sit to Stand: Min guard         General transfer comment: verbal cues for UE and LE positioning  Ambulation/Gait Ambulation/Gait assistance: Min guard Ambulation Distance (Feet): 120 Feet Assistive device: Rolling walker (2 wheeled) Gait Pattern/deviations: Step-to pattern;Decreased stance time - left;Antalgic;Trunk flexed     General Gait Details: verbal cues for RW positioning and posture, pt with increased trunk flexion which is improved with cues however pt with hx of back surgery as well and states he has been walking "hunched over" due to his hip pain prior to surgery  Stairs            Wheelchair Mobility    Modified Rankin (Stroke Patients Only)        Balance                                             Pertinent Vitals/Pain Pain Assessment: 0-10 Pain Score: 3  Pain Location: L hip Pain Descriptors / Indicators: Aching;Sore Pain Intervention(s): Limited activity within patient's tolerance;Repositioned;Monitored during session;Ice applied    Home Living Family/patient expects to be discharged to:: Private residence Living Arrangements: Spouse/significant other   Type of Home: House Home Access: Stairs to enter Entrance Stairs-Rails: Can reach Chartered loss adjuster of Steps: 4 Home Layout: One level Home Equipment: Environmental consultant - 4 wheels      Prior Function Level of Independence: Independent with assistive device(s)         Comments: retired Barista        Extremity/Trunk Assessment        Lower Extremity Assessment Lower Extremity Assessment: LLE deficits/detail LLE Deficits / Details: anticipated post op hip weakness especially hip flexion and abduction, able to perform ankle pumps       Communication   Communication: No difficulties  Cognition Arousal/Alertness: Awake/alert Behavior During Therapy: WFL for tasks assessed/performed Overall Cognitive Status: Within Functional Limits for tasks assessed  General Comments      Exercises Total Joint Exercises Ankle Circles/Pumps: AROM;Both;10 reps Quad Sets: AROM;Both;10 reps Towel Squeeze: AROM;Both;10 reps Heel Slides: AAROM;Left;10 reps Hip ABduction/ADduction: AAROM;Left;10 reps Long Arc Quad: AROM;Left;10 reps;Seated Marching in Standing: AROM;Left;Seated;10 reps   Assessment/Plan    PT Assessment Patient needs continued PT services  PT Problem List Decreased strength;Decreased mobility;Decreased range of motion;Decreased knowledge of use of DME;Pain       PT Treatment Interventions Stair training;Gait training;Therapeutic  exercise;Patient/family education;DME instruction;Therapeutic activities;Functional mobility training    PT Goals (Current goals can be found in the Care Plan section)  Acute Rehab PT Goals PT Goal Formulation: With patient Time For Goal Achievement: 07/26/17 Potential to Achieve Goals: Good    Frequency 7X/week   Barriers to discharge        Co-evaluation               AM-PAC PT "6 Clicks" Daily Activity  Outcome Measure Difficulty turning over in bed (including adjusting bedclothes, sheets and blankets)?: A Little Difficulty moving from lying on back to sitting on the side of the bed? : A Little Difficulty sitting down on and standing up from a chair with arms (e.g., wheelchair, bedside commode, etc,.)?: A Little Help needed moving to and from a bed to chair (including a wheelchair)?: A Little Help needed walking in hospital room?: A Little Help needed climbing 3-5 steps with a railing? : A Little 6 Click Score: 18    End of Session   Activity Tolerance: Patient tolerated treatment well Patient left: in chair;with call bell/phone within reach   PT Visit Diagnosis: Other abnormalities of gait and mobility (R26.89)    Time: 0272-5366 PT Time Calculation (min) (ACUTE ONLY): 26 min   Charges:   PT Evaluation $PT Eval Low Complexity: 1 Low PT Treatments $Therapeutic Exercise: 8-22 mins   PT G Codes:       Carmelia Bake, PT, DPT 07/19/2017 Pager: [phone]   York Ram E 07/19/2017, 11:16 AM

## 2017-07-19 NOTE — Progress Notes (Signed)
Discharge planning, spoke with patient at beside. Chose [facility] for [facility] services, contacted [facility] for referral. Has RW and 3-n-1. [phone]

## 2017-07-19 NOTE — Progress Notes (Signed)
Physical Therapy Treatment Patient Details Name: [name] MRN: [id number] DOB: February 15, 1941 Today's Date: 07/19/2017    History of Present Illness Pt is a 76 year old male s/p L DA THA with hx of R THA, bil TKAs, back surgery, COPD, pacemaker    PT Comments    Pt ambulated in hallway and practiced safe stair technique.  Pt reports no further questions and feels ready for d/c home today.   Follow Up Recommendations  DC plan and follow up therapy as arranged by surgeon     Equipment Recommendations  Rolling walker with 5" wheels    Recommendations for Other Services       Precautions / Restrictions Precautions Precautions: None Restrictions Other Position/Activity Restrictions: WBAT    Mobility  Bed Mobility               General bed mobility comments: pt up in recliner on arrival  Transfers Overall transfer level: Needs assistance Equipment used: 4-wheeled walker Transfers: Sit to/from Stand Sit to Stand: Min guard         General transfer comment: verbal cues for UE and LE positioning, pt performs brakes on rollator (plans to use rollator at home so practiced - cautioned on 4 wheels and more unsteady however pt states he has used rollator after his other hip surgery)  Ambulation/Gait Ambulation/Gait assistance: Min guard Ambulation Distance (Feet): 200 Feet Assistive device: Rolling walker (2 wheeled) Gait Pattern/deviations: Decreased stance time - left;Antalgic;Trunk flexed;Step-through pattern     General Gait Details: verbal cues for posture, pt with increased trunk flexion which is improved with cues, required one seated rest break   Stairs Stairs: Yes   Stair Management: Step to pattern;Forwards;Two rails Number of Stairs: 2 General stair comments: verbal cues for sequence and safety, pt performed twice, reports understanding  Wheelchair Mobility    Modified Rankin (Stroke Patients Only)       Balance                                            Cognition Arousal/Alertness: Awake/alert Behavior During Therapy: WFL for tasks assessed/performed Overall Cognitive Status: Within Functional Limits for tasks assessed                                        Exercises      General Comments        Pertinent Vitals/Pain Pain Assessment: 0-10 Pain Score: 3  Pain Location: L hip Pain Descriptors / Indicators: Aching;Sore Pain Intervention(s): Monitored during session;Limited activity within patient's tolerance;Repositioned    Home Living                      Prior Function            PT Goals (current goals can now be found in the care plan section) Progress towards PT goals: Progressing toward goals    Frequency    7X/week      PT Plan Current plan remains appropriate    Co-evaluation              AM-PAC PT "6 Clicks" Daily Activity  Outcome Measure  Difficulty turning over in bed (including adjusting bedclothes, sheets and blankets)?: A Little Difficulty moving from lying on back to sitting on  the side of the bed? : A Little Difficulty sitting down on and standing up from a chair with arms (e.g., wheelchair, bedside commode, etc,.)?: A Little Help needed moving to and from a bed to chair (including a wheelchair)?: A Little Help needed walking in hospital room?: A Little Help needed climbing 3-5 steps with a railing? : A Little 6 Click Score: 18    End of Session   Activity Tolerance: Patient tolerated treatment well Patient left: in chair;with call bell/phone within reach   PT Visit Diagnosis: Other abnormalities of gait and mobility (R26.89)     Time: 1518-3437 PT Time Calculation (min) (ACUTE ONLY): 19 min  Charges:  $Gait Training: 8-22 mins                    G Codes:       Carmelia Bake, PT, DPT 07/19/2017 Pager: [phone]  York Ram E 07/19/2017, 4:05 PM

## 2017-07-20 DIAGNOSIS — Z951 Presence of aortocoronary bypass graft: Secondary | ICD-10-CM | POA: Diagnosis not present

## 2017-07-20 DIAGNOSIS — G4730 Sleep apnea, unspecified: Secondary | ICD-10-CM | POA: Diagnosis not present

## 2017-07-20 DIAGNOSIS — Z96642 Presence of left artificial hip joint: Secondary | ICD-10-CM | POA: Diagnosis not present

## 2017-07-20 DIAGNOSIS — Z950 Presence of cardiac pacemaker: Secondary | ICD-10-CM | POA: Diagnosis not present

## 2017-07-20 DIAGNOSIS — I2510 Atherosclerotic heart disease of native coronary artery without angina pectoris: Secondary | ICD-10-CM | POA: Diagnosis not present

## 2017-07-20 DIAGNOSIS — M1990 Unspecified osteoarthritis, unspecified site: Secondary | ICD-10-CM | POA: Diagnosis not present

## 2017-07-20 DIAGNOSIS — Z471 Aftercare following joint replacement surgery: Secondary | ICD-10-CM | POA: Diagnosis not present

## 2017-07-20 DIAGNOSIS — J449 Chronic obstructive pulmonary disease, unspecified: Secondary | ICD-10-CM | POA: Diagnosis not present

## 2017-07-20 DIAGNOSIS — E669 Obesity, unspecified: Secondary | ICD-10-CM | POA: Diagnosis not present

## 2017-07-20 DIAGNOSIS — Z96641 Presence of right artificial hip joint: Secondary | ICD-10-CM | POA: Diagnosis not present

## 2017-07-20 DIAGNOSIS — I10 Essential (primary) hypertension: Secondary | ICD-10-CM | POA: Diagnosis not present

## 2017-07-20 DIAGNOSIS — Z96653 Presence of artificial knee joint, bilateral: Secondary | ICD-10-CM | POA: Diagnosis not present

## 2017-07-20 DIAGNOSIS — E119 Type 2 diabetes mellitus without complications: Secondary | ICD-10-CM | POA: Diagnosis not present

## 2017-07-23 DIAGNOSIS — E669 Obesity, unspecified: Secondary | ICD-10-CM | POA: Diagnosis not present

## 2017-07-23 DIAGNOSIS — E119 Type 2 diabetes mellitus without complications: Secondary | ICD-10-CM | POA: Diagnosis not present

## 2017-07-23 DIAGNOSIS — Z96642 Presence of left artificial hip joint: Secondary | ICD-10-CM | POA: Diagnosis not present

## 2017-07-23 DIAGNOSIS — I10 Essential (primary) hypertension: Secondary | ICD-10-CM | POA: Diagnosis not present

## 2017-07-23 DIAGNOSIS — I2510 Atherosclerotic heart disease of native coronary artery without angina pectoris: Secondary | ICD-10-CM | POA: Diagnosis not present

## 2017-07-23 DIAGNOSIS — Z471 Aftercare following joint replacement surgery: Secondary | ICD-10-CM | POA: Diagnosis not present

## 2017-07-27 DIAGNOSIS — Z96642 Presence of left artificial hip joint: Secondary | ICD-10-CM | POA: Diagnosis not present

## 2017-07-27 DIAGNOSIS — E669 Obesity, unspecified: Secondary | ICD-10-CM | POA: Diagnosis not present

## 2017-07-27 DIAGNOSIS — I10 Essential (primary) hypertension: Secondary | ICD-10-CM | POA: Diagnosis not present

## 2017-07-27 DIAGNOSIS — E119 Type 2 diabetes mellitus without complications: Secondary | ICD-10-CM | POA: Diagnosis not present

## 2017-07-27 DIAGNOSIS — Z471 Aftercare following joint replacement surgery: Secondary | ICD-10-CM | POA: Diagnosis not present

## 2017-07-27 DIAGNOSIS — I2510 Atherosclerotic heart disease of native coronary artery without angina pectoris: Secondary | ICD-10-CM | POA: Diagnosis not present

## 2017-08-01 DIAGNOSIS — E669 Obesity, unspecified: Secondary | ICD-10-CM | POA: Diagnosis not present

## 2017-08-01 DIAGNOSIS — E119 Type 2 diabetes mellitus without complications: Secondary | ICD-10-CM | POA: Diagnosis not present

## 2017-08-01 DIAGNOSIS — I2510 Atherosclerotic heart disease of native coronary artery without angina pectoris: Secondary | ICD-10-CM | POA: Diagnosis not present

## 2017-08-01 DIAGNOSIS — Z471 Aftercare following joint replacement surgery: Secondary | ICD-10-CM | POA: Diagnosis not present

## 2017-08-01 DIAGNOSIS — I10 Essential (primary) hypertension: Secondary | ICD-10-CM | POA: Diagnosis not present

## 2017-08-01 DIAGNOSIS — Z96642 Presence of left artificial hip joint: Secondary | ICD-10-CM | POA: Diagnosis not present

## 2017-08-03 ENCOUNTER — Encounter: Payer: Medicare Other | Admitting: Internal Medicine

## 2017-08-03 DIAGNOSIS — E119 Type 2 diabetes mellitus without complications: Secondary | ICD-10-CM | POA: Diagnosis not present

## 2017-08-03 DIAGNOSIS — Z471 Aftercare following joint replacement surgery: Secondary | ICD-10-CM | POA: Diagnosis not present

## 2017-08-03 DIAGNOSIS — E669 Obesity, unspecified: Secondary | ICD-10-CM | POA: Diagnosis not present

## 2017-08-03 DIAGNOSIS — I10 Essential (primary) hypertension: Secondary | ICD-10-CM | POA: Diagnosis not present

## 2017-08-03 DIAGNOSIS — Z96642 Presence of left artificial hip joint: Secondary | ICD-10-CM | POA: Diagnosis not present

## 2017-08-03 DIAGNOSIS — I2510 Atherosclerotic heart disease of native coronary artery without angina pectoris: Secondary | ICD-10-CM | POA: Diagnosis not present

## 2017-08-06 DIAGNOSIS — Z96642 Presence of left artificial hip joint: Secondary | ICD-10-CM | POA: Diagnosis not present

## 2017-08-06 DIAGNOSIS — E119 Type 2 diabetes mellitus without complications: Secondary | ICD-10-CM | POA: Diagnosis not present

## 2017-08-06 DIAGNOSIS — I2510 Atherosclerotic heart disease of native coronary artery without angina pectoris: Secondary | ICD-10-CM | POA: Diagnosis not present

## 2017-08-06 DIAGNOSIS — Z471 Aftercare following joint replacement surgery: Secondary | ICD-10-CM | POA: Diagnosis not present

## 2017-08-06 DIAGNOSIS — E669 Obesity, unspecified: Secondary | ICD-10-CM | POA: Diagnosis not present

## 2017-08-06 DIAGNOSIS — I10 Essential (primary) hypertension: Secondary | ICD-10-CM | POA: Diagnosis not present

## 2017-08-09 DIAGNOSIS — I2510 Atherosclerotic heart disease of native coronary artery without angina pectoris: Secondary | ICD-10-CM | POA: Diagnosis not present

## 2017-08-09 DIAGNOSIS — I10 Essential (primary) hypertension: Secondary | ICD-10-CM | POA: Diagnosis not present

## 2017-08-09 DIAGNOSIS — E669 Obesity, unspecified: Secondary | ICD-10-CM | POA: Diagnosis not present

## 2017-08-09 DIAGNOSIS — Z471 Aftercare following joint replacement surgery: Secondary | ICD-10-CM | POA: Diagnosis not present

## 2017-08-09 DIAGNOSIS — Z96642 Presence of left artificial hip joint: Secondary | ICD-10-CM | POA: Diagnosis not present

## 2017-08-09 DIAGNOSIS — E119 Type 2 diabetes mellitus without complications: Secondary | ICD-10-CM | POA: Diagnosis not present

## 2017-08-13 ENCOUNTER — Encounter: Payer: Medicare Other | Admitting: *Deleted

## 2017-08-13 DIAGNOSIS — E669 Obesity, unspecified: Secondary | ICD-10-CM | POA: Diagnosis not present

## 2017-08-13 DIAGNOSIS — I10 Essential (primary) hypertension: Secondary | ICD-10-CM | POA: Diagnosis not present

## 2017-08-13 DIAGNOSIS — Z471 Aftercare following joint replacement surgery: Secondary | ICD-10-CM | POA: Diagnosis not present

## 2017-08-13 DIAGNOSIS — I2510 Atherosclerotic heart disease of native coronary artery without angina pectoris: Secondary | ICD-10-CM | POA: Diagnosis not present

## 2017-08-13 DIAGNOSIS — E119 Type 2 diabetes mellitus without complications: Secondary | ICD-10-CM | POA: Diagnosis not present

## 2017-08-13 DIAGNOSIS — Z96642 Presence of left artificial hip joint: Secondary | ICD-10-CM | POA: Diagnosis not present

## 2017-08-16 ENCOUNTER — Encounter: Payer: Self-pay | Admitting: Cardiology

## 2017-08-16 DIAGNOSIS — E669 Obesity, unspecified: Secondary | ICD-10-CM | POA: Diagnosis not present

## 2017-08-16 DIAGNOSIS — I10 Essential (primary) hypertension: Secondary | ICD-10-CM | POA: Diagnosis not present

## 2017-08-16 DIAGNOSIS — Z471 Aftercare following joint replacement surgery: Secondary | ICD-10-CM | POA: Diagnosis not present

## 2017-08-16 DIAGNOSIS — I2510 Atherosclerotic heart disease of native coronary artery without angina pectoris: Secondary | ICD-10-CM | POA: Diagnosis not present

## 2017-08-16 DIAGNOSIS — Z96642 Presence of left artificial hip joint: Secondary | ICD-10-CM | POA: Diagnosis not present

## 2017-08-16 DIAGNOSIS — E119 Type 2 diabetes mellitus without complications: Secondary | ICD-10-CM | POA: Diagnosis not present

## 2017-08-21 ENCOUNTER — Ambulatory Visit (INDEPENDENT_AMBULATORY_CARE_PROVIDER_SITE_OTHER): Payer: Medicare Other | Admitting: *Deleted

## 2017-08-21 DIAGNOSIS — I495 Sick sinus syndrome: Secondary | ICD-10-CM

## 2017-08-22 ENCOUNTER — Encounter: Payer: Self-pay | Admitting: Cardiology

## 2017-08-22 NOTE — Progress Notes (Signed)
Remote pacemaker transmission.   

## 2017-08-28 LAB — CUP PACEART REMOTE DEVICE CHECK
Battery Impedance: 179 Ohm
Battery Remaining Longevity: 130 mo
Battery Voltage: 2.78 V
Brady Statistic AP VP Percent: 0 %
Brady Statistic AS VP Percent: 0 %
Date Time Interrogation Session: 20190122212701
Implantable Lead Model: 4023
Implantable Pulse Generator Implant Date: 20150831
Lead Channel Impedance Value: 342 Ohm
Lead Channel Pacing Threshold Amplitude: 0.875 V
Lead Channel Setting Pacing Amplitude: 2 V
Lead Channel Setting Pacing Amplitude: 2.5 V
MDC IDC LEAD IMPLANT DT: 20000419
MDC IDC LEAD IMPLANT DT: 20000419
MDC IDC LEAD LOCATION: 753859
MDC IDC LEAD LOCATION: 753860
MDC IDC MSMT LEADCHNL RA PACING THRESHOLD PULSEWIDTH: 0.4 ms
MDC IDC MSMT LEADCHNL RV IMPEDANCE VALUE: 435 Ohm
MDC IDC MSMT LEADCHNL RV PACING THRESHOLD AMPLITUDE: 0.5 V
MDC IDC MSMT LEADCHNL RV PACING THRESHOLD PULSEWIDTH: 0.4 ms
MDC IDC SET LEADCHNL RV PACING PULSEWIDTH: 0.4 ms
MDC IDC SET LEADCHNL RV SENSING SENSITIVITY: 2 mV
MDC IDC STAT BRADY AP VS PERCENT: 43 %
MDC IDC STAT BRADY AS VS PERCENT: 57 %

## 2017-09-04 DIAGNOSIS — Z96642 Presence of left artificial hip joint: Secondary | ICD-10-CM | POA: Diagnosis not present

## 2017-09-04 DIAGNOSIS — Z471 Aftercare following joint replacement surgery: Secondary | ICD-10-CM | POA: Diagnosis not present

## 2017-09-25 DIAGNOSIS — Z6839 Body mass index (BMI) 39.0-39.9, adult: Secondary | ICD-10-CM | POA: Diagnosis not present

## 2017-09-25 DIAGNOSIS — Z1339 Encounter for screening examination for other mental health and behavioral disorders: Secondary | ICD-10-CM | POA: Diagnosis not present

## 2017-09-25 DIAGNOSIS — Z125 Encounter for screening for malignant neoplasm of prostate: Secondary | ICD-10-CM | POA: Diagnosis not present

## 2017-09-25 DIAGNOSIS — Z1331 Encounter for screening for depression: Secondary | ICD-10-CM | POA: Diagnosis not present

## 2017-09-25 DIAGNOSIS — Z0000 Encounter for general adult medical examination without abnormal findings: Secondary | ICD-10-CM | POA: Diagnosis not present

## 2017-09-25 DIAGNOSIS — I10 Essential (primary) hypertension: Secondary | ICD-10-CM | POA: Diagnosis not present

## 2017-09-25 DIAGNOSIS — R5383 Other fatigue: Secondary | ICD-10-CM | POA: Diagnosis not present

## 2017-09-25 DIAGNOSIS — E1122 Type 2 diabetes mellitus with diabetic chronic kidney disease: Secondary | ICD-10-CM | POA: Diagnosis not present

## 2017-09-25 DIAGNOSIS — Z299 Encounter for prophylactic measures, unspecified: Secondary | ICD-10-CM | POA: Diagnosis not present

## 2017-09-25 DIAGNOSIS — E1165 Type 2 diabetes mellitus with hyperglycemia: Secondary | ICD-10-CM | POA: Diagnosis not present

## 2017-09-25 DIAGNOSIS — Z7189 Other specified counseling: Secondary | ICD-10-CM | POA: Diagnosis not present

## 2017-09-25 DIAGNOSIS — Z1211 Encounter for screening for malignant neoplasm of colon: Secondary | ICD-10-CM | POA: Diagnosis not present

## 2017-09-25 DIAGNOSIS — Z79899 Other long term (current) drug therapy: Secondary | ICD-10-CM | POA: Diagnosis not present

## 2017-09-27 DIAGNOSIS — Z713 Dietary counseling and surveillance: Secondary | ICD-10-CM | POA: Diagnosis not present

## 2017-09-27 DIAGNOSIS — Z6839 Body mass index (BMI) 39.0-39.9, adult: Secondary | ICD-10-CM | POA: Diagnosis not present

## 2017-09-27 DIAGNOSIS — Z299 Encounter for prophylactic measures, unspecified: Secondary | ICD-10-CM | POA: Diagnosis not present

## 2017-09-27 DIAGNOSIS — E7800 Pure hypercholesterolemia, unspecified: Secondary | ICD-10-CM | POA: Diagnosis not present

## 2017-09-27 DIAGNOSIS — R972 Elevated prostate specific antigen [PSA]: Secondary | ICD-10-CM | POA: Diagnosis not present

## 2017-10-22 DIAGNOSIS — R972 Elevated prostate specific antigen [PSA]: Secondary | ICD-10-CM | POA: Diagnosis not present

## 2017-11-20 ENCOUNTER — Ambulatory Visit: Payer: Medicare Other | Admitting: *Deleted

## 2017-11-20 ENCOUNTER — Telehealth: Payer: Self-pay | Admitting: Cardiology

## 2017-11-20 NOTE — Telephone Encounter (Signed)
Spoke with pt and reminded pt of remote transmission that is due today. Pt verbalized understanding.   

## 2017-11-21 ENCOUNTER — Encounter: Payer: Self-pay | Admitting: Cardiology

## 2017-11-22 ENCOUNTER — Ambulatory Visit (INDEPENDENT_AMBULATORY_CARE_PROVIDER_SITE_OTHER): Payer: Medicare Other | Admitting: *Deleted

## 2017-11-22 DIAGNOSIS — I495 Sick sinus syndrome: Secondary | ICD-10-CM

## 2017-12-04 DIAGNOSIS — Z299 Encounter for prophylactic measures, unspecified: Secondary | ICD-10-CM | POA: Diagnosis not present

## 2017-12-04 DIAGNOSIS — E1122 Type 2 diabetes mellitus with diabetic chronic kidney disease: Secondary | ICD-10-CM | POA: Diagnosis not present

## 2017-12-04 DIAGNOSIS — Z87891 Personal history of nicotine dependence: Secondary | ICD-10-CM | POA: Diagnosis not present

## 2017-12-04 DIAGNOSIS — Z6838 Body mass index (BMI) 38.0-38.9, adult: Secondary | ICD-10-CM | POA: Diagnosis not present

## 2017-12-04 DIAGNOSIS — E1142 Type 2 diabetes mellitus with diabetic polyneuropathy: Secondary | ICD-10-CM | POA: Diagnosis not present

## 2017-12-04 DIAGNOSIS — I10 Essential (primary) hypertension: Secondary | ICD-10-CM | POA: Diagnosis not present

## 2017-12-04 DIAGNOSIS — E1165 Type 2 diabetes mellitus with hyperglycemia: Secondary | ICD-10-CM | POA: Diagnosis not present

## 2017-12-04 DIAGNOSIS — N183 Chronic kidney disease, stage 3 (moderate): Secondary | ICD-10-CM | POA: Diagnosis not present

## 2017-12-11 DIAGNOSIS — Z6836 Body mass index (BMI) 36.0-36.9, adult: Secondary | ICD-10-CM | POA: Diagnosis not present

## 2017-12-11 DIAGNOSIS — M4306 Spondylolysis, lumbar region: Secondary | ICD-10-CM | POA: Diagnosis not present

## 2017-12-14 ENCOUNTER — Encounter: Payer: Self-pay | Admitting: Cardiology

## 2017-12-14 ENCOUNTER — Ambulatory Visit (INDEPENDENT_AMBULATORY_CARE_PROVIDER_SITE_OTHER): Payer: Medicare Other | Admitting: Urology

## 2017-12-14 DIAGNOSIS — R972 Elevated prostate specific antigen [PSA]: Secondary | ICD-10-CM

## 2017-12-14 NOTE — Progress Notes (Signed)
Remote pacemaker transmission.   

## 2017-12-18 LAB — CUP PACEART REMOTE DEVICE CHECK
Battery Impedance: 179 Ohm
Battery Voltage: 2.78 V
Brady Statistic AP VS Percent: 51 %
Brady Statistic AS VS Percent: 49 %
Date Time Interrogation Session: 20190425115453
Implantable Lead Implant Date: 20000419
Implantable Lead Location: 753859
Implantable Lead Location: 753860
Implantable Lead Model: 4023
Lead Channel Impedance Value: 427 Ohm
Lead Channel Pacing Threshold Amplitude: 0.5 V
Lead Channel Pacing Threshold Pulse Width: 0.4 ms
Lead Channel Setting Pacing Amplitude: 2.5 V
Lead Channel Setting Sensing Sensitivity: 2 mV
MDC IDC LEAD IMPLANT DT: 20000419
MDC IDC MSMT BATTERY REMAINING LONGEVITY: 130 mo
MDC IDC MSMT LEADCHNL RA IMPEDANCE VALUE: 364 Ohm
MDC IDC MSMT LEADCHNL RA PACING THRESHOLD AMPLITUDE: 0.875 V
MDC IDC MSMT LEADCHNL RA PACING THRESHOLD PULSEWIDTH: 0.4 ms
MDC IDC PG IMPLANT DT: 20150831
MDC IDC SET LEADCHNL RA PACING AMPLITUDE: 2 V
MDC IDC SET LEADCHNL RV PACING PULSEWIDTH: 0.4 ms
MDC IDC STAT BRADY AP VP PERCENT: 0 %
MDC IDC STAT BRADY AS VP PERCENT: 0 %

## 2017-12-19 ENCOUNTER — Other Ambulatory Visit: Payer: Self-pay | Admitting: Urology

## 2017-12-19 DIAGNOSIS — R972 Elevated prostate specific antigen [PSA]: Secondary | ICD-10-CM

## 2018-01-01 DIAGNOSIS — R972 Elevated prostate specific antigen [PSA]: Secondary | ICD-10-CM | POA: Diagnosis not present

## 2018-01-01 DIAGNOSIS — Z6839 Body mass index (BMI) 39.0-39.9, adult: Secondary | ICD-10-CM | POA: Diagnosis not present

## 2018-01-01 DIAGNOSIS — I10 Essential (primary) hypertension: Secondary | ICD-10-CM | POA: Diagnosis not present

## 2018-01-01 DIAGNOSIS — Z299 Encounter for prophylactic measures, unspecified: Secondary | ICD-10-CM | POA: Diagnosis not present

## 2018-01-01 DIAGNOSIS — E1165 Type 2 diabetes mellitus with hyperglycemia: Secondary | ICD-10-CM | POA: Diagnosis not present

## 2018-01-01 DIAGNOSIS — E1122 Type 2 diabetes mellitus with diabetic chronic kidney disease: Secondary | ICD-10-CM | POA: Diagnosis not present

## 2018-01-01 DIAGNOSIS — I739 Peripheral vascular disease, unspecified: Secondary | ICD-10-CM | POA: Diagnosis not present

## 2018-01-09 ENCOUNTER — Ambulatory Visit (HOSPITAL_COMMUNITY)
Admission: RE | Admit: 2018-01-09 | Discharge: 2018-01-09 | Disposition: A | Discharge status: Home / Self Care | Payer: Medicare Other | Source: Ambulatory Visit | Attending: Urology | Admitting: Urology

## 2018-01-09 DIAGNOSIS — C61 Malignant neoplasm of prostate: Secondary | ICD-10-CM | POA: Insufficient documentation

## 2018-01-09 DIAGNOSIS — R351 Nocturia: Secondary | ICD-10-CM | POA: Insufficient documentation

## 2018-01-09 DIAGNOSIS — R972 Elevated prostate specific antigen [PSA]: Secondary | ICD-10-CM | POA: Diagnosis not present

## 2018-01-09 DIAGNOSIS — D075 Carcinoma in situ of prostate: Secondary | ICD-10-CM | POA: Diagnosis not present

## 2018-01-09 HISTORY — PX: TRANSRECTAL ULTRASOUND: SHX5146

## 2018-01-09 HISTORY — PX: BIOPSY PROSTATE: PRO28

## 2018-01-09 MED ORDER — LIDOCAINE HCL (PF) 2 % IJ SOLN
INTRAMUSCULAR | Status: AC
  Administered 2018-01-09: 10 mL
  Filled 2018-01-09: qty 10

## 2018-01-09 MED ORDER — LIDOCAINE HCL (PF) 2 % IJ SOLN
10.0000 mL | Freq: Once | INTRAMUSCULAR | Status: AC
  Administered 2018-01-09: 10 mL

## 2018-01-09 MED ORDER — GENTAMICIN SULFATE 40 MG/ML IJ SOLN
160.0000 mg | Freq: Once | INTRAMUSCULAR | Status: AC
  Administered 2018-01-09: 80 mg via INTRAMUSCULAR

## 2018-01-09 MED ORDER — GENTAMICIN SULFATE 40 MG/ML IJ SOLN
INTRAMUSCULAR | Status: AC
  Administered 2018-01-09: 80 mg via INTRAMUSCULAR
  Filled 2018-01-09: qty 2

## 2018-01-09 NOTE — Consent Form (Signed)
CLINICAL DATA:    Ultrasound was provided for use by the ordering physician, and a technical  charge was applied by the performing facility.  No radiologist  interpretation/professional services rendered.

## 2018-01-17 ENCOUNTER — Ambulatory Visit (INDEPENDENT_AMBULATORY_CARE_PROVIDER_SITE_OTHER): Payer: Medicare Other | Admitting: Family

## 2018-01-17 ENCOUNTER — Encounter: Payer: Self-pay | Admitting: Family

## 2018-01-17 ENCOUNTER — Ambulatory Visit (HOSPITAL_COMMUNITY)
Admission: RE | Admit: 2018-01-17 | Discharge: 2018-01-17 | Disposition: A | Discharge status: Home / Self Care | Payer: Medicare Other | Source: Ambulatory Visit | Attending: Family | Admitting: Family

## 2018-01-17 ENCOUNTER — Other Ambulatory Visit: Payer: Self-pay

## 2018-01-17 VITALS — BP 178/96 | HR 58 | Temp 97.9°F | Resp 18 | Ht 67.0 in | Wt 229.0 lb

## 2018-01-17 DIAGNOSIS — I714 Abdominal aortic aneurysm, without rupture, unspecified: Secondary | ICD-10-CM

## 2018-01-17 DIAGNOSIS — I872 Venous insufficiency (chronic) (peripheral): Secondary | ICD-10-CM | POA: Diagnosis not present

## 2018-01-17 DIAGNOSIS — R0989 Other specified symptoms and signs involving the circulatory and respiratory systems: Secondary | ICD-10-CM

## 2018-01-17 NOTE — Patient Instructions (Addendum)
Before your next abdominal ultrasound:  Take two Extra-Strength Gas-X capsules at bedtime the night before the test. Take another two Extra-Strength Gas-X capsules 3 hours before the test.  Avoid gas forming foods and beverages the day before the test.     To decrease swelling in your feet and legs: Elevate feet above slightly bent knees, feet above heart, overnight and 3-4 times per day for 20 minutes.     Abdominal Aortic Aneurysm Blood pumps away from the heart through tubes (blood vessels) called arteries. Aneurysms are weak or damaged places in the wall of an artery. It bulges out like a balloon. An abdominal aortic aneurysm happens in the main artery of the body (aorta). It can burst or tear, causing bleeding inside the body. This is an emergency. It needs treatment right away. What are the causes? The exact cause is unknown. Things that could cause this problem include:  Fat and other substances building up in the lining of a tube.  Swelling of the walls of a blood vessel.  Certain tissue diseases.  Belly (abdominal) trauma.  An infection in the main artery of the body.  What increases the risk? There are things that make it more likely for you to have an aneurysm. These include:  Being over the age of 77 years old.  Having high blood pressure (hypertension).  Being a male.  Being white.  Being very overweight (obese).  Having a family history of aneurysm.  Using tobacco products.  What are the signs or symptoms? Symptoms depend on the size of the aneurysm and how fast it grows. There may not be symptoms. If symptoms occur, they can include:  Pain (belly, side, lower back, or groin).  Feeling full after eating a small amount of food.  Feeling sick to your stomach (nauseous), throwing up (vomiting), or both.  Feeling a lump in your belly that feels like it is beating (pulsating).  Feeling like you will pass out (faint).  How is this treated?  Medicine  to control blood pressure and pain.  Imaging tests to see if the aneurysm gets bigger.  Surgery. How is this prevented? To lessen your chance of getting this condition:  Stop smoking. Stop chewing tobacco.  Limit or avoid alcohol.  Keep your blood pressure, blood sugar, and cholesterol within normal limits.  Eat less salt.  Eat foods low in saturated fats and cholesterol. These are found in animal and whole dairy products.  Eat more fiber. Fiber is found in whole grains, vegetables, and fruits.  Keep a healthy weight.  Stay active and exercise often.  This information is not intended to replace advice given to you by your health care provider. Make sure you discuss any questions you have with your health care provider. Document Released: 11/11/2012 Document Revised: 12/23/2015 Document Reviewed: 08/16/2012 Elsevier Interactive Patient Education  2017 [employer]     Chronic Venous Insufficiency Chronic venous insufficiency, also called venous stasis, is a condition that prevents blood from being pumped effectively through the veins in your legs. Blood may no longer be pumped effectively from the legs back to the heart. This condition can range from mild to severe. With proper treatment, you should be able to continue with an active life. What are the causes? Chronic venous insufficiency occurs when the vein walls become stretched, weakened, or damaged, or when valves within the vein are damaged. Some common causes of this include:  High blood pressure inside the veins (venous hypertension).  Increased blood  pressure in the leg veins from long periods of sitting or standing.  A blood clot that blocks blood flow in a vein (deep vein thrombosis, DVT).  Inflammation of a vein (phlebitis) that causes a blood clot to form.  Tumors in the pelvis that cause blood to back up.  What increases the risk? The following factors may make you more likely to develop this  condition:  Having a family history of this condition.  Obesity.  Pregnancy.  Living without enough physical activity or exercise (sedentary lifestyle).  Smoking.  Having a job that requires long periods of standing or sitting in one place.  Being a certain age. Women in their 40s and 63s and men in their 4s are more likely to develop this condition.  What are the signs or symptoms? Symptoms of this condition include:  Veins that are enlarged, bulging, or twisted (varicose veins).  Skin breakdown or ulcers.  Reddened or discolored skin on the front of the leg.  Brown, smooth, tight, and painful skin just above the ankle, usually on the inside of the leg (lipodermatosclerosis).  Swelling.  How is this diagnosed? This condition may be diagnosed based on:  Your medical history.  A physical exam.  Tests, such as: ? A procedure that creates an image of a blood vessel and nearby organs and provides information about blood flow through the blood vessel (duplex ultrasound). ? A procedure that tests blood flow (plethysmography). ? A procedure to look at the veins using X-ray and dye (venogram).  How is this treated? The goals of treatment are to help you return to an active life and to minimize pain or disability. Treatment depends on the severity of your condition, and it may include:  Wearing compression stockings. These can help relieve symptoms and help prevent your condition from getting worse. However, they do not cure the condition.  Sclerotherapy. This is a procedure involving an injection of a material that "dissolves" damaged veins.  Surgery. This may involve: ? Removing a diseased vein (vein stripping). ? Cutting off blood flow through the vein (laser ablation surgery). ? Repairing a valve.  Follow these instructions at home:  Wear compression stockings as told by your health care provider. These stockings help to prevent blood clots and reduce swelling in  your legs.  Take over-the-counter and prescription medicines only as told by your health care provider.  Stay active by exercising, walking, or doing different activities. Ask your health care provider what activities are safe for you and how much exercise you need.  Drink enough fluid to keep your urine clear or pale yellow.  Do not use any products that contain nicotine or tobacco, such as cigarettes and e-cigarettes. If you need help quitting, ask your health care provider.  Keep all follow-up visits as told by your health care provider. This is important. Contact a health care provider if:  You have redness, swelling, or more pain in the affected area.  You see a red streak or line that extends up or down from the affected area.  You have skin breakdown or a loss of skin in the affected area, even if the breakdown is small.  You get an injury in the affected area. Get help right away if:  You get an injury and an open wound in the affected area.  You have severe pain that does not get better with medicine.  You have sudden numbness or weakness in the foot or ankle below the affected area, or  you have trouble moving your foot or ankle.  You have a fever and you have worse or persistent symptoms.  You have chest pain.  You have shortness of breath. Summary  Chronic venous insufficiency, also called venous stasis, is a condition that prevents blood from being pumped effectively through the veins in your legs.  Chronic venous insufficiency occurs when the vein walls become stretched, weakened, or damaged, or when valves within the vein are damaged.  Treatment for this condition depends on how severe your condition is, and it may involve wearing compression stockings or having a procedure.  Make sure you stay active by exercising, walking, or doing different activities. Ask your health care provider what activities are safe for you and how much exercise you need. This  information is not intended to replace advice given to you by your health care provider. Make sure you discuss any questions you have with your health care provider. Document Released: 11/20/2006 Document Revised: 06/05/2016 Document Reviewed: 06/05/2016 Elsevier Interactive Patient Education  2017 [employer].

## 2018-01-17 NOTE — Progress Notes (Signed)
[facility] [place]   CC: Follow up Abdominal Aortic Aneurysm  History of Present Illness  [name] is a 77 y.o. (12-28-1940) male who presents with chief complaint: follow up for AAA.   The patient does not have back or abdominal pain.    Dr. Donnetta Hutching last evaluated pt on 01-16-17. At that time imaging of his aorta iliac system reveal no change compared to a CT scan from one year prior. His maximal aortic diameter wass 3.4 cm. 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 Recommend yearly ultrasound to rule out any enlargement. Dr. Donnetta Hutching reviewed symptoms of leaking aneurysm with patient knows report immediately to the emergency room should this occur.  He has osteoarthritis with the knee replacements making his ability to get around somewhat limited. Does have stable claudication. Denies any rest pain or tissue loss. He had 3 lumbar spine surgeries.  He uses a cane to walk.  He has had bilateral hip replacements.   His walking is limited by dyspnea, does not seem to claudicate before he becomes dyspneic at about 50 yards and has to stop.   He had right lower leg veins harvested for CABG in 2000.   The patient denies history of stroke or TIA symptoms.  He used to be a runner, ran 5 K's.    Diabetic: Yes, A1C was 6.0 on 07-11-17. Pt states his last A1C was 6.2 in June 2019 Tobacco use: former smoker, quit in 2000, smoked x 42 years   Past Medical History:  Diagnosis Date  . AAA (abdominal aortic aneurysm) ([place])   . COPD (chronic obstructive pulmonary disease) ([place])   . Coronary artery disease 2000   Status post CABG  . Diabetes mellitus    DM2  . Dyslipidemia   . Dyspnea   . History of kidney stones   . Hypertension   . Obesity   . Osteoarthritis   . Sleep apnea    USING CPAP  . Spinal stenosis   . SSS (sick sinus syndrome) ([place]) 2000   s/p PPM   Past Surgical History:   Procedure Laterality Date  . CORONARY ARTERY BYPASS GRAFT     2000  . JOINT REPLACEMENT     L hip Dr. Wynelle Link 07-18-17  . KNEE ARTHROPLASTY     BIL  . L5-S1 Gill decompressive laminectomy    . PACEMAKER INSERTION     MDT implanted for sick sinus syndrome  . PERMANENT PACEMAKER GENERATOR CHANGE N/A 03/30/2014   MDT Adapta L generator change by Dr Rayann Heman  . Right total hip arthroplasty.    [name] TOTAL HIP ARTHROPLASTY Left 07/18/2017   Procedure: LEFT TOTAL HIP ARTHROPLASTY ANTERIOR APPROACH;  Surgeon: [name], MD;  Location: [facility];  Service: Orthopedics;  Laterality: Left;   Social History Social History   Socioeconomic History  . Marital status: Married    Spouse name: Not on file  . Number of children: Not on file  . Years of education: Not on file  . Highest education level: Not on file  Occupational History  . Occupation: Marine scientist: OTHER    Comment: [employer]   Social Needs  . Financial resource strain: Not on file  . Food insecurity:    Worry: Not on file    Inability: Not on file  . Transportation needs:    Medical: Not on file  Non-medical: Not on file  Tobacco Use  . Smoking status: Former Smoker    Packs/day: 1.00    Years: 42.00    Pack years: 42.00    Types: Cigarettes    Start date: 01/28/1961    Last attempt to quit: 10/30/1998    Years since quitting: 19.2  . Smokeless tobacco: Never Used  Substance and Sexual Activity  . Alcohol use: No    Alcohol/week: 0.0 oz  . Drug use: No  . Sexual activity: Not Currently  Lifestyle  . Physical activity:    Days per week: Not on file    Minutes per session: Not on file  . Stress: Not on file  Relationships  . Social connections:    Talks on phone: Not on file    Gets together: Not on file    Attends religious service: Not on file    Active member of club or organization: Not on file    Attends meetings of clubs or organizations: Not on file    Relationship status: Not on file  .  Intimate partner violence:    Fear of current or ex partner: Not on file    Emotionally abused: Not on file    Physically abused: Not on file    Forced sexual activity: Not on file  Other Topics Concern  . Not on file  Social History Narrative   Lives with wife in a 2 story home.  They have 2 grown children.     Retired Software engineer.     Family History Family History  Problem Relation Age of Onset  . CAD Father   . Stroke Father        Deceased, 58  . Hypertension Mother   . Stroke Mother        Deceased, 82  . Heart disease Mother   . Stroke Brother        Deceased, 65  . Hypertension Sister        Deceased, 24  . Heart disease Sister   . Healthy Son   . Healthy Daughter     Current Outpatient Medications on File Prior to Visit  Medication Sig Dispense Refill  . aspirin EC 81 MG tablet Take 81 mg by mouth daily.    [name] atorvastatin (LIPITOR) 40 MG tablet Take 40 mg by mouth daily.    . Coenzyme Q10 (CO Q 10 PO) Take 200 mg by mouth daily.    . diclofenac (VOLTAREN) 75 MG EC tablet Take 75 mg by mouth 2 (two) times daily.    [name] glimepiride (AMARYL) 4 MG tablet Take 4 mg by mouth daily.     [name] losartan (COZAAR) 100 MG tablet Take 100 mg by mouth daily.    . metoprolol succinate (TOPROL-XL) 50 MG 24 hr tablet Take 50 mg by mouth daily. Take with or immediately following a meal.    . Multiple Vitamins-Minerals (MULTIVITAMIN ADULTS PO) Take by mouth daily.    . pioglitazone (ACTOS) 15 MG tablet Take 15 mg by mouth daily.     . methocarbamol (ROBAXIN) 500 MG tablet Take 1 tablet (500 mg total) by mouth every 6 (six) hours as needed for muscle spasms. (Patient not taking: Reported on 01/17/2018) 80 tablet 0  . oxyCODONE (OXY IR/ROXICODONE) 5 MG immediate release tablet Take 1-2 tablets (5-10 mg total) by mouth every 4 (four) hours as needed for moderate pain or severe pain. (Patient not taking: Reported on 01/17/2018) 60 tablet 0  . rivaroxaban (XARELTO)  10 MG TABS tablet Take 1 tablet  (10 mg total) by mouth daily with breakfast. Take Xarelto for two and a half more weeks following discharge from the hospital, then discontinue Xarelto. Once the patient has completed the Xarelto, they may resume the 81 mg Aspirin. (Patient not taking: Reported on 01/17/2018) 20 tablet 0   No current facility-administered medications on file prior to visit.    Allergies  Allergen Reactions  . Other     ROS: See HPI for pertinent positives and negatives.  Physical Examination  Vitals:   01/17/18 0942 01/17/18 0951  BP: (!) 196/97 (!) 178/96  Pulse: 68 (!) 58  Resp: 18   Temp: 97.9 F (36.6 C)   TempSrc: Oral   SpO2: 98%   Weight: 229 lb (103.9 kg)   Height: 5\' 7"  (1.702 m)    Body mass index is 35.87 kg/m.  General: A&O x 3, WD, obese male. Gait: slow, steady, using cane HEENT: Grossly intact and WNL.  Pulmonary: Sym exp, respirations are non labored, fair air movement in all fields, + rales in bilateral bases, no rhonchi or wheezing. Cardiac: Regular rhythm and rate, no detected murmur. Pacemaker palpated left upper chest.   Carotid Bruits Right Left   Negative Negative   Adominal aortic pulse is not palpable Radial pulses are 2+ palpable                           VASCULAR EXAM:                                                                                                         LE Pulses Right Left       FEMORAL  not palpable  faintly palpable        POPLITEAL  not palpable   not palpable       POSTERIOR TIBIAL  not palpable   not palpable        DORSALIS PEDIS      ANTERIOR TIBIAL not palpable  not palpable     Gastrointestinal: soft, NTND, -G/R, - HSM, - masses palpated, - CVAT B. Musculoskeletal: M/S 5/5 throughout, Extremities without ischemic changes. Skin: No ulcers, no cellulitis. Scars on right lower leg from vein harvest for CABG. Hemosiderin deposits in both lower legs in gaiter fashion. 1+ bilateral pretibial pitting edema.  Neurologic: CN 2-12  intact, Pain and light touch intact in extremities are intact except slightly diminished sensation in feet, Motor exam as listed above. Psychiatric: Normal thought content, mood appropriate to clinical situation.    DATA  AAA Duplex (01/17/2018):  Previous size: 3.4 cm (Date: 01-16-17), Left CIA: 2.6 cm, Right CIA: 1.4 cm. >50% stenosis of left CIA.   Current size:  3.8 cm (Date: 01-17-18); Right CIA: 1.7 cm; Left CIA: 2.7 cm. Limited visualization due to bowel gas and body habitus.   Medical Decision Making  The patient is a 77 y.o. male who presents with asymptomatic AAA with a slight increase in size, based on limited visualization, AAA  at 3.8 cm, left CIA at 2.7 cm today.  I advised pt to call his PCP's office today and ask advice re his uncontrolled hypertension. He denies headache, states he does have occasional light headedness, he states baseline for him is dyspnea with activity, this is not worse; he denies chest pain.   Pedal pulses are not palpable. Right femoral pulse is not palpable, left is faintly palpable. No signs of ischemia in his feet or legs.   Mild chronic venous insufficiency: elevation of feet and knee high compression hose.    Based on this patient's exam and diagnostic studies, the patient willl follow up in 6 months with the following studies: aortoiliac duplex and ABI's.  Consideration for repair of AAA would be made when the size is 5.0 cm, growth > 1 cm/yr, and symptomatic status.  Consideration for repair of common iliac artery aneurysm would be made when tie size id > 3 cm.         The patient was given information about AAA including signs, symptoms, treatment, and how to minimize the risk of enlargement and rupture of aneurysms.    I emphasized the importance of maximal medical management including strict control of blood pressure, blood glucose, and lipid levels, antiplatelet agents, obtaining regular exercise, and continued  cessation of smoking.   The  patient was advised to call 911 should the patient experience sudden onset abdominal or back pain.   Thank you for allowing [place] to participate in this patient's care.  Clemon Chambers, RN, MSN, FNP-C [facility] [place] Office: [facility] Physician: Oneida Alar  01/17/2018, 10:02 AM

## 2018-01-17 NOTE — Progress Notes (Signed)
Vitals:   01/17/18 0942  BP: (!) 196/97  Pulse: 68  Resp: 18  Temp: 97.9 F (36.6 C)  TempSrc: Oral  SpO2: 98%  Weight: 229 lb (103.9 kg)  Height: 5\' 7"  (1.702 m)

## 2018-01-18 ENCOUNTER — Other Ambulatory Visit: Payer: Self-pay

## 2018-01-18 DIAGNOSIS — I714 Abdominal aortic aneurysm, without rupture, unspecified: Secondary | ICD-10-CM

## 2018-01-18 DIAGNOSIS — R0989 Other specified symptoms and signs involving the circulatory and respiratory systems: Secondary | ICD-10-CM

## 2018-01-18 DIAGNOSIS — I872 Venous insufficiency (chronic) (peripheral): Secondary | ICD-10-CM

## 2018-01-21 DIAGNOSIS — H2513 Age-related nuclear cataract, bilateral: Secondary | ICD-10-CM | POA: Diagnosis not present

## 2018-01-21 DIAGNOSIS — H2511 Age-related nuclear cataract, right eye: Secondary | ICD-10-CM | POA: Diagnosis not present

## 2018-01-21 DIAGNOSIS — E113393 Type 2 diabetes mellitus with moderate nonproliferative diabetic retinopathy without macular edema, bilateral: Secondary | ICD-10-CM | POA: Diagnosis not present

## 2018-01-21 DIAGNOSIS — H35033 Hypertensive retinopathy, bilateral: Secondary | ICD-10-CM | POA: Diagnosis not present

## 2018-01-21 DIAGNOSIS — H353132 Nonexudative age-related macular degeneration, bilateral, intermediate dry stage: Secondary | ICD-10-CM | POA: Diagnosis not present

## 2018-01-21 DIAGNOSIS — H25013 Cortical age-related cataract, bilateral: Secondary | ICD-10-CM | POA: Diagnosis not present

## 2018-02-06 ENCOUNTER — Ambulatory Visit (INDEPENDENT_AMBULATORY_CARE_PROVIDER_SITE_OTHER): Payer: Medicare Other | Admitting: Urology

## 2018-02-06 DIAGNOSIS — C61 Malignant neoplasm of prostate: Secondary | ICD-10-CM

## 2018-02-07 DIAGNOSIS — C61 Malignant neoplasm of prostate: Secondary | ICD-10-CM | POA: Diagnosis not present

## 2018-02-08 ENCOUNTER — Other Ambulatory Visit: Payer: Self-pay | Admitting: Urology

## 2018-02-08 ENCOUNTER — Encounter: Payer: Self-pay | Admitting: Radiation Oncology

## 2018-02-08 DIAGNOSIS — C61 Malignant neoplasm of prostate: Secondary | ICD-10-CM

## 2018-02-12 DIAGNOSIS — H25811 Combined forms of age-related cataract, right eye: Secondary | ICD-10-CM | POA: Diagnosis not present

## 2018-02-12 DIAGNOSIS — H2511 Age-related nuclear cataract, right eye: Secondary | ICD-10-CM | POA: Diagnosis not present

## 2018-02-18 ENCOUNTER — Encounter (HOSPITAL_COMMUNITY)
Admission: RE | Admit: 2018-02-18 | Discharge: 2018-02-18 | Disposition: A | Discharge status: Home / Self Care | Payer: Medicare Other | Source: Ambulatory Visit | Attending: Urology | Admitting: Urology

## 2018-02-18 ENCOUNTER — Encounter (HOSPITAL_COMMUNITY): Payer: Self-pay

## 2018-02-18 DIAGNOSIS — C61 Malignant neoplasm of prostate: Secondary | ICD-10-CM | POA: Diagnosis not present

## 2018-02-18 MED ORDER — TECHNETIUM TC 99M MEDRONATE IV KIT
20.0000 | PACK | Freq: Once | INTRAVENOUS | Status: AC | PRN
  Administered 2018-02-18: 18.72 via INTRAVENOUS

## 2018-02-20 ENCOUNTER — Other Ambulatory Visit: Payer: Self-pay | Admitting: Urology

## 2018-02-20 DIAGNOSIS — C61 Malignant neoplasm of prostate: Secondary | ICD-10-CM

## 2018-03-04 ENCOUNTER — Ambulatory Visit: Payer: Medicare Other

## 2018-03-04 ENCOUNTER — Ambulatory Visit: Payer: Medicare Other | Admitting: Radiation Oncology

## 2018-03-06 DIAGNOSIS — H2512 Age-related nuclear cataract, left eye: Secondary | ICD-10-CM | POA: Diagnosis not present

## 2018-03-06 DIAGNOSIS — H25012 Cortical age-related cataract, left eye: Secondary | ICD-10-CM | POA: Diagnosis not present

## 2018-03-07 ENCOUNTER — Ambulatory Visit (HOSPITAL_COMMUNITY)
Admission: RE | Admit: 2018-03-07 | Discharge: 2018-03-07 | Disposition: A | Discharge status: Home / Self Care | Payer: Medicare Other | Source: Ambulatory Visit | Attending: Urology | Admitting: Urology

## 2018-03-07 DIAGNOSIS — N281 Cyst of kidney, acquired: Secondary | ICD-10-CM | POA: Diagnosis not present

## 2018-03-07 DIAGNOSIS — C61 Malignant neoplasm of prostate: Secondary | ICD-10-CM | POA: Insufficient documentation

## 2018-03-07 DIAGNOSIS — N200 Calculus of kidney: Secondary | ICD-10-CM | POA: Diagnosis not present

## 2018-03-07 DIAGNOSIS — I714 Abdominal aortic aneurysm, without rupture: Secondary | ICD-10-CM | POA: Diagnosis not present

## 2018-03-07 DIAGNOSIS — I700 Atherosclerosis of aorta: Secondary | ICD-10-CM | POA: Insufficient documentation

## 2018-03-07 DIAGNOSIS — K8020 Calculus of gallbladder without cholecystitis without obstruction: Secondary | ICD-10-CM | POA: Diagnosis not present

## 2018-03-07 LAB — POCT I-STAT CREATININE: Creatinine, Ser: 2 mg/dL — ABNORMAL HIGH (ref 0.61–1.24)

## 2018-03-07 MED ORDER — IOPAMIDOL (ISOVUE-300) INJECTION 61%
100.0000 mL | Freq: Once | INTRAVENOUS | Status: AC | PRN
  Administered 2018-03-07: 80 mL via INTRAVENOUS

## 2018-03-12 ENCOUNTER — Ambulatory Visit (INDEPENDENT_AMBULATORY_CARE_PROVIDER_SITE_OTHER): Payer: Medicare Other | Admitting: *Deleted

## 2018-03-12 ENCOUNTER — Telehealth: Payer: Self-pay | Admitting: Cardiology

## 2018-03-12 DIAGNOSIS — I495 Sick sinus syndrome: Secondary | ICD-10-CM | POA: Diagnosis not present

## 2018-03-12 DIAGNOSIS — H25812 Combined forms of age-related cataract, left eye: Secondary | ICD-10-CM | POA: Diagnosis not present

## 2018-03-12 DIAGNOSIS — H2512 Age-related nuclear cataract, left eye: Secondary | ICD-10-CM | POA: Diagnosis not present

## 2018-03-12 DIAGNOSIS — I442 Atrioventricular block, complete: Secondary | ICD-10-CM

## 2018-03-12 NOTE — Telephone Encounter (Signed)
LMOVM reminding pt to send remote transmission.   

## 2018-03-12 NOTE — Progress Notes (Signed)
Remote pacemaker transmission.   

## 2018-03-13 ENCOUNTER — Encounter: Payer: Self-pay | Admitting: Radiation Oncology

## 2018-03-13 DIAGNOSIS — C61 Malignant neoplasm of prostate: Secondary | ICD-10-CM | POA: Insufficient documentation

## 2018-03-13 NOTE — Progress Notes (Signed)
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  Biopsies revealed:    Past/Anticipated interventions by urology, if any: Transrectal ultrasound of the prostate with biopsies, referral to radiation oncology for consideration of radiation therapy  Past/Anticipated interventions by medical oncology, if any: no  Weight changes, if any: no  Bowel/Bladder complaints, if any:  Nocturia 2-3 times a night. Occasionally patient: has the sensation of not emptying his bladder completely, urinates less that 2 hours after he has finished voiding, has to start and stop again several times when urinating, has a weak urinary stream, and finds it difficult to postpone urination. Denies dysuria or hematuria. Reports urinary urgency and frequency. Denies urinary leakage or incontinence.   Nausea/Vomiting, if any: no  Pain issues, if any:  no  SAFETY ISSUES:  Prior radiation? no  Pacemaker/ICD? yes  Possible current pregnancy? N/A  Is the patient on methotrexate? no  Current Complaints / other details:  77 year old male. Married to Dot. Two grown children. Retired pharmacist.Presents today for his radiation consult regarding his newly diagnosed prostate cancer. Resides in [place].

## 2018-03-14 ENCOUNTER — Ambulatory Visit
Admission: RE | Admit: 2018-03-14 | Discharge: 2018-03-14 | Disposition: A | Discharge status: Home / Self Care | Payer: Medicare Other | Source: Ambulatory Visit | Attending: Radiation Oncology | Admitting: Radiation Oncology

## 2018-03-14 ENCOUNTER — Encounter: Payer: Self-pay | Admitting: Radiation Oncology

## 2018-03-14 ENCOUNTER — Other Ambulatory Visit: Payer: Self-pay

## 2018-03-14 DIAGNOSIS — J449 Chronic obstructive pulmonary disease, unspecified: Secondary | ICD-10-CM | POA: Diagnosis not present

## 2018-03-14 DIAGNOSIS — M1990 Unspecified osteoarthritis, unspecified site: Secondary | ICD-10-CM | POA: Diagnosis not present

## 2018-03-14 DIAGNOSIS — Z951 Presence of aortocoronary bypass graft: Secondary | ICD-10-CM | POA: Diagnosis not present

## 2018-03-14 DIAGNOSIS — Z7984 Long term (current) use of oral hypoglycemic drugs: Secondary | ICD-10-CM | POA: Diagnosis not present

## 2018-03-14 DIAGNOSIS — I2510 Atherosclerotic heart disease of native coronary artery without angina pectoris: Secondary | ICD-10-CM | POA: Diagnosis not present

## 2018-03-14 DIAGNOSIS — C61 Malignant neoplasm of prostate: Secondary | ICD-10-CM | POA: Diagnosis not present

## 2018-03-14 DIAGNOSIS — Z87891 Personal history of nicotine dependence: Secondary | ICD-10-CM | POA: Diagnosis not present

## 2018-03-14 DIAGNOSIS — Z96642 Presence of left artificial hip joint: Secondary | ICD-10-CM | POA: Insufficient documentation

## 2018-03-14 DIAGNOSIS — R972 Elevated prostate specific antigen [PSA]: Secondary | ICD-10-CM | POA: Diagnosis not present

## 2018-03-14 DIAGNOSIS — I10 Essential (primary) hypertension: Secondary | ICD-10-CM | POA: Diagnosis not present

## 2018-03-14 DIAGNOSIS — Z7901 Long term (current) use of anticoagulants: Secondary | ICD-10-CM | POA: Diagnosis not present

## 2018-03-14 DIAGNOSIS — Z7982 Long term (current) use of aspirin: Secondary | ICD-10-CM | POA: Diagnosis not present

## 2018-03-14 DIAGNOSIS — E119 Type 2 diabetes mellitus without complications: Secondary | ICD-10-CM | POA: Insufficient documentation

## 2018-03-14 DIAGNOSIS — Z79899 Other long term (current) drug therapy: Secondary | ICD-10-CM | POA: Diagnosis not present

## 2018-03-14 HISTORY — DX: Malignant neoplasm of prostate: C61

## 2018-03-14 HISTORY — DX: Presence of cardiac pacemaker: Z95.0

## 2018-03-14 NOTE — Progress Notes (Signed)
See progress note under physician encounter. 

## 2018-03-14 NOTE — Progress Notes (Signed)
Radiation Oncology         [phone]) [phone] ________________________________  Initial outpatient Consultation  Name: [name] MRN: [id number]  Date: 03/14/2018  DOB: [birth year]  VK:[facility], [name], MD  McKenzie, Candee Furbish, MD   REFERRING PHYSICIAN: Cleon Gustin, MD  DIAGNOSIS: 77 y.o. 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    ICD-10-CM   1. Malignant neoplasm of prostate ([place]) C61     HISTORY OF PRESENT ILLNESS: [name] is a 77 y.o. male with a diagnosis of prostate cancer. He was noted to have an elevated PSA of 6.9 with firmness of the left lobe prostate on rectal exam by his primary care physician, Dr. Monico Blitz, MD in 08/2017.  He was treated with Cipro for 21 days days which reportedly failed to improve PSA. Accordingly, he was referred for evaluation in urology by Dr. Alyson Ingles on 01/10/18,  digital rectal examination was performed at that time revealing the left lobe firmness without discrete nodule.   The patient proceeded to transrectal ultrasound with 12 biopsies of the prostate on 01/09/18.  The prostate volume measured 32.1 cc.  Out of 12 core biopsies, 5 were positive.  The maximum Gleason score was 4+3 and this was seen in right mid lateral and right apex lateral. Additionally, Gleason 3+4 disease was seen in the left mid lateral, right mid and right apex.    He proceeded with a bone scan on 02/18/18 for disease staging which revealed degenerative changes in the spine, no suspicion for bony metastases. He also underwent a CT scan A/P on 03/07/18 which revealed borderline enlarged left retroperitoneal and right common iliac nodal chain lymph nodes measuring up to 1.3 cm, nonspecific.   The patient reviewed the biopsy results with his urologist and he has kindly been referred today for discussion of potential radiation treatment options. Today he presents with nocturia 2-3 times a night. Patient has occasional  sensation of not emptying his bladder completely. He urinates less than 2 hours after he had finished voiding and has to start and stop again several times when urinating. He has a weak urinary stream, and finds it difficult to postpone urination.    PREVIOUS RADIATION THERAPY: No  PAST MEDICAL HISTORY:  Past Medical History:  Diagnosis Date  . AAA (abdominal aortic aneurysm) ([place])   . COPD (chronic obstructive pulmonary disease) ([place])   . Coronary artery disease 2000   Status post CABG  . Diabetes mellitus    DM2  . Dyslipidemia   . Dyspnea   . History of kidney stones   . Hypertension   . Obesity   . Osteoarthritis   . Pacemaker   . Prostate cancer ([place])   . Sleep apnea    USING CPAP  . Spinal stenosis   . SSS (sick sinus syndrome) ([place]) 2000   s/p PPM      PAST SURGICAL HISTORY: Past Surgical History:  Procedure Laterality Date  . BIOPSY PROSTATE  01/09/2018  . CATARACT EXTRACTION Left   . CORONARY ARTERY BYPASS GRAFT     2000  . JOINT REPLACEMENT     L hip Dr. Wynelle Link 07-18-17  . KNEE ARTHROPLASTY     BIL  . L5-S1 Gill decompressive laminectomy    . PACEMAKER INSERTION     MDT implanted for sick sinus syndrome  . PERMANENT PACEMAKER GENERATOR CHANGE N/A 03/30/2014   MDT Adapta L generator change by Dr Rayann Heman  .  Right total hip arthroplasty.    [name] TOTAL HIP ARTHROPLASTY Left 07/18/2017   Procedure: LEFT TOTAL HIP ARTHROPLASTY ANTERIOR APPROACH;  Surgeon: [name], MD;  Location: [facility];  Service: Orthopedics;  Laterality: Left;  . TRANSRECTAL ULTRASOUND  01/09/2018    FAMILY HISTORY:  Family History  Problem Relation Age of Onset  . CAD Father   . Stroke Father        Deceased, 73  . Hypertension Mother   . Stroke Mother        Deceased, 22  . Heart disease Mother   . Stroke Brother        Deceased, 82  . Hypertension Sister        Deceased, 70  . Heart disease Sister   . Healthy Son   . Breast cancer Daughter   . Prostate cancer Neg Hx   .  Colon cancer Neg Hx   . Pancreatic cancer Neg Hx     SOCIAL HISTORY: He is a retired Software engineer.  He and his wife live in [place], [place]. Social History   Socioeconomic History  . Marital status: Married    Spouse name: Dot  . Number of children: 2  . Years of education: Not on file  . Highest education level: Not on file  Occupational History  . Occupation: Marine scientist: OTHER    Comment: [employer]   Social Needs  . Financial resource strain: Not on file  . Food insecurity:    Worry: Not on file    Inability: Not on file  . Transportation needs:    Medical: Not on file    Non-medical: Not on file  Tobacco Use  . Smoking status: Former Smoker    Packs/day: 1.00    Years: 42.00    Pack years: 42.00    Types: Cigarettes    Start date: 01/28/1961    Last attempt to quit: 10/30/1998    Years since quitting: 19.3  . Smokeless tobacco: Never Used  Substance and Sexual Activity  . Alcohol use: No    Alcohol/week: 0.0 standard drinks  . Drug use: No  . Sexual activity: Not Currently  Lifestyle  . Physical activity:    Days per week: Not on file    Minutes per session: Not on file  . Stress: Not on file  Relationships  . Social connections:    Talks on phone: Not on file    Gets together: Not on file    Attends religious service: Not on file    Active member of club or organization: Not on file    Attends meetings of clubs or organizations: Not on file    Relationship status: Not on file  . Intimate partner violence:    Fear of current or ex partner: No    Emotionally abused: No    Physically abused: No    Forced sexual activity: No  Other Topics Concern  . Not on file  Social History Narrative   Lives with wife in a 2 story home.  They have 2 grown children.     Retired Software engineer.      ALLERGIES: Other  MEDICATIONS:  Current Outpatient Medications  Medication Sig Dispense Refill  . aspirin EC 81 MG tablet Take 81 mg by mouth daily.    [name] atorvastatin  (LIPITOR) 40 MG tablet Take 40 mg by mouth daily.    . Coenzyme Q10 (CO Q 10 PO) Take 200 mg by mouth daily.    [name]  diclofenac (VOLTAREN) 75 MG EC tablet Take 75 mg by mouth 2 (two) times daily.    [name] glimepiride (AMARYL) 4 MG tablet Take 4 mg by mouth daily.     . hydrochlorothiazide (HYDRODIURIL) 25 MG tablet hydrochlorothiazide 25 mg tablet  TAKE 1/2 TO 1 TABLET BY MOUTH DAILY    . ketorolac (ACULAR) 0.5 % ophthalmic solution instill ONE drop in right eye FOUR TIMES DAILY - start ONE WEEK prior TO surgery  4  . losartan (COZAAR) 100 MG tablet Take 100 mg by mouth daily.    . metoprolol succinate (TOPROL-XL) 50 MG 24 hr tablet metoprolol succinate ER 50 mg tablet,extended release 24 hr  TAKE 1 TABLET BY MOUTH IN THE MORNING AND TAKE 1/2 TABLET BY MOUTH IN THE EVENING    . Multiple Vitamins-Minerals (MULTIVITAMIN ADULTS PO) Take by mouth daily.    [name] ofloxacin (OCUFLOX) 0.3 % ophthalmic solution instill ONE drop in BOTH eyes FOUR TIMES DAILY - start 72 hours prior TO surgery  1  . Omega-3 Fatty Acids (FISH OIL) 1200 MG CAPS Take by mouth.    . pioglitazone (ACTOS) 30 MG tablet pioglitazone 30 mg tablet  TAKE 1/2 TO 1 TABLET BY MOUTH DAILY    . prednisoLONE acetate (PRED FORTE) 1 % ophthalmic suspension instill ONE drop in right eye FOUR TIMES DAILY - start AFTER surgery  1  . rivaroxaban (XARELTO) 10 MG TABS tablet Xarelto 10 mg tablet  take 1 tablet BY MOUTH with breakfast.Take xarelto FOR 2&1/2 more WEEKS following discharge from THE hospital,then discontinue xarelto.(afte    . Colchicine 0.6 MG CAPS colchicine 0.6 mg capsule  TAKE ONE CAPSULE BY MOUTH EVERY 2 HOURS AS NEEDED FOR GOUT- MAX THREE CAPSULES PER DAY    . methocarbamol (ROBAXIN) 500 MG tablet Take 1 tablet (500 mg total) by mouth every 6 (six) hours as needed for muscle spasms. (Patient not taking: Reported on 01/17/2018) 80 tablet 0  . oxyCODONE (OXY IR/ROXICODONE) 5 MG immediate release tablet Take 1-2 tablets (5-10 mg total) by  mouth every 4 (four) hours as needed for moderate pain or severe pain. (Patient not taking: Reported on 01/17/2018) 60 tablet 0  . oxyCODONE-acetaminophen (PERCOCET/ROXICET) 5-325 MG tablet oxycodone 5 mg tablet  TAKE 1 OR 2 TABLETS BY MOUTH EVERY 4 HOURS AS NEEDED FOR moderate pain OR SEVERE pain     No current facility-administered medications for this encounter.     REVIEW OF SYSTEMS:  On review of systems, the patient reports that he is doing well overall. He denies any chest pain, shortness of breath, cough, fevers, chills, night sweats, unintended weight changes. He denies any bowel disturbances, and denies abdominal pain, nausea or vomiting, he does report occasional loose bowels but denies having constipation. He denies any new musculoskeletal or joint aches or pains. His IPSS was 10, indicating moderate urinary symptoms with frequency, urgency, weak stream, intermittency and occasional feelings of incomplete emptying.  He reports nocturia 2-3 x/night.  He reports ED and is only able to maintain erections less than half the time to complete sexual activity. A complete review of systems is obtained and is otherwise negative.    PHYSICAL EXAM:  Wt Readings from Last 3 Encounters:  03/14/18 237 lb (107.5 kg)  01/17/18 229 lb (103.9 kg)  07/18/17 223 lb (101.2 kg)   Temp Readings from Last 3 Encounters:  03/14/18 98 F (36.7 C)  01/17/18 97.9 F (36.6 C) (Oral)  01/09/18 98.2 F (36.8 C)   BP  Readings from Last 3 Encounters:  03/14/18 (!) 171/81  01/17/18 (!) 178/96  01/09/18 (!) 179/98   Pulse Readings from Last 3 Encounters:  03/14/18 62  01/17/18 (!) 58  01/09/18 80   Pain Assessment Pain Score: 0-No pain/10  In general this is a well appearing caucasian gentleman in no acute distress. He is alert and oriented x4 and appropriate throughout the examination. HEENT reveals that the patient is normocephalic, atraumatic. EOMs are intact. PERRLA. Skin is intact without any  evidence of gross lesions. Cardiovascular exam reveals a regular rate and rhythm, no clicks rubs or murmurs are auscultated. Chest is clear to auscultation bilaterally. Lymphatic assessment is performed and does not reveal any adenopathy in the cervical, supraclavicular, axillary, or inguinal chains. Abdomen has active bowel sounds in all quadrants and is intact. The abdomen is soft, non tender, non distended. Lower extremities are negative for pretibial pitting edema, deep calf tenderness, cyanosis or clubbing.   KPS = 100  100 - Normal; no complaints; no evidence of disease. 90   - Able to carry on normal activity; minor signs or symptoms of disease. 80   - Normal activity with effort; some signs or symptoms of disease. 18   - Cares for self; unable to carry on normal activity or to do active work. 60   - Requires occasional assistance, but is able to care for most of his personal needs. 50   - Requires considerable assistance and frequent medical care. 3   - Disabled; requires special care and assistance. 21   - Severely disabled; hospital admission is indicated although death not imminent. 22   - Very sick; hospital admission necessary; active supportive treatment necessary. 10   - Moribund; fatal processes progressing rapidly. 0     - Dead  Karnofsky DA, Abelmann Palmer, Craver LS and Burchenal [facility] [phone]) The use of the nitrogen mustards in the palliative treatment of carcinoma: with particular reference to bronchogenic carcinoma Cancer 1 634-56  LABORATORY DATA:  Lab Results  Component Value Date   WBC 9.6 07/19/2017   HGB 9.8 (L) 07/19/2017   HCT 29.6 (L) 07/19/2017   MCV 91.4 07/19/2017   PLT 144 (L) 07/19/2017   Lab Results  Component Value Date   NA 137 07/19/2017   K 4.1 07/19/2017   CL 107 07/19/2017   CO2 24 07/19/2017   Lab Results  Component Value Date   ALT 21 07/11/2017   AST 26 07/11/2017   ALKPHOS 135 (H) 07/11/2017   BILITOT 1.0 07/11/2017     RADIOGRAPHY:  Ct Abdomen Pelvis W Wo Contrast  Result Date: 03/07/2018 CLINICAL DATA:  Prostate cancer diagnosed 2 months ago. Elevated PSA. EXAM: CT ABDOMEN AND PELVIS WITHOUT AND WITH CONTRAST TECHNIQUE: Multidetector CT imaging of the abdomen and pelvis was performed following the standard protocol before and following the bolus administration of intravenous contrast. CONTRAST:  53m ISOVUE-300 IOPAMIDOL (ISOVUE-300) INJECTION 61% COMPARISON:  CT AP 06/12/2011 FINDINGS: Lower chest: The heart size appears enlarged. Previous median sternotomy and CABG procedure. Aortic atherosclerosis noted. Hepatobiliary: No focal liver abnormality identified. Multiple tiny stones identified layering within the dependent portion of the gallbladder. No gallbladder wall thickening or biliary ductal dilatation. Pancreas: Scattered parenchymal calcifications within the pancreas suggest chronic pancreatitis. No acute pancreatic inflammation, mass or main duct dilatation. Spleen: Normal in size without focal abnormality. Adrenals/Urinary Tract: Normal adrenal glands. Bilateral kidney stones identified. The largest is in the interpolar right kidney measuring 1 cm, image 75/4. There are  bilateral kidney cysts noted. The largest arises from the upper pole of left kidney measuring 4.3 cm. No hydronephrosis identified bilaterally. The urinary bladder appears normal. No focal bladder abnormality. Stomach/Bowel: The stomach appears normal. The small bowel loops have a normal course and caliber. Unremarkable appearance of the colon. Vascular/Lymphatic: Aortic atherosclerosis. Infrarenal abdominal aorta measures 4.2 cm, image 50/14. Left retroperitoneal lymph node measures 1.3 cm, image 56/14. Right common iliac node is mildly enlarged measuring 1.1 cm, image 59/4. Reproductive: Prostate gland is unremarkable. Other: No free fluid or fluid collections. Fat containing right inguinal hernia noted. Musculoskeletal: Previous bilateral hip arthroplasty. The  patient is status post posterior thoracic and lumbar decompression and posterior fixation no aggressive lytic or sclerotic bone lesions. IMPRESSION: 1. Borderline enlarged left retroperitoneal and right common iliac nodal chain lymph nodes identified measuring up to 1.3 cm. No additional findings suggestive of prostate cancer metastasis is identified. 2. Infrarenal abdominal aortic aneurysm measures 4.2 cm. Recommend followup by [place] in 1 year. This recommendation follows ACR consensus guidelines: White Paper of the ACR Incidental Findings Committee II on Vascular Findings. J Am Coll Radiol 2013; 10:789-794. Aortic aneurysm NOS (ICD10-I71.9). 3.  Aortic Atherosclerosis (ICD10-I70.0). 4. Bilateral renal calculi and renal cysts.  No hydronephrosis. 5. Gallstones. Electronically Signed   By: Kerby Moors M.D.   On: 03/07/2018 12:06   Nm Bone Scan Whole Body  Result Date: 02/18/2018 CLINICAL DATA:  77 year old male with prostate cancer diagnosed 2 weeks ago. Initial encounter. EXAM: NUCLEAR MEDICINE WHOLE BODY BONE SCAN TECHNIQUE: Whole body anterior and posterior images were obtained approximately 3 hours after intravenous injection of radiopharmaceutical. RADIOPHARMACEUTICALS:  18.72 mCi Technetium-43mMDP IV COMPARISON:  07/18/2017 hip films. 12/18/2012 thoracic spine plain films. FINDINGS: Radiotracer uptake involving the thoracic and lumbar spine. Patient has had extensive surgery/fusion involving the lower thoracic and lumbar spine with prominent degenerative changes. Radiotracer uptake probably related to such rather metastatic disease. Slight radiotracer uptake surrounding bilateral hip replacements and knee replacement felt to be related to the hip/knee replacements without typical distribution to suggest loosening. Radiotracer uptake surrounding the feet, sacroiliac joints, wrists, elbows, shoulders and sternoclavicular junction have an appearance most suggestive of degenerative changes rather metastatic  disease. Overall, no radiotracer uptake specific for osseous metastatic disease noted. Both kidneys visualized. IMPRESSION: Areas of radiotracer uptake probably related to degenerative changes/prior surgeries without area of radiotracer uptake suspicious for osseous metastatic disease. This will represent the patient's baseline bone scan to which follow-up can be compared. Electronically Signed   By: SGenia DelM.D.   On: 02/18/2018 15:26      IMPRESSION/PLAN: 1. 77y.o. 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 Dr. MTammi Klippeland I discussed the patient's workup and outlined the nature of prostate cancer in this setting. The patient's T stage, Gleason's score, and PSA put him into the unfavorable intermediate risk group. Accordingly, he is eligible for a variety of potential treatment options including brachytherapy, 5.5 - 8 weeks of external radiation or 5 weeks of external radiation followed by a brachytherapy boost. We discussed the available radiation techniques, and focused on the details and logistics and delivery.  We discussed and outlined the risks, benefits, short and long-term effects associated with radiotherapy and compared and contrasted these with prostatectomy. We discussed the role of SpaceOAR in reducing the rectal toxicity associated with radiotherapy.    At the conclusion of our conversation, the patient is interested in moving forward with  brachytherapy and use of SpaceOAR to reduce rectal toxicity from radiotherapy.  We will share our discussion with Dr. Dr. Alyson Ingles and move forward with scheduling his [facility] planning appointment in the near future.  Prior to scheduling his procedure, we will obtain cardiac clearance from his cardiologist, Dr. Rayann Heman. The patient met briefly with Romie Jumper in our office who will be working closely with him to coordinate OR scheduling and pre and post procedure appointments.  We will contact  the pharmaceutical rep to ensure that [place] is available at the time of procedure.  He will have a prostate MRI following his post-seed CT SIM to confirm appropriate distribution of the [place].  We enjoyed meeting with him, his wife and daughter today, and will look forward to participating in the care of this very nice gentleman.   We spent 60 minutes minutes face to face with the patient and more than 50% of that time was spent in counseling and/or coordination of care.    [name], PA-C    Tyler Pita, MD  [facility] Direct Dial: [phone]  Fax: [phone] [email or web address]  Skype  [email or web address]  This document serves as a record of services personally performed by Tyler Pita, MD and Ashlyn Bruning PA-C. It was created on their behalf by Delton Coombes, a trained medical scribe. The creation of this record is based on the scribe's personal observations and the provider's statements to them.

## 2018-03-18 ENCOUNTER — Telehealth: Payer: Self-pay | Admitting: *Deleted

## 2018-03-18 ENCOUNTER — Encounter: Payer: Self-pay | Admitting: Medical Oncology

## 2018-03-18 NOTE — Progress Notes (Signed)
Spoke with [name] to introduce myself as the prostate nurse navigator and my role. I was unable to meet him the day he consulted with Dr. Tammi Klippel. He states his visit went well and he has chosen brachytherapy to treat his prostate cancer. I asked him if he had received Shirley's message regarding cardiac appointment. He states he has not.I gave him the appointment for 8/21 @ Dr. Bettey Costa [facility], with PA- Jennings Books for cardiac clearance- on August 21 - arrival time - 10:45 am @ [address]. He voiced understanding and I will notify Enid Derry. I asked him to call me with questions or concerns.

## 2018-03-18 NOTE — Telephone Encounter (Signed)
Called patient to inform of appt. @ Dr. Bettey Costa Office, with PA- Jennings Books for cardiac clearance- on August 21 - arrival time - 10:45 am @ [address], [address], [place] for a return call

## 2018-03-18 NOTE — Progress Notes (Signed)
Cardiology Office Note Date:  03/20/2018  Patient ID:  [name], [name] [birth year], MRN [id number] PCP:  Monico Blitz, MD  Electrophysiologist:  Dr. Rayann Heman   Chief Complaint:  pre-op  History of Present Illness: [name] is a 77 y.o. male with history of SSX w/PPM, AAA and iliac artery aneurysms (followed by Dr. Donnetta Hutching, last saw in Jun 2019, planned for 6 mo f/u, mentioned chronic stable claudication), COPD, DM, CAD (CABG remotely in 2000), HTN, HLD, OSA w/CPAP.  He comes in today to be seen for Dr. Rayann Heman.  He last saw him in Oct 2018, at that planning hip surgery in Dec and planned for stress testing to be done with some DOE/SOB, noting given orthopedic issues his mobility was limited.  Felt if stress was low risk no further CV w/u was needed pre-op.  He had his L hip surgery 97/35/32 without complication.  The patient reports a new diagnosis of prostate cancer, is planned for seed implant and needs clearance prior to proceeding.  He tells me he did well with his hip surgery, no issues.  He is feeling well, no CP, palpitations or SOB.  No dizziness, near syncope or syncope.  He reports no difficulty with his ADL's, has some orthopedic limitations with heavier activities, longer walks, though continues to care for his yard, active around the house, and denies any exertional intolerances until his knee,back, hip act up.  He denies symptoms of PND or orthopnea.  RCRI score is 0.9 (with Creat 2.0), prior Creathve been lower in the most recent 1.89 and 1.38 DASI score/METS 5.93   Device information: MDT dual chamber PPM, implanted 2000, gen change 2015 RV lead is a unipolar lead.  There is some RV lead noise however this is stable and he does not V pace  Past Medical History:  Diagnosis Date  . AAA (abdominal aortic aneurysm) ([place])   . COPD (chronic obstructive pulmonary disease) ([place])   . Coronary artery disease 2000   Status post CABG  . Diabetes mellitus    DM2  .  Dyslipidemia   . Dyspnea   . History of kidney stones   . Hypertension   . Obesity   . Osteoarthritis   . Pacemaker   . Prostate cancer ([name])   . Sleep apnea    USING CPAP  . Spinal stenosis   . SSS (sick sinus syndrome) ([place]) 2000   s/p PPM    Past Surgical History:  Procedure Laterality Date  . BIOPSY PROSTATE  01/09/2018  . CATARACT EXTRACTION Left   . CORONARY ARTERY BYPASS GRAFT     2000  . JOINT REPLACEMENT     L hip Dr. Wynelle Link 07-18-17  . KNEE ARTHROPLASTY     BIL  . L5-S1 Gill decompressive laminectomy    . PACEMAKER INSERTION     MDT implanted for sick sinus syndrome  . PERMANENT PACEMAKER GENERATOR CHANGE N/A 03/30/2014   MDT Adapta L generator change by Dr Rayann Heman  . Right total hip arthroplasty.    [name] TOTAL HIP ARTHROPLASTY Left 07/18/2017   Procedure: LEFT TOTAL HIP ARTHROPLASTY ANTERIOR APPROACH;  Surgeon: [name], MD;  Location: [facility];  Service: Orthopedics;  Laterality: Left;  . TRANSRECTAL ULTRASOUND  01/09/2018    Current Outpatient Medications  Medication Sig Dispense Refill  . aspirin EC 81 MG tablet Take 81 mg by mouth daily.    [name] atorvastatin (LIPITOR) 40 MG tablet Take 40 mg by mouth daily.    [name]  Coenzyme Q10 (CO Q 10 PO) Take 200 mg by mouth daily.    . diclofenac (VOLTAREN) 75 MG EC tablet Take 75 mg by mouth 2 (two) times daily.    [name] glimepiride (AMARYL) 4 MG tablet Take 4 mg by mouth daily.     [name] ketorolac (ACULAR) 0.5 % ophthalmic solution instill ONE drop in right eye FOUR TIMES DAILY - start ONE WEEK prior TO surgery  4  . losartan (COZAAR) 100 MG tablet Take 100 mg by mouth daily.    . metoprolol succinate (TOPROL-XL) 50 MG 24 hr tablet metoprolol succinate ER 50 mg tablet,extended release 24 hr  TAKE 1 TABLET BY MOUTH IN THE MORNING AND TAKE 1/2 TABLET BY MOUTH IN THE EVENING    . Multiple Vitamins-Minerals (MULTIVITAMIN ADULTS PO) Take by mouth daily.    [name] ofloxacin (OCUFLOX) 0.3 % ophthalmic solution instill ONE drop in BOTH eyes  FOUR TIMES DAILY - start 72 hours prior TO surgery  1  . Omega-3 Fatty Acids (FISH OIL) 1200 MG CAPS Take by mouth.    . pioglitazone (ACTOS) 30 MG tablet pioglitazone 30 mg tablet  TAKE 1/2 TO 1 TABLET BY MOUTH DAILY    . prednisoLONE acetate (PRED FORTE) 1 % ophthalmic suspension instill ONE drop in right eye FOUR TIMES DAILY - start AFTER surgery  1   No current facility-administered medications for this visit.     Allergies:   Other   Social History:  The patient  reports that he quit smoking about 19 years ago. His smoking use included cigarettes. He started smoking about 57 years ago. He has a 42.00 pack-year smoking history. He has never used smokeless tobacco. He reports that he does not drink alcohol or use drugs.   Family History:  The patient's family history includes Breast cancer in his daughter; CAD in his father; Healthy in his son; Heart disease in his mother and sister; Hypertension in his mother and sister; Stroke in his brother, father, and mother.  ROS:  Please see the history of present illness.  All other systems are reviewed and otherwise negative.   PHYSICAL EXAM:  VS:  BP (!) 158/90   Pulse 64   Ht 5\' 7"  (1.702 m)   Wt 232 lb (105.2 kg)   BMI 36.34 kg/m  BMI: Body mass index is 36.34 kg/m. Well nourished, well developed, in no acute distress  HEENT: normocephalic, atraumatic  Neck: no JVD, carotid bruits or masses Cardiac: RRR; no significant murmurs, no rubs, or gallops Lungs:  CTA b/l, no wheezing, rhonchi or rales  Abd: soft, nontender MS: no deformity or atrophy Ext: trace edema, chronic looking skin changes Skin: warm and dry, no rash Neuro:  No gross deficits appreciated Psych: euthymic mood, full affect  PPM site is stable, no tethering or discomfort   EKG:  Done today and reviewed by myself SR, no acute looking changes PPM interrogation done today and reviewed by myself: battery and lead measurements are OK, RV is unipolar, HVR noted known to  have some intermittent noise on his lead, makers suggest this.He VP0.3%, AP 60%  01/17/18: Vascular US: Final Interpretation: Abdominal Aorta: There is evidence of abnormal dilitation of the Mid Abdominal aorta. There is evidence of abnormal dilation of the Left Common Iliac artery. The largest aortic measurement is 3.8 cm. Difficult study secondary to body habitus and bowel  gas.  06/07/17; Lexiscan stress myoview  No diagnostic ST segment changes to indicate ischemia.  Small, moderate intensity,  apical to basal inferolateral defect that is partially reversible and suggestive of scar with mild peri-infarct ischemia.  This is a low risk study.  Nuclear stress EF: 52%.  01/10/12; TTE Study Conclusions - Left ventricle: The cavity size was normal. There was moderate focal basal hypertrophy. Systolic function was normal. The estimated ejection fraction was in the range of 50% to 55%. Probable hypokinesis of the basalinferolateral myocardium. Doppler parameters are consistent with abnormal left ventricular relaxation (grade 1 diastolic dysfunction). - Aortic valve: Trileaflet; mildly calcified leaflets. No significant regurgitation. Mean gradient: 28mm Hg (S). - Mitral valve: Mild regurgitation. - Left atrium: The atrium was moderately dilated. - Right ventricle: The cavity size was mildly dilated. Pacer wire or catheter noted in right ventricle. - Tricuspid valve: Mild regurgitation. Peak RV-RA gradient: 20mm Hg (S). - Pulmonary arteries: Systolic pressure was within the normal range. - Pericardium, extracardiac: There was no pericardial effusion.    Recent Labs: 07/11/2017: ALT 21 07/19/2017: BUN 30; Hemoglobin 9.8; Platelets 144; Potassium 4.1; Sodium 137 03/07/2018: Creatinine, Ser 2.00  No results found for requested labs within last 8760 hours.   Estimated Creatinine Clearance: 35.7 mL/min (A) (by C-G formula based on SCr of 2 mg/dL (H)).   Wt  Readings from Last 3 Encounters:  03/20/18 232 lb (105.2 kg)  03/14/18 237 lb (107.5 kg)  01/17/18 229 lb (103.9 kg)     Other studies reviewed: Additional studies/records reviewed today include: summarized above  ASSESSMENT AND PLAN:  1. PPM     Known noise on unipolar RV lead, he VP 0.3%, measurements are OK  2. CAD     No anginal complaints     Low risk stress test Nov 2018 done prior to his hip surgery     On ASA, statin, BB     Lipids are monitored and managed with his PMD  3. 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 Pre-op     No cardiac contraindication to his planned procedure (Prostate seed implant)     Given below the waist, no particular pacer management is required, he is not pacer dependent  5. Noted waxing/waning Creat     He stated his PMD has been following his labs, does not recall discussion on any renal issues.       Encouraged adequate hydration and to f/u with his PMD    Disposition: F/u with q 3 month remote checks, and he would like to stay on track/schedule with Dr. Rayann Heman in [place] and keep his next appointment  Current medicines are reviewed at length with the patient today.  The patient did not have any concerns regarding medicines.  Venetia Night, PA-C 03/20/2018 12:56 PM     [address] [place] [address] [phone] (office)  [phone] (fax)

## 2018-03-20 ENCOUNTER — Ambulatory Visit (INDEPENDENT_AMBULATORY_CARE_PROVIDER_SITE_OTHER): Payer: Medicare Other | Admitting: Physician Assistant

## 2018-03-20 VITALS — BP 158/90 | HR 64 | Ht 67.0 in | Wt 232.0 lb

## 2018-03-20 DIAGNOSIS — I2510 Atherosclerotic heart disease of native coronary artery without angina pectoris: Secondary | ICD-10-CM

## 2018-03-20 DIAGNOSIS — Z01818 Encounter for other preprocedural examination: Secondary | ICD-10-CM | POA: Diagnosis not present

## 2018-03-20 DIAGNOSIS — I10 Essential (primary) hypertension: Secondary | ICD-10-CM

## 2018-03-20 DIAGNOSIS — Z950 Presence of cardiac pacemaker: Secondary | ICD-10-CM | POA: Diagnosis not present

## 2018-03-20 DIAGNOSIS — I495 Sick sinus syndrome: Secondary | ICD-10-CM

## 2018-03-20 NOTE — Patient Instructions (Addendum)
Medication Instructions:   Your physician recommends that you continue on your current medications as directed. Please refer to the Current Medication list given to you today.   If you need a refill on your cardiac medications before your next appointment, please call your pharmacy.  Labwork: NONE ORDERED  TODAY     Testing/Procedures: NONE ORDERED  TODAY    Follow-Up:  AS SCHEDULED WITH [facility]   Remote monitoring is used to monitor your Pacemaker of ICD from home. This monitoring reduces the number of office visits required to check your device to one time per year. It allows [place] to keep an eye on the functioning of your device to ensure it is working properly. You are scheduled for a device check from home on . 11-12-19You may send your transmission at any time that day. If you have a wireless device, the transmission will be sent automatically. After your physician reviews your transmission, you will receive a postcard with your next transmission date.   Any Other Special Instructions Will Be Listed Below (If Applicable).

## 2018-03-28 ENCOUNTER — Telehealth: Payer: Self-pay | Admitting: Internal Medicine

## 2018-03-28 NOTE — Telephone Encounter (Signed)
New Message      [facility] Pre-operative Risk Assessment    Request for surgical clearance:  1. What type of surgery is being performed? Prostate implant   2. When is this surgery scheduled? TBD  3. What type of clearance is required (medical clearance vs. Pharmacy clearance to hold med vs. Both)? Medical  4. Are there any medications that need to be held prior to surgery and how long?depends on Cardiologist  5. Practice name and name of physician performing surgery?  [facility]iation [facility]cology/ Dr. Tammi Klippel   6. What is your office phone number [phone]   7.   What is your office fax number [phone]  8.   Anesthesia type (None, local, MAC, general) ? general   [name] 03/28/2018, 11:39 AM  _________________________________________________________________   (provider comments below)

## 2018-03-28 NOTE — Telephone Encounter (Signed)
   Primary Cardiologist: Dr. Rayann Heman. Tommye Standard NP, 03/20/2018  Chart reviewed as part of pre-operative protocol coverage.  He was seen by Tommye Standard, NP, on 03/20/2018 and it was felt that [name] would be at acceptable risk for the planned procedure without further cardiovascular testing.   He is on aspirin, it would be preferential to continue this in the perioperative period.  I will route this recommendation to the requesting party via Epic fax function and remove from pre-op pool.  Please call with questions.  Siham Bucaro, PA-C 03/28/2018, 5:22 PM    5. [facility]/ Dr. Tammi Klippel   6. What is your office phone number [phone]   7.   What is your office fax number [phone]

## 2018-04-03 ENCOUNTER — Ambulatory Visit (INDEPENDENT_AMBULATORY_CARE_PROVIDER_SITE_OTHER): Payer: Medicare Other | Admitting: Urology

## 2018-04-03 DIAGNOSIS — C61 Malignant neoplasm of prostate: Secondary | ICD-10-CM | POA: Diagnosis not present

## 2018-04-05 LAB — CUP PACEART REMOTE DEVICE CHECK
Battery Impedance: 203 Ohm
Battery Remaining Longevity: 122 mo
Brady Statistic AP VP Percent: 0 %
Brady Statistic AP VS Percent: 60 %
Date Time Interrogation Session: 20190813162213
Implantable Lead Implant Date: 20000419
Implantable Lead Location: 753859
Implantable Lead Location: 753860
Implantable Lead Model: 4023
Implantable Pulse Generator Implant Date: 20150831
Lead Channel Impedance Value: 364 Ohm
Lead Channel Setting Pacing Amplitude: 2 V
Lead Channel Setting Pacing Pulse Width: 0.4 ms
MDC IDC LEAD IMPLANT DT: 20000419
MDC IDC MSMT BATTERY VOLTAGE: 2.78 V
MDC IDC MSMT LEADCHNL RA PACING THRESHOLD AMPLITUDE: 0.875 V
MDC IDC MSMT LEADCHNL RA PACING THRESHOLD PULSEWIDTH: 0.4 ms
MDC IDC MSMT LEADCHNL RV IMPEDANCE VALUE: 425 Ohm
MDC IDC MSMT LEADCHNL RV PACING THRESHOLD AMPLITUDE: 0.5 V
MDC IDC MSMT LEADCHNL RV PACING THRESHOLD PULSEWIDTH: 0.4 ms
MDC IDC SET LEADCHNL RV PACING AMPLITUDE: 2.5 V
MDC IDC SET LEADCHNL RV SENSING SENSITIVITY: 2.8 mV
MDC IDC STAT BRADY AS VP PERCENT: 0 %
MDC IDC STAT BRADY AS VS PERCENT: 40 %

## 2018-04-09 DIAGNOSIS — E1165 Type 2 diabetes mellitus with hyperglycemia: Secondary | ICD-10-CM | POA: Diagnosis not present

## 2018-04-09 DIAGNOSIS — Z299 Encounter for prophylactic measures, unspecified: Secondary | ICD-10-CM | POA: Diagnosis not present

## 2018-04-09 DIAGNOSIS — E1122 Type 2 diabetes mellitus with diabetic chronic kidney disease: Secondary | ICD-10-CM | POA: Diagnosis not present

## 2018-04-09 DIAGNOSIS — C61 Malignant neoplasm of prostate: Secondary | ICD-10-CM | POA: Diagnosis not present

## 2018-04-09 DIAGNOSIS — Z6839 Body mass index (BMI) 39.0-39.9, adult: Secondary | ICD-10-CM | POA: Diagnosis not present

## 2018-04-09 DIAGNOSIS — E1142 Type 2 diabetes mellitus with diabetic polyneuropathy: Secondary | ICD-10-CM | POA: Diagnosis not present

## 2018-04-17 ENCOUNTER — Telehealth: Payer: Self-pay | Admitting: *Deleted

## 2018-04-17 NOTE — Telephone Encounter (Signed)
   Primary Cardiologist:No primary care provider on file.  Chart reviewed as part of pre-operative protocol coverage. Pre-op clearance already addressed by colleagues in earlier phone notes. To summarize recommendations:  He was seen by Tommye Standard, NP, on 03/20/2018 and it was felt that Francoise Schaumann would be at acceptable risk for the planned procedure without further cardiovascular testing.   He is on aspirin, it would be preferential to continue this in the perioperative period.  I will route this recommendation to the requesting party via Epic fax function and remove from pre-op pool.  Please call with questions.   Lyda Jester, PA-C 04/17/2018, 11:41 AM

## 2018-04-17 NOTE — Telephone Encounter (Signed)
   [facility] Pre-operative Risk Assessment    Request for surgical clearance:  1. What type of surgery is being performed?  BRACHYTHERAPY WITH SPACE OAR INSERT   2. When is this surgery scheduled?  TBD  What type of clearance is required (medical clearance vs. Pharmacy clearance to hold med vs. Both)? CARDIAC 3. Are there any medications that need to be held prior to surgery and how long? PT MAY STAY ON ASA PER DR MCKENZIE  4. Practice name and name of physician performing surgery?  [place]  5. What is your office phone number [phone]   7.   What is your office fax number [phone]  8.   Anesthesia type (None, local, MAC, general) ? CHOICE   Devra Dopp 04/17/2018, 10:58 AM  _________________________________________________________________   (provider comments below)

## 2018-04-25 ENCOUNTER — Other Ambulatory Visit: Payer: Self-pay | Admitting: Urology

## 2018-04-29 ENCOUNTER — Telehealth: Payer: Self-pay | Admitting: *Deleted

## 2018-04-29 NOTE — Telephone Encounter (Signed)
CALLED PATIENT TO INFORM OF CHEST X-RAY AND EKG AND PRE-SEED PLANNING CT ON 05-09-18, AND HIS IMPLANT ON 06-10-18, SPOKE WITH PATIENT AND HE IS AWARE OF THESE APPTS.

## 2018-05-03 ENCOUNTER — Encounter: Payer: Self-pay | Admitting: Internal Medicine

## 2018-05-03 ENCOUNTER — Ambulatory Visit (INDEPENDENT_AMBULATORY_CARE_PROVIDER_SITE_OTHER): Payer: Medicare Other | Admitting: Internal Medicine

## 2018-05-03 VITALS — BP 190/108 | HR 81 | Ht 67.0 in | Wt 237.0 lb

## 2018-05-03 DIAGNOSIS — Z23 Encounter for immunization: Secondary | ICD-10-CM | POA: Diagnosis not present

## 2018-05-03 DIAGNOSIS — I495 Sick sinus syndrome: Secondary | ICD-10-CM

## 2018-05-03 DIAGNOSIS — Z950 Presence of cardiac pacemaker: Secondary | ICD-10-CM

## 2018-05-03 DIAGNOSIS — I442 Atrioventricular block, complete: Secondary | ICD-10-CM

## 2018-05-03 DIAGNOSIS — I2510 Atherosclerotic heart disease of native coronary artery without angina pectoris: Secondary | ICD-10-CM | POA: Diagnosis not present

## 2018-05-03 DIAGNOSIS — I10 Essential (primary) hypertension: Secondary | ICD-10-CM | POA: Diagnosis not present

## 2018-05-03 MED ORDER — AMLODIPINE BESYLATE 5 MG PO TABS: 5.0000 mg | ORAL_TABLET | Freq: Every day | ORAL | 11 refills | Status: DC

## 2018-05-03 NOTE — Patient Instructions (Signed)
Medication Instructions:   Begin Norvasc 5mg  every evening.  Continue all other medications.    Labwork: none  Testing/Procedures: none  Follow-Up: Your physician wants you to follow up in:  1 year.  You will receive a reminder letter in the mail one-two months in advance.  If you don't receive a letter, please call our office to schedule the follow up appointment - Dr. Rayann Heman.   Any Other Special Instructions Will Be Listed Below (If Applicable). Remote monitoring is used to monitor your Pacemaker of ICD from home. This monitoring reduces the number of office visits required to check your device to one time per year. It allows [place] to keep an eye on the functioning of your device to ensure it is working properly. You are scheduled for a device check from home on 06/11/2018. You may send your transmission at any time that day. If you have a wireless device, the transmission will be sent automatically. After your physician reviews your transmission, you will receive a postcard with your next transmission date.   If you need a refill on your cardiac medications before your next appointment, please call your pharmacy.

## 2018-05-03 NOTE — Progress Notes (Signed)
PCP: Monico Blitz, MD   Primary EP:  Dr Rayann Heman  [name] is a 77 y.o. male who presents today for routine electrophysiology followup.  Since last being seen in our clinic, the patient reports doing very well.  Today, he denies symptoms of palpitations, chest pain, shortness of breath,  lower extremity edema, dizziness, presyncope, or syncope.  The patient is otherwise without complaint today.   Past Medical History:  Diagnosis Date  . AAA (abdominal aortic aneurysm) ([place])   . COPD (chronic obstructive pulmonary disease) ([place])   . Coronary artery disease 2000   Status post CABG  . Diabetes mellitus    DM2  . Dyslipidemia   . Dyspnea   . History of kidney stones   . Hypertension   . Obesity   . Osteoarthritis   . Pacemaker   . Prostate cancer ([place])   . Sleep apnea    USING CPAP  . Spinal stenosis   . SSS (sick sinus syndrome) ([place]) 2000   s/p PPM   Past Surgical History:  Procedure Laterality Date  . BIOPSY PROSTATE  01/09/2018  . CATARACT EXTRACTION Left   . CORONARY ARTERY BYPASS GRAFT     2000  . JOINT REPLACEMENT     L hip Dr. Wynelle Link 07-18-17  . KNEE ARTHROPLASTY     BIL  . L5-S1 Gill decompressive laminectomy    . PACEMAKER INSERTION     MDT implanted for sick sinus syndrome  . PERMANENT PACEMAKER GENERATOR CHANGE N/A 03/30/2014   MDT Adapta L generator change by Dr Rayann Heman  . Right total hip arthroplasty.    [name] TOTAL HIP ARTHROPLASTY Left 07/18/2017   Procedure: LEFT TOTAL HIP ARTHROPLASTY ANTERIOR APPROACH;  Surgeon: [name], MD;  Location: [facility];  Service: Orthopedics;  Laterality: Left;  . TRANSRECTAL ULTRASOUND  01/09/2018    ROS- all systems are reviewed and negative except as per HPI above  Current Outpatient Medications  Medication Sig Dispense Refill  . aspirin EC 81 MG tablet Take 81 mg by mouth daily.    [name] atorvastatin (LIPITOR) 40 MG tablet Take 40 mg by mouth daily.    . Coenzyme Q10 (CO Q 10 PO) Take 200 mg by mouth daily.      . diclofenac (VOLTAREN) 75 MG EC tablet Take 75 mg by mouth 2 (two) times daily.    [name] glimepiride (AMARYL) 4 MG tablet Take 4 mg by mouth daily.     [name] ketorolac (ACULAR) 0.5 % ophthalmic solution instill ONE drop in right eye FOUR TIMES DAILY - start ONE WEEK prior TO surgery  4  . losartan (COZAAR) 100 MG tablet Take 100 mg by mouth daily.    . metoprolol succinate (TOPROL-XL) 50 MG 24 hr tablet metoprolol succinate ER 50 mg tablet,extended release 24 hr  TAKE 1 TABLET BY MOUTH IN THE MORNING AND TAKE 1/2 TABLET BY MOUTH IN THE EVENING    . Multiple Vitamins-Minerals (MULTIVITAMIN ADULTS PO) Take by mouth daily.    [name] ofloxacin (OCUFLOX) 0.3 % ophthalmic solution instill ONE drop in BOTH eyes FOUR TIMES DAILY - start 72 hours prior TO surgery  1  . Omega-3 Fatty Acids (FISH OIL) 1200 MG CAPS Take by mouth.    . pioglitazone (ACTOS) 30 MG tablet pioglitazone 30 mg tablet  TAKE 1/2 TO 1 TABLET BY MOUTH DAILY    . prednisoLONE acetate (PRED FORTE) 1 % ophthalmic suspension instill ONE drop in right eye FOUR TIMES DAILY -  start AFTER surgery  1   No current facility-administered medications for this visit.     Physical Exam: Vitals:   05/03/18 0959  BP: (!) 190/108  Pulse: 81  SpO2: 95%  Weight: 237 lb (107.5 kg)  Height: 5\' 7"  (1.702 m)    GEN- The patient is well appearing, alert and oriented x 3 today.   Head- normocephalic, atraumatic Eyes-  Sclera clear, conjunctiva pink Ears- hearing intact Oropharynx- clear Lungs- Clear to ausculation bilaterally, normal work of breathing Chest- pacemaker pocket is well healed Heart- Regular rate and rhythm, no murmurs, rubs or gallops, PMI not laterally displaced GI- soft, NT, ND, + BS Extremities- no clubbing, cyanosis, or edema  Pacemaker interrogation- reviewed in detail today,  See PACEART report   Assessment and Plan:  1. Symptomatic sinus bradycardia  Normal pacemaker function See Pace Art report No changes today RV lead is  unipolar.  He has some RV lead noise but this is stable and he does not V pace  2. HTN Very elevated today He reports frequent elevations in AM which improve by mid day Add norvasc 5mg  qpm which will hopefully improve am BP  3. CAD Stable No change required today Low risk myoview 06/07/17 reviewed with him again today  4. AAA Followed by vascular  5. Obesity Body mass index is 37.12 kg/m. Wt Readings from Last 3 Encounters:  05/03/18 237 lb (107.5 kg)  03/20/18 232 lb (105.2 kg)  03/14/18 237 lb (107.5 kg)    lifestyle modification encouarged  carelink Return in a year  Thompson Grayer MD, [facility] [place] 05/03/2018 10:27 AM

## 2018-05-05 LAB — CUP PACEART INCLINIC DEVICE CHECK
Brady Statistic AP VS Percent: 74 %
Brady Statistic AS VP Percent: 0 %
Brady Statistic AS VS Percent: 26 %
Implantable Lead Implant Date: 20000419
Implantable Lead Implant Date: 20000419
Implantable Lead Location: 753859
Implantable Lead Location: 753860
Implantable Lead Model: 4023
Lead Channel Impedance Value: 405 Ohm
Lead Channel Pacing Threshold Amplitude: 0.5 V
Lead Channel Pacing Threshold Pulse Width: 0.4 ms
Lead Channel Pacing Threshold Pulse Width: 0.4 ms
Lead Channel Sensing Intrinsic Amplitude: 8 mV
Lead Channel Setting Pacing Amplitude: 2 V
Lead Channel Setting Pacing Pulse Width: 0.4 ms
Lead Channel Setting Sensing Sensitivity: 2.8 mV
MDC IDC MSMT BATTERY IMPEDANCE: 203 Ohm
MDC IDC MSMT BATTERY REMAINING LONGEVITY: 119 mo
MDC IDC MSMT BATTERY VOLTAGE: 2.78 V
MDC IDC MSMT LEADCHNL RA IMPEDANCE VALUE: 351 Ohm
MDC IDC MSMT LEADCHNL RA PACING THRESHOLD AMPLITUDE: 0.75 V
MDC IDC MSMT LEADCHNL RA SENSING INTR AMPL: 2 mV
MDC IDC PG IMPLANT DT: 20150831
MDC IDC SESS DTM: 20191004145240
MDC IDC SET LEADCHNL RV PACING AMPLITUDE: 2.5 V
MDC IDC STAT BRADY AP VP PERCENT: 0 %

## 2018-05-08 ENCOUNTER — Telehealth: Payer: Self-pay | Admitting: *Deleted

## 2018-05-08 NOTE — Telephone Encounter (Signed)
Called patient to remind of pre-seed appts. for 05-09-18, spoke with patient and he is aware of these appts.

## 2018-05-09 ENCOUNTER — Ambulatory Visit
Admission: RE | Admit: 2018-05-09 | Discharge: 2018-05-09 | Disposition: A | Discharge status: Home / Self Care | Payer: Medicare Other | Source: Ambulatory Visit | Attending: Radiation Oncology | Admitting: Radiation Oncology

## 2018-05-09 ENCOUNTER — Encounter: Payer: Self-pay | Admitting: Medical Oncology

## 2018-05-09 ENCOUNTER — Ambulatory Visit (HOSPITAL_COMMUNITY)
Admission: RE | Admit: 2018-05-09 | Discharge: 2018-05-09 | Disposition: A | Discharge status: Home / Self Care | Payer: Medicare Other | Source: Ambulatory Visit | Attending: Urology | Admitting: Urology

## 2018-05-09 ENCOUNTER — Encounter (HOSPITAL_COMMUNITY)
Admission: RE | Admit: 2018-05-09 | Discharge: 2018-05-09 | Disposition: A | Discharge status: Home / Self Care | Payer: Medicare Other | Source: Ambulatory Visit | Attending: Urology | Admitting: Urology

## 2018-05-09 DIAGNOSIS — Z01811 Encounter for preprocedural respiratory examination: Secondary | ICD-10-CM | POA: Diagnosis not present

## 2018-05-09 DIAGNOSIS — J9811 Atelectasis: Secondary | ICD-10-CM | POA: Insufficient documentation

## 2018-05-09 DIAGNOSIS — C61 Malignant neoplasm of prostate: Secondary | ICD-10-CM

## 2018-05-09 NOTE — Progress Notes (Signed)
  Radiation Oncology         [phone]) [phone] ________________________________  Name: [name] MRN: [id number]  Date: 05/09/2018  DOB: 11/22/1940  SIMULATION AND TREATMENT PLANNING NOTE PUBIC ARCH STUDY  YI:FOYD, Sean Picking, MD  Cleon Gustin, MD  DIAGNOSIS: 77 y.o. male with Stage T2a adenocarcinoma of the prostate with Gleason Score of 4+3, and PSA of 6.9    ICD-10-CM   1. Malignant neoplasm of prostate ([place]) C61     COMPLEX SIMULATION:  The patient presented today for evaluation for possible prostate seed implant. He was brought to the radiation planning suite and placed supine on the CT couch. A 3-dimensional image study set was obtained in upload to the planning computer. There, on each axial slice, I contoured the prostate gland. Then, using three-dimensional radiation planning tools I reconstructed the prostate in view of the structures from the transperineal needle pathway to assess for possible pubic arch interference. In doing so, I did not appreciate any pubic arch interference. Also, the patient's prostate volume was estimated based on the drawn structure. The volume was 32.1 cc.  Given the pubic arch appearance and prostate volume, patient remains a good candidate to proceed with prostate seed implant. Today, he freely provided informed written consent to proceed.    PLAN: The patient will undergo prostate seed implant.   ________________________________  Sheral Apley. Tammi Klippel, M.D.  This document serves as a record of services personally performed by Sean Pita, MD. It was created on his behalf by Rae Lips, a trained medical scribe. The creation of this record is based on the scribe's personal observations and the provider's statements to them. This document has been checked and approved by the attending provider.

## 2018-05-21 ENCOUNTER — Other Ambulatory Visit: Payer: Self-pay | Admitting: Urology

## 2018-05-21 NOTE — Progress Notes (Signed)
Patient scheduled for brachytherapy with SpaceOAR gel placement 06/10/18.  NO post-procedure MRI due to implanted cardiac pacemaker.

## 2018-05-31 ENCOUNTER — Telehealth: Payer: Self-pay | Admitting: *Deleted

## 2018-05-31 NOTE — Patient Instructions (Addendum)
[name]  05/31/2018   Your procedure is scheduled on: 06-10-18    Report to [facility] Main  Entrance    Report to admitting at 11:00AM    Call this number if you have problems the morning of surgery [phone]    PLEASE BRING CPAP MASK AND  TUBING ONLY. DEVICE WILL BE PROVIDED!  PER YOUR SURGEON'S ORDERS. YOU  ARE TO DO A FLEETS ENEMA THE MORNING OF SURGERY!    Remember: Do not eat food After Midnight. YOU MAY HAVE CLEAR LIQUIDS FROM MIDNIGHT UNTIL 7:00AM. NOTHING BY MOUTH AFTER 7:00AM! BRUSH YOUR TEETH MORNING OF SURGERY AND RINSE YOUR MOUTH OUT, NO CHEWING GUM CANDY OR MINTS.    CLEAR LIQUID DIET   Foods Allowed                                                                     Foods Excluded  Coffee and tea, regular and decaf                             liquids that you cannot  Plain Jell-O in any flavor                                             see through such as: Fruit ices (not with fruit pulp)                                     milk, soups, orange juice  Iced Popsicles                                    All solid food Carbonated beverages, regular and diet                                    Cranberry, grape and apple juices Sports drinks like Gatorade Lightly seasoned clear broth or consume(fat free) Sugar, honey syrup  Sample Menu Breakfast                                Lunch                                     Supper Cranberry juice                    Beef broth                            Chicken broth Jell-O  Grape juice                           Apple juice Coffee or tea                        Jell-O                                      Popsicle                                                Coffee or tea                        Coffee or tea  _____________________________________________________________________       Take these medicines the morning of surgery with A SIP OF WATER:  metoprolol, atorvastatin   DO NOT TAKE ANY DIABETIC MEDICATIONS DAY OF YOUR SURGERY!!!                                You may not have any metal on your body including hair pins and              piercings  Do not wear jewelry, make-up, lotions, powders or perfumes, deodorant                      Men may shave face and neck.   Do not bring valuables to the hospital. [place].  Contacts, dentures or bridgework may not be worn into surgery.       Patients discharged the day of surgery will not be allowed to drive home.  Name and phone number of your driver:  Special Instructions: N/A              Please read over the following fact sheets you were given: _____________________________________________________________________             [facility] - Preparing for Surgery Before surgery, you can play an important role.  Because skin is not sterile, your skin needs to be as free of germs as possible.  You can reduce the number of germs on your skin by washing with CHG (chlorahexidine gluconate) soap before surgery.  CHG is an antiseptic cleaner which kills germs and bonds with the skin to continue killing germs even after washing. Please DO NOT use if you have an allergy to CHG or antibacterial soaps.  If your skin becomes reddened/irritated stop using the CHG and inform your nurse when you arrive at Short Stay. Do not shave (including legs and underarms) for at least 48 hours prior to the first CHG shower.  You may shave your face/neck. Please follow these instructions carefully:  1.  Shower with CHG Soap the night before surgery and the  morning of Surgery.  2.  If you choose to wash your hair, wash your hair first as usual with your  normal  shampoo.  3.  After you shampoo, rinse your hair and body thoroughly to remove the  shampoo.                           4.  Use CHG as you would any other liquid soap.  You can apply chg directly  to  the skin and wash                       Gently with a scrungie or clean washcloth.  5.  Apply the CHG Soap to your body ONLY FROM THE NECK DOWN.   Do not use on face/ open                           Wound or open sores. Avoid contact with eyes, ears mouth and genitals (private parts).                       Wash face,  Genitals (private parts) with your normal soap.             6.  Wash thoroughly, paying special attention to the area where your surgery  will be performed.  7.  Thoroughly rinse your body with warm water from the neck down.  8.  DO NOT shower/wash with your normal soap after using and rinsing off  the CHG Soap.                9.  Pat yourself dry with a clean towel.            10.  Wear clean pajamas.            11.  Place clean sheets on your bed the night of your first shower and do not  sleep with pets. Day of Surgery : Do not apply any lotions/deodorants the morning of surgery.  Please wear clean clothes to the hospital/surgery center.  FAILURE TO FOLLOW THESE INSTRUCTIONS MAY RESULT IN THE CANCELLATION OF YOUR SURGERY PATIENT SIGNATURE_________________________________  NURSE SIGNATURE__________________________________  ________________________________________________________________________

## 2018-05-31 NOTE — Telephone Encounter (Signed)
Called patient to remind of preadmission testing on 06-03-18 @ 1:30 pm, spoke with patient and he is aware of this appt.

## 2018-05-31 NOTE — Progress Notes (Signed)
Cardiac clearance , see tele note 04-17-18 epic   [place] [facility] 05-03-18 epic   Last PPM device check 05-03-18 epic   EKG 03-20-18 epic   CT abd 03-07-18 epic    LOV vasc surg 01-17-18 epic

## 2018-06-03 ENCOUNTER — Encounter (HOSPITAL_COMMUNITY)
Admission: RE | Admit: 2018-06-03 | Discharge: 2018-06-03 | Disposition: A | Discharge status: Home / Self Care | Payer: Medicare Other | Source: Ambulatory Visit | Attending: Urology | Admitting: Urology

## 2018-06-03 ENCOUNTER — Encounter (INDEPENDENT_AMBULATORY_CARE_PROVIDER_SITE_OTHER): Payer: Self-pay

## 2018-06-03 ENCOUNTER — Encounter (HOSPITAL_COMMUNITY): Payer: Self-pay

## 2018-06-03 ENCOUNTER — Other Ambulatory Visit: Payer: Self-pay

## 2018-06-03 DIAGNOSIS — C61 Malignant neoplasm of prostate: Secondary | ICD-10-CM | POA: Diagnosis not present

## 2018-06-03 DIAGNOSIS — Z01812 Encounter for preprocedural laboratory examination: Secondary | ICD-10-CM | POA: Insufficient documentation

## 2018-06-03 LAB — HEMOGLOBIN A1C
Hgb A1c MFr Bld: 6.6 % — ABNORMAL HIGH (ref 4.8–5.6)
Mean Plasma Glucose: 142.72 mg/dL

## 2018-06-03 LAB — SURGICAL PCR SCREEN
MRSA, PCR: NEGATIVE
STAPHYLOCOCCUS AUREUS: NEGATIVE

## 2018-06-03 LAB — CBC
HEMATOCRIT: 41.4 % (ref 39.0–52.0)
HEMOGLOBIN: 13.2 g/dL (ref 13.0–17.0)
MCH: 30.6 pg (ref 26.0–34.0)
MCHC: 31.9 g/dL (ref 30.0–36.0)
MCV: 95.8 fL (ref 80.0–100.0)
Platelets: 159 10*3/uL (ref 150–400)
RBC: 4.32 MIL/uL (ref 4.22–5.81)
RDW: 14.1 % (ref 11.5–15.5)
WBC: 6.5 10*3/uL (ref 4.0–10.5)
nRBC: 0 % (ref 0.0–0.2)

## 2018-06-03 LAB — BASIC METABOLIC PANEL
Anion gap: 9 (ref 5–15)
BUN: 38 mg/dL — AB (ref 8–23)
CO2: 24 mmol/L (ref 22–32)
Calcium: 9.4 mg/dL (ref 8.9–10.3)
Chloride: 109 mmol/L (ref 98–111)
Creatinine, Ser: 1.85 mg/dL — ABNORMAL HIGH (ref 0.61–1.24)
GFR calc Af Amer: 39 mL/min — ABNORMAL LOW (ref >60)
GFR calc non Af Amer: 33 mL/min — ABNORMAL LOW (ref >60)
GLUCOSE: 117 mg/dL — AB (ref 70–99)
POTASSIUM: 4.5 mmol/L (ref 3.5–5.1)
Sodium: 142 mmol/L (ref 135–145)

## 2018-06-03 LAB — GLUCOSE, CAPILLARY: Glucose-Capillary: 111 mg/dL — ABNORMAL HIGH (ref 70–99)

## 2018-06-03 NOTE — Progress Notes (Signed)
Bmp routed via epic to Dr Mamie Nick. Alyson Ingles

## 2018-06-04 NOTE — Pre-Procedure Instructions (Signed)
Hgb A 1C 06/03/2018 sent to Dr. Alyson Ingles via epic.

## 2018-06-07 ENCOUNTER — Telehealth: Payer: Self-pay | Admitting: *Deleted

## 2018-06-07 NOTE — Telephone Encounter (Signed)
Called patient to remind of procedure for 06-10-18, spoke with patient and he is aware of this procedure.

## 2018-06-10 ENCOUNTER — Ambulatory Visit (HOSPITAL_COMMUNITY): Payer: Medicare Other

## 2018-06-10 ENCOUNTER — Ambulatory Visit (HOSPITAL_COMMUNITY): Payer: Medicare Other | Admitting: Anesthesiology

## 2018-06-10 ENCOUNTER — Encounter (HOSPITAL_COMMUNITY): Payer: Self-pay

## 2018-06-10 ENCOUNTER — Encounter (HOSPITAL_COMMUNITY)
Admission: RE | Disposition: A | Discharge status: Home / Self Care | Payer: Self-pay | Source: Ambulatory Visit | Attending: Urology

## 2018-06-10 ENCOUNTER — Other Ambulatory Visit: Payer: Self-pay

## 2018-06-10 ENCOUNTER — Ambulatory Visit (HOSPITAL_COMMUNITY)
Admission: RE | Admit: 2018-06-10 | Discharge: 2018-06-10 | Disposition: A | Discharge status: Home / Self Care | Payer: Medicare Other | Source: Ambulatory Visit | Attending: Urology | Admitting: Urology

## 2018-06-10 DIAGNOSIS — I714 Abdominal aortic aneurysm, without rupture: Secondary | ICD-10-CM | POA: Diagnosis not present

## 2018-06-10 DIAGNOSIS — G4730 Sleep apnea, unspecified: Secondary | ICD-10-CM | POA: Diagnosis not present

## 2018-06-10 DIAGNOSIS — I2510 Atherosclerotic heart disease of native coronary artery without angina pectoris: Secondary | ICD-10-CM | POA: Insufficient documentation

## 2018-06-10 DIAGNOSIS — I739 Peripheral vascular disease, unspecified: Secondary | ICD-10-CM | POA: Diagnosis not present

## 2018-06-10 DIAGNOSIS — E119 Type 2 diabetes mellitus without complications: Secondary | ICD-10-CM | POA: Diagnosis not present

## 2018-06-10 DIAGNOSIS — Z7984 Long term (current) use of oral hypoglycemic drugs: Secondary | ICD-10-CM | POA: Insufficient documentation

## 2018-06-10 DIAGNOSIS — C61 Malignant neoplasm of prostate: Secondary | ICD-10-CM | POA: Insufficient documentation

## 2018-06-10 DIAGNOSIS — I10 Essential (primary) hypertension: Secondary | ICD-10-CM | POA: Insufficient documentation

## 2018-06-10 DIAGNOSIS — Z96642 Presence of left artificial hip joint: Secondary | ICD-10-CM | POA: Insufficient documentation

## 2018-06-10 DIAGNOSIS — Z6837 Body mass index (BMI) 37.0-37.9, adult: Secondary | ICD-10-CM | POA: Diagnosis not present

## 2018-06-10 DIAGNOSIS — Z87891 Personal history of nicotine dependence: Secondary | ICD-10-CM | POA: Insufficient documentation

## 2018-06-10 DIAGNOSIS — Z9989 Dependence on other enabling machines and devices: Secondary | ICD-10-CM | POA: Insufficient documentation

## 2018-06-10 DIAGNOSIS — Z951 Presence of aortocoronary bypass graft: Secondary | ICD-10-CM | POA: Diagnosis not present

## 2018-06-10 DIAGNOSIS — Z950 Presence of cardiac pacemaker: Secondary | ICD-10-CM | POA: Insufficient documentation

## 2018-06-10 DIAGNOSIS — J449 Chronic obstructive pulmonary disease, unspecified: Secondary | ICD-10-CM | POA: Diagnosis not present

## 2018-06-10 DIAGNOSIS — Z96653 Presence of artificial knee joint, bilateral: Secondary | ICD-10-CM | POA: Insufficient documentation

## 2018-06-10 HISTORY — PX: RADIOACTIVE SEED IMPLANT: SHX5150

## 2018-06-10 HISTORY — PX: SPACE OAR INSTILLATION: SHX6769

## 2018-06-10 LAB — CBC
HEMATOCRIT: 40.2 % (ref 39.0–52.0)
HEMOGLOBIN: 13.1 g/dL (ref 13.0–17.0)
MCH: 31 pg (ref 26.0–34.0)
MCHC: 32.6 g/dL (ref 30.0–36.0)
MCV: 95.3 fL (ref 80.0–100.0)
NRBC: 0 % (ref 0.0–0.2)
Platelets: 162 10*3/uL (ref 150–400)
RBC: 4.22 MIL/uL (ref 4.22–5.81)
RDW: 14 % (ref 11.5–15.5)
WBC: 6.5 10*3/uL (ref 4.0–10.5)

## 2018-06-10 LAB — COMPREHENSIVE METABOLIC PANEL
ALK PHOS: 98 U/L (ref 38–126)
ALT: 23 U/L (ref 0–44)
AST: 33 U/L (ref 15–41)
Albumin: 4.3 g/dL (ref 3.5–5.0)
Anion gap: 10 (ref 5–15)
BILIRUBIN TOTAL: 1.3 mg/dL — AB (ref 0.3–1.2)
BUN: 45 mg/dL — AB (ref 8–23)
CALCIUM: 9.5 mg/dL (ref 8.9–10.3)
CO2: 23 mmol/L (ref 22–32)
Chloride: 105 mmol/L (ref 98–111)
Creatinine, Ser: 2.02 mg/dL — ABNORMAL HIGH (ref 0.61–1.24)
GFR, EST AFRICAN AMERICAN: 35 mL/min — AB (ref >60)
GFR, EST NON AFRICAN AMERICAN: 30 mL/min — AB (ref >60)
Glucose, Bld: 143 mg/dL — ABNORMAL HIGH (ref 70–99)
Potassium: 4.4 mmol/L (ref 3.5–5.1)
Sodium: 138 mmol/L (ref 135–145)
Total Protein: 7.4 g/dL (ref 6.5–8.1)

## 2018-06-10 LAB — PROTIME-INR
INR: 1
PROTHROMBIN TIME: 13.1 s (ref 11.4–15.2)

## 2018-06-10 LAB — APTT: aPTT: 32 seconds (ref 24–36)

## 2018-06-10 LAB — GLUCOSE, CAPILLARY: Glucose-Capillary: 131 mg/dL — ABNORMAL HIGH (ref 70–99)

## 2018-06-10 SURGERY — INSERTION, RADIATION SOURCE, PROSTATE
Anesthesia: General

## 2018-06-10 MED ORDER — FENTANYL CITRATE (PF) 100 MCG/2ML IJ SOLN
INTRAMUSCULAR | Status: DC | PRN
  Administered 2018-06-10 (×4): 50 ug via INTRAVENOUS

## 2018-06-10 MED ORDER — STERILE WATER FOR IRRIGATION IR SOLN
Status: DC | PRN
  Administered 2018-06-10: 3000 mL

## 2018-06-10 MED ORDER — PHENYLEPHRINE 40 MCG/ML (10ML) SYRINGE FOR IV PUSH (FOR BLOOD PRESSURE SUPPORT)
PREFILLED_SYRINGE | INTRAVENOUS | Status: DC | PRN
  Administered 2018-06-10: 40 ug via INTRAVENOUS
  Administered 2018-06-10: 80 ug via INTRAVENOUS

## 2018-06-10 MED ORDER — ONDANSETRON HCL 4 MG/2ML IJ SOLN
INTRAMUSCULAR | Status: AC
  Filled 2018-06-10: qty 2

## 2018-06-10 MED ORDER — EPHEDRINE 5 MG/ML INJ
INTRAVENOUS | Status: AC
  Filled 2018-06-10: qty 10

## 2018-06-10 MED ORDER — PROPOFOL 10 MG/ML IV BOLUS
INTRAVENOUS | Status: AC
  Filled 2018-06-10: qty 20

## 2018-06-10 MED ORDER — CEFAZOLIN SODIUM-DEXTROSE 2-4 GM/100ML-% IV SOLN
2.0000 g | Freq: Once | INTRAVENOUS | Status: AC
  Administered 2018-06-10: 2 g via INTRAVENOUS
  Filled 2018-06-10: qty 100

## 2018-06-10 MED ORDER — IOHEXOL 300 MG/ML  SOLN
INTRAMUSCULAR | Status: DC | PRN
  Administered 2018-06-10: 5 mL

## 2018-06-10 MED ORDER — EPHEDRINE SULFATE-NACL 50-0.9 MG/10ML-% IV SOSY
PREFILLED_SYRINGE | INTRAVENOUS | Status: DC | PRN
  Administered 2018-06-10 (×2): 5 mg via INTRAVENOUS

## 2018-06-10 MED ORDER — LACTATED RINGERS IV SOLN
INTRAVENOUS | Status: DC
  Administered 2018-06-10 (×2): via INTRAVENOUS

## 2018-06-10 MED ORDER — PHENYLEPHRINE 40 MCG/ML (10ML) SYRINGE FOR IV PUSH (FOR BLOOD PRESSURE SUPPORT)
PREFILLED_SYRINGE | INTRAVENOUS | Status: AC
  Filled 2018-06-10: qty 10

## 2018-06-10 MED ORDER — ROCURONIUM BROMIDE 10 MG/ML (PF) SYRINGE
PREFILLED_SYRINGE | INTRAVENOUS | Status: DC | PRN
  Administered 2018-06-10 (×2): 10 mg via INTRAVENOUS
  Administered 2018-06-10: 50 mg via INTRAVENOUS

## 2018-06-10 MED ORDER — LIDOCAINE 2% (20 MG/ML) 5 ML SYRINGE
INTRAMUSCULAR | Status: AC
  Filled 2018-06-10: qty 5

## 2018-06-10 MED ORDER — FLEET ENEMA 7-19 GM/118ML RE ENEM
1.0000 | ENEMA | Freq: Once | RECTAL | Status: DC
  Filled 2018-06-10: qty 1

## 2018-06-10 MED ORDER — SUGAMMADEX SODIUM 500 MG/5ML IV SOLN
INTRAVENOUS | Status: AC
  Filled 2018-06-10: qty 5

## 2018-06-10 MED ORDER — DEXAMETHASONE SODIUM PHOSPHATE 10 MG/ML IJ SOLN
INTRAMUSCULAR | Status: AC
  Filled 2018-06-10: qty 1

## 2018-06-10 MED ORDER — FENTANYL CITRATE (PF) 100 MCG/2ML IJ SOLN: 25.0000 ug | INTRAMUSCULAR | Status: DC | PRN

## 2018-06-10 MED ORDER — 0.9 % SODIUM CHLORIDE (POUR BTL) OPTIME
TOPICAL | Status: DC | PRN
  Administered 2018-06-10: 1000 mL

## 2018-06-10 MED ORDER — FENTANYL CITRATE (PF) 100 MCG/2ML IJ SOLN
INTRAMUSCULAR | Status: AC
  Filled 2018-06-10: qty 2

## 2018-06-10 MED ORDER — DEXAMETHASONE SODIUM PHOSPHATE 10 MG/ML IJ SOLN
INTRAMUSCULAR | Status: DC | PRN
  Administered 2018-06-10: 10 mg via INTRAVENOUS

## 2018-06-10 MED ORDER — ONDANSETRON HCL 4 MG/2ML IJ SOLN
INTRAMUSCULAR | Status: DC | PRN
  Administered 2018-06-10: 4 mg via INTRAVENOUS

## 2018-06-10 MED ORDER — OXYCODONE HCL 5 MG PO TABS
5.0000 mg | ORAL_TABLET | Freq: Once | ORAL | Status: AC
  Administered 2018-06-10: 5 mg via ORAL

## 2018-06-10 MED ORDER — OXYCODONE HCL 5 MG PO TABS
ORAL_TABLET | ORAL | Status: AC
  Filled 2018-06-10: qty 1

## 2018-06-10 MED ORDER — LABETALOL HCL 5 MG/ML IV SOLN: 5.0000 mg | INTRAVENOUS | Status: DC | PRN

## 2018-06-10 MED ORDER — SUGAMMADEX SODIUM 500 MG/5ML IV SOLN
INTRAVENOUS | Status: DC | PRN
  Administered 2018-06-10: 225 mg via INTRAVENOUS

## 2018-06-10 MED ORDER — LIDOCAINE 2% (20 MG/ML) 5 ML SYRINGE
INTRAMUSCULAR | Status: DC | PRN
  Administered 2018-06-10: 100 mg via INTRAVENOUS

## 2018-06-10 MED ORDER — ROCURONIUM BROMIDE 10 MG/ML (PF) SYRINGE
PREFILLED_SYRINGE | INTRAVENOUS | Status: AC
  Filled 2018-06-10: qty 10

## 2018-06-10 MED ORDER — PROPOFOL 10 MG/ML IV BOLUS
INTRAVENOUS | Status: DC | PRN
  Administered 2018-06-10: 120 mg via INTRAVENOUS

## 2018-06-10 MED ORDER — TRAMADOL HCL 50 MG PO TABS: 50.0000 mg | ORAL_TABLET | Freq: Four times a day (QID) | ORAL | 0 refills | Status: DC | PRN

## 2018-06-10 SURGICAL SUPPLY — 24 items
BAG URINE DRAINAGE (UROLOGICAL SUPPLIES) ×5 IMPLANT
CATH FOLEY 2WAY SLVR  5CC 16FR (CATHETERS) ×4
CATH FOLEY 2WAY SLVR 5CC 16FR (CATHETERS) ×2 IMPLANT
CATH ROBINSON RED A/P 20FR (CATHETERS) ×3 IMPLANT
CONT SPECI 4OZ STER CLIK (MISCELLANEOUS) ×6 IMPLANT
COVER BACK TABLE 60X90IN (DRAPES) ×3 IMPLANT
COVER MAYO STAND STRL (DRAPES) ×3 IMPLANT
COVER SURGICAL LIGHT HANDLE (MISCELLANEOUS) ×1 IMPLANT
COVER WAND RF STERILE (DRAPES) IMPLANT
DRSG TEGADERM 4X4.75 (GAUZE/BANDAGES/DRESSINGS) ×3 IMPLANT
DRSG TEGADERM 8X12 (GAUZE/BANDAGES/DRESSINGS) ×5 IMPLANT
GAUZE SPONGE 4X4 12PLY STRL (GAUZE/BANDAGES/DRESSINGS) ×2 IMPLANT
GLOVE BIOGEL M STRL SZ7.5 (GLOVE) ×4 IMPLANT
GOWN STRL REUS W/TWL LRG LVL3 (GOWN DISPOSABLE) ×4 IMPLANT
HOLDER FOLEY CATH W/STRAP (MISCELLANEOUS) ×3 IMPLANT
IMPL SPACEOAR SYSTEM 10ML (Spacer) IMPLANT
IMPLANT SPACEOAR SYSTEM 10ML (Spacer) ×3 IMPLANT
MARKER SKIN DUAL TIP RULER LAB (MISCELLANEOUS) ×1 IMPLANT
PACK CYSTO (CUSTOM PROCEDURE TRAY) ×3 IMPLANT
SYR 10ML LL (SYRINGE) ×3 IMPLANT
TOWEL OR 17X26 10 PK STRL BLUE (TOWEL DISPOSABLE) ×3 IMPLANT
UNDERPAD 30X30 (UNDERPADS AND DIAPERS) ×5 IMPLANT
WATER STERILE IRR 250ML POUR (IV SOLUTION) ×2 IMPLANT
i-seed agx100 ×2 IMPLANT

## 2018-06-10 NOTE — Anesthesia Preprocedure Evaluation (Addendum)
Anesthesia Evaluation  Patient identified by MRN, date of birth, ID band Patient awake    Reviewed: Allergy & Precautions, NPO status , Patient's Chart, lab work & pertinent test results, reviewed documented beta blocker date and time   History of Anesthesia Complications Negative for: history of anesthetic complications  Airway Mallampati: II  TM Distance: >3 FB Neck ROM: Full    Dental  (+) Dental Advisory Given   Pulmonary sleep apnea and Continuous Positive Airway Pressure Ventilation , former smoker (quit 2000),    breath sounds clear to auscultation       Cardiovascular hypertension, Pt. on medications and Pt. on home beta blockers (-) angina+ CAD, + CABG and + Peripheral Vascular Disease (AAA)  + pacemaker (SSS)  Rhythm:Regular Rate:Normal  '18 Stress: No diagnostic ST segment changes to indicate ischemia, Small, moderate intensity, apical to basal inferolateral defect that is partially reversible and suggestive of scar with mild peri-infarct ischemia, low risk study. EF: 52%.  '13 ECHO: EF 50-55%, valves OK   Neuro/Psych negative neurological ROS  negative psych ROS   GI/Hepatic negative GI ROS, Neg liver ROS,   Endo/Other  diabetes (glu 131), Oral Hypoglycemic AgentsMorbid obesity  Renal/GU Renal InsufficiencyRenal disease (creat 1.85)   Prostate cancer    Musculoskeletal  (+) Arthritis ,   Abdominal (+) + obese,   Peds  Hematology negative hematology ROS (+)   Anesthesia Other Findings   Reproductive/Obstetrics                            Anesthesia Physical Anesthesia Plan  ASA: III  Anesthesia Plan: General   Post-op Pain Management:    Induction: Intravenous  PONV Risk Score and Plan: 2 and Ondansetron and Dexamethasone  Airway Management Planned: LMA  Additional Equipment:   Intra-op Plan:   Post-operative Plan:   Informed Consent: I have reviewed the patients  History and Physical, chart, labs and discussed the procedure including the risks, benefits and alternatives for the proposed anesthesia with the patient or authorized representative who has indicated his/her understanding and acceptance.   Dental advisory given  Plan Discussed with: CRNA and Surgeon  Anesthesia Plan Comments: (Plan routine monitors, GA- LMA OK)        Anesthesia Quick Evaluation

## 2018-06-10 NOTE — Discharge Instructions (Signed)
°Brachytherapy for Prostate Cancer, Care After °Refer to this sheet in the next few weeks. These instructions provide you with information on caring for yourself after your procedure. Your health care provider may also give you more specific instructions. Your treatment has been planned according to current medical practices, but problems sometimes occur. Call your health care provider if you have any problems or questions after your procedure. °What can I expect after the procedure? °The area behind the scrotum will probably be tender and bruised. For a short period of time you may have: °· Difficulty passing urine. You may need a catheter for a few days to a month. °· Blood in the urine or semen. °· A feeling of constipation because of prostate swelling. °· Frequent feeling of an urgent need to urinate. ° °For a long period of time you may have: °· Inflammation of the rectum. This happens in about 2% of people who have the procedure. °· Erection problems. These vary with age and occur in about 15-40% of men. °· Difficulty urinating. This is caused by scarring in the urethra. °· Diarrhea. ° °Follow these instructions at home: °· Take medicines only as directed by your health care provider. °· You will probably have a catheter in your bladder for several days. You will have blood in the urine bag and should drink a lot of fluids to keep it a light red color. °· Keep all follow-up visits as directed by your health care provider. If you have a catheter, it will be removed during one of these visits. °· Try not to sit directly on the area behind the scrotum. A soft cushion can decrease the discomfort. Ice packs may also be helpful for the discomfort. Do not put ice directly on the skin. °· Shower and wash the area behind the scrotum gently. Do not sit in a tub. °· If you have had the brachytherapy that uses the seeds, limit your close contact with children and pregnant women for 2 months because of the radiation still  in the prostate. After that period of time, the levels drop off quickly. °Get help right away if: °· You have a fever. °· You have chills. °· You have shortness of breath. °· You have chest pain. °· You have thick blood, like tomato juice, in the urine bag. °· Your catheter is blocked so urine cannot get into the bag. Your bladder area or lower abdomen may be swollen. °· There is excessive bleeding from your rectum. It is normal to have a little blood mixed with your stool. °· There is severe discomfort in the treated area that does not go away with pain medicine. °· You have abdominal discomfort. °· You have severe nausea or vomiting. °· You develop any new or unusual symptoms. °This information is not intended to replace advice given to you by your health care provider. Make sure you discuss any questions you have with your health care provider. °Document Released: 08/19/2010 Document Revised: 12/29/2015 Document Reviewed: 01/07/2013 °Elsevier Interactive Patient Education © 2017 [employer] °Indwelling Urinary Catheter Care, Adult °Take good care of your catheter to keep it working and to prevent problems. °How to wear your catheter °Attach your catheter to your leg with tape (adhesive tape) or a leg strap. Make sure it is not too tight. If you use tape, remove any bits of tape that are already on the catheter. °How to wear a drainage bag °You should have: °· A large overnight bag. °· A small leg   bag. ° °Overnight Bag °You may wear the overnight bag at any time. Always keep the bag below the level of your bladder but off the floor. When you sleep, put a clean plastic bag in a wastebasket. Then hang the bag inside the wastebasket. °Leg Bag °Never wear the leg bag at night. Always wear the leg bag below your knee. Keep the leg bag secure with a leg strap or tape. °How to care for your skin °· Clean the skin around the catheter at least once every day. °· Shower every day. Do not take baths. °· Put creams,  lotions, or ointments on your genital area only as told by your doctor. °· Do not use powders, sprays, or lotions on your genital area. °How to clean your catheter and your skin °1. Wash your hands with soap and water. °2. Wet a washcloth in warm water and gentle (mild) soap. °3. Use the washcloth to clean the skin where the catheter enters your body. Clean downward and wipe away from the catheter in small circles. Do not wipe toward the catheter. °4. Pat the area dry with a clean towel. Make sure to clean off all soap. °How to care for your drainage bags °Empty your drainage bag when it is ?-½ full or at least 2-3 times a day. Replace your drainage bag once a month or sooner if it starts to smell bad or look dirty. Do not clean your drainage bag unless told by your doctor. °Emptying a drainage bag ° °Supplies Needed °· Rubbing alcohol. °· Gauze pad or cotton ball. °· Tape or a leg strap. ° °Steps °1. Wash your hands with soap and water. °2. Separate (detach) the bag from your leg. °3. Hold the bag over the toilet or a clean container. Keep the bag below your hips and bladder. This stops pee (urine) from going back into the tube. °4. Open the pour spout at the bottom of the bag. °5. Empty the pee into the toilet or container. Do not let the pour spout touch any surface. °6. Put rubbing alcohol on a gauze pad or cotton ball. °7. Use the gauze pad or cotton ball to clean the pour spout. °8. Close the pour spout. °9. Attach the bag to your leg with tape or a leg strap. °10. Wash your hands. ° °Changing a drainage bag °Supplies Needed °· Alcohol wipes. °· A clean drainage bag. °· Adhesive tape or a leg strap. ° °Steps °1. Wash your hands with soap and water. °2. Separate the dirty bag from your leg. °3. Pinch the rubber catheter with your fingers so that pee does not spill out. °4. Separate the catheter tube from the drainage tube where these tubes connect (at the connection valve). Do not let the tubes touch any  surface. °5. Clean the end of the catheter tube with an alcohol wipe. Use a different alcohol wipe to clean the end of the drainage tube. °6. Connect the catheter tube to the drainage tube of the clean bag. °7. Attach the new bag to the leg with adhesive tape or a leg strap. °8. Wash your hands. ° °How to prevent infection and other problems °· Never pull on your catheter or try to remove it. Pulling can damage tissue in your body. °· Always wash your hands before and after touching your catheter. °· If a leg strap gets wet, replace it with a dry one. °· Drink enough fluids to keep your pee clear or pale yellow, or as   told by your doctor. °· Do not let the drainage bag or tubing touch the floor. °· Wear cotton underwear. °· If you are male, wipe from front to back after you poop (have a bowel movement). °· Check on the catheter often to make sure it works and the tubing is not twisted. °Get help if: °· Your pee is cloudy. °· Your pee smells unusually bad. °· Your pee is not draining into the bag. °· Your tube gets clogged. °· Your catheter starts to leak. °· Your bladder feels full. °Get help right away if: °· You have redness, swelling, or pain where the catheter enters your body. °· You have fluid, pus, or a bad smell coming from the area where the catheter enters your body. °· The area where the catheter enters your body feels warm. °· You have a fever. °· You have pain in your: °? Stomach (abdomen). °? Legs. °? Lower back. °? Bladder. °· You see blood fill the catheter. °· Your pee is pink or red. °· You feel sick to your stomach (nauseous). °· You throw up (vomit). °· You have chills. °· Your catheter gets pulled out. °This information is not intended to replace advice given to you by your health care provider. Make sure you discuss any questions you have with your health care provider. °Document Released: 11/11/2012 Document Revised: 06/14/2016 Document Reviewed: 12/30/2013 °Elsevier Interactive Patient  Education © 2018 Elsevier Inc. ° °

## 2018-06-10 NOTE — H&P (Signed)
Urology Admission H&P  Chief Complaint: prostate cancer  History of Present Illness: [name] is a 77yo with a hx of intermediate risk prostate cancer here for brachytherapy. He has mild LUTS. No hematuria. No fevers/chills/sweats. No nausea/vomiting  Past Medical History:  Diagnosis Date  . AAA (abdominal aortic aneurysm) ([place])   . COPD (chronic obstructive pulmonary disease) ([place])   . Coronary artery disease 2000   Status post CABG  . Diabetes mellitus    DM2  . Dyslipidemia   . Dyspnea   . History of kidney stones   . Hypertension   . Obesity   . Osteoarthritis   . Pacemaker   . Prostate cancer ([place])   . Sleep apnea    USING CPAP  . Spinal stenosis   . SSS (sick sinus syndrome) ([place]) 2000   s/p PPM   Past Surgical History:  Procedure Laterality Date  . BIOPSY PROSTATE  01/09/2018  . CATARACT EXTRACTION Left   . CORONARY ARTERY BYPASS GRAFT     2000  . JOINT REPLACEMENT     L hip Dr. Wynelle Link 07-18-17  . L5-S1 Gill decompressive laminectomy    . PACEMAKER INSERTION     MDT implanted for sick sinus syndrome  . PERMANENT PACEMAKER GENERATOR CHANGE N/A 03/30/2014   MDT Adapta L generator change by Dr Rayann Heman  . REPLACEMENT TOTAL KNEE BILATERAL     BIL  . Right total hip arthroplasty.    [name] TOTAL HIP ARTHROPLASTY Left 07/18/2017   Procedure: LEFT TOTAL HIP ARTHROPLASTY ANTERIOR APPROACH;  Surgeon: [name], MD;  Location: [facility] ORS;  Service: Orthopedics;  Laterality: Left;  . TRANSRECTAL ULTRASOUND  01/09/2018    Home Medications:  Current Facility-Administered Medications  Medication Dose Route Frequency Provider Last Rate Last Dose  . ceFAZolin (ANCEF) IVPB 2g/100 mL premix  2 g Intravenous Once Cleon Gustin, MD      . lactated ringers infusion   Intravenous Continuous Lidia Collum, MD 50 mL/hr at 06/10/18 1146    . [START ON 06/11/2018] sodium phosphate (FLEET) 7-19 GM/118ML enema 1 enema  1 enema Rectal Once Arlanda Shiplett, Candee Furbish, MD        Allergies: No Known Allergies  Family History  Problem Relation Age of Onset  . CAD Father   . Stroke Father        Deceased, 7  . Hypertension Mother   . Stroke Mother        Deceased, 78  . Heart disease Mother   . Stroke Brother        Deceased, 47  . Hypertension Sister        Deceased, 54  . Heart disease Sister   . Healthy Son   . Breast cancer Daughter   . Prostate cancer Neg Hx   . Colon cancer Neg Hx   . Pancreatic cancer Neg Hx    Social History:  reports that he quit smoking about 19 years ago. His smoking use included cigarettes. He started smoking about 57 years ago. He has a 42.00 pack-year smoking history. He has never used smokeless tobacco. He reports that he does not drink alcohol or use drugs.  Review of Systems  All other systems reviewed and are negative.   Physical Exam:  Vital signs in last 24 hours: Temp:  [97.9 F (36.6 C)] 97.9 F (36.6 C) (11/11 1123) Pulse Rate:  [69] 69 (11/11 1123) Resp:  [20] 20 (11/11 1123) BP: (151)/(80) 151/80 (11/11 1123) SpO2:  [95 %]  95 % (11/11 1123) Weight:  [106.1 kg] 106.1 kg (11/11 1120) Physical Exam  Constitutional: He is oriented to person, place, and time. He appears well-developed and well-nourished.  HENT:  Head: Normocephalic and atraumatic.  Eyes: Pupils are equal, round, and reactive to light. EOM are normal.  Neck: Normal range of motion. No thyromegaly present.  Cardiovascular: Normal rate and regular rhythm.  Respiratory: Effort normal. No respiratory distress.  GI: Soft. He exhibits no distension.  Musculoskeletal: Normal range of motion. He exhibits no edema.  Neurological: He is alert and oriented to person, place, and time.  Skin: Skin is warm and dry.  Psychiatric: He has a normal mood and affect. His behavior is normal. Judgment and thought content normal.    Laboratory Data:  Results for orders placed or performed during the hospital encounter of 06/10/18 (from the past 24 hour(s))   Glucose, capillary     Status: Abnormal   Collection Time: 06/10/18 11:30 AM  Result Value Ref Range   Glucose-Capillary 131 (H) 70 - 99 mg/dL  APTT     Status: None   Collection Time: 06/10/18 11:36 AM  Result Value Ref Range   aPTT 32 24 - 36 seconds  CBC     Status: None   Collection Time: 06/10/18 11:36 AM  Result Value Ref Range   WBC 6.5 4.0 - 10.5 K/uL   RBC 4.22 4.22 - 5.81 MIL/uL   Hemoglobin 13.1 13.0 - 17.0 g/dL   HCT 40.2 39.0 - 52.0 %   MCV 95.3 80.0 - 100.0 fL   MCH 31.0 26.0 - 34.0 pg   MCHC 32.6 30.0 - 36.0 g/dL   RDW 14.0 11.5 - 15.5 %   Platelets 162 150 - 400 K/uL   nRBC 0.0 0.0 - 0.2 %  Comprehensive metabolic panel     Status: Abnormal   Collection Time: 06/10/18 11:36 AM  Result Value Ref Range   Sodium 138 135 - 145 mmol/L   Potassium 4.4 3.5 - 5.1 mmol/L   Chloride 105 98 - 111 mmol/L   CO2 23 22 - 32 mmol/L   Glucose, Bld 143 (H) 70 - 99 mg/dL   BUN 45 (H) 8 - 23 mg/dL   Creatinine, Ser 2.02 (H) 0.61 - 1.24 mg/dL   Calcium 9.5 8.9 - 10.3 mg/dL   Total Protein 7.4 6.5 - 8.1 g/dL   Albumin 4.3 3.5 - 5.0 g/dL   AST 33 15 - 41 U/L   ALT 23 0 - 44 U/L   Alkaline Phosphatase 98 38 - 126 U/L   Total Bilirubin 1.3 (H) 0.3 - 1.2 mg/dL   GFR calc non Af Amer 30 (L) >60 mL/min   GFR calc Af Amer 35 (L) >60 mL/min   Anion gap 10 5 - 15  Protime-INR     Status: None   Collection Time: 06/10/18 11:36 AM  Result Value Ref Range   Prothrombin Time 13.1 11.4 - 15.2 seconds   INR 1.00    Recent Results (from the past 240 hour(s))  Surgical pcr screen     Status: None   Collection Time: 06/03/18  4:06 PM  Result Value Ref Range Status   MRSA, PCR NEGATIVE NEGATIVE Final   Staphylococcus aureus NEGATIVE NEGATIVE Final    Comment: (NOTE) The Xpert SA Assay (FDA approved for NASAL specimens in patients 24 years of age and older), is one component of a comprehensive surveillance program. It is not intended to diagnose infection nor to  guide or monitor  treatment. Performed at [facility], [place] [address] [place], [place] [address]    Creatinine: Recent Labs    06/03/18 1417 06/10/18 1136  CREATININE 1.85* 2.02*   Baseline Creatinine: 1.8  Impression/Assessment:  77yo with intermediate risk prostate cancer  Plan:  The risks/benefits/alternatives to brachytherapy with [employer] was explained to the patient and he understands and wishes to proceed with surgery  [name] 06/10/2018, 12:35 PM

## 2018-06-10 NOTE — Op Note (Signed)
PRE-OPERATIVE DIAGNOSIS:  Adenocarcinoma of the prostate  POST-OPERATIVE DIAGNOSIS:  Same  PROCEDURE:  Procedure(s): 1. I-125 radioactive seed implantation 2. Cystoscopy 3. Placement of SpaceOAR  SURGEON:  Surgeon(s): Nicolette Bang, MD  Radiation oncologist: Tyler Pita, MD  ANESTHESIA:  General  EBL:  Minimal  DRAINS: 48 French Foley catheter  INDICATION: [name] is a 77 year old with a history of T1c prostate cancer. After discussing treatment options he has elected to proceed with brachytherapy and SpaceOAR  Description of procedure: After informed consent the patient was brought to the major OR, placed on the table and administered general anesthesia. He was then moved to the modified lithotomy position with his perineum perpendicular to the floor. His perineum and genitalia were then sterilely prepped. An official timeout was then performed. A 16 French Foley catheter was then placed in the bladder and filled with dilute contrast, a rectal tube was placed in the rectum and the transrectal ultrasound probe was placed in the rectum and affixed to the stand. He was then sterilely draped.  Real time ultrasonography was used along with the seed planning software [employer] Prostate vs. 4.2.21. This was used to develop the seed plan including the number of needles as well as number of seeds required for complete and adequate coverage. Real-time ultrasonography was then used along with the previously developed plan and the Nucletron device to implant a total of 84 seeds using 28 needles. This proceeded without difficulty or complication.  We then proceeded to mix the SpaceOAR using the kit supplied from the manufacturer. Once this was complete we placed a sinal needle into the perirectal fat between the rectum and the prostate. Once this was accomplished we injected 2cc of normal saline to hydrodissect the plain. We then instilled the the SpaceOAR through the spinal needle and  noted good distribution in the perirectal fat.   A Foley catheter was then removed as well as the transrectal ultrasound probe and rectal probe. Flexible cystoscopy was then performed using the 17 French flexible scope which revealed a normal urethra throughout its length down to the sphincter which appeared intact. The prostatic urethra revealed bilobar hypertrophy but no evidence of obstruction, seeds, spacers or lesions. The bladder was then entered and fully and systematically inspected. The ureteral orifices were noted to be of normal configuration and position. The mucosa revealed no evidence of tumors. There were also no stones identified within the bladder. I noted no seeds or spacers on the floor of the bladder and retroflexion of the scope revealed no seeds protruding from the base of the prostate.  The cystoscope was then removed and a new 71 French Foley catheter was then inserted and the balloon was filled with 10 cc of sterile water. This was connected to closed system drainage and the patient was awakened and taken to recovery room in stable and satisfactory condition. He tolerated procedure well and there were no intraoperative complications.

## 2018-06-10 NOTE — Anesthesia Procedure Notes (Signed)
Procedure Name: Intubation Date/Time: 06/10/2018 1:10 PM Performed by: Daje Stark D, CRNA Pre-anesthesia Checklist: Patient identified, Emergency Drugs available, Suction available and Patient being monitored Patient Re-evaluated:Patient Re-evaluated prior to induction Oxygen Delivery Method: Circle system utilized Preoxygenation: Pre-oxygenation with 100% oxygen Induction Type: IV induction Ventilation: Mask ventilation without difficulty Laryngoscope Size: Mac and 4 Grade View: Grade I Tube type: Oral Number of attempts: 1 Airway Equipment and Method: Stylet Placement Confirmation: ETT inserted through vocal cords under direct vision,  positive ETCO2 and breath sounds checked- equal and bilateral Secured at: 22 cm Tube secured with: Tape Dental Injury: Teeth and Oropharynx as per pre-operative assessment

## 2018-06-10 NOTE — Anesthesia Postprocedure Evaluation (Signed)
Anesthesia Post Note  Patient: [name]  Procedure(s) Performed: RADIOACTIVE SEED IMPLANT/BRACHYTHERAPY IMPLANT (N/A ) SPACE OAR INSTILLATION (N/A )     Patient location during evaluation: PACU Anesthesia Type: General Level of consciousness: awake and alert, oriented and patient cooperative Pain management: pain level controlled Vital Signs Assessment: post-procedure vital signs reviewed and stable Respiratory status: spontaneous breathing, nonlabored ventilation and respiratory function stable Cardiovascular status: blood pressure returned to baseline and stable Postop Assessment: no apparent nausea or vomiting Anesthetic complications: no    Last Vitals:  Vitals:   06/10/18 1530 06/10/18 1625  BP: (!) 165/88 (!) 156/92  Pulse: 60 60  Resp: 15 16  Temp:    SpO2: 100% 97%    Last Pain:  Vitals:   06/10/18 1625  TempSrc:   PainSc: 2                  Titus Drone,E. Daden Mahany

## 2018-06-10 NOTE — Transfer of Care (Signed)
Immediate Anesthesia Transfer of Care Note  Patient: [name]  Procedure(s) Performed: RADIOACTIVE SEED IMPLANT/BRACHYTHERAPY IMPLANT (N/A ) SPACE OAR INSTILLATION (N/A )  Patient Location: PACU  Anesthesia Type:General  Level of Consciousness: awake, alert  and oriented  Airway & Oxygen Therapy: Patient Spontanous Breathing and Patient connected to face mask oxygen  Post-op Assessment: Report given to RN and Post -op Vital signs reviewed and stable  Post vital signs: Reviewed and stable  Last Vitals:  Vitals Value Taken Time  BP    Temp    Pulse    Resp    SpO2      Last Pain:  Vitals:   06/10/18 1143  TempSrc:   PainSc: 0-No pain         Complications: No apparent anesthesia complications

## 2018-06-11 ENCOUNTER — Ambulatory Visit (INDEPENDENT_AMBULATORY_CARE_PROVIDER_SITE_OTHER): Payer: Medicare Other | Admitting: *Deleted

## 2018-06-11 ENCOUNTER — Telehealth: Payer: Self-pay | Admitting: Cardiology

## 2018-06-11 DIAGNOSIS — I495 Sick sinus syndrome: Secondary | ICD-10-CM | POA: Diagnosis not present

## 2018-06-11 NOTE — Progress Notes (Signed)
  Radiation Oncology         [phone] ________________________________  Name: [name] MRN: [id number]  Date: 06/11/2018  DOB: 04-16-1941       Prostate Seed Implant  WG:YKZL, [name], MD  No ref. provider found  DIAGNOSIS: 77 y.o. gentleman with Stage T2a adenocarcinoma of the prostate with Gleason Score of 4+3, and PSA of 6.9    ICD-10-CM   1. Malignant neoplasm of prostate [facility]) [place] Discharge patient    PROCEDURE: Insertion of radioactive I-125 seeds into the prostate gland.  RADIATION DOSE: 145 Gy, definitive therapy.  TECHNIQUE: [name] was brought to the operating room with the urologist. He was placed in the dorsolithotomy position. He was catheterized and a rectal tube was inserted. The perineum was shaved, prepped and draped. The ultrasound probe was then introduced into the rectum to see the prostate gland.  TREATMENT DEVICE: A needle grid was attached to the ultrasound probe stand and anchor needles were placed.  3D PLANNING: The prostate was imaged in 3D using a sagittal sweep of the prostate probe. These images were transferred to the planning computer. There, the prostate, urethra and rectum were defined on each axial reconstructed image. Then, the software created an optimized 3D plan and a few seed positions were adjusted. The quality of the plan was reviewed using [facility] information for the target and the following two organs at risk:  Urethra and Rectum.  Then the accepted plan was printed and handed off to the radiation therapist.  Under my supervision, the custom loading of the seeds and spacers was carried out and loaded into sealed vicryl sleeves.  These pre-loaded needles were then placed into the needle holder.[name]  PROSTATE VOLUME STUDY:  Using transrectal ultrasound the volume of the prostate was verified to be 44 cc.  SPECIAL TREATMENT PROCEDURE/SUPERVISION AND HANDLING: The pre-loaded needles were then delivered under sagittal guidance. A total of 28  needles were used to deposit 84 seeds in the prostate gland. The individual seed activity was 0.429 mCi.  SpaceOAR:  Yes  COMPLEX SIMULATION: At the end of the procedure, an anterior radiograph of the pelvis was obtained to document seed positioning and count. Cystoscopy was performed to check the urethra and bladder.  MICRODOSIMETRY: At the end of the procedure, the patient was emitting 0.123 mR/hr at 1 meter. Accordingly, he was considered safe for hospital discharge.  PLAN: The patient will return to the radiation oncology clinic for post implant CT dosimetry in three weeks.   ________________________________  Sheral Apley Tammi Klippel, M.D.

## 2018-06-11 NOTE — Telephone Encounter (Signed)
Confirmed remote transmission w/ pt wife.   

## 2018-06-12 ENCOUNTER — Encounter (HOSPITAL_COMMUNITY): Payer: Self-pay | Admitting: Urology

## 2018-06-12 NOTE — Progress Notes (Signed)
Remote pacemaker transmission.   

## 2018-06-25 ENCOUNTER — Telehealth: Payer: Self-pay | Admitting: *Deleted

## 2018-06-25 NOTE — Telephone Encounter (Signed)
CALLED PATIENT TO REMIND OF POST SEED APPTS. FOR 06-26-18, SPOKE WITH PATIENT AND HE IS AWARE OF THESE APPTS.

## 2018-06-26 ENCOUNTER — Ambulatory Visit (INDEPENDENT_AMBULATORY_CARE_PROVIDER_SITE_OTHER): Payer: Medicare Other | Admitting: Urology

## 2018-06-26 ENCOUNTER — Ambulatory Visit: Admission: RE | Admit: 2018-06-26 | Payer: Medicare Other | Source: Ambulatory Visit | Admitting: Radiation Oncology

## 2018-06-26 DIAGNOSIS — C61 Malignant neoplasm of prostate: Secondary | ICD-10-CM

## 2018-07-04 ENCOUNTER — Telehealth: Payer: Self-pay | Admitting: *Deleted

## 2018-07-04 NOTE — Telephone Encounter (Signed)
CALLED PATIENT TO REMIND OF POST SEED APPTS. FOR 07-05-18, SPOKE WITH PATIENT AND HE IS AWARE OF THESE APPTS.

## 2018-07-05 ENCOUNTER — Other Ambulatory Visit: Payer: Self-pay

## 2018-07-05 ENCOUNTER — Encounter: Payer: Self-pay | Admitting: Medical Oncology

## 2018-07-05 ENCOUNTER — Ambulatory Visit
Admission: RE | Admit: 2018-07-05 | Discharge: 2018-07-05 | Disposition: A | Discharge status: Home / Self Care | Payer: Medicare Other | Source: Ambulatory Visit | Attending: Radiation Oncology | Admitting: Radiation Oncology

## 2018-07-05 ENCOUNTER — Encounter: Payer: Self-pay | Admitting: Radiation Oncology

## 2018-07-05 VITALS — BP 157/82 | HR 68 | Resp 18

## 2018-07-05 DIAGNOSIS — Z79899 Other long term (current) drug therapy: Secondary | ICD-10-CM | POA: Diagnosis not present

## 2018-07-05 DIAGNOSIS — C61 Malignant neoplasm of prostate: Secondary | ICD-10-CM | POA: Diagnosis not present

## 2018-07-05 DIAGNOSIS — Z7984 Long term (current) use of oral hypoglycemic drugs: Secondary | ICD-10-CM | POA: Diagnosis not present

## 2018-07-05 DIAGNOSIS — Z923 Personal history of irradiation: Secondary | ICD-10-CM | POA: Diagnosis not present

## 2018-07-05 DIAGNOSIS — Z7982 Long term (current) use of aspirin: Secondary | ICD-10-CM | POA: Diagnosis not present

## 2018-07-05 NOTE — Progress Notes (Signed)
Vitals stable. Pre seed IPSS 10. Post seed IPSS 24. Patient is three and a half weeks out from surgery. Patient confirms LUTS are gradually improving. Reports intermittent dysuria that is present more often than not. Reports urinary urgency. Reports leakage is less but he continues to wear Depends. Reports rectal soreness but no blood. Reports approximately three episodes of diarrhea per day. Patient has a pacemaker thus will not have an MRI to confirm SpaceOar placement.   BP (!) 157/82   Pulse 68   Resp 18   SpO2 100%

## 2018-07-05 NOTE — Progress Notes (Signed)
  Radiation Oncology         [phone]) [phone] ________________________________  Name: ISSAC MOURE MRN: [id number]  Date: 07/05/2018  DOB: Apr 03, 1941  COMPLEX SIMULATION NOTE  NARRATIVE:  The patient was brought to the [place] suite today following prostate seed implantation approximately one month ago.  Identity was confirmed.  All relevant records and images related to the planned course of therapy were reviewed.  Then, the patient was set-up supine.  CT images were obtained.  The CT images were loaded into the planning software.  Then the prostate and rectum were contoured.  Treatment planning then occurred.  The implanted iodine 125 seeds were identified by the physics staff for projection of radiation distribution  I have requested : 3D Simulation  I have requested a DVH of the following structures: Prostate and rectum.    ________________________________  Sheral Apley Tammi Klippel, M.D.  This document serves as a record of services personally performed by Tyler Pita, MD. It was created on his behalf by Wilburn Mylar, a trained medical scribe. The creation of this record is based on the scribe's personal observations and the provider's statements to them. This document has been checked and approved by the attending provider.

## 2018-07-05 NOTE — Progress Notes (Signed)
Radiation Oncology         [phone] ________________________________  Name: [name] MRN: [id number]  Date: 07/05/2018  DOB: [birth year]  Post-Seed Follow-Up Visit Note  CC: Sean Blitz, MD  Sean Gustin, MD  Diagnosis:   77 y.o. male with Stage T2a adenocarcinoma of the prostate with Gleason Score of 4+3, and PSA of 6.9    ICD-10-CM   1. Malignant neoplasm of prostate ([place]) C61     Interval Since Last Radiation:  3 weeks and 4 days  06/10/18: Insertion of radioactive I-125 seeds into the prostate gland; 145 Gy, definitive therapy with placement of SpaceOAR gel.  Narrative:  The patient returns today for routine follow-up. He filled out a questionnaire regarding urinary function today providing and overall IPSS score of 24 characterizing his symptoms as severe. He reports intermittent dysuria, urinary urgency, leakage (wears Depends), rectal soreness/pressure (with no blood), and diarrhea x3 per day, all improving over the past week.  He specifically denies gross hematuria, abdominal pain, straining to void or feelings of incomplete emptying.  His pre-implant score was 10. He he reports improvement in his appetite over the past week and that he is maintaining his weight.  He continues with moderate fatigue which he does feel is gradually improving.  Overall, he is quite pleased with his progress to date..  ALLERGIES:  has No Known Allergies.  Meds: Current Outpatient Medications  Medication Sig Dispense Refill  . amLODipine (NORVASC) 5 MG tablet Take 1 tablet (5 mg total) by mouth daily. (Patient taking differently: Take 5 mg by mouth at bedtime. ) 30 tablet 11  . aspirin EC 81 MG tablet Take 81 mg by mouth daily.    [name] atorvastatin (LIPITOR) 40 MG tablet Take 40 mg by mouth daily.    . carboxymethylcellulose (REFRESH PLUS) 0.5 % SOLN Place 1 drop into both eyes daily as needed (dry eyes).    . Coenzyme Q10 (CO Q 10 PO) Take 200 mg by mouth daily.    . diclofenac  (VOLTAREN) 75 MG EC tablet Take 75 mg by mouth 2 (two) times daily.    [name] glimepiride (AMARYL) 4 MG tablet Take 2 mg by mouth daily.     [name] losartan (COZAAR) 100 MG tablet Take 100 mg by mouth daily.    . metoprolol succinate (TOPROL-XL) 50 MG 24 hr tablet Take 50 mg by mouth daily.     . Multiple Vitamins-Minerals (MULTIVITAMIN ADULTS PO) Take 1 tablet by mouth daily.     . Multiple Vitamins-Minerals (PRESERVISION AREDS 2 PO) Take 1 tablet by mouth 2 (two) times daily.    . pioglitazone (ACTOS) 30 MG tablet Take 30 mg by mouth daily.     . traMADol (ULTRAM) 50 MG tablet Take 1 tablet (50 mg total) by mouth every 6 (six) hours as needed. 30 tablet 0   No current facility-administered medications for this encounter.     Physical Findings: In general this is a well appearing Caucasian gentleman in no acute distress. He's alert and oriented x4 and appropriate throughout the examination. Cardiopulmonary assessment is negative for acute distress and he exhibits normal effort.   Lab Findings: Lab Results  Component Value Date   WBC 6.5 06/10/2018   HGB 13.1 06/10/2018   HCT 40.2 06/10/2018   MCV 95.3 06/10/2018   PLT 162 06/10/2018    Radiographic Findings:  Patient underwent CT imaging in our clinic for post implant dosimetry. The CT was reviewed by Dr. Tammi Klippel and  appears to demonstrate an adequate distribution of radioactive seeds throughout the prostate gland. There are no seeds in or near the rectum.  He is unable to have an MRI for verification of SpaceOAR gel placement due to an implanted cardiac pacer. We suspect the final radiation plan and dosimetry will show appropriate coverage of the prostate gland.   Impression/Plan: The patient is recovering from the effects of radiation. His urinary symptoms should gradually improve over the next 4-6 months. We talked about this today. He is encouraged by his improvement already and is otherwise pleased with his outcome. We also talked about  long-term follow-up for prostate cancer following seed implant. He understands that ongoing PSA determinations and digital rectal exams will help perform surveillance to rule out disease recurrence. He has a follow up appointment scheduled with Dr. Alyson Ingles on 07/12/18 and 09/25/18. He understands what to expect with his PSA measures. Patient was also educated today about some of the long-term effects from radiation including a small risk for rectal bleeding and possibly erectile dysfunction. We talked about some of the general management approaches to these potential complications. However, I did encourage the patient to contact our office or return at any point if he has questions or concerns related to his previous radiation and prostate cancer.    Sean Johns, PA-C  This document serves as a record of services personally performed by [employer], PA-C. It was created on her behalf by Wilburn Mylar, a trained medical scribe. The creation of this record is based on the scribe's personal observations and the provider's statements to them. This document has been checked and approved by the attending provider.

## 2018-07-12 ENCOUNTER — Ambulatory Visit (INDEPENDENT_AMBULATORY_CARE_PROVIDER_SITE_OTHER): Payer: Medicare Other | Admitting: Urology

## 2018-07-12 DIAGNOSIS — R3912 Poor urinary stream: Secondary | ICD-10-CM

## 2018-07-15 DIAGNOSIS — Z6839 Body mass index (BMI) 39.0-39.9, adult: Secondary | ICD-10-CM | POA: Diagnosis not present

## 2018-07-15 DIAGNOSIS — N183 Chronic kidney disease, stage 3 (moderate): Secondary | ICD-10-CM | POA: Diagnosis not present

## 2018-07-15 DIAGNOSIS — E1165 Type 2 diabetes mellitus with hyperglycemia: Secondary | ICD-10-CM | POA: Diagnosis not present

## 2018-07-15 DIAGNOSIS — Z299 Encounter for prophylactic measures, unspecified: Secondary | ICD-10-CM | POA: Diagnosis not present

## 2018-07-15 DIAGNOSIS — I10 Essential (primary) hypertension: Secondary | ICD-10-CM | POA: Diagnosis not present

## 2018-07-15 DIAGNOSIS — C61 Malignant neoplasm of prostate: Secondary | ICD-10-CM | POA: Diagnosis not present

## 2018-07-15 DIAGNOSIS — E1122 Type 2 diabetes mellitus with diabetic chronic kidney disease: Secondary | ICD-10-CM | POA: Diagnosis not present

## 2018-07-19 ENCOUNTER — Encounter (HOSPITAL_COMMUNITY): Payer: Medicare Other

## 2018-07-19 ENCOUNTER — Ambulatory Visit: Payer: Medicare Other | Admitting: Family

## 2018-07-22 ENCOUNTER — Encounter: Payer: Self-pay | Admitting: Radiation Oncology

## 2018-07-22 DIAGNOSIS — C61 Malignant neoplasm of prostate: Secondary | ICD-10-CM | POA: Diagnosis not present

## 2018-07-28 NOTE — Progress Notes (Signed)
  Radiation Oncology         [phone]) [phone] ________________________________  Name: [name] MRN: [id number]  Date: 07/22/2018  DOB: [birth year]  3D Planning Note   Prostate Brachytherapy Post-Implant Dosimetry  Diagnosis: 77 y.o. 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  Narrative: On a previous date, [name] returned following prostate seed implantation for post implant planning. He underwent CT scan complex simulation to delineate the three-dimensional structures of the pelvis and demonstrate the radiation distribution.  Since that time, the seed localization, and complex isodose planning with dose volume histograms have now been completed.  Results:   Prostate Coverage - The dose of radiation delivered to the 90% or more of the prostate gland (D90) was 111.36% of the prescription dose. This exceeds our goal of greater than 90%. Rectal Sparing - The volume of rectal tissue receiving the prescription dose or higher was 0.04 cc. This falls under our thresholds tolerance of 1.0 cc.  Impression: The prostate seed implant appears to show adequate target coverage and appropriate rectal sparing.  Plan:  The patient will continue to follow with urology for ongoing PSA determinations. I would anticipate a high likelihood for local tumor control with minimal risk for rectal morbidity.  ________________________________  Sheral Apley Tammi Klippel, M.D.

## 2018-08-11 LAB — CUP PACEART REMOTE DEVICE CHECK
Battery Impedance: 203 Ohm
Battery Voltage: 2.78 V
Brady Statistic AP VP Percent: 0 %
Brady Statistic AP VS Percent: 69 %
Brady Statistic AS VP Percent: 0 %
Date Time Interrogation Session: 20191112221909
Implantable Lead Implant Date: 20000419
Implantable Lead Location: 753860
Implantable Lead Model: 4023
Lead Channel Impedance Value: 359 Ohm
Lead Channel Impedance Value: 417 Ohm
Lead Channel Pacing Threshold Amplitude: 0.75 V
Lead Channel Pacing Threshold Pulse Width: 0.4 ms
Lead Channel Pacing Threshold Pulse Width: 0.4 ms
Lead Channel Setting Pacing Amplitude: 2.5 V
Lead Channel Setting Pacing Pulse Width: 0.4 ms
MDC IDC LEAD IMPLANT DT: 20000419
MDC IDC LEAD LOCATION: 753859
MDC IDC MSMT BATTERY REMAINING LONGEVITY: 121 mo
MDC IDC MSMT LEADCHNL RV PACING THRESHOLD AMPLITUDE: 0.375 V
MDC IDC PG IMPLANT DT: 20150831
MDC IDC SET LEADCHNL RA PACING AMPLITUDE: 2 V
MDC IDC SET LEADCHNL RV SENSING SENSITIVITY: 2 mV
MDC IDC STAT BRADY AS VS PERCENT: 30 %

## 2018-08-21 ENCOUNTER — Other Ambulatory Visit: Payer: Self-pay | Admitting: Urology

## 2018-08-21 ENCOUNTER — Telehealth: Payer: Self-pay | Admitting: Radiation Oncology

## 2018-08-21 MED ORDER — TAMSULOSIN HCL 0.4 MG PO CAPS: 0.8000 mg | ORAL_CAPSULE | Freq: Every day | ORAL | 3 refills | Status: DC

## 2018-08-21 NOTE — Telephone Encounter (Signed)
I phoned the patient back in follow-up to a recent telephone note from Joaquim Lai, RN regarding patient reporting significant LUTS, persistent since the time of his seed implant procedure on 06/10/2018.  He confirms that he continues with a significantly weak stream both day and night but more noticeable at night, mild dysuria at the beginning of his stream, and most bothersome is nocturia every 45 minutes to an hour at night.  He has a strong urge to void but only producing a small volume of urine, particularly in the evenings.  He denies excessive daytime frequency or urgency and feels that he empties his bladder relatively well throughout the day.  He reports that his symptoms are persistent but do not appear to be progressively worsening and he denies any gross hematuria.  He reports that initially he felt like his symptoms were improving but more recently he has noticed a stall in progress.  We had discussed the potential use of Flomax at his routine post-seed follow-up visit but at that time he was reluctant to start any additional medications.  However, he is interested in giving this a try now.  I advised that I am happy to send a prescription for Flomax 0.4 mg to be taken 2 capsules nightly to see if this will help with his current LUTS.  He has a scheduled follow-up visit with his urologist, Dr. Alyson Ingles in late March or early April.  He is advised to give [place] a call if he is not noticing any significant improvement on this medication.  However, if he is appreciating benefit, he is to continue this medication until his follow-up visit with Dr. Alyson Ingles and they will discuss timing for discontinuation of this medication at that time.  He appears to have a good understanding of my recommendations and is in agreement.  [name], MMS, PA-C [place] [facility] [place]: [phone]  Fax: [phone]

## 2018-08-21 NOTE — Telephone Encounter (Signed)
-----   Message from Freeman Caldron, [place] sent at 08/20/2018  4:07 PM EST ----- Regarding: FW: PHONE CALL Sam, Can you call patient and see if you can get some additional information regarding the nature of his call? Thank you! -Ashlyn ----- Message ----- From: Kerri Perches Sent: 08/19/2018   3:09 PM EST To: Freeman Caldron, PA-C Subject: PHONE CALL                                     Hi Ashlyn,   The above patient has called and requested that you call him.  Mr. Deleo' phone number is [phone].  Thanks,  [employer]

## 2018-08-21 NOTE — Telephone Encounter (Signed)
Phoned patient as requested by Freeman Caldron, PA-C. Patient had seed implant done 06/10/2018. Patient states, "I am questioning if I am on track with my recovery." Patient reports difficulty urination. Reports a weak urine stream "mostly a dribble" during the day and night time hours. Reports dysuria worse at start of urine stream. Reports nocturia every 45 minutes to 1 hour. Denies urinary frequency during the day. Denies hematuria. Reports wearing a depends to bed and seeing scant leakage. Reports loose urgent bowels that keep him from leaving home. Reports he was last seen by urologist in [place] mid December and doesn't have a follow up with him again for another month. Denies taking Flomax. Confirms NKDA. Preferred pharmacy is [facility]. Patient understands this RN will consult with Ashlyn and phone him back with further directions.

## 2018-09-10 ENCOUNTER — Ambulatory Visit: Payer: Medicare Other

## 2018-09-11 ENCOUNTER — Ambulatory Visit (INDEPENDENT_AMBULATORY_CARE_PROVIDER_SITE_OTHER): Payer: Medicare Other

## 2018-09-11 DIAGNOSIS — I442 Atrioventricular block, complete: Secondary | ICD-10-CM | POA: Diagnosis not present

## 2018-09-11 DIAGNOSIS — I495 Sick sinus syndrome: Secondary | ICD-10-CM

## 2018-09-12 LAB — CUP PACEART REMOTE DEVICE CHECK
Battery Remaining Longevity: 123 mo
Battery Voltage: 2.78 V
Brady Statistic AP VS Percent: 52 %
Implantable Lead Implant Date: 20000419
Implantable Lead Location: 753859
Implantable Lead Location: 753860
Implantable Lead Model: 4023
Implantable Pulse Generator Implant Date: 20150831
Lead Channel Pacing Threshold Amplitude: 0.375 V
Lead Channel Pacing Threshold Pulse Width: 0.4 ms
Lead Channel Setting Pacing Amplitude: 2 V
Lead Channel Setting Pacing Pulse Width: 0.4 ms
Lead Channel Setting Sensing Sensitivity: 2 mV
MDC IDC LEAD IMPLANT DT: 20000419
MDC IDC MSMT BATTERY IMPEDANCE: 203 Ohm
MDC IDC MSMT LEADCHNL RA IMPEDANCE VALUE: 330 Ohm
MDC IDC MSMT LEADCHNL RA PACING THRESHOLD AMPLITUDE: 0.75 V
MDC IDC MSMT LEADCHNL RA PACING THRESHOLD PULSEWIDTH: 0.4 ms
MDC IDC MSMT LEADCHNL RV IMPEDANCE VALUE: 408 Ohm
MDC IDC SESS DTM: 20200212151045
MDC IDC SET LEADCHNL RV PACING AMPLITUDE: 2.5 V
MDC IDC STAT BRADY AP VP PERCENT: 0 %
MDC IDC STAT BRADY AS VP PERCENT: 0 %
MDC IDC STAT BRADY AS VS PERCENT: 47 %

## 2018-09-17 DIAGNOSIS — N3000 Acute cystitis without hematuria: Secondary | ICD-10-CM | POA: Diagnosis not present

## 2018-09-17 DIAGNOSIS — C61 Malignant neoplasm of prostate: Secondary | ICD-10-CM | POA: Diagnosis not present

## 2018-09-23 NOTE — Progress Notes (Signed)
Remote pacemaker transmission.   

## 2018-09-25 ENCOUNTER — Ambulatory Visit (INDEPENDENT_AMBULATORY_CARE_PROVIDER_SITE_OTHER): Payer: Medicare Other | Admitting: Urology

## 2018-09-25 DIAGNOSIS — N3000 Acute cystitis without hematuria: Secondary | ICD-10-CM | POA: Diagnosis not present

## 2018-09-25 DIAGNOSIS — R3912 Poor urinary stream: Secondary | ICD-10-CM | POA: Diagnosis not present

## 2018-10-01 DIAGNOSIS — I10 Essential (primary) hypertension: Secondary | ICD-10-CM | POA: Diagnosis not present

## 2018-10-01 DIAGNOSIS — Z6836 Body mass index (BMI) 36.0-36.9, adult: Secondary | ICD-10-CM | POA: Diagnosis not present

## 2018-10-01 DIAGNOSIS — Z0000 Encounter for general adult medical examination without abnormal findings: Secondary | ICD-10-CM | POA: Diagnosis not present

## 2018-10-01 DIAGNOSIS — R5383 Other fatigue: Secondary | ICD-10-CM | POA: Diagnosis not present

## 2018-10-01 DIAGNOSIS — Z299 Encounter for prophylactic measures, unspecified: Secondary | ICD-10-CM | POA: Diagnosis not present

## 2018-10-01 DIAGNOSIS — Z1331 Encounter for screening for depression: Secondary | ICD-10-CM | POA: Diagnosis not present

## 2018-10-01 DIAGNOSIS — Z1211 Encounter for screening for malignant neoplasm of colon: Secondary | ICD-10-CM | POA: Diagnosis not present

## 2018-10-01 DIAGNOSIS — Z1339 Encounter for screening examination for other mental health and behavioral disorders: Secondary | ICD-10-CM | POA: Diagnosis not present

## 2018-10-01 DIAGNOSIS — Z7189 Other specified counseling: Secondary | ICD-10-CM | POA: Diagnosis not present

## 2018-10-01 DIAGNOSIS — E7800 Pure hypercholesterolemia, unspecified: Secondary | ICD-10-CM | POA: Diagnosis not present

## 2018-10-23 ENCOUNTER — Emergency Department (HOSPITAL_COMMUNITY): Payer: Medicare Other

## 2018-10-23 ENCOUNTER — Other Ambulatory Visit: Payer: Self-pay

## 2018-10-23 ENCOUNTER — Inpatient Hospital Stay (HOSPITAL_COMMUNITY)
Admission: EM | Admit: 2018-10-23 | Discharge: 2018-10-27 | DRG: 377 | Disposition: A | Discharge status: Home Health | Payer: Medicare Other | Attending: Internal Medicine | Admitting: Internal Medicine

## 2018-10-23 ENCOUNTER — Encounter (HOSPITAL_COMMUNITY): Payer: Self-pay | Admitting: Emergency Medicine

## 2018-10-23 DIAGNOSIS — I714 Abdominal aortic aneurysm, without rupture, unspecified: Secondary | ICD-10-CM | POA: Diagnosis present

## 2018-10-23 DIAGNOSIS — K254 Chronic or unspecified gastric ulcer with hemorrhage: Secondary | ICD-10-CM | POA: Diagnosis present

## 2018-10-23 DIAGNOSIS — M21371 Foot drop, right foot: Secondary | ICD-10-CM | POA: Diagnosis present

## 2018-10-23 DIAGNOSIS — G4730 Sleep apnea, unspecified: Secondary | ICD-10-CM | POA: Diagnosis present

## 2018-10-23 DIAGNOSIS — N390 Urinary tract infection, site not specified: Secondary | ICD-10-CM | POA: Diagnosis present

## 2018-10-23 DIAGNOSIS — K264 Chronic or unspecified duodenal ulcer with hemorrhage: Secondary | ICD-10-CM | POA: Diagnosis not present

## 2018-10-23 DIAGNOSIS — N183 Chronic kidney disease, stage 3 unspecified: Secondary | ICD-10-CM | POA: Diagnosis present

## 2018-10-23 DIAGNOSIS — R7989 Other specified abnormal findings of blood chemistry: Secondary | ICD-10-CM | POA: Diagnosis present

## 2018-10-23 DIAGNOSIS — E875 Hyperkalemia: Secondary | ICD-10-CM | POA: Diagnosis present

## 2018-10-23 DIAGNOSIS — M19071 Primary osteoarthritis, right ankle and foot: Secondary | ICD-10-CM | POA: Diagnosis not present

## 2018-10-23 DIAGNOSIS — W19XXXA Unspecified fall, initial encounter: Secondary | ICD-10-CM | POA: Diagnosis not present

## 2018-10-23 DIAGNOSIS — K259 Gastric ulcer, unspecified as acute or chronic, without hemorrhage or perforation: Secondary | ICD-10-CM | POA: Diagnosis not present

## 2018-10-23 DIAGNOSIS — I129 Hypertensive chronic kidney disease with stage 1 through stage 4 chronic kidney disease, or unspecified chronic kidney disease: Secondary | ICD-10-CM | POA: Diagnosis present

## 2018-10-23 DIAGNOSIS — D62 Acute posthemorrhagic anemia: Secondary | ICD-10-CM | POA: Diagnosis present

## 2018-10-23 DIAGNOSIS — K921 Melena: Secondary | ICD-10-CM | POA: Diagnosis not present

## 2018-10-23 DIAGNOSIS — Z8546 Personal history of malignant neoplasm of prostate: Secondary | ICD-10-CM | POA: Diagnosis not present

## 2018-10-23 DIAGNOSIS — Z7984 Long term (current) use of oral hypoglycemic drugs: Secondary | ICD-10-CM | POA: Diagnosis not present

## 2018-10-23 DIAGNOSIS — K922 Gastrointestinal hemorrhage, unspecified: Secondary | ICD-10-CM | POA: Diagnosis not present

## 2018-10-23 DIAGNOSIS — Z87891 Personal history of nicotine dependence: Secondary | ICD-10-CM

## 2018-10-23 DIAGNOSIS — I2510 Atherosclerotic heart disease of native coronary artery without angina pectoris: Secondary | ICD-10-CM | POA: Diagnosis present

## 2018-10-23 DIAGNOSIS — E869 Volume depletion, unspecified: Secondary | ICD-10-CM | POA: Diagnosis present

## 2018-10-23 DIAGNOSIS — N189 Chronic kidney disease, unspecified: Secondary | ICD-10-CM | POA: Diagnosis present

## 2018-10-23 DIAGNOSIS — A419 Sepsis, unspecified organism: Secondary | ICD-10-CM | POA: Diagnosis present

## 2018-10-23 DIAGNOSIS — N170 Acute kidney failure with tubular necrosis: Secondary | ICD-10-CM | POA: Diagnosis present

## 2018-10-23 DIAGNOSIS — D649 Anemia, unspecified: Secondary | ICD-10-CM

## 2018-10-23 DIAGNOSIS — J449 Chronic obstructive pulmonary disease, unspecified: Secondary | ICD-10-CM | POA: Diagnosis present

## 2018-10-23 DIAGNOSIS — C61 Malignant neoplasm of prostate: Secondary | ICD-10-CM | POA: Diagnosis present

## 2018-10-23 DIAGNOSIS — Z950 Presence of cardiac pacemaker: Secondary | ICD-10-CM | POA: Diagnosis present

## 2018-10-23 DIAGNOSIS — Z791 Long term (current) use of non-steroidal anti-inflammatories (NSAID): Secondary | ICD-10-CM | POA: Diagnosis not present

## 2018-10-23 DIAGNOSIS — E1142 Type 2 diabetes mellitus with diabetic polyneuropathy: Secondary | ICD-10-CM | POA: Diagnosis present

## 2018-10-23 DIAGNOSIS — Z96653 Presence of artificial knee joint, bilateral: Secondary | ICD-10-CM | POA: Diagnosis present

## 2018-10-23 DIAGNOSIS — K2980 Duodenitis without bleeding: Secondary | ICD-10-CM | POA: Diagnosis not present

## 2018-10-23 DIAGNOSIS — R52 Pain, unspecified: Secondary | ICD-10-CM | POA: Diagnosis not present

## 2018-10-23 DIAGNOSIS — R58 Hemorrhage, not elsewhere classified: Secondary | ICD-10-CM | POA: Diagnosis not present

## 2018-10-23 DIAGNOSIS — E1122 Type 2 diabetes mellitus with diabetic chronic kidney disease: Secondary | ICD-10-CM | POA: Diagnosis not present

## 2018-10-23 DIAGNOSIS — M79673 Pain in unspecified foot: Secondary | ICD-10-CM

## 2018-10-23 DIAGNOSIS — R42 Dizziness and giddiness: Secondary | ICD-10-CM | POA: Diagnosis not present

## 2018-10-23 DIAGNOSIS — J9811 Atelectasis: Secondary | ICD-10-CM | POA: Diagnosis not present

## 2018-10-23 DIAGNOSIS — N179 Acute kidney failure, unspecified: Secondary | ICD-10-CM | POA: Diagnosis present

## 2018-10-23 DIAGNOSIS — E785 Hyperlipidemia, unspecified: Secondary | ICD-10-CM | POA: Diagnosis present

## 2018-10-23 DIAGNOSIS — E0842 Diabetes mellitus due to underlying condition with diabetic polyneuropathy: Secondary | ICD-10-CM | POA: Diagnosis present

## 2018-10-23 DIAGNOSIS — N3090 Cystitis, unspecified without hematuria: Secondary | ICD-10-CM | POA: Diagnosis not present

## 2018-10-23 DIAGNOSIS — I959 Hypotension, unspecified: Secondary | ICD-10-CM | POA: Diagnosis present

## 2018-10-23 DIAGNOSIS — K6289 Other specified diseases of anus and rectum: Secondary | ICD-10-CM | POA: Diagnosis not present

## 2018-10-23 DIAGNOSIS — Z951 Presence of aortocoronary bypass graft: Secondary | ICD-10-CM | POA: Diagnosis not present

## 2018-10-23 DIAGNOSIS — L89312 Pressure ulcer of right buttock, stage 2: Secondary | ICD-10-CM | POA: Diagnosis present

## 2018-10-23 DIAGNOSIS — Z96643 Presence of artificial hip joint, bilateral: Secondary | ICD-10-CM | POA: Diagnosis present

## 2018-10-23 DIAGNOSIS — N200 Calculus of kidney: Secondary | ICD-10-CM | POA: Diagnosis not present

## 2018-10-23 DIAGNOSIS — L8990 Pressure ulcer of unspecified site, unspecified stage: Secondary | ICD-10-CM

## 2018-10-23 DIAGNOSIS — K269 Duodenal ulcer, unspecified as acute or chronic, without hemorrhage or perforation: Secondary | ICD-10-CM | POA: Diagnosis not present

## 2018-10-23 DIAGNOSIS — K2981 Duodenitis with bleeding: Secondary | ICD-10-CM | POA: Diagnosis present

## 2018-10-23 DIAGNOSIS — I495 Sick sinus syndrome: Secondary | ICD-10-CM | POA: Diagnosis present

## 2018-10-23 DIAGNOSIS — Z7982 Long term (current) use of aspirin: Secondary | ICD-10-CM

## 2018-10-23 DIAGNOSIS — E11649 Type 2 diabetes mellitus with hypoglycemia without coma: Secondary | ICD-10-CM | POA: Diagnosis present

## 2018-10-23 LAB — FERRITIN: Ferritin: 149 ng/mL (ref 24–336)

## 2018-10-23 LAB — HEMOGLOBIN AND HEMATOCRIT, BLOOD
HCT: 25.7 % — ABNORMAL LOW (ref 39.0–52.0)
HCT: 23.3 % — ABNORMAL LOW (ref 39.0–52.0)
Hemoglobin: 8 g/dL — ABNORMAL LOW (ref 13.0–17.0)
Hemoglobin: 7.5 g/dL — ABNORMAL LOW (ref 13.0–17.0)

## 2018-10-23 LAB — COMPREHENSIVE METABOLIC PANEL
ALBUMIN: 3 g/dL — AB (ref 3.5–5.0)
ALT: 15 U/L (ref 0–44)
ANION GAP: 8 (ref 5–15)
AST: 24 U/L (ref 15–41)
Alkaline Phosphatase: 92 U/L (ref 38–126)
BUN: 126 mg/dL — ABNORMAL HIGH (ref 8–23)
CHLORIDE: 108 mmol/L (ref 98–111)
CO2: 19 mmol/L — ABNORMAL LOW (ref 22–32)
CREATININE: 3.89 mg/dL — AB (ref 0.61–1.24)
Calcium: 8.3 mg/dL — ABNORMAL LOW (ref 8.9–10.3)
GFR, EST AFRICAN AMERICAN: 16 mL/min — AB (ref >60)
GFR, EST NON AFRICAN AMERICAN: 14 mL/min — AB (ref >60)
Glucose, Bld: 94 mg/dL (ref 70–99)
POTASSIUM: 5.8 mmol/L — AB (ref 3.5–5.1)
SODIUM: 135 mmol/L (ref 135–145)
Total Bilirubin: 0.4 mg/dL (ref 0.3–1.2)
Total Protein: 6.2 g/dL — ABNORMAL LOW (ref 6.5–8.1)

## 2018-10-23 LAB — PREPARE RBC (CROSSMATCH)

## 2018-10-23 LAB — URINALYSIS, ROUTINE W REFLEX MICROSCOPIC
Bilirubin Urine: NEGATIVE
Glucose, UA: NEGATIVE mg/dL
Ketones, ur: NEGATIVE mg/dL
Nitrite: NEGATIVE
Protein, ur: 30 mg/dL — AB
Specific Gravity, Urine: 1.02 (ref 1.005–1.030)
pH: 5.5 (ref 5.0–8.0)

## 2018-10-23 LAB — IRON AND TIBC
Iron: 61 ug/dL (ref 45–182)
SATURATION RATIOS: 23 % (ref 17.9–39.5)
TIBC: 263 ug/dL (ref 250–450)
UIBC: 202 ug/dL

## 2018-10-23 LAB — LACTIC ACID, PLASMA
LACTIC ACID, VENOUS: 1.2 mmol/L (ref 0.5–1.9)
Lactic Acid, Venous: 2.7 mmol/L (ref 0.5–1.9)
Lactic Acid, Venous: 2.1 mmol/L (ref 0.5–1.9)

## 2018-10-23 LAB — CBC WITH DIFFERENTIAL/PLATELET
Abs Immature Granulocytes: 0.03 10*3/uL (ref 0.00–0.07)
Basophils Absolute: 0 10*3/uL (ref 0.0–0.1)
Basophils Relative: 0 %
Eosinophils Absolute: 0 10*3/uL (ref 0.0–0.5)
Eosinophils Relative: 0 %
HCT: 26.3 % — ABNORMAL LOW (ref 39.0–52.0)
Hemoglobin: 8.4 g/dL — ABNORMAL LOW (ref 13.0–17.0)
Immature Granulocytes: 0 %
Lymphocytes Relative: 7 %
Lymphs Abs: 0.6 10*3/uL — ABNORMAL LOW (ref 0.7–4.0)
MCH: 29.9 pg (ref 26.0–34.0)
MCHC: 31.9 g/dL (ref 30.0–36.0)
MCV: 93.6 fL (ref 80.0–100.0)
Monocytes Absolute: 0.7 10*3/uL (ref 0.1–1.0)
Monocytes Relative: 8 %
NRBC: 0 % (ref 0.0–0.2)
Neutro Abs: 7.3 10*3/uL (ref 1.7–7.7)
Neutrophils Relative %: 85 %
Platelets: 183 10*3/uL (ref 150–400)
RBC: 2.81 MIL/uL — AB (ref 4.22–5.81)
RDW: 15.7 % — ABNORMAL HIGH (ref 11.5–15.5)
WBC: 8.7 10*3/uL (ref 4.0–10.5)

## 2018-10-23 LAB — MRSA PCR SCREENING: MRSA by PCR: NEGATIVE

## 2018-10-23 LAB — FOLATE: Folate: 13.7 ng/mL (ref >5.9)

## 2018-10-23 LAB — POC OCCULT BLOOD, ED: Fecal Occult Bld: POSITIVE — AB

## 2018-10-23 LAB — URINALYSIS, MICROSCOPIC (REFLEX): WBC, UA: >50 WBC/hpf (ref 0–5)

## 2018-10-23 LAB — HEMOGLOBIN A1C
Hgb A1c MFr Bld: 6 % — ABNORMAL HIGH (ref 4.8–5.6)
Mean Plasma Glucose: 125.5 mg/dL

## 2018-10-23 LAB — GLUCOSE, CAPILLARY
Glucose-Capillary: 67 mg/dL — ABNORMAL LOW (ref 70–99)
Glucose-Capillary: 157 mg/dL — ABNORMAL HIGH (ref 70–99)

## 2018-10-23 LAB — ABO/RH: ABO/RH(D): O POS

## 2018-10-23 LAB — RETICULOCYTES
Immature Retic Fract: 11.1 % (ref 2.3–15.9)
RBC.: 2.79 MIL/uL — ABNORMAL LOW (ref 4.22–5.81)
Retic Count, Absolute: 41 10*3/uL (ref 19.0–186.0)
Retic Ct Pct: 1.5 % (ref 0.4–3.1)

## 2018-10-23 LAB — VITAMIN B12: Vitamin B-12: 258 pg/mL (ref 180–914)

## 2018-10-23 LAB — LIPASE, BLOOD: Lipase: 49 U/L (ref 11–51)

## 2018-10-23 LAB — PROTIME-INR
INR: 1.1 (ref 0.8–1.2)
PROTHROMBIN TIME: 14.4 s (ref 11.4–15.2)

## 2018-10-23 LAB — TROPONIN I: Troponin I: <0.03 ng/mL (ref <0.03)

## 2018-10-23 MED ORDER — SODIUM CHLORIDE 0.9% IV SOLUTION
Freq: Once | INTRAVENOUS | Status: AC
  Administered 2018-10-25: 16:00:00 via INTRAVENOUS

## 2018-10-23 MED ORDER — SODIUM ZIRCONIUM CYCLOSILICATE 10 G PO PACK
10.0000 g | PACK | Freq: Three times a day (TID) | ORAL | Status: AC
  Administered 2018-10-23 (×2): 10 g via ORAL
  Filled 2018-10-23 (×2): qty 1

## 2018-10-23 MED ORDER — BOOST / RESOURCE BREEZE PO LIQD CUSTOM
1.0000 | Freq: Three times a day (TID) | ORAL | Status: DC
  Administered 2018-10-23 – 2018-10-27 (×7): 1 via ORAL

## 2018-10-23 MED ORDER — SODIUM CHLORIDE 0.9 % IV SOLN
INTRAVENOUS | Status: DC
  Administered 2018-10-23 – 2018-10-24 (×3): via INTRAVENOUS

## 2018-10-23 MED ORDER — ACETAMINOPHEN 650 MG RE SUPP: 650.0000 mg | Freq: Four times a day (QID) | RECTAL | Status: DC | PRN

## 2018-10-23 MED ORDER — TRAZODONE HCL 50 MG PO TABS
25.0000 mg | ORAL_TABLET | Freq: Every evening | ORAL | Status: DC | PRN
  Administered 2018-10-23: 25 mg via ORAL
  Filled 2018-10-23: qty 1

## 2018-10-23 MED ORDER — SODIUM CHLORIDE 0.9 % IV SOLN
1.0000 g | Freq: Once | INTRAVENOUS | Status: AC
  Administered 2018-10-23: 1 g via INTRAVENOUS
  Filled 2018-10-23: qty 10

## 2018-10-23 MED ORDER — ATORVASTATIN CALCIUM 40 MG PO TABS
40.0000 mg | ORAL_TABLET | Freq: Every day | ORAL | Status: DC
  Administered 2018-10-23 – 2018-10-26 (×4): 40 mg via ORAL
  Filled 2018-10-23 (×4): qty 1

## 2018-10-23 MED ORDER — ONDANSETRON HCL 4 MG/2ML IJ SOLN: 4.0000 mg | Freq: Four times a day (QID) | INTRAMUSCULAR | Status: DC | PRN

## 2018-10-23 MED ORDER — PANTOPRAZOLE SODIUM 40 MG IV SOLR
40.0000 mg | Freq: Once | INTRAVENOUS | Status: AC
  Administered 2018-10-23: 40 mg via INTRAVENOUS
  Filled 2018-10-23: qty 40

## 2018-10-23 MED ORDER — SODIUM CHLORIDE 0.9 % IV BOLUS
1000.0000 mL | Freq: Once | INTRAVENOUS | Status: AC
  Administered 2018-10-23: 1000 mL via INTRAVENOUS

## 2018-10-23 MED ORDER — PRO-STAT SUGAR FREE PO LIQD
30.0000 mL | Freq: Two times a day (BID) | ORAL | Status: DC
  Administered 2018-10-23 – 2018-10-27 (×6): 30 mL via ORAL
  Filled 2018-10-23 (×5): qty 30

## 2018-10-23 MED ORDER — ACETAMINOPHEN 325 MG PO TABS: 650.0000 mg | ORAL_TABLET | Freq: Four times a day (QID) | ORAL | Status: DC | PRN

## 2018-10-23 MED ORDER — OXYCODONE HCL 5 MG PO TABS: 2.5000 mg | ORAL_TABLET | Freq: Four times a day (QID) | ORAL | Status: DC | PRN

## 2018-10-23 MED ORDER — ONDANSETRON HCL 4 MG PO TABS: 4.0000 mg | ORAL_TABLET | Freq: Four times a day (QID) | ORAL | Status: DC | PRN

## 2018-10-23 MED ORDER — PANTOPRAZOLE SODIUM 40 MG IV SOLR: 40.0000 mg | Freq: Two times a day (BID) | INTRAVENOUS | Status: DC

## 2018-10-23 MED ORDER — SODIUM CHLORIDE 0.9 % IV BOLUS
500.0000 mL | Freq: Once | INTRAVENOUS | Status: AC
  Administered 2018-10-23: 500 mL via INTRAVENOUS

## 2018-10-23 MED ORDER — SODIUM CHLORIDE 0.9 % IV SOLN
8.0000 mg/h | INTRAVENOUS | Status: DC
  Administered 2018-10-23 – 2018-10-26 (×5): 8 mg/h via INTRAVENOUS
  Filled 2018-10-23 (×8): qty 80

## 2018-10-23 MED ORDER — INSULIN ASPART 100 UNIT/ML ~~LOC~~ SOLN
0.0000 [IU] | Freq: Three times a day (TID) | SUBCUTANEOUS | Status: DC
  Administered 2018-10-24 – 2018-10-25 (×2): 1 [IU] via SUBCUTANEOUS
  Administered 2018-10-25: 5 [IU] via SUBCUTANEOUS
  Administered 2018-10-26: 1 [IU] via SUBCUTANEOUS
  Administered 2018-10-26: 2 [IU] via SUBCUTANEOUS
  Administered 2018-10-27: 3 [IU] via SUBCUTANEOUS
  Administered 2018-10-27: 1 [IU] via SUBCUTANEOUS

## 2018-10-23 MED ORDER — SODIUM CHLORIDE 0.9 % IV SOLN
80.0000 mg | Freq: Once | INTRAVENOUS | Status: AC
  Administered 2018-10-23: 80 mg via INTRAVENOUS
  Filled 2018-10-23: qty 80

## 2018-10-23 MED ORDER — SODIUM CHLORIDE 0.9 % IV SOLN
1.0000 g | INTRAVENOUS | Status: DC
  Administered 2018-10-24 – 2018-10-25 (×2): 1 g via INTRAVENOUS
  Filled 2018-10-23 (×2): qty 10

## 2018-10-23 MED ORDER — TAMSULOSIN HCL 0.4 MG PO CAPS
0.8000 mg | ORAL_CAPSULE | Freq: Every day | ORAL | Status: DC
  Administered 2018-10-23 – 2018-10-26 (×4): 0.8 mg via ORAL
  Filled 2018-10-23 (×4): qty 2

## 2018-10-23 NOTE — H&P (Addendum)
History and Physical  [name] DHR:[id number] DOB: 1941-07-20 DOA: 10/23/2018  Referring physician: Thurnell Garbe  PCP: Monico Blitz, MD   Chief Complaint: rectal bleeding   HPI: [name] is a 78 y.o. male with stage III CKD, type 2 diabetes mellitus, AAA, history of prostate cancer presented to ER with concerns about persistent black stools for the past 2 days.  He has had loose stools and generalized weakness over these past couple of days.  He has seen blood in the toilet after a bowel movement.  He denies abdominal pain.  He denies nausea and vomiting.  He denies chest pain and palpitations.  He denies shortness of breath.  He presented with EMS to the ED.  They noted that he was hypotensive on arrival to scene.  He was given a 500 mill bolus on route to ED.  His blood pressure was 88/52.  He responded to IV fluid hydration.  He was noted to be Hemoccult positive.  In addition, the patient takes aspirin daily in addition to diclofenac twice daily for arthritis pain.  He does have a history of significant osteoarthritis.  He was tested to be orthostatic positive in the ED.  He did respond to IV fluid hydration with improvement of his blood pressures.  He was noted to have worsening CKD and hyperkalemia.  He also was noted to have an elevated lactic acid at 2.7.  He was given IV fluid hydration for this.  The patient is having a difficult time standing.  He was noted to have an acute decline in hemoglobin from 13 down to 8.4.  He had a normal PT/INR.  He has also been reported to have had a large maroon-colored stool in the ED.  He was started on IV Protonix.  He has been typed and crossed.  He had a CT of the abdomen and pelvis without contrast with no acute findings noted.  He is being admitted to the stepdown unit.  GI has been consulted.  Review of Systems: All systems reviewed and apart from history of presenting illness, are negative.  Past Medical History:  Diagnosis Date  . AAA  (abdominal aortic aneurysm) ([place])   . COPD (chronic obstructive pulmonary disease) ([place])   . Coronary artery disease 2000   Status post CABG  . Diabetes mellitus    DM2  . Dyslipidemia   . Dyspnea   . History of kidney stones   . Hypertension   . Obesity   . Osteoarthritis   . Pacemaker   . Prostate cancer ([place])   . Sleep apnea    USING CPAP  . Spinal stenosis   . SSS (sick sinus syndrome) ([place]) 2000   s/p PPM   Past Surgical History:  Procedure Laterality Date  . BIOPSY PROSTATE  01/09/2018  . CATARACT EXTRACTION Left   . CORONARY ARTERY BYPASS GRAFT     2000  . JOINT REPLACEMENT     L hip Dr. Wynelle Link 07-18-17  . L5-S1 Gill decompressive laminectomy    . PACEMAKER INSERTION     MDT implanted for sick sinus syndrome  . PERMANENT PACEMAKER GENERATOR CHANGE N/A 03/30/2014   MDT Adapta L generator change by Dr Rayann Heman  . RADIOACTIVE SEED IMPLANT N/A 06/10/2018   Procedure: RADIOACTIVE SEED IMPLANT/BRACHYTHERAPY IMPLANT;  Surgeon: Cleon Gustin, MD;  Location: [facility];  Service: Urology;  Laterality: N/A;  . REPLACEMENT TOTAL KNEE BILATERAL     BIL  . Right total hip arthroplasty.    [name]  SPACE OAR INSTILLATION N/A 06/10/2018   Procedure: SPACE OAR INSTILLATION;  Surgeon: Cleon Gustin, MD;  Location: [facility];  Service: Urology;  Laterality: N/A;  . TOTAL HIP ARTHROPLASTY Left 07/18/2017   Procedure: LEFT TOTAL HIP ARTHROPLASTY ANTERIOR APPROACH;  Surgeon: [name], MD;  Location: [facility];  Service: Orthopedics;  Laterality: Left;  . TRANSRECTAL ULTRASOUND  01/09/2018   Social History:  reports that he quit smoking about 19 years ago. His smoking use included cigarettes. He started smoking about 57 years ago. He has a 42.00 pack-year smoking history. He has never used smokeless tobacco. He reports that he does not drink alcohol or use drugs.  No Known Allergies  Family History  Problem Relation Age of Onset  . CAD Father   . Stroke Father        Deceased, 74   . Hypertension Mother   . Stroke Mother        Deceased, 60  . Heart disease Mother   . Stroke Brother        Deceased, 27  . Hypertension Sister        Deceased, 58  . Heart disease Sister   . Healthy Son   . Breast cancer Daughter   . Prostate cancer Neg Hx   . Colon cancer Neg Hx   . Pancreatic cancer Neg Hx     Prior to Admission medications   Medication Sig Start Date End Date Taking? Authorizing Provider  amLODipine (NORVASC) 5 MG tablet Take 1 tablet (5 mg total) by mouth daily. Patient taking differently: Take 5 mg by mouth at bedtime.  05/03/18 10/23/18 Yes Allred, Jeneen Rinks, MD  aspirin EC 81 MG tablet Take 81 mg by mouth daily.   Yes [provider]  atorvastatin (LIPITOR) 40 MG tablet Take 40 mg by mouth daily.   Yes [provider]  diclofenac (VOLTAREN) 75 MG EC tablet Take 75 mg by mouth 2 (two) times daily.   Yes [provider]  glimepiride (AMARYL) 4 MG tablet Take 2 mg by mouth daily.    Yes [provider]  losartan (COZAAR) 100 MG tablet Take 100 mg by mouth daily.   Yes [provider]  metoprolol succinate (TOPROL-XL) 50 MG 24 hr tablet Take 50 mg by mouth daily.  06/07/17  Yes [provider]  Multiple Vitamins-Minerals (MULTIVITAMIN ADULTS PO) Take 1 tablet by mouth daily.    Yes [provider]  pioglitazone (ACTOS) 30 MG tablet Take 30 mg by mouth daily.    Yes [provider]  tamsulosin (FLOMAX) 0.4 MG CAPS capsule Take 2 capsules (0.8 mg total) by mouth daily after supper. 08/21/18  Yes Bruning, Ashlyn, PA-C  traMADol (ULTRAM) 50 MG tablet Take 1 tablet (50 mg total) by mouth every 6 (six) hours as needed. Patient not taking: Reported on 10/23/2018 06/10/18 06/10/19  Cleon Gustin, MD   Physical Exam: Vitals:   10/23/18 1300 10/23/18 1430 10/23/18 1513 10/23/18 1530  BP:  (!) 119/44 103/66 (!) 110/53  Pulse:  70 68 72  Resp:  (!) 24 (!) 26 (!) 22  Temp:      TempSrc:      SpO2:  98% 100% 100% 96%  Weight:      Height:         General exam: Moderately built and nourished patient, chronically ill-appearing male, lying comfortably supine on the gurney in no obvious distress.  Head, eyes and ENT: Nontraumatic and normocephalic. Pupils equally reacting  to light and accommodation. Oral mucosa moist.  Neck: Supple. No JVD, carotid bruit or thyromegaly.  Lymphatics: No lymphadenopathy.  Respiratory system: Clear to auscultation. No increased work of breathing.  Cardiovascular system: S1 and S2 heard, RRR. No JVD, murmurs, gallops, clicks or pedal edema.  Gastrointestinal system: Abdomen is nondistended, soft and nontender. Normal bowel sounds heard. No organomegaly or masses appreciated.  Central nervous system: Alert and oriented. No focal neurological deficits.  Extremities: Symmetric 5 x 5 power. Peripheral pulses symmetrically felt.   Skin: No rashes or acute findings.  Musculoskeletal system: Negative exam.  Psychiatry: Pleasant and cooperative.  Labs on Admission:  Basic Metabolic Panel: Recent Labs  Lab 10/23/18 0821  NA 135  K 5.8*  CL 108  CO2 19*  GLUCOSE 94  BUN 126*  CREATININE 3.89*  CALCIUM 8.3*   Liver Function Tests: Recent Labs  Lab 10/23/18 0821  AST 24  ALT 15  ALKPHOS 92  BILITOT 0.4  PROT 6.2*  ALBUMIN 3.0*   Recent Labs  Lab 10/23/18 0822  LIPASE 49   No results for input(s): AMMONIA in the last 168 hours. CBC: Recent Labs  Lab 10/23/18 0822 10/23/18 1445  WBC 8.7  --   NEUTROABS 7.3  --   HGB 8.4* 8.0*  HCT 26.3* 25.7*  MCV 93.6  --   PLT 183  --    Cardiac Enzymes: Recent Labs  Lab 10/23/18 0822  TROPONINI <0.03    BNP (last 3 results) No results for input(s): PROBNP in the last 8760 hours. CBG: No results for input(s): GLUCAP in the last 168 hours.  Radiological Exams on Admission: Ct Abdomen Pelvis Wo Contrast  Result Date: 10/23/2018 CLINICAL DATA:  Black tarry stools EXAM: CT ABDOMEN  AND PELVIS WITHOUT CONTRAST TECHNIQUE: Multidetector CT imaging of the abdomen and pelvis was performed following the standard protocol without IV contrast. COMPARISON:  03/07/2018 FINDINGS: Lower chest: No acute abnormality. Hepatobiliary: Liver is well visualized and within normal limits. The gallbladder is well distended with multiple dependent densities consistent with small stones. This is stable from the previous exam. Pancreas: Scattered calcifications are noted within the pancreas stable from the previous exam. No mass lesion is noted. Spleen: Normal in size without focal abnormality. Adrenals/Urinary Tract: Adrenal glands are within normal limits bilaterally. Renal cysts are seen bilaterally as well as multiple nonobstructing renal stones on the right. The largest of these measures 9 mm in greatest dimension. Bladder is decompressed. No ureteral stones are seen. Stomach/Bowel: Gaseous distension of the transverse colon is again noted. The remainder of the colon is within normal limits. The appendix is not well visualized Vascular/Lymphatic: Diffuse aortic calcifications are noted. 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 Mild aneurysmal dilatation of the abdominal aorta is again seen. Stable lymph nodes are noted in the periaortic region and right common iliac chain no new significant lymphadenopathy is noted. Reproductive: Brachytherapy seeds are noted throughout the prostate. Other: No abdominal wall hernia or abnormality. No abdominopelvic ascites. Musculoskeletal: Degenerative and postoperative changes of the thoracolumbar spine are seen. Bilateral hip replacements are noted. IMPRESSION: Stable renal calculi particularly on the right with stable renal cystic change. Cholelithiasis without complicating factors. Infrarenal abdominal aorta stable from the previous exam. Stable small lymph nodes. No significant increase in lymphadenopathy is noted. Stable gaseous distension of the  transverse colon. No definitive mass is seen. Electronically Signed   By: Inez Catalina M.D.   On: 10/23/2018 10:32   Dg Chest 2  View  Result Date: 10/23/2018 CLINICAL DATA:  Rectal pain and blood in stool. History of prostate cancer. EXAM: CHEST - 2 VIEW COMPARISON:  PA and lateral chest 05/09/2018. FINDINGS: Mild left basilar atelectasis or scar is seen. The right lung is clear. No pneumothorax or pleural effusion. Heart size is upper normal. The patient is status post CABG. Pacing device is in place. Thoracolumbar fusion hardware is noted. IMPRESSION: No acute disease. Electronically Signed   By: Inge Rise M.D.   On: 10/23/2018 09:52   EKG: Independently reviewed.  Atrial paced rhythm.  Assessment/Plan Principal Problem:   Acute GI bleeding Active Problems:   Hyperkalemia   Coronary atherosclerosis   SSS (sick sinus syndrome) ([facility])   Right foot drop   Diabetic polyneuropathy associated with diabetes mellitus due to underlying condition ([place])   Malignant neoplasm of prostate ([facility])   Hypotension   AAA (abdominal aortic aneurysm) ([facility])   Sleep apnea   Type 2 Diabetes mellitus   COPD (chronic obstructive pulmonary disease) ([facility])   Pacemaker   Passage of bloody stools   CKD (chronic kidney disease), stage III ([facility])   Acute renal failure superimposed on stage 3 chronic kidney disease ([facility])   UTI (urinary tract infection)   Sepsis ([facility])   Elevated lactic acid level   Pressure injury of skin  1. Upper GI bleeding- suspect patient has bleeding from chronic NSAID use for his osteoarthritis symptoms.  He is hypotensive and may require transfusion.  Monitor hemoglobin closely.  Admit to stepdown unit.  Continue IV fluid hydration and IV Protonix infusion.  Clear liquid diet ordered.  GI consultation appreciated.  EGD 10/24/2018.  He reportedly had an EGD done several years ago in [place] but is not able to give more history. 2. Sepsis- patient presented with hypotension, elevated lactic  acid and findings of a UTI.  He has been started on IV ceftriaxone, follow urine and blood cultures. 3. Acute on chronic CKD stage III- creatinine has worsened possibly secondary to dehydration although he has had treatments for prostate cancer.  Hydrate with IV fluids and follow renal function panel.  Renally dose medications.  He may not be appropriate to restart on NSAIDs due to renal insufficiency. 4. Hyperkalemia - lokelma ordered x 2 doses. Recheck in AM.  5. Hemoccult positive stools with GI bleeding- monitoring closely in stepdown unit, treating supportively and transfuse as needed. 6. Acute blood loss anemia - Pt had large bloody bowel movement in ED.  Critical blood shortage now, transfuse for Hg<7.    7. CAD - due to hypotension and GI bleeding, holding aspirin and anti-hypertensives on admission.  8. Hypotension - secondary to sepsis, acute blood loss anemia -holding antihypertensives at this time.  Follow closely in stepdown unit.  Bolus fluids and transfuse PRBC if needed. 9. UTI- he is having dysuria, he has been on 2 recent outpatient antibiotic therapies most recently bactrim DS but patient says the symptoms have not resolved, culture urine, continue ceftriaxone as ordered. 10. Type 2 diabetes mellitus-holding home oral medications and have started sensitive sliding scale coverage.  Monitor CBG 5 times daily. 11. History of prostate cancer - he has been treated with seed radiation in November 2019.    DVT Prophylaxis: SCDs Code Status: Full   Family Communication: patient   Disposition Plan: Stepdown ICU   Critical CareTime spent: 63 mins  Clanford Johnson, MD [facility] How to contact the [facility] Attending or Consulting provider [place] or covering provider during  after hours 7P -7A, for this patient?  1. Check the care team in [facility] and look for a) attending/consulting [facility] provider listed and b) the [facility] team listed 2. Log into [email or web address] and use [place] universal  password to access. If you do not have the password, please contact the hospital operator. 3. Locate the [facility] provider you are looking for under [facility] and page to a number that you can be directly reached. 4. If you still have difficulty reaching the provider, please page the [facility] (Director on Call) for the Hospitalists listed on amion for assistance.

## 2018-10-23 NOTE — ED Notes (Signed)
CRITICAL VALUE ALERT  Critical Value:  Lactic Acid 2.7  Date & Time Notied:  10/23/18 0915   Provider Notified: Dr. Thurnell Garbe  Orders Received/Actions taken: EDP notified

## 2018-10-23 NOTE — Progress Notes (Signed)
Initial Nutrition Assessment  DOCUMENTATION CODES:  Obesity unspecified  INTERVENTION:  Boost Breeze po TID, each supplement provides 250 kcal and 9 grams of protein  Will order 30 mL Prostat BID, each supplement provides 100 kcal and 15 grams of protein.  NUTRITION DIAGNOSIS:  Increased nutrient needs related to acute illness (sepsis 2/2 uti) and underlying cancer as evidenced by estimated nutritional requirements for these coexisting conditions.   GOAL: Patient will meet greater than or equal to 90% of their needs  MONITOR:  PO intake, Supplement acceptance, Weight trends, Labs, Diet advancement, I & O's  REASON FOR ASSESSMENT:  Malnutrition Screening Tool    ASSESSMENT:  78 y/o male PMHx CKD3, DM2, AAA, prostate cancer. Presented to ED w/ melena, weakness and loose stools x2 days. In ED, pt met sepsis criteria w/ UA suggesting UTI. He was also hemoccult positive. Admitted for management of suspected UGI bleed, aki and uti related sepsis.   RD operating remotely d/t covid restrictions.   Pt screened positive on MST, reportedly losing a large amount of weight unintentionally, but without any decrease in PO intake.  Per chart, pt had been maintaining a weight around 230-240 lbs up until this past November. Pt was diagnosed with prostate cancer this past summer and had radioactive seeding performed in November. His wt was in the 230s at that time and now appears at a weight of 208 lbs; there is no interim weight measurements to help narrow acuity of loss  Given his report of no troubles with oral intake - could be d/t hypermetabolism, metabolic dysregulation or decreased absorption. While he is restricted to a clear liquid diet, will add boost breeze. Given his hyperkalemia, this is likely the optimal choice at this time anyway as this supplement is potassium free. Will add prostat to help pt meet protein needs.   Labs Lactic: 2.7-> 2.1, Albumin:3.0, hgb:8.4, BUn/Creat:126/3.89,  Glucose:94 Meds: PPI, IVF, IV abx  Recent Labs  Lab 10/23/18 0821  NA 135  K 5.8*  CL 108  CO2 19*  BUN 126*  CREATININE 3.89*  CALCIUM 8.3*  GLUCOSE 94   NUTRITION - FOCUSED PHYSICAL EXAM: Unable to conduct  Diet Order:   Diet Order            Diet clear liquid Room service appropriate? Yes; Fluid consistency: Thin  Diet effective now             EDUCATION NEEDS:  Not appropriate for education at this time  Skin:  Skin Assessment: Reviewed RN Assessment  Last BM:  3/25 (liquid w/ clots)  Height:  Ht Readings from Last 1 Encounters:  10/23/18 5' 7" (1.702 m)   Weight:  Wt Readings from Last 1 Encounters:  10/23/18 94.5 kg   Wt Readings from Last 10 Encounters:  10/23/18 94.5 kg  06/10/18 106.1 kg  06/03/18 106.1 kg  05/03/18 107.5 kg  03/20/18 105.2 kg  03/14/18 107.5 kg  01/17/18 103.9 kg  07/18/17 101.2 kg  07/11/17 101.2 kg  05/28/17 101.2 kg   Ideal Body Weight:  67.27 kg  BMI:  Body mass index is 32.63 kg/m.  Estimated Nutritional Needs:  Kcal:  1800-2000 kcals (19-21 kcal/kg bw) Protein:  87-100g pro (1.3-1.5g/kg ibw) Fluid:  1.8-2L fluid (72m/kcal)  NBurtis JunesRD, LDN, CNSC Clinical Nutrition Available Tues-Sat via Pager: [phone]/25/2020 2:15 PM

## 2018-10-23 NOTE — ED Notes (Signed)
Pt had a large, maroon colored stool. Stool was liquid consistency with clots present.

## 2018-10-23 NOTE — ED Provider Notes (Signed)
[facility] EMERGENCY DEPARTMENT Provider Note   CSN: [id number] Arrival date & time: 10/23/18  0757    History   Chief Complaint Chief Complaint  Patient presents with   Rectal Bleeding    HPI [name] is a 78 y.o. male.     HPI Pt was seen at [facility].  Per pt, c/o gradual onset and worsening of persistent "black stools" for the past 2 days. Has been associated with diarrhea and generalized weakness. Today, pt states he wiped after a BM and "saw red blood." Endorses hx of colonoscopy several years ago in [place]. Denies abd pain, no N/V, no back pain, no CP/palpitations, no SOB/cough, no fevers, no rash, no focal motor weakness. EMS states pt's BP was "88/52" on their arrival to scene and gave IVF NS 542ml bolus en route.    Past Medical History:  Diagnosis Date   AAA (abdominal aortic aneurysm) (HCC)    COPD (chronic obstructive pulmonary disease) (HCC)    Coronary artery disease 2000   Status post CABG   Diabetes mellitus    DM2   Dyslipidemia    Dyspnea    History of kidney stones    Hypertension    Obesity    Osteoarthritis    Pacemaker    Prostate cancer ([place])    Sleep apnea    USING CPAP   Spinal stenosis    SSS (sick sinus syndrome) ([place]) 2000   s/p PPM    Patient Active Problem List   Diagnosis Date Noted   Malignant neoplasm of prostate ([place]) 03/13/2018   OA (osteoarthritis) of hip 07/18/2017   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 Wynelle Link 07-18-17   L5-S1 Gordy Levan decompressive laminectomy     PACEMAKER INSERTION     MDT implanted for sick sinus syndrome   PERMANENT PACEMAKER GENERATOR CHANGE N/A 03/30/2014   MDT Adapta L generator change by Dr Rayann Heman   RADIOACTIVE SEED IMPLANT N/A 06/10/2018   Procedure: RADIOACTIVE SEED IMPLANT/BRACHYTHERAPY IMPLANT;  Surgeon: Cleon Gustin, MD;  Location: [facility];  Service: Urology;  Laterality: N/A;   REPLACEMENT TOTAL KNEE BILATERAL     BIL   Right total hip arthroplasty.     SPACE OAR INSTILLATION N/A 06/10/2018   Procedure: SPACE OAR INSTILLATION;  Surgeon: Cleon Gustin, MD;  Location: [facility];  Service: Urology;  Laterality: N/A;   TOTAL HIP ARTHROPLASTY Left 07/18/2017   Procedure: LEFT TOTAL HIP ARTHROPLASTY ANTERIOR APPROACH;  Surgeon: [name], MD;  Location: [facility];  Service: Orthopedics;  Laterality: Left;   TRANSRECTAL ULTRASOUND  01/09/2018        Home Medications    Prior to Admission medications   Medication Sig Start Date End Date Taking? Authorizing Provider  amLODipine (NORVASC) 5 MG tablet Take 1 tablet (5 mg total) by mouth daily. Patient taking differently: Take 5 mg by mouth at bedtime.  05/03/18 08/01/18  Thompson Grayer, MD  aspirin  EC 81 MG tablet Take 81 mg by mouth daily.    [provider]  atorvastatin (LIPITOR) 40 MG tablet Take 40 mg by mouth daily.    [provider]  carboxymethylcellulose (REFRESH PLUS) 0.5 % SOLN Place 1 drop into both eyes daily as needed (dry eyes).    [provider]  Coenzyme Q10 (CO Q 10 PO) Take 200 mg by mouth daily.    [provider]  diclofenac (VOLTAREN) 75 MG EC tablet Take 75 mg by mouth 2 (two) times daily.    [provider]  glimepiride (AMARYL) 4 MG tablet Take 2 mg by mouth daily.     [provider]  losartan (COZAAR) 100 MG tablet Take 100 mg by mouth daily.    [provider]  metoprolol succinate (TOPROL-XL) 50 MG 24 hr tablet Take 50 mg by mouth daily.  06/07/17   [provider]  Multiple Vitamins-Minerals (MULTIVITAMIN ADULTS PO) Take 1 tablet by mouth daily.     [provider]  Multiple Vitamins-Minerals (PRESERVISION AREDS 2 PO) Take 1 tablet by mouth 2 (two) times daily.    [provider]  pioglitazone (ACTOS) 30 MG tablet Take 30 mg by mouth daily.     [provider]  tamsulosin (FLOMAX) 0.4 MG CAPS capsule Take 2 capsules (0.8 mg total) by mouth daily after supper. 08/21/18   Bruning, Ashlyn, PA-C  traMADol (ULTRAM) 50 MG tablet Take 1 tablet (50 mg total) by mouth every 6 (six) hours as needed. 06/10/18 06/10/19  McKenzieCandee Furbish, MD    Family History Family History  Problem Relation Age of Onset   CAD Father    Stroke Father        Deceased, 81   Hypertension Mother    Stroke Mother        Deceased, 49   Heart disease Mother    Stroke Brother        Deceased, 8   Hypertension Sister        Deceased, 78   Heart disease Sister    Healthy Son    Breast cancer Daughter    Prostate cancer Neg Hx    Colon cancer Neg Hx    Pancreatic cancer Neg Hx     Social History Social History   Tobacco Use   Smoking status: Former Smoker    Packs/day: 1.00    Years: 42.00    Pack years: 42.00    Types: Cigarettes    Start date: 01/28/1961    Last attempt to quit: 10/30/1998    Years since quitting: 19.9   Smokeless tobacco: Never Used  Substance Use Topics   Alcohol use: No    Alcohol/week: 0.0 standard drinks   Drug use: No     Allergies   Patient has no known allergies.   Review of Systems Review of Systems ROS: Statement: All systems negative except as marked or noted in the HPI; Constitutional: Negative for fever and chills. +generalized weakness.; ; Eyes: Negative for eye pain, redness and discharge. ; ; ENMT: Negative for ear pain, hoarseness, nasal congestion,  sinus pressure and sore throat. ; ; Cardiovascular: Negative for chest pain, palpitations, diaphoresis, dyspnea and peripheral edema. ; ; Respiratory: Negative for cough, wheezing and stridor. ; ; Gastrointestinal: Negative for nausea, vomiting, abdominal pain, hematemesis, jaundice and +"black stools," diarrhea, rectal bleeding. . ; ; Genitourinary: Negative for dysuria, flank pain and hematuria. ; ; Musculoskeletal: Negative for back pain and neck  pain. Negative for swelling and trauma.; ; Skin: Negative for pruritus, rash, abrasions, blisters, bruising and skin lesion.; ; Neuro: Negative for headache, lightheadedness and neck stiffness. Negative for altered level of consciousness, altered mental status, extremity weakness, paresthesias, involuntary movement, seizure and syncope.       Physical Exam Updated Vital Signs BP 105/88    Pulse 66    Temp 97.9 F (36.6 C) (Oral)    Resp 18    Ht 5\' 7"  (1.702 m)    Wt 98.4 kg    SpO2 100%    BMI 33.99 kg/m    Patient Vitals for the past 24 hrs:  BP Temp Temp src Pulse Resp SpO2 Height Weight  10/23/18 1052 (!) 148/79 -- -- 74 17 93 % -- --  10/23/18 1030 (!) 108/55 -- -- 72 (!) 23 100 % -- --  10/23/18 1015 (!) 121/55 -- -- 74 17 100 % -- --  10/23/18 1000 (!) 112/91 -- -- 72 16 100 % -- --  10/23/18 0930 105/88 -- -- 66 18 100 % -- --  10/23/18 0840 114/87 -- -- -- (!) 24 -- -- --  10/23/18 0837 139/89 -- -- 71 20 100 % -- --  10/23/18 0815 -- -- -- -- 18 -- -- --  10/23/18 0802 -- -- -- -- -- -- 5\' 7"  (1.702 m) 98.4 kg  10/23/18 0800 (!) 90/59 97.9 F (36.6 C) Oral -- -- -- -- --   08:38 Orthostatic Vital Signs FS  Orthostatic Lying   BP- Lying: 139/89  Pulse- Lying: 72      Orthostatic Sitting  BP- Sitting: 114/87  Pulse- Sitting: 80      Orthostatic Standing at 0 minutes  BP- Standing at 0 minutes: (could not take accurate bp. needed max assist to stand.)     Physical Exam 0820: Physical examination:  Nursing notes reviewed;  Vital signs and O2 SAT reviewed;  Constitutional: Well developed, Well nourished, In no acute distress; Head:  Normocephalic, atraumatic; Eyes: EOMI, PERRL, No scleral icterus; ENMT: Mouth and pharynx normal, Mucous membranes dry; Neck: Supple, Full range of motion, No lymphadenopathy; Cardiovascular: Regular rate and rhythm, No gallop; Respiratory: Breath sounds clear & equal bilaterally, No wheezes.  Speaking full sentences with ease, Normal respiratory effort/excursion; Chest: Nontender, Movement normal; Abdomen: Soft, Nontender, Nondistended, Normal bowel sounds. Rectal exam performed w/permission of pt and ED RN chaperone present.  Anal tone normal.  Non-tender, scant stool/mucus in rectal vault, heme positive.  No fissures, no external hemorrhoids, no palp masses.; Genitourinary: No CVA tenderness; Extremities: Peripheral pulses normal, No tenderness, No edema, No calf edema or asymmetry.; Neuro: AA&Ox3, Major CN grossly intact.  Speech clear. No gross focal motor deficits in extremities.; Skin: Color pale, Warm, Dry.     ED Treatments / Results  Labs (all labs ordered are listed, but only abnormal results are displayed)   EKG EKG Interpretation  Date/Time:  Wednesday October 23 2018 08:03:05 EDT Ventricular Rate:  81 PR Interval:    QRS Duration: 95 QT Interval:  370 QTC Calculation: 430 R Axis:   64 Text Interpretation:  Atrial-paced rhythm Borderline repolarization abnormality When compared with ECG of 11/26/2007 No significant change was found Confirmed by Francine Graven [phone]) on 10/23/2018 8:25:15 AM   Radiology   Procedures Procedures (including critical care time)  Medications Ordered in ED Medications  0.9 %  sodium chloride infusion (has no administration in time range)  sodium chloride 0.9 % bolus 1,000 mL (1,000  mLs Intravenous New Bag/Given 10/23/18 0902)     Initial Impression / Assessment and Plan / ED Course  I have reviewed the triage vital signs and the  nursing notes.  Pertinent labs & imaging results that were available during my care of the patient were reviewed by me and considered in my medical decision making (see chart for details).     MDM Reviewed: previous chart, nursing note and vitals Reviewed previous: labs and ECG Interpretation: labs, ECG, x-ray and CT scan Total time providing critical care: 30-74 minutes. This excludes time spent performing separately reportable procedures and services. Consults: admitting MD   CRITICAL CARE Performed by: Francine Graven Total critical care time: 35 minutes Critical care time was exclusive of separately billable procedures and treating other patients. Critical care was necessary to treat or prevent imminent or life-threatening deterioration. Critical care was time spent personally by me on the following activities: development of treatment plan with patient and/or surrogate as well as nursing, discussions with consultants, evaluation of patient's response to treatment, examination of patient, obtaining history from patient or surrogate, ordering and performing treatments and interventions, ordering and review of laboratory studies, ordering and review of radiographic studies, pulse oximetry and re-evaluation of patient's condition.   Results for orders placed or performed during the hospital encounter of 10/23/18  Comprehensive metabolic panel  Result Value Ref Range   Sodium 135 135 - 145 mmol/L   Potassium 5.8 (H) 3.5 - 5.1 mmol/L   Chloride 108 98 - 111 mmol/L   CO2 19 (L) 22 - 32 mmol/L   Glucose, Bld 94 70 - 99 mg/dL   BUN 126 (H) 8 - 23 mg/dL   Creatinine, Ser 3.89 (H) 0.61 - 1.24 mg/dL   Calcium 8.3 (L) 8.9 - 10.3 mg/dL   Total Protein 6.2 (L) 6.5 - 8.1 g/dL   Albumin 3.0 (L) 3.5 - 5.0 g/dL   AST 24 15 - 41 U/L   ALT 15 0 - 44 U/L   Alkaline Phosphatase 92 38 - 126 U/L   Total Bilirubin 0.4 0.3 - 1.2 mg/dL   GFR calc non Af Amer 14 (L) >60 mL/min   GFR calc Af Amer 16  (L) >60 mL/min   Anion gap 8 5 - 15  Lactic acid, plasma  Result Value Ref Range   Lactic Acid, Venous 2.7 (HH) 0.5 - 1.9 mmol/L  Troponin I - Once  Result Value Ref Range   Troponin I <0.03 <0.03 ng/mL  CBC with Differential  Result Value Ref Range   WBC 8.7 4.0 - 10.5 K/uL   RBC 2.81 (L) 4.22 - 5.81 MIL/uL   Hemoglobin 8.4 (L) 13.0 - 17.0 g/dL   HCT 26.3 (L) 39.0 - 52.0 %   MCV 93.6 80.0 - 100.0 fL   MCH 29.9 26.0 - 34.0 pg   MCHC 31.9 30.0 - 36.0 g/dL   RDW 15.7 (H) 11.5 - 15.5 %   Platelets 183 150 - 400 K/uL   nRBC 0.0 0.0 - 0.2 %   Neutrophils Relative % 85 %   Neutro Abs 7.3 1.7 - 7.7 K/uL   Lymphocytes Relative 7 %   Lymphs Abs 0.6 (L) 0.7 - 4.0 K/uL   Monocytes Relative 8 %   Monocytes Absolute 0.7 0.1 - 1.0 K/uL   Eosinophils Relative 0 %   Eosinophils Absolute 0.0 0.0 - 0.5 K/uL   Basophils Relative 0 %   Basophils Absolute 0.0 0.0 - 0.1 K/uL   Immature Granulocytes  0 %   Abs Immature Granulocytes 0.03 0.00 - 0.07 K/uL  Protime-INR  Result Value Ref Range   Prothrombin Time 14.4 11.4 - 15.2 seconds   INR 1.1 0.8 - 1.2  Lipase, blood  Result Value Ref Range   Lipase 49 11 - 51 U/L  POC occult blood, ED  Result Value Ref Range   Fecal Occult Bld POSITIVE (A) NEGATIVE  Type and screen [facility]  Result Value Ref Range   ABO/RH(D) O POS    Antibody Screen NEG    Sample Expiration      10/26/2018 Performed at [facility], [address] [place], [place] [address]    Dg Chest 2 View Result Date: 10/23/2018 CLINICAL DATA:  Rectal pain and blood in stool. History of prostate cancer. EXAM: CHEST - 2 VIEW COMPARISON:  PA and lateral chest 05/09/2018. FINDINGS: Mild left basilar atelectasis or scar is seen. The right lung is clear. No pneumothorax or pleural effusion. Heart size is upper normal. The patient is status post CABG. Pacing device is in place. Thoracolumbar fusion hardware is noted. IMPRESSION: No acute disease. Electronically Signed   By:  Inge Rise M.D.   On: 10/23/2018 09:52   Ct Abdomen Pelvis Wo Contrast Result Date: 10/23/2018 CLINICAL DATA:  Black tarry stools EXAM: CT ABDOMEN AND PELVIS WITHOUT CONTRAST TECHNIQUE: Multidetector CT imaging of the abdomen and pelvis was performed following the standard protocol without IV contrast. COMPARISON:  03/07/2018 FINDINGS: Lower chest: No acute abnormality. Hepatobiliary: Liver is well visualized and within normal limits. The gallbladder is well distended with multiple dependent densities consistent with small stones. This is stable from the previous exam. Pancreas: Scattered calcifications are noted within the pancreas stable from the previous exam. No mass lesion is noted. Spleen: Normal in size without focal abnormality. Adrenals/Urinary Tract: Adrenal glands are within normal limits bilaterally. Renal cysts are seen bilaterally as well as multiple nonobstructing renal stones on the right. The largest of these measures 9 mm in greatest dimension. Bladder is decompressed. No ureteral stones are seen. Stomach/Bowel: Gaseous distension of the transverse colon is again noted. The remainder of the colon is within normal limits. The appendix is not well visualized Vascular/Lymphatic: Diffuse aortic calcifications are noted. 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 Mild aneurysmal dilatation of the abdominal aorta is again seen. Stable lymph nodes are noted in the periaortic region and right common iliac chain no new significant lymphadenopathy is noted. Reproductive: Brachytherapy seeds are noted throughout the prostate. Other: No abdominal wall hernia or abnormality. No abdominopelvic ascites. Musculoskeletal: Degenerative and postoperative changes of the thoracolumbar spine are seen. Bilateral hip replacements are noted. IMPRESSION: Stable renal calculi particularly on the right with stable renal cystic change. Cholelithiasis without complicating factors. Infrarenal abdominal  aorta stable from the previous exam. Stable small lymph nodes. No significant increase in lymphadenopathy is noted. Stable gaseous distension of the transverse colon. No definitive mass is seen. Electronically Signed   By: Inez Catalina M.D.   On: 10/23/2018 10:32     Results for [name], [name] (MRN [id number]) as of 10/23/2018 09:57  Ref. Range 07/19/2017 05:33 06/03/2018 14:17 06/10/2018 11:36 10/23/2018 08:22  Hemoglobin Latest Ref Range: 13.0 - 17.0 g/dL 9.8 (L) 13.2 13.1 8.4 (L)  HCT Latest Ref Range: 39.0 - 52.0 % 29.6 (L) 41.4 40.2 26.3 (L)   Results for [name], [name] (MRN [id number]) as of 10/23/2018 09:57  Ref. Range 03/07/2018 08:17 06/03/2018 14:17 06/10/2018 11:36 10/23/2018  08:21  BUN Latest Ref Range: 8 - 23 mg/dL  38 (H) 45 (H) 126 (H)  Creatinine Latest Ref Range: 0.61 - 1.24 mg/dL 2.00 (H) 1.85 (H) 2.02 (H) 3.89 (H)    1055:  H/H lower than previous; T&S ordered. BUN/Cr elevated; possible upper GIB so IV protonix given. Abd remains benign, resps easy, NAD.  Unable to stand for orthostatic VS due to generalized weakness; IVF bolus given for soft BP's with improvement.  T/C returned from GI Dr. Laural Golden, case discussed, including:  HPI, pertinent PM/SHx, VS/PE, dx testing, ED course and treatment:  Agreeable to consult, requests OK to have clears, likely EGD tomorrow.  1115:  T/C returned from [facility] Dr. Wynetta Emery, case discussed, including:  HPI, pertinent PM/SHx, VS/PE, dx testing, ED course and treatment, as well as d/w GI MD:  Agreeable to admit.    Final Clinical Impressions(s) / ED Diagnoses   Final diagnoses:  None    ED Discharge Orders    None       Francine Graven, DO 10/28/18 1743

## 2018-10-23 NOTE — ED Triage Notes (Signed)
Per EMS: called to pts house due to bloody stools. Patient complains of rectal pain and history prostate cancer, CHF. EMS reports BP of 88/52.  pt received 500 cc NS bolus.

## 2018-10-23 NOTE — Consult Note (Signed)
Reason for Consult: GI bleed Referring Physician:   [name] is an 78 y.o. male.  HPI:  Patient seen and examined in the ED. Present to the ED after having black stools x 2 days. This morning he saw [facility]. RN assigned to patient states patient had a large marooned BM while in the ED. Last colonoscopy was about 5 yrs ago by Dr. Posey Nolan (Will try to locate). He enies any hematemesis. No GERD. On a Baby ASA. No other NSAIDs. Took Pepto about 2 weeks ago. Up until 2 days ago, he says he was fine. Hx of Prostate cancer. Seeds implanted in November. Since seeds implanted, he has lost about 35-45 pounds.  He was orthostatic in the ED and received a liter of fluid. Received Protonix 40mg  IV while in the ED.  Has had a drop in his hemoglobin since November.     CBC Latest Ref Rng & Units 10/23/2018 06/10/2018 06/03/2018  WBC 4.0 - 10.5 K/uL 8.7 6.5 6.5  Hemoglobin 13.0 - 17.0 g/dL 8.4(L) 13.1 13.2  Hematocrit 39.0 - 52.0 % 26.3(L) 40.2 41.4  Platelets 150 - 400 K/uL 183 162 159          Past Medical History:  Diagnosis Date  . AAA (abdominal aortic aneurysm) ([place])   . COPD (chronic obstructive pulmonary disease) ([place])   . Coronary artery disease 2000   Status post CABG  . Diabetes mellitus    DM2  . Dyslipidemia   . Dyspnea   . History of kidney stones   . Hypertension   . Obesity   . Osteoarthritis   . Pacemaker   . Prostate cancer ([place])   . Sleep apnea    USING CPAP  . Spinal stenosis   . SSS (sick sinus syndrome) ([place]) 2000   s/p PPM    Past Surgical History:  Procedure Laterality Date  . BIOPSY PROSTATE  01/09/2018  . CATARACT EXTRACTION Left   . CORONARY ARTERY BYPASS GRAFT     2000  . JOINT REPLACEMENT     L hip Dr. Wynelle Link 07-18-17  . L5-S1 Gill decompressive laminectomy    . PACEMAKER INSERTION     MDT implanted for sick sinus syndrome  . PERMANENT PACEMAKER GENERATOR CHANGE N/A 03/30/2014   MDT Adapta L generator change by Dr [name]  . RADIOACTIVE  SEED IMPLANT N/A 06/10/2018   Procedure: RADIOACTIVE SEED IMPLANT/BRACHYTHERAPY IMPLANT;  Surgeon: Sean Gustin, MD;  Location: [facility];  Service: Urology;  Laterality: N/A;  . REPLACEMENT TOTAL KNEE BILATERAL     BIL  . Right total hip arthroplasty.    . SPACE OAR INSTILLATION N/A 06/10/2018   Procedure: SPACE OAR INSTILLATION;  Surgeon: Sean Gustin, MD;  Location: [facility];  Service: Urology;  Laterality: N/A;  . TOTAL HIP ARTHROPLASTY Left 07/18/2017   Procedure: LEFT TOTAL HIP ARTHROPLASTY ANTERIOR APPROACH;  Surgeon: Sean Arabian, MD;  Location: [facility];  Service: Orthopedics;  Laterality: Left;  . TRANSRECTAL ULTRASOUND  01/09/2018    Family History  Problem Relation Age of Onset  . CAD Father   . Stroke Father        Deceased, 32  . Hypertension Mother   . Stroke Mother        Deceased, 16  . Heart disease Mother   . Stroke Brother        Deceased, 23  . Hypertension Sister        Deceased, 20  . Heart disease Sister   .  Healthy Son   . Breast cancer Daughter   . Prostate cancer Neg Hx   . Colon cancer Neg Hx   . Pancreatic cancer Neg Hx     Social History:  reports that he quit smoking about 19 years ago. His smoking use included cigarettes. He started smoking about 57 years ago. He has a 42.00 pack-year smoking history. He has never used smokeless tobacco. He reports that he does not drink alcohol or use drugs.  Allergies: No Known Allergies  Medications: I have reviewed the patient's current medications.  Results for orders placed or performed during the hospital encounter of 10/23/18 (from the past 48 hour(s))  Comprehensive metabolic panel     Status: Abnormal   Collection Time: 10/23/18  8:21 AM  Result Value Ref Range   Sodium 135 135 - 145 mmol/L   Potassium 5.8 (H) 3.5 - 5.1 mmol/L   Chloride 108 98 - 111 mmol/L   CO2 19 (L) 22 - 32 mmol/L   Glucose, Bld 94 70 - 99 mg/dL   BUN 126 (H) 8 - 23 mg/dL    Comment: RESULTS CONFIRMED BY MANUAL  DILUTION   Creatinine, Ser 3.89 (H) 0.61 - 1.24 mg/dL   Calcium 8.3 (L) 8.9 - 10.3 mg/dL   Total Protein 6.2 (L) 6.5 - 8.1 g/dL   Albumin 3.0 (L) 3.5 - 5.0 g/dL   AST 24 15 - 41 U/L   ALT 15 0 - 44 U/L   Alkaline Phosphatase 92 38 - 126 U/L   Total Bilirubin 0.4 0.3 - 1.2 mg/dL   GFR calc non Af Amer 14 (L) >60 mL/min   GFR calc Af Amer 16 (L) >60 mL/min   Anion gap 8 5 - 15    Comment: Performed at [facility] [place], [address], [place], [place] [address]  Type and screen [facility]     Status: None   Collection Time: 10/23/18  8:22 AM  Result Value Ref Range   ABO/RH(D) O POS    Antibody Screen NEG    Sample Expiration      10/26/2018 Performed at [facility], [address] [place], [place] [address]   Troponin I - Once     Status: None   Collection Time: 10/23/18  8:22 AM  Result Value Ref Range   Troponin I <0.03 <0.03 ng/mL    Comment: Performed at [facility], [address] [place],  [address]  CBC with Differential     Status: Abnormal   Collection Time: 10/23/18  8:22 AM  Result Value Ref Range   WBC 8.7 4.0 - 10.5 K/uL   RBC 2.81 (L) 4.22 - 5.81 MIL/uL   Hemoglobin 8.4 (L) 13.0 - 17.0 g/dL   HCT 26.3 (L) 39.0 - 52.0 %   MCV 93.6 80.0 - 100.0 fL   MCH 29.9 26.0 - 34.0 pg   MCHC 31.9 30.0 - 36.0 g/dL   RDW 15.7 (H) 11.5 - 15.5 %   Platelets 183 150 - 400 K/uL   nRBC 0.0 0.0 - 0.2 %   Neutrophils Relative % 85 %   Neutro Abs 7.3 1.7 - 7.7 K/uL   Lymphocytes Relative 7 %   Lymphs Abs 0.6 (L) 0.7 - 4.0 K/uL   Monocytes Relative 8 %   Monocytes Absolute 0.7 0.1 - 1.0 K/uL   Eosinophils Relative 0 %   Eosinophils Absolute 0.0 0.0 - 0.5 K/uL   Basophils Relative 0 %   Basophils Absolute 0.0  0.0 - 0.1 K/uL   Immature Granulocytes 0 %   Abs Immature Granulocytes 0.03 0.00 - 0.07 K/uL    Comment: Performed at [facility], [address], [place], [place] [address]  Protime-INR     Status: None   Collection Time: 10/23/18  8:22 AM  Result Value  Ref Range   Prothrombin Time 14.4 11.4 - 15.2 seconds   INR 1.1 0.8 - 1.2    Comment: (NOTE) INR goal varies based on device and disease states. Performed at [facility], [address] [place], [place] [address]   Lipase, blood     Status: None   Collection Time: 10/23/18  8:22 AM  Result Value Ref Range   Lipase 49 11 - 51 U/L    Comment: Performed at [facility], [address] [place], [place] [address]  POC occult blood, ED     Status: Abnormal   Collection Time: 10/23/18  8:23 AM  Result Value Ref Range   Fecal Occult Bld POSITIVE (A) NEGATIVE  Lactic acid, plasma     Status: Abnormal   Collection Time: 10/23/18  8:38 AM  Result Value Ref Range   Lactic Acid, Venous 2.7 (HH) 0.5 - 1.9 mmol/L    Comment: CRITICAL RESULT CALLED TO, READ BACK BY AND VERIFIED WITH: WHITE,M@0915  BY MATTHEWS, B 3.25.2020 Performed at [facility] [place], [place] [address]   Urinalysis, Routine w reflex microscopic     Status: Abnormal   Collection Time: 10/23/18 10:20 AM  Result Value Ref Range   Color, Urine YELLOW YELLOW   APPearance CLOUDY (A) CLEAR   Specific Gravity, Urine 1.020 1.005 - 1.030   pH 5.5 5.0 - 8.0   Glucose, UA NEGATIVE NEGATIVE mg/dL   Hgb urine dipstick MODERATE (A) NEGATIVE   Bilirubin Urine NEGATIVE NEGATIVE   Ketones, ur NEGATIVE NEGATIVE mg/dL   Protein, ur 30 (A) NEGATIVE mg/dL   Nitrite NEGATIVE NEGATIVE   Leukocytes,Ua LARGE (A) NEGATIVE    Comment: Performed at [facility], [address], [place], [place] [address]  Urinalysis, Microscopic (reflex)     Status: Abnormal   Collection Time: 10/23/18 10:20 AM  Result Value Ref Range   RBC / HPF 6-10 0 - 5 RBC/hpf   WBC, UA >50 0 - 5 WBC/hpf   Bacteria, UA MANY (A) NONE SEEN   Squamous Epithelial / LPF 0-5 0 - 5    Comment: Performed at [facility], [address], [place], [place] [address]  Lactic acid, plasma     Status: Abnormal   Collection Time: 10/23/18 11:05 AM  Result Value Ref  Range   Lactic Acid, Venous 2.1 (HH) 0.5 - 1.9 mmol/L    Comment: CRITICAL RESULT CALLED TO, READ BACK BY AND VERIFIED WITH: WHITE,M @1137  BY MATTHEWS, B 3.25.2020 Performed at [facility], [address], [place], [place] [address]     Ct Abdomen Pelvis Wo Contrast  Result Date: 10/23/2018 CLINICAL DATA:  Black tarry stools EXAM: CT ABDOMEN AND PELVIS WITHOUT CONTRAST TECHNIQUE: Multidetector CT imaging of the abdomen and pelvis was performed following the standard protocol without IV contrast. COMPARISON:  03/07/2018 FINDINGS: Lower chest: No acute abnormality. Hepatobiliary: Liver is well visualized and within normal limits. The gallbladder is well distended with multiple dependent densities consistent with small stones. This is stable from the previous exam. Pancreas: Scattered calcifications are noted within the pancreas stable from the previous exam. No mass lesion is noted. Spleen: Normal in size without focal  abnormality. Adrenals/Urinary Tract: Adrenal glands are within normal limits bilaterally. Renal cysts are seen bilaterally as well as multiple nonobstructing renal stones on the right. The largest of these measures 9 mm in greatest dimension. Bladder is decompressed. No ureteral stones are seen. Stomach/Bowel: Gaseous distension of the transverse colon is again noted. The remainder of the colon is within normal limits. The appendix is not well visualized Vascular/Lymphatic: Diffuse aortic calcifications are noted. 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 Mild aneurysmal dilatation of the abdominal aorta is again seen. Stable lymph nodes are noted in the periaortic region and right common iliac chain no new significant lymphadenopathy is noted. Reproductive: Brachytherapy seeds are noted throughout the prostate. Other: No abdominal wall hernia or abnormality. No abdominopelvic ascites. Musculoskeletal: Degenerative and postoperative changes of the thoracolumbar spine are seen.  Bilateral hip replacements are noted. IMPRESSION: Stable renal calculi particularly on the right with stable renal cystic change. Cholelithiasis without complicating factors. Infrarenal abdominal aorta stable from the previous exam. Stable small lymph nodes. No significant increase in lymphadenopathy is noted. Stable gaseous distension of the transverse colon. No definitive mass is seen. Electronically Signed   By: Inez Catalina M.D.   On: 10/23/2018 10:32   Dg Chest 2 View  Result Date: 10/23/2018 CLINICAL DATA:  Rectal pain and blood in stool. History of prostate cancer. EXAM: CHEST - 2 VIEW COMPARISON:  PA and lateral chest 05/09/2018. FINDINGS: Mild left basilar atelectasis or scar is seen. The right lung is clear. No pneumothorax or pleural effusion. Heart size is upper normal. The patient is status post CABG. Pacing device is in place. Thoracolumbar fusion hardware is noted. IMPRESSION: No acute disease. Electronically Signed   By: Inge Rise M.D.   On: 10/23/2018 09:52    ROS Blood pressure (!) 92/55, pulse (!) 54, temperature 97.9 F (36.6 C), temperature source Oral, resp. rate 20, height 5\' 7"  (1.702 m), weight 98.4 kg, SpO2 94 %. Physical Exam Alert and oriented. Skin warm and dry. Oral mucosa is moist.   . Sclera anicteric, conjunctivae is pink. Thyroid not enlarged. No cervical lymphadenopathy. Lungs clear. Heart regular rate and rhythm.  Abdomen is soft. Bowel sounds are positive. No hepatomegaly. No abdominal masses felt. No tenderness.  No edema to lower extremities.     Assessment/Plan: Probably UGI bleed. Will get records from Dr. Posey Nolan. Dr. Laural Golden is aware.  Perian Tedder L Ryliegh Mcduffey 10/23/2018, 12:03 PM

## 2018-10-23 NOTE — ED Notes (Signed)
CRITICAL VALUE ALERT  Critical Value:  Lactic Acid 2.1  Date & Time Notied:  10/23/18 1150  Provider Notified: Dr. Wynetta Emery  Orders Received/Actions taken: MD notified

## 2018-10-24 ENCOUNTER — Encounter (HOSPITAL_COMMUNITY): Payer: Self-pay

## 2018-10-24 ENCOUNTER — Encounter (HOSPITAL_COMMUNITY)
Admission: EM | Disposition: A | Discharge status: Home Health | Payer: Self-pay | Source: Home / Self Care | Attending: Internal Medicine

## 2018-10-24 DIAGNOSIS — K269 Duodenal ulcer, unspecified as acute or chronic, without hemorrhage or perforation: Secondary | ICD-10-CM

## 2018-10-24 DIAGNOSIS — D649 Anemia, unspecified: Secondary | ICD-10-CM

## 2018-10-24 DIAGNOSIS — K259 Gastric ulcer, unspecified as acute or chronic, without hemorrhage or perforation: Secondary | ICD-10-CM

## 2018-10-24 DIAGNOSIS — E875 Hyperkalemia: Secondary | ICD-10-CM

## 2018-10-24 DIAGNOSIS — E0842 Diabetes mellitus due to underlying condition with diabetic polyneuropathy: Secondary | ICD-10-CM

## 2018-10-24 DIAGNOSIS — K2980 Duodenitis without bleeding: Secondary | ICD-10-CM

## 2018-10-24 HISTORY — PX: ESOPHAGOGASTRODUODENOSCOPY: SHX5428

## 2018-10-24 LAB — CBC
HCT: 22.3 % — ABNORMAL LOW (ref 39.0–52.0)
HCT: 25.6 % — ABNORMAL LOW (ref 39.0–52.0)
Hemoglobin: 6.9 g/dL — CL (ref 13.0–17.0)
Hemoglobin: 8.1 g/dL — ABNORMAL LOW (ref 13.0–17.0)
MCH: 29.5 pg (ref 26.0–34.0)
MCH: 29.7 pg (ref 26.0–34.0)
MCHC: 30.9 g/dL (ref 30.0–36.0)
MCHC: 31.6 g/dL (ref 30.0–36.0)
MCV: 95.3 fL (ref 80.0–100.0)
MCV: 93.8 fL (ref 80.0–100.0)
NRBC: 0 % (ref 0.0–0.2)
PLATELETS: 167 10*3/uL (ref 150–400)
Platelets: 154 10*3/uL (ref 150–400)
RBC: 2.34 MIL/uL — AB (ref 4.22–5.81)
RBC: 2.73 MIL/uL — ABNORMAL LOW (ref 4.22–5.81)
RDW: 15.7 % — ABNORMAL HIGH (ref 11.5–15.5)
RDW: 16.9 % — ABNORMAL HIGH (ref 11.5–15.5)
WBC: 6.7 10*3/uL (ref 4.0–10.5)
WBC: 7.5 10*3/uL (ref 4.0–10.5)
nRBC: 0 % (ref 0.0–0.2)

## 2018-10-24 LAB — COMPREHENSIVE METABOLIC PANEL
ALT: 14 U/L (ref 0–44)
ANION GAP: 7 (ref 5–15)
AST: 20 U/L (ref 15–41)
Albumin: 2.6 g/dL — ABNORMAL LOW (ref 3.5–5.0)
Alkaline Phosphatase: 78 U/L (ref 38–126)
BUN: 106 mg/dL — ABNORMAL HIGH (ref 8–23)
CHLORIDE: 114 mmol/L — AB (ref 98–111)
CO2: 17 mmol/L — ABNORMAL LOW (ref 22–32)
Calcium: 7.8 mg/dL — ABNORMAL LOW (ref 8.9–10.3)
Creatinine, Ser: 2.6 mg/dL — ABNORMAL HIGH (ref 0.61–1.24)
GFR calc Af Amer: 26 mL/min — ABNORMAL LOW (ref >60)
GFR calc non Af Amer: 23 mL/min — ABNORMAL LOW (ref >60)
GLUCOSE: 99 mg/dL (ref 70–99)
Potassium: 5 mmol/L (ref 3.5–5.1)
Sodium: 138 mmol/L (ref 135–145)
Total Bilirubin: 0.4 mg/dL (ref 0.3–1.2)
Total Protein: 5.3 g/dL — ABNORMAL LOW (ref 6.5–8.1)

## 2018-10-24 LAB — GLUCOSE, CAPILLARY
Glucose-Capillary: 93 mg/dL (ref 70–99)
Glucose-Capillary: 91 mg/dL (ref 70–99)
Glucose-Capillary: 106 mg/dL — ABNORMAL HIGH (ref 70–99)
Glucose-Capillary: 135 mg/dL — ABNORMAL HIGH (ref 70–99)
Glucose-Capillary: 139 mg/dL — ABNORMAL HIGH (ref 70–99)

## 2018-10-24 LAB — MAGNESIUM: Magnesium: 2.4 mg/dL (ref 1.7–2.4)

## 2018-10-24 LAB — HEMOGLOBIN AND HEMATOCRIT, BLOOD
HEMATOCRIT: 25.1 % — AB (ref 39.0–52.0)
Hemoglobin: 7.8 g/dL — ABNORMAL LOW (ref 13.0–17.0)

## 2018-10-24 LAB — PREPARE RBC (CROSSMATCH)

## 2018-10-24 LAB — PHOSPHORUS: Phosphorus: 2.5 mg/dL (ref 2.5–4.6)

## 2018-10-24 SURGERY — EGD (ESOPHAGOGASTRODUODENOSCOPY)
Anesthesia: Moderate Sedation

## 2018-10-24 MED ORDER — FENTANYL CITRATE (PF) 100 MCG/2ML IJ SOLN
INTRAMUSCULAR | Status: DC | PRN
  Administered 2018-10-24: 25 ug via INTRAVENOUS

## 2018-10-24 MED ORDER — SUCRALFATE 1 GM/10ML PO SUSP
1.0000 g | Freq: Three times a day (TID) | ORAL | Status: DC
  Administered 2018-10-24 – 2018-10-27 (×12): 1 g via ORAL
  Filled 2018-10-24 (×12): qty 10

## 2018-10-24 MED ORDER — FENTANYL CITRATE (PF) 100 MCG/2ML IJ SOLN
INTRAMUSCULAR | Status: AC
  Filled 2018-10-24: qty 2

## 2018-10-24 MED ORDER — SODIUM CHLORIDE 0.9% IV SOLUTION
Freq: Once | INTRAVENOUS | Status: AC
  Administered 2018-10-24: 06:00:00 via INTRAVENOUS

## 2018-10-24 MED ORDER — LIDOCAINE VISCOUS HCL 2 % MT SOLN
OROMUCOSAL | Status: DC | PRN
  Administered 2018-10-24: 4 mL via OROMUCOSAL

## 2018-10-24 MED ORDER — MIDAZOLAM HCL 5 MG/5ML IJ SOLN
INTRAMUSCULAR | Status: DC | PRN
  Administered 2018-10-24 (×2): 2 mg via INTRAVENOUS

## 2018-10-24 MED ORDER — MEPERIDINE HCL 50 MG/ML IJ SOLN
INTRAMUSCULAR | Status: AC
  Filled 2018-10-24: qty 1

## 2018-10-24 MED ORDER — MIDAZOLAM HCL 5 MG/5ML IJ SOLN
INTRAMUSCULAR | Status: AC
  Filled 2018-10-24: qty 10

## 2018-10-24 MED ORDER — LIDOCAINE VISCOUS HCL 2 % MT SOLN
OROMUCOSAL | Status: AC
  Filled 2018-10-24: qty 15

## 2018-10-24 MED ORDER — STERILE WATER FOR IRRIGATION IR SOLN
Status: DC | PRN
  Administered 2018-10-24: 100 mL

## 2018-10-24 NOTE — Progress Notes (Signed)
Patient ID: [name], male   DOB: 1940/10/08, 78 y.o.   MRN: [id number] Alert. No complaints. Has had 3 tarry BMs since admission. Has had drop in hemoglobin to 6.9. One unit of PRBCs hangning. Will undergo an EGD today for suspected UGIB.

## 2018-10-24 NOTE — Op Note (Signed)
[facility] Patient Name: [name] Procedure Date: 10/24/2018 1:21 PM MRN: [id number] Date of Birth: 17-Jul-1941 Attending MD: Hildred Laser , MD CSN: [id number] Age: 78 Admit Type: Inpatient Procedure:                Upper GI endoscopy Indications:              [place] Providers:                Hildred Laser, MD, Rosina Lowenstein, RN, Randa Spike, Technician, Raphael Gibney, Technician Referring MD:             Irwin Brakeman, MD Medicines:                Lidocaine spray, Fentanyl 25 micrograms IV,                            Midazolam 11 mg IV Complications:            No immediate complications. Estimated Blood Loss:     Estimated blood loss: none. Procedure:                Pre-Anesthesia Assessment:                           - Prior to the procedure, a History and Physical                            was performed, and patient medications and                            allergies were reviewed. The patient's tolerance of                            previous anesthesia was also reviewed. The risks                            and benefits of the procedure and the sedation                            options and risks were discussed with the patient.                            All questions were answered, and informed consent                            was obtained. Prior Anticoagulants: The patient has                            taken no previous anticoagulant or antiplatelet                            agents except for aspirin and has taken no previous  anticoagulant or antiplatelet agents except for                            NSAID medication. ASA Grade Assessment: III - A                            patient with severe systemic disease. After                            reviewing the risks and benefits, the patient was                            deemed in satisfactory condition to undergo the   procedure.                           After obtaining informed consent, the endoscope was                            passed under direct vision. Throughout the                            procedure, the patient's blood pressure, pulse, and                            oxygen saturations were monitored continuously. The                            GIF-H190 ([id number]) scope was introduced through the                            mouth, and advanced to the duodenal bulb. The upper                            GI endoscopy was accomplished without difficulty.                            The patient tolerated the procedure well. Scope In: 1:54:18 PM Scope Out: 2:00:08 PM Total Procedure Duration: 0 hours 5 minutes 50 seconds  Findings:      The examined esophagus was normal.      The Z-line was regular and was found 40 cm from the incisors.      One non-bleeding cratered gastric ulcer with pigmented material was       found at the incisura. The lesion was ten mm by twenty mm in largest       dimension.      The exam of the stomach was otherwise normal.      Two non-bleeding cratered duodenal ulcers with pigmented material were       found in the duodenal bulb. The largest lesion was 15 mm in largest       dimension.      Diffuse moderate inflammation was found in the duodenal bulb.      An examination of the 2nd part of duodenum was not performed. Impression:               -  Normal esophagus.                           - Z-line regular, 40 cm from the incisors.                           - Non-bleeding gastric ulcer with pigmented                            material.                           - Non-bleeding deep duodenal ulcers with pigmented                            material.                           - Duodenitis.                           - No specimens collected. Moderate Sedation:      Moderate (conscious) sedation was administered by the endoscopy nurse       and supervised by the  endoscopist. The following parameters were       monitored: oxygen saturation, heart rate, blood pressure, CO2       capnography and response to care. Total physician intraservice time was       11 minutes. Recommendation:           - Return patient to ICU for ongoing care.                           - Clear liquid diet today.                           - Continue present medications.                           - H.Pylori serology.                           - H/H at 6 pm.                           - Sucralfate 1 g po qid.                           - No aspirin, ibuprofen, naproxen, or other                            non-steroidal anti-inflammatory drugs.                           - Repeat upper endoscopy in 3 months to check                            healing. Procedure Code(s):        --- Professional ---  02725, Esophagogastroduodenoscopy, flexible,                            transoral; diagnostic, including collection of                            specimen(s) by brushing or washing, when performed                            (separate procedure)                           G0500, Moderate sedation services provided by the                            same physician or other qualified health care                            professional performing a gastrointestinal                            endoscopic service that sedation supports,                            requiring the presence of an independent trained                            observer to assist in the monitoring of the                            patient's level of consciousness and physiological                            status; initial 15 minutes of intra-service time;                            patient age 71 years or older (additional time may                            be reported with [phone], as appropriate) Diagnosis Code(s):        --- Professional ---                           K25.9, Gastric ulcer,  unspecified as acute or                            chronic, without hemorrhage or perforation                           K26.9, Duodenal ulcer, unspecified as acute or                            chronic, without hemorrhage or perforation  K29.80, Duodenitis without bleeding                           K92.1, Melena (includes Hematochezia) CPT copyright 2019 [employer] Association. All rights reserved. The codes documented in this report are preliminary and upon coder review may  be revised to meet current compliance requirements. Hildred Laser, MD Hildred Laser, MD 10/24/2018 2:15:35 PM This report has been signed electronically. Number of Addenda: 0

## 2018-10-24 NOTE — Progress Notes (Signed)
Brief EGD note.  Normal mucosa of esophagus and GE junction.  10 x 20 mm ulcer at gastric antrum with black eschar but no bleeding.  2 large deep bulbar ulcers with black eschar but no active bleeding. Unable to examine second part of the duodenum because of edema to bulbar mucosa.

## 2018-10-24 NOTE — Progress Notes (Signed)
Lab called critical Hgb of 6.9 at 0538. Dr. Olevia Bowens notified via text page at [phone].

## 2018-10-24 NOTE — Progress Notes (Signed)
PROGRESS NOTE    [name]  NLG:[id number] DOB: 18-Jan-1941 DOA: 10/23/2018 PCP: Monico Blitz, MD    Brief Narrative:  78 year old male with history of chronic kidney disease stage III, diabetes, prostate cancer, recent urinary tract infection was taking Bactrim, presents with dark stools, anemia and acute on chronic kidney disease.  He was found to have GI bleeding and underwent transfusion of PRBC.  Seen by GI with plans to undergo EGD.  Overall renal function improving with hydration.  He was taking NSAIDs prior to admission.   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 Upper GI bleeding.  Patient has been taking NSAIDs as an outpatient.  Last had some dark stools yesterday.  GI following and plans for EGD today.  Continue on Protonix. 2. Acute blood loss anemia.  Transfuse for hemoglobin less than 7.  He received 1 unit of PRBC today.  Continue to follow. 3. Acute kidney injury on chronic kidney disease stage III.  Likely related to volume depletion and severe anemia.  NSAID use likely contributing.  He was also taking Bactrim prior to admission.  Improving with IV fluids. 4. Hyperkalemia.  Improved after receiving Lokelma. 5. Urinary tract infection.  Patient reported significant dysuria.  He had taken  Bactrim prior to admission.  Currently on ceftriaxone.  Will request urine culture.  Possible history of incomplete bladder emptying.  Currently has Foley catheter.  Will check postvoid residual once catheter has been discontinued 6. Sepsis.  Hemodynamics are is currently stable.  Lactic acid has improved with IV hydration.  Follow-up cultures.  Continue antibiotics. 7. Diabetes.  Holding oral medications.  Continue on sliding scale insulin.   8. History of prostate cancer.  Continue follow-up with urology.   DVT prophylaxis: SCDs Code Status: Full code Family Communication: No family present Disposition Plan: Discharge home once GI work-up is complete   Consultants:   Gastroenterology  Procedures:     Antimicrobials:   Ceftriaxone 3/25 >   Subjective: Patient reports having some dark stool yesterday evening, none since then.  Objective: Vitals:   10/24/18 1405 10/24/18 1410 10/24/18 1500 10/24/18 1600  BP: (!) 93/46  (!) 114/54 (!) 111/58  Pulse: 73 70 66 78  Resp: 18 (!) 23 15 18   Temp:      TempSrc:      SpO2: 99% 99% 96% 99%  Weight:      Height:        Intake/Output Summary (Last 24 hours) at 10/24/2018 1716 Last data filed at 10/24/2018 1600 Gross per 24 hour  Intake 1619.52 ml  Output 2700 ml  Net -1080.48 ml   Filed Weights   10/23/18 0802 10/23/18 1230 10/24/18 0500  Weight: 98.4 kg 94.5 kg 98.5 kg  Examination:  General exam: Appears calm and comfortable  Respiratory system: Clear to auscultation. Respiratory effort normal. Cardiovascular system: S1 & S2 heard, RRR. No JVD, murmurs, rubs, gallops or clicks. No pedal edema. Gastrointestinal system: Abdomen is nondistended, soft and nontender. No organomegaly or masses felt. Normal bowel sounds heard. Central nervous system: Alert and oriented. No focal neurological deficits. Extremities: Symmetric 5 x 5 power. Skin: No rashes, lesions or ulcers Psychiatry: Judgement and insight appear normal.  Mood & affect appropriate.     Data Reviewed: I have personally reviewed following labs and imaging studies  CBC: Recent Labs  Lab 10/23/18 0822 10/23/18 1445 10/23/18 2028 10/24/18 0508 10/24/18 1200  WBC 8.7  --   --  6.7 7.5  NEUTROABS 7.3  --   --   --   --   HGB 8.4* 8.0* 7.5* 6.9* 8.1*  HCT 26.3* 25.7* 23.3* 22.3* 25.6*  MCV 93.6  --   --  95.3 93.8  PLT 183  --   --  154 644   Basic Metabolic Panel: Recent Labs  Lab 10/23/18 0821 10/24/18 0508  NA 135 138  K 5.8* 5.0  CL 108 114*  CO2 19* 17*  GLUCOSE 94 99  BUN 126* 106*  CREATININE 3.89* 2.60*  CALCIUM 8.3* 7.8*  MG  --  2.4  PHOS  --  2.5   GFR: Estimated Creatinine Clearance (by C-G formula based on SCr of 2.6 mg/dL (H)) Male: 21.9 mL/min (A) Male: 26.6 mL/min (A) Liver Function Tests: Recent Labs  Lab 10/23/18 0821 10/24/18 0508  AST 24 20  ALT 15 14  ALKPHOS 92 78  BILITOT 0.4 0.4  PROT 6.2* 5.3*  ALBUMIN 3.0* 2.6*   Recent Labs  Lab 10/23/18 0822  LIPASE 49   No results for input(s): AMMONIA in the last 168 hours. Coagulation Profile: Recent Labs  Lab 10/23/18 0822  INR 1.1   Cardiac Enzymes: Recent Labs  Lab 10/23/18 0822  TROPONINI <0.03   BNP (last 3 results) No results for input(s): PROBNP in the last 8760 hours. HbA1C: Recent Labs    10/23/18 0815  HGBA1C 6.0*   CBG: Recent Labs  Lab 10/23/18 1746 10/23/18 2029 10/24/18 0218 10/24/18 0835 10/24/18 1140  GLUCAP 67* 157* 93 91 106*   Lipid Profile: No results for input(s): CHOL, HDL, LDLCALC, TRIG, CHOLHDL, LDLDIRECT in the last 72 hours. Thyroid Function Tests: No results for input(s): TSH, T4TOTAL, FREET4, T3FREE, THYROIDAB in the last 72 hours. Anemia Panel: Recent Labs    10/23/18 0815 10/23/18 1445  VITAMINB12 258  --   FOLATE  --  13.7  FERRITIN 149  --   TIBC 263  --   IRON 61  --   RETICCTPCT 1.5  --    Sepsis Labs: Recent Labs  Lab 10/23/18 0838 10/23/18 1105 10/23/18 1653   LATICACIDVEN 2.7* 2.1* 1.2    Recent Results (from the past 240 hour(s))  MRSA PCR Screening     Status: None   Collection Time: 10/23/18  4:00 PM  Result Value Ref Range Status   MRSA by PCR NEGATIVE NEGATIVE Final    Comment:        The GeneXpert MRSA Assay (FDA approved for NASAL specimens only), is one component of a comprehensive MRSA colonization surveillance program. It is not intended to diagnose MRSA infection nor to guide or monitor treatment for MRSA infections. Performed at [facility], [address] [place], [place] [address]  Radiology Studies: Ct Abdomen Pelvis Wo Contrast  Result Date: 10/23/2018 CLINICAL DATA:  Black tarry stools EXAM: CT ABDOMEN AND PELVIS WITHOUT CONTRAST TECHNIQUE: Multidetector CT imaging of the abdomen and pelvis was performed following the standard protocol without IV contrast. COMPARISON:  03/07/2018 FINDINGS: Lower chest: No acute abnormality. Hepatobiliary: Liver is well visualized and within normal limits. The gallbladder is well distended with multiple dependent densities consistent with small stones. This is stable from the previous exam. Pancreas: Scattered calcifications are noted within the pancreas stable from the previous exam. No mass lesion is noted. Spleen: Normal in size without focal abnormality. Adrenals/Urinary Tract: Adrenal glands are within normal limits bilaterally. Renal cysts are seen bilaterally as well as multiple nonobstructing renal stones on the right. The largest of these measures 9 mm in greatest dimension. Bladder is decompressed. No ureteral stones are seen. Stomach/Bowel: Gaseous distension of the transverse colon is again noted. The remainder of the colon is within normal limits. The appendix is not well visualized Vascular/Lymphatic: Diffuse aortic calcifications are noted. 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 Mild aneurysmal dilatation of the abdominal aorta is again seen. Stable  lymph nodes are noted in the periaortic region and right common iliac chain no new significant lymphadenopathy is noted. Reproductive: Brachytherapy seeds are noted throughout the prostate. Other: No abdominal wall hernia or abnormality. No abdominopelvic ascites. Musculoskeletal: Degenerative and postoperative changes of the thoracolumbar spine are seen. Bilateral hip replacements are noted. IMPRESSION: Stable renal calculi particularly on the right with stable renal cystic change. Cholelithiasis without complicating factors. Infrarenal abdominal aorta stable from the previous exam. Stable small lymph nodes. No significant increase in lymphadenopathy is noted. Stable gaseous distension of the transverse colon. No definitive mass is seen. Electronically Signed   By: Inez Catalina M.D.   On: 10/23/2018 10:32   Dg Chest 2 View  Result Date: 10/23/2018 CLINICAL DATA:  Rectal pain and blood in stool. History of prostate cancer. EXAM: CHEST - 2 VIEW COMPARISON:  PA and lateral chest 05/09/2018. FINDINGS: Mild left basilar atelectasis or scar is seen. The right lung is clear. No pneumothorax or pleural effusion. Heart size is upper normal. The patient is status post CABG. Pacing device is in place. Thoracolumbar fusion hardware is noted. IMPRESSION: No acute disease. Electronically Signed   By: Inge Rise M.D.   On: 10/23/2018 09:52        Scheduled Meds: . fentaNYL      . sodium chloride   Intravenous Once  . atorvastatin  40 mg Oral q1800  . feeding supplement  1 Container Oral TID BM  . feeding supplement (PRO-STAT SUGAR FREE 64)  30 mL Oral BID  . insulin aspart  0-9 Units Subcutaneous TID WC  . lidocaine      . midazolam      . sucralfate  1 g Oral TID WC & HS  . tamsulosin  0.8 mg Oral QPC supper   Continuous Infusions: . sodium chloride 100 mL/hr at 10/24/18 0412  . cefTRIAXone (ROCEPHIN)  IV 1 g (10/24/18 1138)  . pantoprozole (PROTONIX) infusion 8 mg/hr (10/24/18 0411)     LOS: 1  day    Time spent: 8mins    Kathie Dike, MD [facility]   If 7PM-7AM, please contact night-coverage [email or web address]  10/24/2018, 5:16 PM

## 2018-10-25 ENCOUNTER — Inpatient Hospital Stay (HOSPITAL_COMMUNITY): Payer: Medicare Other

## 2018-10-25 LAB — CBC
HCT: 25.9 % — ABNORMAL LOW (ref 39.0–52.0)
HEMOGLOBIN: 8.3 g/dL — AB (ref 13.0–17.0)
MCH: 30.6 pg (ref 26.0–34.0)
MCHC: 32 g/dL (ref 30.0–36.0)
MCV: 95.6 fL (ref 80.0–100.0)
Platelets: 177 10*3/uL (ref 150–400)
RBC: 2.71 MIL/uL — ABNORMAL LOW (ref 4.22–5.81)
RDW: 16.5 % — ABNORMAL HIGH (ref 11.5–15.5)
WBC: 6.7 10*3/uL (ref 4.0–10.5)
nRBC: 0 % (ref 0.0–0.2)

## 2018-10-25 LAB — GLUCOSE, CAPILLARY
GLUCOSE-CAPILLARY: 127 mg/dL — AB (ref 70–99)
Glucose-Capillary: 110 mg/dL — ABNORMAL HIGH (ref 70–99)
Glucose-Capillary: 264 mg/dL — ABNORMAL HIGH (ref 70–99)
Glucose-Capillary: 111 mg/dL — ABNORMAL HIGH (ref 70–99)
Glucose-Capillary: 116 mg/dL — ABNORMAL HIGH (ref 70–99)

## 2018-10-25 LAB — BASIC METABOLIC PANEL
Anion gap: 6 (ref 5–15)
BUN: 66 mg/dL — ABNORMAL HIGH (ref 8–23)
CO2: 18 mmol/L — ABNORMAL LOW (ref 22–32)
Calcium: 8.1 mg/dL — ABNORMAL LOW (ref 8.9–10.3)
Chloride: 116 mmol/L — ABNORMAL HIGH (ref 98–111)
Creatinine, Ser: 1.82 mg/dL — ABNORMAL HIGH (ref 0.61–1.24)
GFR calc non Af Amer: 35 mL/min — ABNORMAL LOW (ref >60)
GFR, EST AFRICAN AMERICAN: 41 mL/min — AB (ref >60)
Glucose, Bld: 118 mg/dL — ABNORMAL HIGH (ref 70–99)
Potassium: 4.2 mmol/L (ref 3.5–5.1)
Sodium: 140 mmol/L (ref 135–145)

## 2018-10-25 LAB — TYPE AND SCREEN
ABO/RH(D): O POS
ANTIBODY SCREEN: NEGATIVE
UNIT DIVISION: 0

## 2018-10-25 LAB — BPAM RBC
Blood Product Expiration Date: 202004102359
ISSUE DATE / TIME: 202003260631
Unit Type and Rh: 5100

## 2018-10-25 LAB — URINE CULTURE: Culture: NO GROWTH

## 2018-10-25 MED ORDER — MUSCLE RUB 10-15 % EX CREA
TOPICAL_CREAM | CUTANEOUS | Status: DC | PRN
  Filled 2018-10-25: qty 85

## 2018-10-25 NOTE — Progress Notes (Signed)
  Subjective:  Patient has no complaints.  He denies abdominal pain nausea or vomiting.  He had bowel movement this morning.  He says small amount and occult blood but no bright red blood noted.  Objective: Blood pressure (!) 99/54, pulse 74, temperature (!) 97.4 F (36.3 C), temperature source Oral, resp. rate 17, height _0  (1.702 m), weight 96.4 kg, SpO2 97 %. Patient is alert and in no acute distress. He appears pale. Abdomen is protuberant but soft and nontender with organomegaly or masses.  Labs/studies Results:  CBC Latest Ref Rng & Units 10/25/2018 10/24/2018 10/24/2018  WBC 4.0 - 10.5 K/uL 6.7 - 7.5  Hemoglobin 13.0 - 17.0 g/dL 8.3(L) 7.8(L) 8.1(L)  Hematocrit 39.0 - 52.0 % 25.9(L) 25.1(L) 25.6(L)  Platelets 150 - 400 K/uL 177 - 167    CMP Latest Ref Rng & Units 10/25/2018 10/24/2018 10/23/2018  Glucose 70 - 99 mg/dL 118(H) 99 94  BUN 8 - 23 mg/dL 66(H) 106(H) 126(H)  Creatinine 0.61 - 1.24 mg/dL 1.82(H) 2.60(H) 3.89(H)  Sodium 135 - 145 mmol/L 140 138 135  Potassium 3.5 - 5.1 mmol/L 4.2 5.0 5.8(H)  Chloride 98 - 111 mmol/L 116(H) 114(H) 108  CO2 22 - 32 mmol/L 18(L) 17(L) 19(L)  Calcium 8.9 - 10.3 mg/dL 8.1(L) 7.8(L) 8.3(L)  Total Protein 6.5 - 8.1 g/dL - 5.3(L) 6.2(L)  Total Bilirubin 0.3 - 1.2 mg/dL - 0.4 0.4  Alkaline Phos 38 - 126 U/L - 78 92  AST 15 - 41 U/L - 20 24  ALT 0 - 44 U/L - 14 15    Hepatic Function Latest Ref Rng & Units 10/24/2018 10/23/2018 06/10/2018  Total Protein 6.5 - 8.1 g/dL 5.3(L) 6.2(L) 7.4  Albumin 3.5 - 5.0 g/dL 2.6(L) 3.0(L) 4.3  AST 15 - 41 U/L 20 24 33  ALT 0 - 44 U/L _1 Alk Phosphatase 38 - 126 U/L 78 92 98  Total Bilirubin 0.3 - 1.2 mg/dL 0.4 0.4 1.3(H)     H. Pylori serology negative  Assessment:  #1.  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  He had 1 gastric and 2 bulbar ulcers.  He is likely bled from bulbar ulcer but he had some black pigment at gastric ulcer.  He had rather pronounced bulbar edema and I was not  able to advance the scope into second part of duodenum.  Hemoglobin is low but stable.  He has received 1 unit of PRBCs.  #2.  Chronic kidney disease.  Renal function has improved significantly this admission.  Acute injury secondary to hypotension.  #3.  Urinary tract infection.  Patient on ceftriaxone.   Recommendations:  Diet advanced to full liquids. Continue pantoprazole infusion for another 24 hours. Patient informed that he would not be able to take NSAIDs anymore but can go back on low-dose aspirin after 2 weeks.

## 2018-10-25 NOTE — Progress Notes (Signed)
Bladder scanned patient immediately after voiding and he had 33mls on scanner.

## 2018-10-25 NOTE — Progress Notes (Addendum)
PROGRESS NOTE    [name]  YQM:[id number] DOB: Jan 16, 1941 DOA: 10/23/2018 PCP: Monico Blitz, MD    Brief Narrative:  78 year old male with history of chronic kidney disease stage III, diabetes, prostate cancer, recent urinary tract infection was taking Bactrim, presents with dark stools, anemia and acute on chronic kidney disease.  He was found to have GI bleeding and underwent transfusion of PRBC.  Seen by GI with plans to undergo EGD.  Overall renal function improving with hydration.  He was taking NSAIDs prior to admission.   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 Upper GI bleeding.  Status post EGD and felt to be related to peptic ulcer disease.  Protonix infusion and will be so for the next 24 hours.  Hemoglobin has been stable.  He has been advised to abstain from any NSAIDs in the future.  Diet has been advanced to full liquids. 2. Acute blood loss anemia.  Transfuse for hemoglobin less than 7.  He received 1 unit of PRBC during his hospital stay.  Follow-up hemoglobin has been stable. 3. Acute kidney injury on chronic kidney disease stage III.  Likely related to volume depletion and severe anemia.  NSAID use likely contributing.  He was also taking Bactrim prior to admission.  Improving with IV  fluids. 4. Hyperkalemia.  Improved after receiving Lokelma. 5. Urinary tract infection.  Urine culture has shown no growth.  Discontinue further ceftriaxone.  Possible history of incomplete bladder emptying.  Foley catheter has been discontinued today.  He is passing urine.  Check postvoid residual 6. Sepsis.  Hemodynamics are currently stable.  Lactic acid has improved with IV hydration.  No fevers.  Antibiotics have been discontinued 7. Diabetes.  Holding oral medications.  Continue on sliding scale insulin.  Blood sugars have been stable 8. History of prostate cancer.  Continue follow-up with urology. 9. Pressure injury, stage II on right buttocks, present on admission, continue supportive care   DVT prophylaxis: SCDs Code Status: Full code Family Communication: No family present Disposition Plan: Discharge home once GI work-up is complete   Consultants:   Gastroenterology  Procedures:  EGD:- Normal esophagus.                           - Z-line regular, 40 cm from the incisors.                           - Non-bleeding gastric ulcer with pigmented                            material.                           -  Non-bleeding deep duodenal ulcers with pigmented                            material.                           - Duodenitis.                            - No specimens collected.  Antimicrobials:   Ceftriaxone 3/25 > 3/27   Subjective: Complains of some pain in his left foot.  Describes a dark tarry stool this morning.  No nausea or vomiting.  Objective: Vitals:   10/25/18 1100 10/25/18 1120 10/25/18 1200 10/25/18 1451  BP: (!) 104/58  94/68 (!) 96/57  Pulse: 78 80 83 78  Resp: (!) 23 (!) 22 20 20   Temp:  98.4 F (36.9 C)  98.7 F (37.1 C)  TempSrc:  Oral    SpO2: 95% 100% 100% 100%  Weight:      Height:        Intake/Output Summary (Last 24 hours) at 10/25/2018 1815 Last data filed at 10/25/2018 1207 Gross per 24 hour  Intake 1224.78 ml  Output 3050 ml   Net -1825.22 ml   Filed Weights   10/23/18 1230 10/24/18 0500 10/25/18 0342  Weight: 94.5 kg 98.5 kg 96.4 kg    Examination:  General exam: Appears calm and comfortable  Respiratory system: Clear to auscultation. Respiratory effort normal. Cardiovascular system: S1 & S2 heard, RRR. No JVD, murmurs, rubs, gallops or clicks. No pedal edema. Gastrointestinal system: Abdomen is nondistended, soft and nontender. No organomegaly or masses felt. Normal bowel sounds heard. Central nervous system: Alert and oriented. No focal neurological deficits. Extremities: Symmetric 5 x 5 power.  Pain on inversion the right foot Skin: No rashes, lesions or ulcers Psychiatry: Judgement and insight appear normal. Mood & affect appropriate.     Data Reviewed: I have personally reviewed following labs and imaging studies  CBC: Recent Labs  Lab 10/23/18 0822  10/23/18 2028 10/24/18 0508 10/24/18 1200 10/24/18 1828 10/25/18 0405  WBC 8.7  --   --  6.7 7.5  --  6.7  NEUTROABS 7.3  --   --   --   --   --   --   HGB 8.4*   < > 7.5* 6.9* 8.1* 7.8* 8.3*  HCT 26.3*   < > 23.3* 22.3* 25.6* 25.1* 25.9*  MCV 93.6  --   --  95.3 93.8  --  95.6  PLT 183  --   --  154 167  --  177   < > = values in this interval not displayed.   Basic Metabolic Panel: Recent Labs  Lab 10/23/18 0821 10/24/18 0508 10/25/18 0405  NA 135 138 140  K 5.8* 5.0 4.2  CL 108 114* 116*  CO2 19* 17* 18*  GLUCOSE 94 99 118*  BUN 126* 106* 66*  CREATININE 3.89* 2.60* 1.82*  CALCIUM 8.3* 7.8* 8.1*  MG  --  2.4  --   PHOS  --  2.5  --    GFR: Estimated Creatinine Clearance (by C-G formula based on SCr of 1.82 mg/dL (H)) Male: 30.9 mL/min (A) Male: 37.6 mL/min (A) Liver Function Tests: Recent Labs  Lab 10/23/18 0821 10/24/18 0508  AST 24 20  ALT 15 14  ALKPHOS 92 78  BILITOT  0.4 0.4  PROT 6.2* 5.3*  ALBUMIN 3.0* 2.6*   Recent Labs  Lab 10/23/18 0822  LIPASE 49   No results for input(s): AMMONIA in the last  168 hours. Coagulation Profile: Recent Labs  Lab 10/23/18 0822  INR 1.1   Cardiac Enzymes: Recent Labs  Lab 10/23/18 0822  TROPONINI <0.03   BNP (last 3 results) No results for input(s): PROBNP in the last 8760 hours. HbA1C: Recent Labs    10/23/18 0815  HGBA1C 6.0*   CBG: Recent Labs  Lab 10/24/18 2145 10/25/18 0341 10/25/18 0727 10/25/18 1119 10/25/18 1657  GLUCAP 139* 110* 127* 264* 111*   Lipid Profile: No results for input(s): CHOL, HDL, LDLCALC, TRIG, CHOLHDL, LDLDIRECT in the last 72 hours. Thyroid Function Tests: No results for input(s): TSH, T4TOTAL, FREET4, T3FREE, THYROIDAB in the last 72 hours. Anemia Panel: Recent Labs    10/23/18 0815 10/23/18 1445  VITAMINB12 258  --   FOLATE  --  13.7  FERRITIN 149  --   TIBC 263  --   IRON 61  --   RETICCTPCT 1.5  --    Sepsis Labs: Recent Labs  Lab 10/23/18 0838 10/23/18 1105 10/23/18 1653  LATICACIDVEN 2.7* 2.1* 1.2    Recent Results (from the past 240 hour(s))  MRSA PCR Screening     Status: None   Collection Time: 10/23/18  4:00 PM  Result Value Ref Range Status   MRSA by PCR NEGATIVE NEGATIVE Final    Comment:        The GeneXpert MRSA Assay (FDA approved for NASAL specimens only), is one component of a comprehensive MRSA colonization surveillance program. It is not intended to diagnose MRSA infection nor to guide or monitor treatment for MRSA infections. Performed at [facility], [address], [place], [place] [address]   Culture, Urine     Status: None   Collection Time: 10/24/18 11:45 AM  Result Value Ref Range Status   Specimen Description   Final    URINE, CATHETERIZED Performed at [facility], [address], [place], [place] [address]    Special Requests   Final    NONE Performed at [facility], [address], [place], [place] [address]    Culture   Final    NO GROWTH Performed at [facility], [place] [address], [place], [place] [address]    Report Status  10/25/2018 FINAL  Final         Radiology Studies: Dg Foot 2 Views Right  Result Date: 10/25/2018 CLINICAL DATA:  Right foot pain after fall a few days ago. EXAM: RIGHT FOOT - 2 VIEW COMPARISON:  None. FINDINGS: There is no evidence of fracture or dislocation. Severe osteoarthritis is seen involving the first metatarsophalangeal joint. Minimal posterior calcaneal spurring is noted. Vascular calcifications are noted. IMPRESSION: Severe osteoarthritis of first metatarsophalangeal joint. No acute abnormality seen in the right foot. Electronically Signed   By: Marijo Conception, M.D.   On: 10/25/2018 14:36        Scheduled Meds: . atorvastatin  40 mg Oral q1800  . feeding supplement  1 Container Oral TID BM  . feeding supplement (PRO-STAT SUGAR FREE 64)  30 mL Oral BID  . insulin aspart  0-9 Units Subcutaneous TID WC  . sucralfate  1 g Oral TID WC & HS  . tamsulosin  0.8 mg Oral QPC supper   Continuous Infusions: . cefTRIAXone (ROCEPHIN)  IV 1 g (10/25/18 1202)  . pantoprozole (PROTONIX)  infusion 8 mg/hr (10/25/18 1552)     LOS: 2 days    Time spent: 73mins    Kathie Dike, MD [facility]   If 7PM-7AM, please contact night-coverage [email or web address]  10/25/2018, 6:15 PM

## 2018-10-25 NOTE — Plan of Care (Signed)
  Problem: Acute Rehab PT Goals(only PT should resolve) Goal: Pt Will Go Supine/Side To Sit Outcome: Progressing Flowsheets (Taken 10/25/2018 1046) Pt will go Supine/Side to Sit: with modified independence Goal: Pt Will Go Sit To Supine/Side Outcome: Progressing Flowsheets (Taken 10/25/2018 1046) Pt will go Sit to Supine/Side: with modified independence Goal: Patient Will Perform Sitting Balance Outcome: Progressing Flowsheets (Taken 10/25/2018 1046) Patient will perform sitting balance: Independently Goal: Patient Will Transfer Sit To/From Stand Outcome: Progressing Flowsheets (Taken 10/25/2018 1046) Patient will transfer sit to/from stand: with modified independence Goal: Pt Will Transfer Bed To Chair/Chair To Bed Outcome: Progressing Flowsheets (Taken 10/25/2018 1046) Pt will Transfer Bed to Chair/Chair to Bed: with modified independence Goal: Pt Will Ambulate Outcome: Progressing Flowsheets (Taken 10/25/2018 1046) Pt will Ambulate: 50 feet; with least restrictive assistive device Goal: Pt Will Go Up/Down Stairs Outcome: Progressing Flowsheets (Taken 10/25/2018 1046) Pt will Go Up / Down Stairs: 3-5 stairs; with modified independence   Clarene Critchley PT, DPT 10:48 AM, 10/25/18 [phone]

## 2018-10-25 NOTE — TOC Initial Note (Signed)
Transition of Care [facility]) - Initial/Assessment Note    Patient Details  Name: [name] MRN: [id number] Date of Birth: 1941/07/20  Transition of Care [facility]) CM/SW Contact:    Shade Flood, LCSW Phone Number: 10/25/2018, 11:59 AM  Clinical Narrative:                 PT recommending SNF rehab for pt. Met with pt today to discuss. Per pt, he does not want to go to SNF. He would like HH PT at dc. CMS list provided to pt who requests referral to [facility]. Pt states he has his wife and several family members who can help him at home. Spoke with PT regarding pt's wishes. PT will follow up with pt over the weekend to keep working with him in preparation for going home. If pt changes his mind and wants SNF referral, LCSW will follow up Monday. HH referral given to [facility].  Expected Discharge Plan: [place] Barriers to Discharge: Continued Medical Work up   Patient Goals and CMS Choice Patient states their goals for this hospitalization and ongoing recovery are:: return home CMS Medicare.gov Compare Post Acute Care list provided to:: Patient Choice offered to / list presented to : Patient  Expected Discharge Plan and Services Expected Discharge Plan: [place] Arranged: PT [facility] Agency: [place] ([facility])  Prior Living Arrangements/Services   Lives with:: Spouse Patient language and need for interpreter reviewed:: Yes Do you feel safe going back to the place where you live?: Yes      Need for Family Participation in Patient Care: Yes (Comment) Care giver support system in place?: Yes (comment)   Criminal Activity/Legal Involvement Pertinent to Current Situation/Hospitalization: No - Comment as needed  Activities of Daily Living Home Assistive Devices/Equipment: Cane (specify quad or straight) ADL Screening (condition at time of admission) Patient's cognitive ability  adequate to safely complete daily activities?: Yes Is the patient deaf or have difficulty hearing?: No Does the patient have difficulty seeing, even when wearing glasses/contacts?: No Does the patient have difficulty concentrating, remembering, or making decisions?: No Patient able to express need for assistance with ADLs?: Yes Does the patient have difficulty dressing or bathing?: No Independently performs ADLs?: Yes (appropriate for developmental age) Does the patient have difficulty walking or climbing stairs?: Yes Weakness of Legs: Both Weakness of Arms/Hands: None  Permission Sought/Granted                  Emotional Assessment Appearance:: Appears stated age Attitude/Demeanor/Rapport: Engaged Affect (typically observed): Pleasant Orientation: : Oriented to Self, Oriented to Place, Oriented to  Time, Oriented to Situation Alcohol / Substance Use: Not Applicable Psych Involvement: No (comment)  Admission diagnosis:  Melena [K92.1] Acute GI bleeding [K92.2] Cystitis [N30.90] Symptomatic anemia [D64.9] Patient Active Problem List   Diagnosis Date Noted  . Acute GI bleeding 10/23/2018  . Hypotension 10/23/2018  . AAA (abdominal aortic aneurysm) ([place]) 10/23/2018  . Sleep apnea 10/23/2018  . Type 2 Diabetes mellitus 10/23/2018  . COPD (chronic obstructive pulmonary disease) ([place]) 10/23/2018  . Pacemaker 10/23/2018  . Passage of bloody stools 10/23/2018  . CKD (chronic kidney disease), stage III ([place]) 10/23/2018  . Acute renal failure superimposed on stage 3 chronic kidney disease ([place]) 10/23/2018  .  UTI (urinary tract infection) 10/23/2018  . Sepsis ([place]) 10/23/2018  . Elevated lactic acid level 10/23/2018  . Pressure injury of skin 10/23/2018  . Hyperkalemia 10/23/2018  . Malignant neoplasm of prostate ([place]) 03/13/2018  . OA (osteoarthritis) of hip 07/18/2017  . 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 Right foot drop 11/18/2015  . Hx of decompressive lumbar  laminectomy 11/18/2015  . Diabetic polyneuropathy associated with diabetes mellitus due to underlying condition ([place]) 11/18/2015  . Morbid obesity ([place]) 08/06/2015  . ESSENTIAL HYPERTENSION, BENIGN 10/22/2009  . Coronary atherosclerosis 10/22/2009  . SSS (sick sinus syndrome) ([place]) 10/22/2009   PCP:  Monico Blitz, MD Pharmacy:   [facility], [place] [address] [place] [address] Phone: [phone] Fax: [phone]     Social Determinants of Health (SDOH) Interventions    Readmission Risk Interventions No flowsheet data found.

## 2018-10-25 NOTE — Evaluation (Signed)
Physical Therapy Evaluation Patient Details Name: [name] MRN: [id number] DOB: [birth year] Today's Date: 10/25/2018   History of Present Illness  [name] is a 78 y.o. male with stage III CKD, type 2 diabetes mellitus, AAA, history of prostate cancer presented to ER with concerns about persistent black stools for the past 2 days.  He has had loose stools and generalized weakness over these past couple of days.  He has seen blood in the toilet after a bowel movement.  He denies abdominal pain.  He denies nausea and vomiting.  He denies chest pain and palpitations.  He denies shortness of breath.  He presented with EMS to the ED.  They noted that he was hypotensive on arrival to scene.  He was given a 500 mill bolus on route to ED.  His blood pressure was 88/52.  He responded to IV fluid hydration.  He was noted to be Hemoccult positive.  In addition, the patient takes aspirin daily in addition to diclofenac twice daily for arthritis pain.  He does have a history of significant osteoarthritis.  He was tested to be orthostatic positive in the ED.  He did respond to IV fluid hydration with improvement of his blood pressures.  He was noted to have worsening CKD and hyperkalemia.  He also was noted to have an elevated lactic acid at 2.7.  He was given IV fluid hydration for this.  The patient is having a difficult time standing.  He was noted to have an acute decline in hemoglobin from 13 down to 8.4.  He had a normal PT/INR.  He has also been reported to have had a large maroon-colored stool in the ED.  He was started on IV Protonix.  He has been typed and crossed.  He had a CT of the abdomen and pelvis without contrast with no acute findings noted.  He is being admitted to the stepdown unit.  GI has been consulted.     Clinical Impression  Patient was found awake and alert in bed. Patient performed bed mobility with min A and required a minute of rest before continuing. Patient reported need to  use restroom, therefore therapist pulled bedside commode to the bed. Patient performed stand pivot transfer with min A to commode. Then patient perform stand pivot transfer with several small steps to bedside chair with min A and use of RW. Discussed with patient and his nurse about his right ankle pain and stated that the patient should follow-up with his physician regarding this. Patient had reported that at home he had been able to ambulate with a SPC for short distances. Patient would benefit from continued skilled physical therapy in order to improve patient's strength, activity tolerance, ambulation, and overall functional mobility while he is at the hospital as well as with mentioned venue below.     Follow Up Recommendations SNF    Equipment Recommendations  Rolling walker with 5" wheels(Patient has a 4 wheeled walker at home, but would benefit from 2 wheeled walker if going home for more stability)    Recommendations for Other Services       Precautions / Restrictions Precautions Precautions: Fall Restrictions Weight Bearing Restrictions: No      Mobility  Bed Mobility Overal bed mobility: Needs Assistance Bed Mobility: Supine to Sit     Supine to sit: Min assist     General bed mobility comments: Patient performed supine to sit with Min A therapist supporting patient's upper extremity and back  of trunk  Transfers Overall transfer level: Needs assistance Equipment used: Rolling walker (2 wheeled) Transfers: Stand Pivot Transfers   Stand pivot transfers: Min assist       General transfer comment: Patient required Min A to perform stand pivot transfer to commode and then Min A to perform stand pivot and a few steps to bedside chair  Ambulation/Gait Ambulation/Gait assistance: Min assist Gait Distance (Feet): 3 Feet Assistive device: Rolling walker (2 wheeled) Gait Pattern/deviations: Decreased stride length;Trunk flexed Gait velocity: Decreased gait velocity    General Gait Details: Patient ambulating with forward flexed trunk and decreased gait velocity, heavy reliance on upper extremities for WB  Stairs            Wheelchair Mobility    Modified Rankin (Stroke Patients Only)       Balance Overall balance assessment: Needs assistance Sitting-balance support: Bilateral upper extremity supported Sitting balance-Leahy Scale: Fair     Standing balance support: Bilateral upper extremity supported;During functional activity Standing balance-Leahy Scale: Fair Standing balance comment: Patient demonstrated forward flexed trunk and required bilateral upper extremity support for standing balance                             Pertinent Vitals/Pain Pain Assessment: Faces Faces Pain Scale: Hurts a little bit Pain Location: Right ankle Pain Descriptors / Indicators: Other (Comment)(Hx of foot drop) Pain Intervention(s): Limited activity within patient's tolerance;Monitored during session;Other (comment)(Discussed with patient to report concerns to MD)    [place] expects to be discharged to:: Private residence Living Arrangements: Spouse/significant other Available Help at Discharge: Available 24 hours/day Type of Home: House Home Access: Stairs to enter Entrance Stairs-Rails: Left(Going up) Entrance Stairs-Number of Steps: 2 Home Layout: One level Home Equipment: [place] - 4 wheels;Cane - single point;Grab bars - tub/shower      Prior Function Level of Independence: Independent with assistive device(s)         Comments: Retired Software engineer, 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     Hand Dominance        Extremity/Trunk Assessment   Upper Extremity Assessment Upper Extremity Assessment: Generalized weakness    Lower Extremity Assessment Lower Extremity Assessment: Generalized weakness    Cervical / Trunk Assessment Cervical / Trunk Assessment: Kyphotic  Communication   Communication: No  difficulties  Cognition Arousal/Alertness: Awake/alert Behavior During Therapy: WFL for tasks assessed/performed Overall Cognitive Status: Within Functional Limits for tasks assessed                                        General Comments      Exercises     Assessment/Plan    PT Assessment Patient needs continued PT services  PT Problem List Decreased strength;Decreased activity tolerance;Decreased balance;Decreased mobility       PT Treatment Interventions DME instruction;Gait training;Stair training;Functional mobility training;Therapeutic activities;Therapeutic exercise;Balance training;Neuromuscular re-education;Patient/family education    PT Goals (Current goals can be found in the Care Plan section)  Acute Rehab PT Goals Patient Stated Goal: Agreeable to SNF for further therapy before returning home PT Goal Formulation: With patient Time For Goal Achievement: 11/01/18 Potential to Achieve Goals: Good    Frequency Min 4X/week   Barriers to discharge        Co-evaluation               AM-PAC PT "  6 Clicks" Mobility  Outcome Measure Help needed turning from your back to your side while in a flat bed without using bedrails?: A Lot Help needed moving from lying on your back to sitting on the side of a flat bed without using bedrails?: A Lot Help needed moving to and from a bed to a chair (including a wheelchair)?: A Little Help needed standing up from a chair using your arms (e.g., wheelchair or bedside chair)?: A Little Help needed to walk in hospital room?: A Little Help needed climbing 3-5 steps with a railing? : A Lot 6 Click Score: 15    End of Session Equipment Utilized During Treatment: Gait belt Activity Tolerance: Patient tolerated treatment well;No increased pain;Patient limited by fatigue Patient left: in chair;with call bell/phone within reach;with nursing/sitter in room Nurse Communication: Mobility status;Other (comment)(Discussed  patient following up with MD about right ankle pain) PT Visit Diagnosis: Unsteadiness on feet (R26.81);Other abnormalities of gait and mobility (R26.89);Muscle weakness (generalized) (M62.81)    Time: 6184-8592 PT Time Calculation (min) (ACUTE ONLY): 34 min   Charges:   PT Evaluation $PT Eval Moderate Complexity: 1 Mod         Clarene Critchley PT, DPT 10:45 AM, 10/25/18 [phone]

## 2018-10-26 LAB — GLUCOSE, CAPILLARY
GLUCOSE-CAPILLARY: 132 mg/dL — AB (ref 70–99)
Glucose-Capillary: 119 mg/dL — ABNORMAL HIGH (ref 70–99)
Glucose-Capillary: 134 mg/dL — ABNORMAL HIGH (ref 70–99)
Glucose-Capillary: 156 mg/dL — ABNORMAL HIGH (ref 70–99)
Glucose-Capillary: 126 mg/dL — ABNORMAL HIGH (ref 70–99)

## 2018-10-26 LAB — CBC
HCT: 24.3 % — ABNORMAL LOW (ref 39.0–52.0)
HEMATOCRIT: 24.2 % — AB (ref 39.0–52.0)
Hemoglobin: 7.4 g/dL — ABNORMAL LOW (ref 13.0–17.0)
Hemoglobin: 7.6 g/dL — ABNORMAL LOW (ref 13.0–17.0)
MCH: 29.4 pg (ref 26.0–34.0)
MCH: 30 pg (ref 26.0–34.0)
MCHC: 30.5 g/dL (ref 30.0–36.0)
MCHC: 31.4 g/dL (ref 30.0–36.0)
MCV: 96.4 fL (ref 80.0–100.0)
MCV: 95.7 fL (ref 80.0–100.0)
Platelets: 150 10*3/uL (ref 150–400)
Platelets: 165 10*3/uL (ref 150–400)
RBC: 2.52 MIL/uL — ABNORMAL LOW (ref 4.22–5.81)
RBC: 2.53 MIL/uL — ABNORMAL LOW (ref 4.22–5.81)
RDW: 16.3 % — ABNORMAL HIGH (ref 11.5–15.5)
RDW: 16 % — ABNORMAL HIGH (ref 11.5–15.5)
WBC: 4.8 10*3/uL (ref 4.0–10.5)
WBC: 5.4 10*3/uL (ref 4.0–10.5)
nRBC: 0 % (ref 0.0–0.2)
nRBC: 0 % (ref 0.0–0.2)

## 2018-10-26 LAB — BASIC METABOLIC PANEL
Anion gap: 6 (ref 5–15)
BUN: 45 mg/dL — ABNORMAL HIGH (ref 8–23)
CO2: 19 mmol/L — ABNORMAL LOW (ref 22–32)
Calcium: 8.2 mg/dL — ABNORMAL LOW (ref 8.9–10.3)
Chloride: 116 mmol/L — ABNORMAL HIGH (ref 98–111)
Creatinine, Ser: 1.69 mg/dL — ABNORMAL HIGH (ref 0.61–1.24)
GFR calc Af Amer: 44 mL/min — ABNORMAL LOW (ref >60)
GFR calc non Af Amer: 38 mL/min — ABNORMAL LOW (ref >60)
Glucose, Bld: 132 mg/dL — ABNORMAL HIGH (ref 70–99)
Potassium: 3.7 mmol/L (ref 3.5–5.1)
SODIUM: 141 mmol/L (ref 135–145)

## 2018-10-26 MED ORDER — PANTOPRAZOLE SODIUM 40 MG PO TBEC
40.0000 mg | DELAYED_RELEASE_TABLET | Freq: Two times a day (BID) | ORAL | Status: DC
  Administered 2018-10-26 – 2018-10-27 (×2): 40 mg via ORAL
  Filled 2018-10-26 (×2): qty 1

## 2018-10-26 NOTE — Progress Notes (Signed)
PROGRESS NOTE    [name]  JJH:[id number] DOB: July 10, 1941 DOA: 10/23/2018 PCP: Monico Blitz, MD    Brief Narrative:  78 year old male with history of chronic kidney disease stage III, diabetes, prostate cancer, recent urinary tract infection was taking Bactrim, presents with dark stools, anemia and acute on chronic kidney disease.  He was found to have GI bleeding and underwent transfusion of PRBC.  Seen by GI with plans to undergo EGD.  Overall renal function improving with hydration.  He was taking NSAIDs prior to admission.   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 Upper GI bleeding.  Status post EGD and felt to be related to peptic ulcer disease.  He is completed 72 hours of Protonix infusion and is currently on Protonix p.o. twice daily.  He is also on Carafate.  He has been advised to abstain from any NSAIDs in the future.  Diet has been advanced to full liquids. 2. Acute blood loss anemia.  Transfuse for hemoglobin less than 7.  He received 1 unit of PRBC during his hospital stay.  Follow-up hemoglobins had initially improved, but overnight has trended down.  Will repeat hemoglobin today as well as in a.m. 3. Acute kidney injury on chronic kidney disease stage III.  Likely related to volume  depletion and severe anemia.  NSAID use likely contributing.  He was also taking Bactrim prior to admission.  Improving with IV fluids. 4. Hyperkalemia.  Improved after receiving Lokelma. 5. Urinary tract infection.  Urine culture has shown no growth.  Discontinue further ceftriaxone.  Bladder scan performed did not show any significant postvoid residual volume 6. Sepsis.  Hemodynamics are currently stable.  Lactic acid has improved with IV hydration.  No fevers.  Antibiotics have been discontinued 7. Diabetes.  Holding oral medications.  Continue on sliding scale insulin.  Blood sugars have been stable 8. History of prostate cancer.  Continue follow-up with urology. 9. Pressure injury, stage II on right buttocks, present on admission, continue supportive care   DVT prophylaxis: SCDs Code Status: Full code Family Communication: No family present Disposition Plan: Discharge home once GI work-up is complete and hemoglobin has shown stability   Consultants:   Gastroenterology  Procedures:  EGD:- Normal esophagus.                           - Z-line regular, 40 cm from the incisors.                           - Non-bleeding gastric ulcer with pigmented  material.                           - Non-bleeding deep duodenal ulcers with pigmented                            material.                           - Duodenitis.                            - No specimens collected.  Antimicrobials:   Ceftriaxone 3/25 > 3/27   Subjective: Patient had a dark stool this morning.  He denies any dizziness or lightheadedness.  Objective: Vitals:   10/25/18 2004 10/25/18 2227 10/26/18 0555 10/26/18 1431  BP:  118/61 115/64 (!) 98/57  Pulse:  72 62 75  Resp:  20 18 20   Temp:  98.3 F (36.8 C) (!) 97.5 F (36.4 C)   TempSrc:  Oral Oral   SpO2: 95% 99% 100% 99%  Weight:      Height:        Intake/Output Summary (Last 24 hours) at 10/26/2018 1635 Last data filed at  10/26/2018 1611 Gross per 24 hour  Intake 1320 ml  Output 1250 ml  Net 70 ml   Filed Weights   10/23/18 1230 10/24/18 0500 10/25/18 0342  Weight: 94.5 kg 98.5 kg 96.4 kg    Examination:  General exam: Alert, awake, oriented x 3 Respiratory system: Clear to auscultation. Respiratory effort normal. Cardiovascular system:RRR. No murmurs, rubs, gallops. Gastrointestinal system: Abdomen is nondistended, soft and nontender. No organomegaly or masses felt. Normal bowel sounds heard. Central nervous system: Alert and oriented. No focal neurological deficits. Extremities: 1+ edema bilaterally Skin: No rashes, lesions or ulcers Psychiatry: Judgement and insight appear normal. Mood & affect appropriate.  .     Data Reviewed: I have personally reviewed following labs and imaging studies  CBC: Recent Labs  Lab 10/23/18 0822  10/24/18 0508 10/24/18 1200 10/24/18 1828 10/25/18 0405 10/26/18 0612  WBC 8.7  --  6.7 7.5  --  6.7 4.8  NEUTROABS 7.3  --   --   --   --   --   --   HGB 8.4*   < > 6.9* 8.1* 7.8* 8.3* 7.4*  HCT 26.3*   < > 22.3* 25.6* 25.1* 25.9* 24.3*  MCV 93.6  --  95.3 93.8  --  95.6 96.4  PLT 183  --  154 167  --  177 150   < > = values in this interval not displayed.   Basic Metabolic Panel: Recent Labs  Lab 10/23/18 0821 10/24/18 0508 10/25/18 0405 10/26/18 0612  NA 135 138 140 141  K 5.8* 5.0 4.2 3.7  CL 108 114* 116* 116*  CO2 19* 17* 18* 19*  GLUCOSE 94 99 118* 132*  BUN 126* 106* 66* 45*  CREATININE 3.89* 2.60* 1.82* 1.69*  CALCIUM 8.3* 7.8* 8.1* 8.2*  MG  --  2.4  --   --   PHOS  --  2.5  --   --    GFR: Estimated Creatinine Clearance (by C-G formula based on SCr of 1.69 mg/dL (H)) Male: 33.2 mL/min (A) Male: 40.5 mL/min (A) Liver Function Tests: Recent Labs  Lab 10/23/18 0821 10/24/18 0508  AST 24  20  ALT 15 14  ALKPHOS 92 78  BILITOT 0.4 0.4  PROT 6.2* 5.3*  ALBUMIN 3.0* 2.6*   Recent Labs  Lab 10/23/18 0822  LIPASE 49   No  results for input(s): AMMONIA in the last 168 hours. Coagulation Profile: Recent Labs  Lab 10/23/18 0822  INR 1.1   Cardiac Enzymes: Recent Labs  Lab 10/23/18 0822  TROPONINI <0.03   BNP (last 3 results) No results for input(s): PROBNP in the last 8760 hours. HbA1C: No results for input(s): HGBA1C in the last 72 hours. CBG: Recent Labs  Lab 10/25/18 1657 10/25/18 2229 10/26/18 0329 10/26/18 0739 10/26/18 1130  GLUCAP 111* 116* 132* 119* 134*   Lipid Profile: No results for input(s): CHOL, HDL, LDLCALC, TRIG, CHOLHDL, LDLDIRECT in the last 72 hours. Thyroid Function Tests: No results for input(s): TSH, T4TOTAL, FREET4, T3FREE, THYROIDAB in the last 72 hours. Anemia Panel: No results for input(s): VITAMINB12, FOLATE, FERRITIN, TIBC, IRON, RETICCTPCT in the last 72 hours. Sepsis Labs: Recent Labs  Lab 10/23/18 3710 10/23/18 1105 10/23/18 1653  LATICACIDVEN 2.7* 2.1* 1.2    Recent Results (from the past 240 hour(s))  MRSA PCR Screening     Status: None   Collection Time: 10/23/18  4:00 PM  Result Value Ref Range Status   MRSA by PCR NEGATIVE NEGATIVE Final    Comment:        The GeneXpert MRSA Assay (FDA approved for NASAL specimens only), is one component of a comprehensive MRSA colonization surveillance program. It is not intended to diagnose MRSA infection nor to guide or monitor treatment for MRSA infections. Performed at [facility], [address] [place], [place] [address]   Culture, Urine     Status: None   Collection Time: 10/24/18 11:45 AM  Result Value Ref Range Status   Specimen Description   Final    URINE, CATHETERIZED Performed at [facility], [address], [place], [place] [address]    Special Requests   Final    NONE Performed at [facility], [address] [place], [place] [address]    Culture   Final    NO GROWTH Performed at [facility], [place] [address] [place], [place] [address]    Report Status 10/25/2018 FINAL   Final         Radiology Studies: Dg Foot 2 Views Right  Result Date: 10/25/2018 CLINICAL DATA:  Right foot pain after fall a few days ago. EXAM: RIGHT FOOT - 2 VIEW COMPARISON:  None. FINDINGS: There is no evidence of fracture or dislocation. Severe osteoarthritis is seen involving the first metatarsophalangeal joint. Minimal posterior calcaneal spurring is noted. Vascular calcifications are noted. IMPRESSION: Severe osteoarthritis of first metatarsophalangeal joint. No acute abnormality seen in the right foot. Electronically Signed   By: Marijo Conception, M.D.   On: 10/25/2018 14:36        Scheduled Meds: . atorvastatin  40 mg Oral q1800  . feeding supplement  1 Container Oral TID BM  . feeding supplement (PRO-STAT SUGAR FREE 64)  30 mL Oral BID  . insulin aspart  0-9 Units Subcutaneous TID WC  . sucralfate  1 g Oral TID WC & HS  . tamsulosin  0.8 mg Oral QPC supper   Continuous Infusions:    LOS: 3 days    Time spent: 25mins    Kathie Dike, MD [facility]   If 7PM-7AM, please contact night-coverage [email or web address]  10/26/2018, 4:35 PM

## 2018-10-27 LAB — BASIC METABOLIC PANEL
Anion gap: 7 (ref 5–15)
BUN: 35 mg/dL — ABNORMAL HIGH (ref 8–23)
CO2: 18 mmol/L — ABNORMAL LOW (ref 22–32)
Calcium: 8.3 mg/dL — ABNORMAL LOW (ref 8.9–10.3)
Chloride: 116 mmol/L — ABNORMAL HIGH (ref 98–111)
Creatinine, Ser: 1.53 mg/dL — ABNORMAL HIGH (ref 0.61–1.24)
GFR calc Af Amer: 50 mL/min — ABNORMAL LOW (ref >60)
GFR calc non Af Amer: 43 mL/min — ABNORMAL LOW (ref >60)
Glucose, Bld: 138 mg/dL — ABNORMAL HIGH (ref 70–99)
Potassium: 3.2 mmol/L — ABNORMAL LOW (ref 3.5–5.1)
SODIUM: 141 mmol/L (ref 135–145)

## 2018-10-27 LAB — CBC
HCT: 24.4 % — ABNORMAL LOW (ref 39.0–52.0)
Hemoglobin: 7.7 g/dL — ABNORMAL LOW (ref 13.0–17.0)
MCH: 30.2 pg (ref 26.0–34.0)
MCHC: 31.6 g/dL (ref 30.0–36.0)
MCV: 95.7 fL (ref 80.0–100.0)
NRBC: 0 % (ref 0.0–0.2)
Platelets: 173 10*3/uL (ref 150–400)
RBC: 2.55 MIL/uL — ABNORMAL LOW (ref 4.22–5.81)
RDW: 15.6 % — ABNORMAL HIGH (ref 11.5–15.5)
WBC: 5.8 10*3/uL (ref 4.0–10.5)

## 2018-10-27 LAB — GLUCOSE, CAPILLARY
GLUCOSE-CAPILLARY: 140 mg/dL — AB (ref 70–99)
Glucose-Capillary: 216 mg/dL — ABNORMAL HIGH (ref 70–99)

## 2018-10-27 MED ORDER — SUCRALFATE 1 GM/10ML PO SUSP: 1.0000 g | Freq: Three times a day (TID) | ORAL | 0 refills | Status: DC

## 2018-10-27 MED ORDER — PANTOPRAZOLE SODIUM 40 MG PO TBEC: 40.0000 mg | DELAYED_RELEASE_TABLET | Freq: Two times a day (BID) | ORAL | 1 refills | Status: DC

## 2018-10-27 MED ORDER — ASPIRIN EC 81 MG PO TBEC: 81.0000 mg | DELAYED_RELEASE_TABLET | Freq: Every day | ORAL | Status: AC

## 2018-10-27 NOTE — Progress Notes (Signed)
Removed IV-clean, dry, intact. Reviewed d/c paperwork with patient. Reviewed new medications and where to pick up. Answered all questions. Wheeled stable patient and belongings to short stay entrance where he was picked up by son-in-law.

## 2018-10-27 NOTE — Progress Notes (Signed)
Physical Therapy Treatment Patient Details Name: [name] MRN: [id number] DOB: [birth year] Today's Date: 10/27/2018    History of Present Illness [name] is a 78 y.o. male with stage III CKD, type 2 diabetes mellitus, AAA, history of prostate cancer presented to ER with concerns about persistent black stools for the past 2 days.  He has had loose stools and generalized weakness over these past couple of days.  He has seen blood in the toilet after a bowel movement.  He denies abdominal pain.  He denies nausea and vomiting.  He denies chest pain and palpitations.  He denies shortness of breath.  He presented with EMS to the ED.  They noted that he was hypotensive on arrival to scene.  He was given a 500 mill bolus on route to ED.  His blood pressure was 88/52.  He responded to IV fluid hydration.  He was noted to be Hemoccult positive.  In addition, the patient takes aspirin daily in addition to diclofenac twice daily for arthritis pain.  He does have a history of significant osteoarthritis.  He was tested to be orthostatic positive in the ED.  He did respond to IV fluid hydration with improvement of his blood pressures.  He was noted to have worsening CKD and hyperkalemia.  He also was noted to have an elevated lactic acid at 2.7.  He was given IV fluid hydration for this.  The patient is having a difficult time standing.  He was noted to have an acute decline in hemoglobin from 13 down to 8.4.  He had a normal PT/INR.  He has also been reported to have had a large maroon-colored stool in the ED.  He was started on IV Protonix.  He has been typed and crossed.  He had a CT of the abdomen and pelvis without contrast with no acute findings noted.  He is being admitted to the stepdown unit.  GI has been consulted.    PT Comments    Patient requires less assistance for sitting up at bedside, demonstrates increased endurance/distance for gait training without loss of balance, has to lean over RW  with kyphotic trunk due to generalized weakness and limited for ambulation due to c/o fatigue.  Patient tolerated sitting up in chair after therapy.  Patient will benefit from continued physical therapy in hospital and recommended venue below to increase strength, balance, endurance for safe ADLs and gait.    Follow Up Recommendations  SNF;Supervision - Intermittent;Supervision for mobility/OOB     Equipment Recommendations  Rolling walker with 5" wheels( (Patient has a 4 wheeled walker at home, but would benefit from 2 wheeled walker if going home for more stability))    Recommendations for Other Services       Precautions / Restrictions Precautions Precautions: Fall Restrictions Weight Bearing Restrictions: No    Mobility  Bed Mobility Overal bed mobility: Needs Assistance Bed Mobility: Supine to Sit     Supine to sit: Supervision     General bed mobility comments: increased time, had to use bedrail  Transfers Overall transfer level: Needs assistance Equipment used: Rolling walker (2 wheeled) Transfers: Sit to/from [facility] Sit to Stand: Supervision;Min guard Stand pivot transfers: Supervision;Min guard       General transfer comment: increased BLE strength for sit to stands and transfers using RW  Ambulation/Gait Ambulation/Gait assistance: Min guard Gait Distance (Feet): 40 Feet Assistive device: Rolling walker (2 wheeled) Gait Pattern/deviations: Decreased step length - right;Decreased step length -  left;Decreased stride length Gait velocity: decreased   General Gait Details: increased endurance/distance for ambulation without loss of balance, slightly labored cadence with leaning over RW for support, limited secondary to c/o fatigue   Stairs             Wheelchair Mobility    Modified Rankin (Stroke Patients Only)       Balance Overall balance assessment: Needs assistance Sitting-balance support: Feet supported;No upper  extremity supported Sitting balance-Leahy Scale: Good     Standing balance support: Bilateral upper extremity supported;During functional activity Standing balance-Leahy Scale: Fair Standing balance comment: using RW                            Cognition Arousal/Alertness: Awake/alert Behavior During Therapy: WFL for tasks assessed/performed Overall Cognitive Status: Within Functional Limits for tasks assessed                                        Exercises General Exercises - Lower Extremity Long Arc Quad: Seated;AROM;Strengthening;Both;10 reps Hip Flexion/Marching: Seated;Strengthening;AROM;Both;10 reps Toe Raises: Seated;Strengthening;AROM;Both;10 reps Heel Raises: Seated;AROM;Strengthening;Both;10 reps    General Comments        Pertinent Vitals/Pain Pain Assessment: Faces Faces Pain Scale: Hurts a little bit Pain Location: Right ankle Pain Descriptors / Indicators: Sore Pain Intervention(s): Limited activity within patient's tolerance;Monitored during session    Home Living                      Prior Function            PT Goals (current goals can now be found in the care plan section) Acute Rehab PT Goals Patient Stated Goal: Agreeable to SNF for further therapy before returning home PT Goal Formulation: With patient Time For Goal Achievement: 11/01/18 Potential to Achieve Goals: Good Progress towards PT goals: Progressing toward goals    Frequency    Min 4X/week      PT Plan Current plan remains appropriate    Co-evaluation              AM-PAC PT "6 Clicks" Mobility   Outcome Measure  Help needed turning from your back to your side while in a flat bed without using bedrails?: None Help needed moving from lying on your back to sitting on the side of a flat bed without using bedrails?: A Little Help needed moving to and from a bed to a chair (including a wheelchair)?: A Little Help needed standing up from  a chair using your arms (e.g., wheelchair or bedside chair)?: A Little Help needed to walk in hospital room?: A Little Help needed climbing 3-5 steps with a railing? : A Lot 6 Click Score: 18    End of Session Equipment Utilized During Treatment: Gait belt Activity Tolerance: Patient tolerated treatment well Patient left: in chair;with call bell/phone within reach Nurse Communication: Mobility status PT Visit Diagnosis: Unsteadiness on feet (R26.81);Other abnormalities of gait and mobility (R26.89);Muscle weakness (generalized) (M62.81)     Time: 0160-1093 PT Time Calculation (min) (ACUTE ONLY): 36 min  Charges:  $Gait Training: 8-22 mins $Therapeutic Exercise: 8-22 mins                     10:52 AM, 10/27/18 Lonell Grandchild, MPT Physical Therapist with [facility] 336 [phone] office [phone] mobile phone

## 2018-10-27 NOTE — Discharge Summary (Addendum)
Physician Discharge Summary  [name] [name] FHL:[id number] DOB: 10/11/1940 DOA: 10/23/2018  PCP: Monico Blitz, MD  Admit date: 10/23/2018 Discharge date: 10/27/2018  Admitted From: Home Disposition: Home  Recommendations for Outpatient Follow-up:  1. Follow up with PCP in 1-2 weeks 2. Please obtain BMP/CBC in one week 3. Follow-up with GI as an outpatient for repeat endoscopy in the next 4 to 8 weeks.  Home Health: Physical therapy recommended skilled nursing facility.  Patient has elected to discharge home with home health PT. Equipment/Devices:  Discharge Condition: Stable CODE STATUS: Full code Diet recommendation: Heart healthy, carb modified  Brief/Interim Summary: 78 year old male with with a history of chronic kidney disease stage III, diabetes, prostate cancer, admitted to the hospital with generalized weakness.  He was found to have GI bleeding, significant anemia and underwent transfusion of PRBC.  He was also had acute on chronic kidney injury, felt to be related to volume depletion as well as recent NSAID use.  Discharge Diagnoses:  Principal Problem:   Acute GI bleeding Active Problems:   Coronary atherosclerosis   SSS (sick sinus syndrome) (HCC)   Right foot drop   Diabetic polyneuropathy associated with diabetes mellitus due to underlying condition (HCC)   Malignant neoplasm of prostate (HCC)   Hypotension   AAA (abdominal aortic aneurysm) (HCC)   Sleep apnea   Type 2 Diabetes mellitus   COPD (chronic obstructive pulmonary disease) (HCC)   Pacemaker   Passage of bloody stools   CKD (chronic kidney disease), stage III (HCC)   Acute renal failure superimposed on stage 3 chronic kidney disease (HCC)   UTI (urinary tract infection)   Sepsis (HCC)   Elevated lactic acid level   Pressure injury of skin   Hyperkalemia  1. Upper GI bleeding.    Patient was seen by GI and underwent EGD.  His bleeding was felt to be related to peptic ulcer disease.    He was treated  for 72 hours with Protonix infusion and was subsequently transitioned to twice daily Protonix.  He is also on Carafate.  He has been advised to abstain from any NSAIDs in the future.    Diet was advanced which she has tolerated.  He is not having any further dark stools.. 2. Acute blood loss anemia.    Patient was transfused a total of 1 unit of PRBC during his hospital stay.  Follow-up hemoglobins have been stable.  This can be further followed up as an outpatient. 3. Acute kidney injury on chronic kidney disease stage III.  acute tubular necrosis Likely related to volume depletion and severe anemia.  NSAID use likely contributing.  He was also taking Bactrim prior to admission.    Patient was treated with IV fluids with substantial improvement of creatinine. 4. Hyperkalemia.  Improved after receiving Lokelma. 5. Urinary tract infection was ruled out.  Urine culture has shown no growth.  Discontinue further ceftriaxone.  Bladder scan performed did not show any significant postvoid residual volume 6. SIRS.    Patient initially presented with concerns for sepsis, but infection was ruled out.  Likely had SIRS due to GI bleeding and volume depletion.  Hemodynamics are currently stable.  Lactic acid has improved with IV hydration.  No fevers.  Antibiotics have been discontinued 7. Diabetes.    Treated with sliding scale insulin in the hospital.  Resume oral meds on discharge. 8. History of prostate cancer.  Continue follow-up with urology. 9. Pressure injury, stage II on right buttocks, present on admission,  continue supportive care  Discharge Instructions  Discharge Instructions    Diet - low sodium heart healthy   Complete by:  As directed    Increase activity slowly   Complete by:  As directed      Allergies as of 10/27/2018   No Known Allergies     Medication List    STOP taking these medications   amLODipine 5 MG tablet Commonly known as:  NORVASC   diclofenac 75 MG EC tablet Commonly  known as:  VOLTAREN   losartan 100 MG tablet Commonly known as:  COZAAR     TAKE these medications   aspirin EC 81 MG tablet Take 1 tablet (81 mg total) by mouth daily. Restart in 2 weeks What changed:  additional instructions   atorvastatin 40 MG tablet Commonly known as:  LIPITOR Take 40 mg by mouth daily.   glimepiride 4 MG tablet Commonly known as:  AMARYL Take 2 mg by mouth daily.   metoprolol succinate 50 MG 24 hr tablet Commonly known as:  TOPROL-XL Take 50 mg by mouth daily.   MULTIVITAMIN ADULTS PO Take 1 tablet by mouth daily.   pantoprazole 40 MG tablet Commonly known as:  PROTONIX Take 1 tablet (40 mg total) by mouth 2 (two) times daily before a meal.   pioglitazone 30 MG tablet Commonly known as:  ACTOS Take 30 mg by mouth daily.   sucralfate 1 GM/10ML suspension Commonly known as:  CARAFATE Take 10 mLs (1 g total) by mouth 4 (four) times daily -  with meals and at bedtime.   tamsulosin 0.4 MG Caps capsule Commonly known as:  FLOMAX Take 2 capsules (0.8 mg total) by mouth daily after supper.   traMADol 50 MG tablet Commonly known as:  Ultram Take 1 tablet (50 mg total) by mouth every 6 (six) hours as needed.       No Known Allergies  Consultations:  Gastroenterology   Procedures/Studies: Ct Abdomen Pelvis Wo Contrast  Result Date: 10/23/2018 CLINICAL DATA:  Black tarry stools EXAM: CT ABDOMEN AND PELVIS WITHOUT CONTRAST TECHNIQUE: Multidetector CT imaging of the abdomen and pelvis was performed following the standard protocol without IV contrast. COMPARISON:  03/07/2018 FINDINGS: Lower chest: No acute abnormality. Hepatobiliary: Liver is well visualized and within normal limits. The gallbladder is well distended with multiple dependent densities consistent with small stones. This is stable from the previous exam. Pancreas: Scattered calcifications are noted within the pancreas stable from the previous exam. No mass lesion is noted. Spleen:  Normal in size without focal abnormality. Adrenals/Urinary Tract: Adrenal glands are within normal limits bilaterally. Renal cysts are seen bilaterally as well as multiple nonobstructing renal stones on the right. The largest of these measures 9 mm in greatest dimension. Bladder is decompressed. No ureteral stones are seen. Stomach/Bowel: Gaseous distension of the transverse colon is again noted. The remainder of the colon is within normal limits. The appendix is not well visualized Vascular/Lymphatic: Diffuse aortic calcifications are noted. 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 Mild aneurysmal dilatation of the abdominal aorta is again seen. Stable lymph nodes are noted in the periaortic region and right common iliac chain no new significant lymphadenopathy is noted. Reproductive: Brachytherapy seeds are noted throughout the prostate. Other: No abdominal wall hernia or abnormality. No abdominopelvic ascites. Musculoskeletal: Degenerative and postoperative changes of the thoracolumbar spine are seen. Bilateral hip replacements are noted. IMPRESSION: Stable renal calculi particularly on the right with stable renal cystic change. Cholelithiasis without complicating  factors. Infrarenal abdominal aorta stable from the previous exam. Stable small lymph nodes. No significant increase in lymphadenopathy is noted. Stable gaseous distension of the transverse colon. No definitive mass is seen. Electronically Signed   By: Inez Catalina M.D.   On: 10/23/2018 10:32   Dg Chest 2 View  Result Date: 10/23/2018 CLINICAL DATA:  Rectal pain and blood in stool. History of prostate cancer. EXAM: CHEST - 2 VIEW COMPARISON:  PA and lateral chest 05/09/2018. FINDINGS: Mild left basilar atelectasis or scar is seen. The right lung is clear. No pneumothorax or pleural effusion. Heart size is upper normal. The patient is status post CABG. Pacing device is in place. Thoracolumbar fusion hardware is noted. IMPRESSION: No  acute disease. Electronically Signed   By: Inge Rise M.D.   On: 10/23/2018 09:52   Dg Foot 2 Views Right  Result Date: 10/25/2018 CLINICAL DATA:  Right foot pain after fall a few days ago. EXAM: RIGHT FOOT - 2 VIEW COMPARISON:  None. FINDINGS: There is no evidence of fracture or dislocation. Severe osteoarthritis is seen involving the first metatarsophalangeal joint. Minimal posterior calcaneal spurring is noted. Vascular calcifications are noted. IMPRESSION: Severe osteoarthritis of first metatarsophalangeal joint. No acute abnormality seen in the right foot. Electronically Signed   By: Marijo Conception, M.D.   On: 10/25/2018 14:36       Subjective: Had a watery stool this morning that was brown.  No vomiting.  No abdominal pain.  No shortness of breath.  Discharge Exam: Vitals:   10/26/18 1431 10/26/18 2145 10/27/18 0058 10/27/18 0730  BP: (!) 98/57 135/69  122/60  Pulse: 75 97 78 78  Resp: 20 20 20 18   Temp:  98.8 F (37.1 C)  98.9 F (37.2 C)  TempSrc:  Oral  Oral  SpO2: 99% 99% 98% 100%  Weight:      Height:        General: Pt is alert, awake, not in acute distress Cardiovascular: RRR, S1/S2 +, no rubs, no gallops Respiratory: CTA bilaterally, no wheezing, no rhonchi Abdominal: Soft, NT, ND, bowel sounds + Extremities: Trace edema, no cyanosis    The results of significant diagnostics from this hospitalization (including imaging, microbiology, ancillary and laboratory) are listed below for reference.     Microbiology: Recent Results (from the past 240 hour(s))  MRSA PCR Screening     Status: None   Collection Time: 10/23/18  4:00 PM  Result Value Ref Range Status   MRSA by PCR NEGATIVE NEGATIVE Final    Comment:        The GeneXpert MRSA Assay (FDA approved for NASAL specimens only), is one component of a comprehensive MRSA colonization surveillance program. It is not intended to diagnose MRSA infection nor to guide or monitor treatment for MRSA  infections. Performed at [facility], [address], [place], [place] [address]   Culture, Urine     Status: None   Collection Time: 10/24/18 11:45 AM  Result Value Ref Range Status   Specimen Description   Final    URINE, CATHETERIZED Performed at [facility], [address] [place] [address], [place], [place] [address]    Special Requests   Final    NONE Performed at [facility], [address] [place], [place] [address]    Culture   Final    NO GROWTH Performed at [facility], [place] [address] [place], [place] [address]    Report Status 10/25/2018 FINAL  Final     Labs:  BNP (last 3 results) No results for input(s): BNP in the last 8760 hours. Basic Metabolic Panel: Recent Labs  Lab 10/23/18 0821 10/24/18 0508 10/25/18 0405 10/26/18 0612 10/27/18 0442  NA 135 138 140 141 141  K 5.8* 5.0 4.2 3.7 3.2*  CL 108 114* 116* 116* 116*  CO2 19* 17* 18* 19* 18*  GLUCOSE 94 99 118* 132* 138*  BUN 126* 106* 66* 45* 35*  CREATININE 3.89* 2.60* 1.82* 1.69* 1.53*  CALCIUM 8.3* 7.8* 8.1* 8.2* 8.3*  MG  --  2.4  --   --   --   PHOS  --  2.5  --   --   --    Liver Function Tests: Recent Labs  Lab 10/23/18 0821 10/24/18 0508  AST 24 20  ALT 15 14  ALKPHOS 92 78  BILITOT 0.4 0.4  PROT 6.2* 5.3*  ALBUMIN 3.0* 2.6*   Recent Labs  Lab 10/23/18 0822  LIPASE 49   No results for input(s): AMMONIA in the last 168 hours. CBC: Recent Labs  Lab 10/23/18 0822  10/24/18 1200 10/24/18 1828 10/25/18 0405 10/26/18 0612 10/26/18 1707 10/27/18 0442  WBC 8.7   < > 7.5  --  6.7 4.8 5.4 5.8  NEUTROABS 7.3  --   --   --   --   --   --   --   HGB 8.4*   < > 8.1* 7.8* 8.3* 7.4* 7.6* 7.7*  HCT 26.3*   < > 25.6* 25.1* 25.9* 24.3* 24.2* 24.4*  MCV 93.6   < > 93.8  --  95.6 96.4 95.7 95.7  PLT 183   < > 167  --  177 150 165 173   < > = values in this interval not displayed.   Cardiac Enzymes: Recent Labs  Lab 10/23/18 0822  TROPONINI <0.03   BNP: Invalid input(s): POCBNP CBG: Recent  Labs  Lab 10/26/18 1130 10/26/18 1647 10/26/18 2142 10/27/18 0743 10/27/18 1116  GLUCAP 134* 156* 126* 140* 216*   D-Dimer No results for input(s): DDIMER in the last 72 hours. Hgb A1c No results for input(s): HGBA1C in the last 72 hours. Lipid Profile No results for input(s): CHOL, HDL, LDLCALC, TRIG, CHOLHDL, LDLDIRECT in the last 72 hours. Thyroid function studies No results for input(s): TSH, T4TOTAL, T3FREE, THYROIDAB in the last 72 hours.  Invalid input(s): FREET3 Anemia work up No results for input(s): VITAMINB12, FOLATE, FERRITIN, TIBC, IRON, RETICCTPCT in the last 72 hours. Urinalysis    Component Value Date/Time   COLORURINE YELLOW 10/23/2018 1020   APPEARANCEUR CLOUDY (A) 10/23/2018 1020   LABSPEC 1.020 10/23/2018 1020   PHURINE 5.5 10/23/2018 1020   GLUCOSEU NEGATIVE 10/23/2018 1020   HGBUR MODERATE (A) 10/23/2018 1020   BILIRUBINUR NEGATIVE 10/23/2018 1020   KETONESUR NEGATIVE 10/23/2018 1020   PROTEINUR 30 (A) 10/23/2018 1020   UROBILINOGEN 0.2 08/25/2010 1220   NITRITE NEGATIVE 10/23/2018 1020   LEUKOCYTESUR LARGE (A) 10/23/2018 1020   Sepsis Labs Invalid input(s): PROCALCITONIN,  WBC,  LACTICIDVEN Microbiology Recent Results (from the past 240 hour(s))  MRSA PCR Screening     Status: None   Collection Time: 10/23/18  4:00 PM  Result Value Ref Range Status   MRSA by PCR NEGATIVE NEGATIVE Final    Comment:        The GeneXpert MRSA Assay (FDA approved for NASAL specimens only), is one component of a comprehensive MRSA colonization surveillance program. It is not intended to diagnose MRSA infection nor to  guide or monitor treatment for MRSA infections. Performed at [facility], [address] [place], [place] [address]   Culture, Urine     Status: None   Collection Time: 10/24/18 11:45 AM  Result Value Ref Range Status   Specimen Description   Final    URINE, CATHETERIZED Performed at [facility], [address], [place], [place] [address]     Special Requests   Final    NONE Performed at [facility], [address] [place] [address], [place], [place] [address]    Culture   Final    NO GROWTH Performed at [facility], [place] [address] [place], [place] [address]    Report Status 10/25/2018 FINAL  Final     Time coordinating discharge: 20mins  SIGNED:   Kathie Dike, MD  [facility] 10/27/2018, 6:07 PM   If 7PM-7AM, please contact night-coverage [email or web address]

## 2018-10-28 ENCOUNTER — Encounter (HOSPITAL_COMMUNITY): Payer: Self-pay | Admitting: Internal Medicine

## 2018-10-28 DIAGNOSIS — C61 Malignant neoplasm of prostate: Secondary | ICD-10-CM | POA: Diagnosis not present

## 2018-10-28 DIAGNOSIS — N183 Chronic kidney disease, stage 3 (moderate): Secondary | ICD-10-CM | POA: Diagnosis not present

## 2018-10-28 DIAGNOSIS — Z950 Presence of cardiac pacemaker: Secondary | ICD-10-CM | POA: Diagnosis not present

## 2018-10-28 DIAGNOSIS — I2510 Atherosclerotic heart disease of native coronary artery without angina pectoris: Secondary | ICD-10-CM | POA: Diagnosis not present

## 2018-10-28 DIAGNOSIS — E1142 Type 2 diabetes mellitus with diabetic polyneuropathy: Secondary | ICD-10-CM | POA: Diagnosis not present

## 2018-10-28 DIAGNOSIS — J449 Chronic obstructive pulmonary disease, unspecified: Secondary | ICD-10-CM | POA: Diagnosis not present

## 2018-10-28 DIAGNOSIS — E1122 Type 2 diabetes mellitus with diabetic chronic kidney disease: Secondary | ICD-10-CM | POA: Diagnosis not present

## 2018-10-28 DIAGNOSIS — I495 Sick sinus syndrome: Secondary | ICD-10-CM | POA: Diagnosis not present

## 2018-10-28 DIAGNOSIS — K279 Peptic ulcer, site unspecified, unspecified as acute or chronic, without hemorrhage or perforation: Secondary | ICD-10-CM | POA: Diagnosis not present

## 2018-10-28 DIAGNOSIS — N179 Acute kidney failure, unspecified: Secondary | ICD-10-CM | POA: Diagnosis not present

## 2018-10-28 LAB — H. PYLORI ANTIBODY, IGG

## 2018-10-30 DIAGNOSIS — E1122 Type 2 diabetes mellitus with diabetic chronic kidney disease: Secondary | ICD-10-CM | POA: Diagnosis not present

## 2018-10-30 DIAGNOSIS — Z125 Encounter for screening for malignant neoplasm of prostate: Secondary | ICD-10-CM | POA: Diagnosis not present

## 2018-10-30 DIAGNOSIS — Z79899 Other long term (current) drug therapy: Secondary | ICD-10-CM | POA: Diagnosis not present

## 2018-10-30 DIAGNOSIS — K279 Peptic ulcer, site unspecified, unspecified as acute or chronic, without hemorrhage or perforation: Secondary | ICD-10-CM | POA: Diagnosis not present

## 2018-10-30 DIAGNOSIS — C61 Malignant neoplasm of prostate: Secondary | ICD-10-CM | POA: Diagnosis not present

## 2018-10-30 DIAGNOSIS — N183 Chronic kidney disease, stage 3 (moderate): Secondary | ICD-10-CM | POA: Diagnosis not present

## 2018-10-30 DIAGNOSIS — E7800 Pure hypercholesterolemia, unspecified: Secondary | ICD-10-CM | POA: Diagnosis not present

## 2018-10-30 DIAGNOSIS — E1142 Type 2 diabetes mellitus with diabetic polyneuropathy: Secondary | ICD-10-CM | POA: Diagnosis not present

## 2018-10-30 DIAGNOSIS — R5383 Other fatigue: Secondary | ICD-10-CM | POA: Diagnosis not present

## 2018-10-30 DIAGNOSIS — N179 Acute kidney failure, unspecified: Secondary | ICD-10-CM | POA: Diagnosis not present

## 2018-11-01 DIAGNOSIS — Z6836 Body mass index (BMI) 36.0-36.9, adult: Secondary | ICD-10-CM | POA: Diagnosis not present

## 2018-11-01 DIAGNOSIS — Z299 Encounter for prophylactic measures, unspecified: Secondary | ICD-10-CM | POA: Diagnosis not present

## 2018-11-01 DIAGNOSIS — N183 Chronic kidney disease, stage 3 (moderate): Secondary | ICD-10-CM | POA: Diagnosis not present

## 2018-11-01 DIAGNOSIS — E1142 Type 2 diabetes mellitus with diabetic polyneuropathy: Secondary | ICD-10-CM | POA: Diagnosis not present

## 2018-11-01 DIAGNOSIS — I10 Essential (primary) hypertension: Secondary | ICD-10-CM | POA: Diagnosis not present

## 2018-11-01 DIAGNOSIS — N179 Acute kidney failure, unspecified: Secondary | ICD-10-CM | POA: Diagnosis not present

## 2018-11-01 DIAGNOSIS — I739 Peripheral vascular disease, unspecified: Secondary | ICD-10-CM | POA: Diagnosis not present

## 2018-11-01 DIAGNOSIS — K279 Peptic ulcer, site unspecified, unspecified as acute or chronic, without hemorrhage or perforation: Secondary | ICD-10-CM | POA: Diagnosis not present

## 2018-11-01 DIAGNOSIS — E1165 Type 2 diabetes mellitus with hyperglycemia: Secondary | ICD-10-CM | POA: Diagnosis not present

## 2018-11-01 DIAGNOSIS — K259 Gastric ulcer, unspecified as acute or chronic, without hemorrhage or perforation: Secondary | ICD-10-CM | POA: Diagnosis not present

## 2018-11-01 DIAGNOSIS — E1122 Type 2 diabetes mellitus with diabetic chronic kidney disease: Secondary | ICD-10-CM | POA: Diagnosis not present

## 2018-11-01 DIAGNOSIS — C61 Malignant neoplasm of prostate: Secondary | ICD-10-CM | POA: Diagnosis not present

## 2018-11-04 DIAGNOSIS — E1142 Type 2 diabetes mellitus with diabetic polyneuropathy: Secondary | ICD-10-CM | POA: Diagnosis not present

## 2018-11-04 DIAGNOSIS — N179 Acute kidney failure, unspecified: Secondary | ICD-10-CM | POA: Diagnosis not present

## 2018-11-04 DIAGNOSIS — C61 Malignant neoplasm of prostate: Secondary | ICD-10-CM | POA: Diagnosis not present

## 2018-11-04 DIAGNOSIS — N183 Chronic kidney disease, stage 3 (moderate): Secondary | ICD-10-CM | POA: Diagnosis not present

## 2018-11-04 DIAGNOSIS — E1122 Type 2 diabetes mellitus with diabetic chronic kidney disease: Secondary | ICD-10-CM | POA: Diagnosis not present

## 2018-11-04 DIAGNOSIS — K279 Peptic ulcer, site unspecified, unspecified as acute or chronic, without hemorrhage or perforation: Secondary | ICD-10-CM | POA: Diagnosis not present

## 2018-11-06 DIAGNOSIS — E1122 Type 2 diabetes mellitus with diabetic chronic kidney disease: Secondary | ICD-10-CM | POA: Diagnosis not present

## 2018-11-06 DIAGNOSIS — N179 Acute kidney failure, unspecified: Secondary | ICD-10-CM | POA: Diagnosis not present

## 2018-11-06 DIAGNOSIS — C61 Malignant neoplasm of prostate: Secondary | ICD-10-CM | POA: Diagnosis not present

## 2018-11-06 DIAGNOSIS — N183 Chronic kidney disease, stage 3 (moderate): Secondary | ICD-10-CM | POA: Diagnosis not present

## 2018-11-06 DIAGNOSIS — K279 Peptic ulcer, site unspecified, unspecified as acute or chronic, without hemorrhage or perforation: Secondary | ICD-10-CM | POA: Diagnosis not present

## 2018-11-06 DIAGNOSIS — E1142 Type 2 diabetes mellitus with diabetic polyneuropathy: Secondary | ICD-10-CM | POA: Diagnosis not present

## 2018-11-07 DIAGNOSIS — R3912 Poor urinary stream: Secondary | ICD-10-CM | POA: Diagnosis not present

## 2018-11-07 DIAGNOSIS — C61 Malignant neoplasm of prostate: Secondary | ICD-10-CM | POA: Diagnosis not present

## 2018-11-07 DIAGNOSIS — N3000 Acute cystitis without hematuria: Secondary | ICD-10-CM | POA: Diagnosis not present

## 2018-11-08 DIAGNOSIS — E1142 Type 2 diabetes mellitus with diabetic polyneuropathy: Secondary | ICD-10-CM | POA: Diagnosis not present

## 2018-11-08 DIAGNOSIS — K279 Peptic ulcer, site unspecified, unspecified as acute or chronic, without hemorrhage or perforation: Secondary | ICD-10-CM | POA: Diagnosis not present

## 2018-11-08 DIAGNOSIS — E1122 Type 2 diabetes mellitus with diabetic chronic kidney disease: Secondary | ICD-10-CM | POA: Diagnosis not present

## 2018-11-08 DIAGNOSIS — N183 Chronic kidney disease, stage 3 (moderate): Secondary | ICD-10-CM | POA: Diagnosis not present

## 2018-11-08 DIAGNOSIS — C61 Malignant neoplasm of prostate: Secondary | ICD-10-CM | POA: Diagnosis not present

## 2018-11-08 DIAGNOSIS — N179 Acute kidney failure, unspecified: Secondary | ICD-10-CM | POA: Diagnosis not present

## 2018-11-20 DIAGNOSIS — K279 Peptic ulcer, site unspecified, unspecified as acute or chronic, without hemorrhage or perforation: Secondary | ICD-10-CM | POA: Diagnosis not present

## 2018-12-11 ENCOUNTER — Other Ambulatory Visit: Payer: Self-pay

## 2018-12-11 ENCOUNTER — Ambulatory Visit (INDEPENDENT_AMBULATORY_CARE_PROVIDER_SITE_OTHER): Payer: Medicare Other | Admitting: *Deleted

## 2018-12-11 DIAGNOSIS — I495 Sick sinus syndrome: Secondary | ICD-10-CM

## 2018-12-11 DIAGNOSIS — I442 Atrioventricular block, complete: Secondary | ICD-10-CM | POA: Diagnosis not present

## 2018-12-12 ENCOUNTER — Telehealth: Payer: Self-pay

## 2018-12-12 NOTE — Telephone Encounter (Signed)
Left message for patient to remind of missed remote transmission.  

## 2018-12-16 LAB — CUP PACEART REMOTE DEVICE CHECK
Battery Impedance: 227 Ohm
Battery Remaining Longevity: 120 mo
Battery Voltage: 2.78 V
Brady Statistic AP VP Percent: 0 %
Brady Statistic AP VS Percent: 52 %
Brady Statistic AS VP Percent: 0 %
Brady Statistic AS VS Percent: 48 %
Date Time Interrogation Session: 20200517014440
Implantable Lead Implant Date: 20000419
Implantable Lead Implant Date: 20000419
Implantable Lead Location: 753859
Implantable Lead Location: 753860
Implantable Lead Model: 4023
Implantable Pulse Generator Implant Date: 20150831
Lead Channel Impedance Value: 326 Ohm
Lead Channel Impedance Value: 449 Ohm
Lead Channel Pacing Threshold Amplitude: 0.5 V
Lead Channel Pacing Threshold Amplitude: 0.875 V
Lead Channel Pacing Threshold Pulse Width: 0.4 ms
Lead Channel Pacing Threshold Pulse Width: 0.4 ms
Lead Channel Setting Pacing Amplitude: 2 V
Lead Channel Setting Pacing Amplitude: 2.5 V
Lead Channel Setting Pacing Pulse Width: 0.4 ms
Lead Channel Setting Sensing Sensitivity: 2 mV

## 2018-12-29 NOTE — Progress Notes (Signed)
Remote pacemaker transmission.   

## 2019-02-04 ENCOUNTER — Encounter (INDEPENDENT_AMBULATORY_CARE_PROVIDER_SITE_OTHER): Payer: Self-pay | Admitting: *Deleted

## 2019-02-07 DIAGNOSIS — E1122 Type 2 diabetes mellitus with diabetic chronic kidney disease: Secondary | ICD-10-CM | POA: Diagnosis not present

## 2019-02-07 DIAGNOSIS — Z299 Encounter for prophylactic measures, unspecified: Secondary | ICD-10-CM | POA: Diagnosis not present

## 2019-02-07 DIAGNOSIS — E1142 Type 2 diabetes mellitus with diabetic polyneuropathy: Secondary | ICD-10-CM | POA: Diagnosis not present

## 2019-02-07 DIAGNOSIS — M1990 Unspecified osteoarthritis, unspecified site: Secondary | ICD-10-CM | POA: Diagnosis not present

## 2019-02-07 DIAGNOSIS — I10 Essential (primary) hypertension: Secondary | ICD-10-CM | POA: Diagnosis not present

## 2019-02-07 DIAGNOSIS — E1165 Type 2 diabetes mellitus with hyperglycemia: Secondary | ICD-10-CM | POA: Diagnosis not present

## 2019-02-07 DIAGNOSIS — Z6836 Body mass index (BMI) 36.0-36.9, adult: Secondary | ICD-10-CM | POA: Diagnosis not present

## 2019-02-10 ENCOUNTER — Encounter (INDEPENDENT_AMBULATORY_CARE_PROVIDER_SITE_OTHER): Payer: Self-pay | Admitting: *Deleted

## 2019-02-11 ENCOUNTER — Other Ambulatory Visit (INDEPENDENT_AMBULATORY_CARE_PROVIDER_SITE_OTHER): Payer: Self-pay | Admitting: *Deleted

## 2019-02-11 DIAGNOSIS — K259 Gastric ulcer, unspecified as acute or chronic, without hemorrhage or perforation: Secondary | ICD-10-CM | POA: Insufficient documentation

## 2019-02-11 DIAGNOSIS — K279 Peptic ulcer, site unspecified, unspecified as acute or chronic, without hemorrhage or perforation: Secondary | ICD-10-CM | POA: Insufficient documentation

## 2019-03-12 ENCOUNTER — Ambulatory Visit (INDEPENDENT_AMBULATORY_CARE_PROVIDER_SITE_OTHER): Payer: Medicare Other | Admitting: *Deleted

## 2019-03-12 DIAGNOSIS — I495 Sick sinus syndrome: Secondary | ICD-10-CM

## 2019-03-13 LAB — CUP PACEART REMOTE DEVICE CHECK
Battery Impedance: 250 Ohm
Battery Remaining Longevity: 115 mo
Battery Voltage: 2.78 V
Brady Statistic AP VP Percent: 0 %
Brady Statistic AP VS Percent: 58 %
Brady Statistic AS VP Percent: 0 %
Brady Statistic AS VS Percent: 42 %
Date Time Interrogation Session: 20200813013036
Implantable Lead Implant Date: 20000419
Implantable Lead Implant Date: 20000419
Implantable Lead Location: 753859
Implantable Lead Location: 753860
Implantable Lead Model: 4023
Implantable Pulse Generator Implant Date: 20150831
Lead Channel Impedance Value: 355 Ohm
Lead Channel Impedance Value: 459 Ohm
Lead Channel Pacing Threshold Amplitude: 0.5 V
Lead Channel Pacing Threshold Amplitude: 0.875 V
Lead Channel Pacing Threshold Pulse Width: 0.4 ms
Lead Channel Pacing Threshold Pulse Width: 0.4 ms
Lead Channel Setting Pacing Amplitude: 2 V
Lead Channel Setting Pacing Amplitude: 2.5 V
Lead Channel Setting Pacing Pulse Width: 0.4 ms
Lead Channel Setting Sensing Sensitivity: 2.8 mV

## 2019-03-17 ENCOUNTER — Other Ambulatory Visit (HOSPITAL_COMMUNITY)
Admission: RE | Admit: 2019-03-17 | Discharge: 2019-03-17 | Disposition: A | Discharge status: Home / Self Care | Payer: Medicare Other | Source: Ambulatory Visit | Attending: Internal Medicine | Admitting: Internal Medicine

## 2019-03-17 ENCOUNTER — Other Ambulatory Visit: Payer: Self-pay

## 2019-03-17 DIAGNOSIS — Z20828 Contact with and (suspected) exposure to other viral communicable diseases: Secondary | ICD-10-CM | POA: Diagnosis not present

## 2019-03-17 DIAGNOSIS — Z01812 Encounter for preprocedural laboratory examination: Secondary | ICD-10-CM | POA: Diagnosis not present

## 2019-03-17 LAB — SARS CORONAVIRUS 2 (TAT 6-24 HRS): SARS Coronavirus 2: NEGATIVE

## 2019-03-19 ENCOUNTER — Other Ambulatory Visit: Payer: Self-pay

## 2019-03-19 ENCOUNTER — Encounter (HOSPITAL_COMMUNITY): Payer: Self-pay | Admitting: *Deleted

## 2019-03-19 ENCOUNTER — Ambulatory Visit (HOSPITAL_COMMUNITY)
Admission: RE | Admit: 2019-03-19 | Discharge: 2019-03-19 | Disposition: A | Discharge status: Home / Self Care | Payer: Medicare Other | Attending: Internal Medicine | Admitting: Internal Medicine

## 2019-03-19 ENCOUNTER — Encounter (HOSPITAL_COMMUNITY)
Admission: RE | Disposition: A | Discharge status: Home / Self Care | Payer: Self-pay | Source: Home / Self Care | Attending: Internal Medicine

## 2019-03-19 DIAGNOSIS — Z6833 Body mass index (BMI) 33.0-33.9, adult: Secondary | ICD-10-CM | POA: Diagnosis not present

## 2019-03-19 DIAGNOSIS — E669 Obesity, unspecified: Secondary | ICD-10-CM | POA: Diagnosis not present

## 2019-03-19 DIAGNOSIS — Z8711 Personal history of peptic ulcer disease: Secondary | ICD-10-CM | POA: Diagnosis not present

## 2019-03-19 DIAGNOSIS — J449 Chronic obstructive pulmonary disease, unspecified: Secondary | ICD-10-CM | POA: Diagnosis not present

## 2019-03-19 DIAGNOSIS — K2980 Duodenitis without bleeding: Secondary | ICD-10-CM | POA: Insufficient documentation

## 2019-03-19 DIAGNOSIS — K279 Peptic ulcer, site unspecified, unspecified as acute or chronic, without hemorrhage or perforation: Secondary | ICD-10-CM | POA: Diagnosis not present

## 2019-03-19 DIAGNOSIS — I2510 Atherosclerotic heart disease of native coronary artery without angina pectoris: Secondary | ICD-10-CM | POA: Diagnosis not present

## 2019-03-19 DIAGNOSIS — I714 Abdominal aortic aneurysm, without rupture: Secondary | ICD-10-CM | POA: Diagnosis not present

## 2019-03-19 DIAGNOSIS — Z950 Presence of cardiac pacemaker: Secondary | ICD-10-CM | POA: Diagnosis not present

## 2019-03-19 DIAGNOSIS — Z09 Encounter for follow-up examination after completed treatment for conditions other than malignant neoplasm: Secondary | ICD-10-CM | POA: Insufficient documentation

## 2019-03-19 DIAGNOSIS — G4730 Sleep apnea, unspecified: Secondary | ICD-10-CM | POA: Diagnosis not present

## 2019-03-19 DIAGNOSIS — Z96643 Presence of artificial hip joint, bilateral: Secondary | ICD-10-CM | POA: Insufficient documentation

## 2019-03-19 DIAGNOSIS — Z96653 Presence of artificial knee joint, bilateral: Secondary | ICD-10-CM | POA: Diagnosis not present

## 2019-03-19 DIAGNOSIS — Z79899 Other long term (current) drug therapy: Secondary | ICD-10-CM | POA: Diagnosis not present

## 2019-03-19 DIAGNOSIS — K317 Polyp of stomach and duodenum: Secondary | ICD-10-CM | POA: Insufficient documentation

## 2019-03-19 DIAGNOSIS — Z951 Presence of aortocoronary bypass graft: Secondary | ICD-10-CM | POA: Diagnosis not present

## 2019-03-19 DIAGNOSIS — K3189 Other diseases of stomach and duodenum: Secondary | ICD-10-CM | POA: Diagnosis not present

## 2019-03-19 DIAGNOSIS — E119 Type 2 diabetes mellitus without complications: Secondary | ICD-10-CM | POA: Diagnosis not present

## 2019-03-19 DIAGNOSIS — Z87891 Personal history of nicotine dependence: Secondary | ICD-10-CM | POA: Insufficient documentation

## 2019-03-19 DIAGNOSIS — I495 Sick sinus syndrome: Secondary | ICD-10-CM | POA: Diagnosis not present

## 2019-03-19 DIAGNOSIS — Z7984 Long term (current) use of oral hypoglycemic drugs: Secondary | ICD-10-CM | POA: Diagnosis not present

## 2019-03-19 DIAGNOSIS — I10 Essential (primary) hypertension: Secondary | ICD-10-CM | POA: Insufficient documentation

## 2019-03-19 DIAGNOSIS — Z7982 Long term (current) use of aspirin: Secondary | ICD-10-CM | POA: Diagnosis not present

## 2019-03-19 DIAGNOSIS — K228 Other specified diseases of esophagus: Secondary | ICD-10-CM | POA: Diagnosis not present

## 2019-03-19 DIAGNOSIS — K259 Gastric ulcer, unspecified as acute or chronic, without hemorrhage or perforation: Secondary | ICD-10-CM

## 2019-03-19 HISTORY — PX: ESOPHAGOGASTRODUODENOSCOPY: SHX5428

## 2019-03-19 LAB — GLUCOSE, CAPILLARY: Glucose-Capillary: 123 mg/dL — ABNORMAL HIGH (ref 70–99)

## 2019-03-19 SURGERY — EGD (ESOPHAGOGASTRODUODENOSCOPY)
Anesthesia: Moderate Sedation

## 2019-03-19 MED ORDER — STERILE WATER FOR IRRIGATION IR SOLN
Status: DC | PRN
  Administered 2019-03-19: 2.5 mL

## 2019-03-19 MED ORDER — PANTOPRAZOLE SODIUM 40 MG PO TBEC: 40.0000 mg | DELAYED_RELEASE_TABLET | Freq: Every day | ORAL | 5 refills | Status: DC

## 2019-03-19 MED ORDER — SODIUM CHLORIDE 0.9 % IV SOLN
INTRAVENOUS | Status: DC
  Administered 2019-03-19: 12:00:00 via INTRAVENOUS

## 2019-03-19 MED ORDER — MIDAZOLAM HCL 5 MG/5ML IJ SOLN
INTRAMUSCULAR | Status: AC
  Filled 2019-03-19: qty 10

## 2019-03-19 MED ORDER — LIDOCAINE VISCOUS HCL 2 % MT SOLN
OROMUCOSAL | Status: AC
  Filled 2019-03-19: qty 15

## 2019-03-19 MED ORDER — MEPERIDINE HCL 50 MG/ML IJ SOLN
INTRAMUSCULAR | Status: DC | PRN
  Administered 2019-03-19 (×2): 25 mg via INTRAVENOUS

## 2019-03-19 MED ORDER — LIDOCAINE VISCOUS HCL 2 % MT SOLN
OROMUCOSAL | Status: DC | PRN
  Administered 2019-03-19: 4 mL via OROMUCOSAL

## 2019-03-19 MED ORDER — MEPERIDINE HCL 50 MG/ML IJ SOLN
INTRAMUSCULAR | Status: AC
  Filled 2019-03-19: qty 1

## 2019-03-19 MED ORDER — MIDAZOLAM HCL 5 MG/5ML IJ SOLN
INTRAMUSCULAR | Status: DC | PRN
  Administered 2019-03-19: 1 mg via INTRAVENOUS
  Administered 2019-03-19 (×2): 2 mg via INTRAVENOUS

## 2019-03-19 NOTE — Discharge Instructions (Signed)
Resume usual medications including aspirin as before. Pantoprazole 40 mg by mouth 30 minutes before breakfast daily. Resume usual diet. No driving for 24 hours. Notify if he have epigastric pain nausea vomiting or melena.       Upper Endoscopy, Adult, Care After This sheet gives you information about how to care for yourself after your procedure. Your health care provider may also give you more specific instructions. If you have problems or questions, contact your health care provider. What can I expect after the procedure? After the procedure, it is common to have:  A sore throat.  Mild stomach pain or discomfort.  Bloating.  Nausea. Follow these instructions at home:   Follow instructions from your health care provider about what to eat or drink after your procedure.  Return to your normal activities as told by your health care provider. Ask your health care provider what activities are safe for you.  Take over-the-counter and prescription medicines only as told by your health care provider.  Do not drive for 24 hours if you were given a sedative during your procedure.  Keep all follow-up visits as told by your health care provider. This is important. Contact a health care provider if you have:  A sore throat that lasts longer than one day.  Trouble swallowing. Get help right away if:  You vomit blood or your vomit looks like coffee grounds.  You have: ? A fever. ? Bloody, black, or tarry stools. ? A severe sore throat or you cannot swallow. ? Difficulty breathing. ? Severe pain in your chest or abdomen. Summary  After the procedure, it is common to have a sore throat, mild stomach discomfort, bloating, and nausea.  Do not drive for 24 hours if you were given a sedative during the procedure.  Follow instructions from your health care provider about what to eat or drink after your procedure.  Return to your normal activities as told by your health care  provider. This information is not intended to replace advice given to you by your health care provider. Make sure you discuss any questions you have with your health care provider. Document Released: 01/16/2012 Document Revised: 01/08/2018 Document Reviewed: 12/17/2017 Elsevier Patient Education  2020 [employer].

## 2019-03-19 NOTE — H&P (Addendum)
[name] is an 78 y.o. adult.   Chief Complaint: Patient is here for EGD. HPI: Patient 78 year old Caucasian male who was admitted with GI bleed about 3 months ago.  EGD revealed a gastric and duodenal duodenal ulcers.  He had been low-dose aspirin plus diclofenac.  He does not take diclofenac anymore.  He states he took him for a week to fully recover he missed last hospitalization.  He denies nausea vomiting abdominal pain melena rectal bleeding or diarrhea.  He has good appetite.  He is on low-dose aspirin.  Last dose was 2 days ago. H. pylori serology back in March 2020 was negative.   Past Medical History:  Diagnosis Date  . AAA (abdominal aortic aneurysm) ([place])   . COPD (chronic obstructive pulmonary disease) ([place])   . Coronary artery disease 2000   Status post CABG  . Diabetes mellitus    DM2  . Dyslipidemia   . Dyspnea   . History of kidney stones   . Hypertension   . Obesity   . Osteoarthritis   . Pacemaker   . Prostate cancer ([place])   . Sleep apnea    USING CPAP  . Spinal stenosis   . SSS (sick sinus syndrome) ([place]) 2000   s/p PPM    Past Surgical History:  Procedure Laterality Date  . BIOPSY PROSTATE  01/09/2018  . CATARACT EXTRACTION Left   . CORONARY ARTERY BYPASS GRAFT     2000  . ESOPHAGOGASTRODUODENOSCOPY N/A 10/24/2018   Procedure: ESOPHAGOGASTRODUODENOSCOPY (EGD);  Surgeon: Rogene Houston, MD;  [facility];  Service: Endoscopy;  Laterality: N/A;  . JOINT REPLACEMENT     L hip Dr. Wynelle Link 07-18-17  . L5-S1 Gill decompressive laminectomy    . PACEMAKER INSERTION     MDT implanted for sick sinus syndrome  . PERMANENT PACEMAKER GENERATOR CHANGE N/A 03/30/2014   MDT Adapta L generator change by Dr Rayann Heman  . RADIOACTIVE SEED IMPLANT N/A 06/10/2018   Procedure: RADIOACTIVE SEED IMPLANT/BRACHYTHERAPY IMPLANT;  Surgeon: Cleon Gustin, MD;  Location: [facility];  Service: Urology;  Laterality: N/A;  . REPLACEMENT TOTAL KNEE BILATERAL     BIL   . Right total hip arthroplasty.    . SPACE OAR INSTILLATION N/A 06/10/2018   Procedure: SPACE OAR INSTILLATION;  Surgeon: Cleon Gustin, MD;  [facility];  Service: Urology;  Laterality: N/A;  . TOTAL HIP ARTHROPLASTY Left 07/18/2017   Procedure: LEFT TOTAL HIP ARTHROPLASTY ANTERIOR APPROACH;  Surgeon: [name], MD;  Location[facility];  Service: Orthopedics;  Laterality: Left;  . TRANSRECTAL ULTRASOUND  01/09/2018    Family History  Problem Relation Age of Onset  . CAD Father   . Stroke Father        Deceased, 47  . Hypertension Mother   . Stroke Mother        Deceased, 71  . Heart disease Mother   . Stroke Brother        Deceased, 49  . Hypertension Sister        Deceased, 43  . Heart disease Sister   . Healthy Son   . Breast cancer Daughter   . Prostate cancer Neg Hx   . Colon cancer Neg Hx   . Pancreatic cancer Neg Hx    Social History:  reports that she quit smoking about 20 years ago. Her smoking use included cigarettes. She started smoking about 58 years ago. She has a 42.00 pack-year smoking history. She has never  used smokeless tobacco. She reports that she does not drink alcohol or use drugs.  Allergies: No Known Allergies  Medications Prior to Admission  Medication Sig Dispense Refill  . acetaminophen (TYLENOL) 500 MG tablet Take 1,000 mg by mouth every 6 (six) hours as needed for moderate pain or headache.    [name] aspirin EC 81 MG tablet Take 1 tablet (81 mg total) by mouth daily. Restart in 2 weeks (Patient taking differently: Take 81 mg by mouth daily. )    . atorvastatin (LIPITOR) 40 MG tablet Take 40 mg by mouth at bedtime.     [name] glimepiride (AMARYL) 4 MG tablet Take 2 mg by mouth daily.     . metoprolol succinate (TOPROL-XL) 50 MG 24 hr tablet Take 50 mg by mouth daily.     . Multiple Vitamins-Minerals (MULTIVITAMIN ADULTS PO) Take 1 tablet by mouth daily.     . nitroGLYCERIN (NITROSTAT) 0.4 MG SL tablet Place 0.4 mg under the tongue every 5  (five) minutes as needed for chest pain.    . pioglitazone (ACTOS) 30 MG tablet Take 15 mg by mouth every evening.     . tamsulosin (FLOMAX) 0.4 MG CAPS capsule Take 2 capsules (0.8 mg total) by mouth daily after supper. (Patient taking differently: Take 0.4 mg by mouth every evening. ) 60 capsule 3  . traMADol (ULTRAM) 50 MG tablet Take 1 tablet (50 mg total) by mouth every 6 (six) hours as needed. (Patient taking differently: Take 50 mg by mouth daily before breakfast. ) 30 tablet 0  . pantoprazole (PROTONIX) 40 MG tablet Take 1 tablet (40 mg total) by mouth 2 (two) times daily before a meal. (Patient not taking: Reported on 03/12/2019) 60 tablet 1  . sucralfate (CARAFATE) 1 GM/10ML suspension Take 10 mLs (1 g total) by mouth 4 (four) times daily -  with meals and at bedtime. (Patient not taking: Reported on 03/12/2019) 420 mL 0    Results for orders placed or performed during the hospital encounter of 03/19/19 (from the past 48 hour(s))  Glucose, capillary     Status: Abnormal   Collection Time: 03/19/19 11:47 AM  Result Value Ref Range   Glucose-Capillary 123 (H) 70 - 99 mg/dL   No results found.  ROS  Pulse 86, temperature 98 F (36.7 C), temperature source Oral, resp. rate (!) 22, height 5\' 7"  (1.702 m), weight 96.2 kg, SpO2 96 %. Physical Exam  Constitutional: She is oriented to person, place, and time. She appears well-developed and well-nourished.  HENT:  Mouth/Throat: Oropharynx is clear and moist.  Eyes: Conjunctivae are normal. No scleral icterus.  Neck: No thyromegaly present.  Cardiovascular: Normal rate and regular rhythm.  Murmur heard. Faint systolic murmur at left sternal border.  Respiratory: Effort normal and breath sounds normal.  GI:  Abdomen is full but soft and nontender with organomegaly or masses.  Musculoskeletal:        General: No edema.  Lymphadenopathy:    She has no cervical adenopathy.  Neurological: She is alert and oriented to person, place, and  time.  Skin: Skin is warm and dry.     Assessment/Plan Follow-up for peptic ulcer disease.  Patient had both gastric and duodenal ulcers. Diagnostic EGD.  Hildred Laser, MD 03/19/2019, 12:32 PM

## 2019-03-19 NOTE — Op Note (Signed)
[facility] Patient Name: [name] Procedure Date: 03/19/2019 11:44 AM MRN: [id number] Date of Birth: [birth year] Attending MD: Hildred Laser , MD CSN: [id number] Age: 78 Admit Type: Outpatient Procedure:                Upper GI endoscopy Indications:              Follow-up of peptic ulcer Providers:                Hildred Laser, MD, Otis Peak B. Sharon Seller, RN, Raphael Gibney, Technician Referring MD:             Fuller Canada Manuella Ghazi, MD Medicines:                Lidocaine spray, Meperidine 50 mg IV, Midazolam 5                            mg IV Complications:            No immediate complications. Estimated Blood Loss:     Estimated blood loss: none. Procedure:                Pre-Anesthesia Assessment:                           - Prior to the procedure, a History and Physical                            was performed, and patient medications and                            allergies were reviewed. The patient's tolerance of                            previous anesthesia was also reviewed. The risks                            and benefits of the procedure and the sedation                            options and risks were discussed with the patient.                            All questions were answered, and informed consent                            was obtained. Prior Anticoagulants: The patient has                            taken no previous anticoagulant or antiplatelet                            agents except for aspirin. ASA Grade Assessment:  III - A patient with severe systemic disease. After                            reviewing the risks and benefits, the patient was                            deemed in satisfactory condition to undergo the                            procedure.                           After obtaining informed consent, the endoscope was                            passed under direct vision. Throughout the                          procedure, the patient's blood pressure, pulse, and                            oxygen saturations were monitored continuously. The                            GIF-H190 ([id number]) scope was introduced through the                            mouth, and advanced to the second part of duodenum.                            The upper GI endoscopy was accomplished without                            difficulty. The patient tolerated the procedure                            well. Scope In: 09:73:53 PM Scope Out: 12:53:16 PM Total Procedure Duration: 0 hours 7 minutes 2 seconds  Findings:      The examined esophagus was normal.      The Z-line was irregular.      A few small sessile polyps with no stigmata of recent bleeding were       found in the gastric fundus.      A healed ulcer was found at the incisura.      The exam of the stomach was otherwise normal.      Patchy mild inflammation characterized by congestion (edema) and       erythema was found in the duodenal bulb. bulb was narrowed and deformed       due healed ulcers.      The second portion of the duodenum was normal. Impression:               - Normal esophagus.                           - Z-line irregular.                           -  A few gastric polyps.                           - Scar in the incisura.                           - Duodenitis with non-critical narrowing.                           - Normal second portion of the duodenum.                           - No specimens collected. Moderate Sedation:      Moderate (conscious) sedation was administered by the endoscopy nurse       and supervised by the endoscopist. The following parameters were       monitored: oxygen saturation, heart rate, blood pressure, CO2       capnography and response to care. Total physician intraservice time was       15 minutes. Recommendation:           - Patient has a contact number available for                             emergencies. The signs and symptoms of potential                            delayed complications were discussed with the                            patient. Return to normal activities tomorrow.                            Written discharge instructions were provided to the                            patient.                           - Resume previous diet today.                           - Continue present medications.                           - Decrease Pantopraqzole to 40 mg po qam.                           - Resume aspirin at prior dose today but do not                            take other NSAIDS such as Ibuprofen. Procedure Code(s):        --- Professional ---                           [id number], Esophagogastroduodenoscopy, flexible,  transoral; diagnostic, including collection of                            specimen(s) by brushing or washing, when performed                            (separate procedure)                           G0500, Moderate sedation services provided by the                            same physician or other qualified health care                            professional performing a gastrointestinal                            endoscopic service that sedation supports,                            requiring the presence of an independent trained                            observer to assist in the monitoring of the                            patient's level of consciousness and physiological                            status; initial 15 minutes of intra-service time;                            patient age 1 years or older (additional time may                            be reported with [id number], as appropriate) Diagnosis Code(s):        --- Professional ---                           K22.8, Other specified diseases of esophagus                           K31.7, Polyp of stomach and duodenum                           K31.89, Other diseases of  stomach and duodenum                           K29.80, Duodenitis without bleeding                           K27.9, Peptic ulcer, site unspecified, unspecified  as acute or chronic, without hemorrhage or                            perforation CPT copyright 2019 American [employer] Association. All rights reserved. The codes documented in this report are preliminary and upon coder review may  be revised to meet current compliance requirements. Hildred Laser, MD Hildred Laser, MD 03/19/2019 1:08:43 PM This report has been signed electronically. Number of Addenda: 0

## 2019-03-20 ENCOUNTER — Encounter: Payer: Self-pay | Admitting: Cardiology

## 2019-03-20 NOTE — Progress Notes (Signed)
Remote pacemaker transmission.   

## 2019-03-25 ENCOUNTER — Encounter (HOSPITAL_COMMUNITY): Payer: Self-pay | Admitting: Internal Medicine

## 2019-03-25 DIAGNOSIS — Z23 Encounter for immunization: Secondary | ICD-10-CM | POA: Diagnosis not present

## 2019-05-02 ENCOUNTER — Telehealth (INDEPENDENT_AMBULATORY_CARE_PROVIDER_SITE_OTHER): Payer: Medicare Other | Admitting: Internal Medicine

## 2019-05-02 ENCOUNTER — Encounter: Payer: Self-pay | Admitting: Internal Medicine

## 2019-05-02 VITALS — BP 132/86 | HR 82 | Ht 67.0 in | Wt 218.0 lb

## 2019-05-02 DIAGNOSIS — I714 Abdominal aortic aneurysm, without rupture, unspecified: Secondary | ICD-10-CM

## 2019-05-02 DIAGNOSIS — I10 Essential (primary) hypertension: Secondary | ICD-10-CM | POA: Diagnosis not present

## 2019-05-02 DIAGNOSIS — I2510 Atherosclerotic heart disease of native coronary artery without angina pectoris: Secondary | ICD-10-CM

## 2019-05-02 DIAGNOSIS — I495 Sick sinus syndrome: Secondary | ICD-10-CM

## 2019-05-02 NOTE — Progress Notes (Signed)
Electrophysiology TeleHealth Note   Due to national recommendations of social distancing due to [place] 19, an audio telehealth visit is felt to be most appropriate for this patient at this time.  Verbal consent was obtained by me for the telehealth visit today.  The patient does not have capability for a virtual visit.  A phone visit is therefore required today.   Date:  05/02/2019   ID:  [name], DOB 07-30-1941, MRN [id number]  Location: patient's home  Provider location:  [facility]  Evaluation Performed: Follow-up visit  PCP:  Monico Blitz, MD   Electrophysiologist:  Dr Rayann Heman  Chief Complaint:  Pacemaker follow up  History of Present Illness:    [name] is a 78 y.o. adult who presents via telehealth conferencing today.  Since last being seen in our clinic, the patient reports doing very well.  Today, she denies symptoms of palpitations, chest pain, shortness of breath,  lower extremity edema, dizziness, presyncope, or syncope.  The patient is otherwise without complaint today.  The patient denies symptoms of fevers, chills, cough, or new SOB worrisome for COVID 19.  Past Medical History:  Diagnosis Date  . AAA (abdominal aortic aneurysm) ([place])   . COPD (chronic obstructive pulmonary disease) ([place])   . Coronary artery disease 2000   Status post CABG  . Diabetes mellitus    DM2  . Dyslipidemia   . Dyspnea   . History of kidney stones   . Hypertension   . Obesity   . Osteoarthritis   . Pacemaker   . Prostate cancer ([place])   . Sleep apnea    USING CPAP  . Spinal stenosis   . SSS (sick sinus syndrome) ([place]) 2000   s/p PPM    Past Surgical History:  Procedure Laterality Date  . BIOPSY PROSTATE  01/09/2018  . CATARACT EXTRACTION Left   . CORONARY ARTERY BYPASS GRAFT     2000  . ESOPHAGOGASTRODUODENOSCOPY N/A 10/24/2018   Procedure: ESOPHAGOGASTRODUODENOSCOPY (EGD);  Surgeon: Rogene Houston, MD;  [facility];  Service: Endoscopy;   Laterality: N/A;  . ESOPHAGOGASTRODUODENOSCOPY N/A 03/19/2019   Procedure: ESOPHAGOGASTRODUODENOSCOPY (EGD);  Surgeon: Rogene Houston, MD;  [facility];  Service: Endoscopy;  Laterality: N/A;  255  . JOINT REPLACEMENT     L hip Dr. Wynelle Link 07-18-17  . L5-S1 Gill decompressive laminectomy    . PACEMAKER INSERTION     MDT implanted for sick sinus syndrome  . PERMANENT PACEMAKER GENERATOR CHANGE N/A 03/30/2014   MDT Adapta L generator change by Dr Rayann Heman  . RADIOACTIVE SEED IMPLANT N/A 06/10/2018   Procedure: RADIOACTIVE SEED IMPLANT/BRACHYTHERAPY IMPLANT;  Surgeon: Cleon Gustin, MD;  [facility];  Service: Urology;  Laterality: N/A;  . REPLACEMENT TOTAL KNEE BILATERAL     BIL  . Right total hip arthroplasty.    . SPACE OAR INSTILLATION N/A 06/10/2018   Procedure: SPACE OAR INSTILLATION;  Surgeon: Cleon Gustin, MD;  [facility];  Service: Urology;  Laterality: N/A;  . TOTAL HIP ARTHROPLASTY Left 07/18/2017   Procedure: LEFT TOTAL HIP ARTHROPLASTY ANTERIOR APPROACH;  Surgeon: [name], MD;  [facility];  Service: Orthopedics;  Laterality: Left;  . TRANSRECTAL ULTRASOUND  01/09/2018    Current Outpatient Medications  Medication Sig Dispense Refill  . acetaminophen (TYLENOL) 500 MG tablet Take 1,000 mg by mouth every 6 (six) hours as needed for moderate pain or headache.    [name] aspirin EC  81 MG tablet Take 1 tablet (81 mg total) by mouth daily. Restart in 2 weeks (Patient taking differently: Take 81 mg by mouth daily. )    . atorvastatin (LIPITOR) 40 MG tablet Take 40 mg by mouth at bedtime.     [name] glimepiride (AMARYL) 4 MG tablet Take 2 mg by mouth daily.     . metoprolol succinate (TOPROL-XL) 50 MG 24 hr tablet Take 50 mg by mouth daily.     . Multiple Vitamins-Minerals (MULTIVITAMIN ADULTS PO) Take 1 tablet by mouth daily.     . nitroGLYCERIN (NITROSTAT) 0.4 MG SL tablet Place 0.4 mg under the tongue every 5 (five) minutes as needed for chest  pain.    . pantoprazole (PROTONIX) 40 MG tablet Take 1 tablet (40 mg total) by mouth daily before breakfast. 30 tablet 5  . pioglitazone (ACTOS) 30 MG tablet Take 15 mg by mouth every evening.     . tamsulosin (FLOMAX) 0.4 MG CAPS capsule Take 0.4 mg by mouth daily.    . traMADol (ULTRAM) 50 MG tablet Take 50 mg by mouth daily.     No current facility-administered medications for this visit.     Allergies:   Patient has no known allergies.   Social History:  The patient  reports that she quit smoking about 20 years ago. Her smoking use included cigarettes. She started smoking about 58 years ago. She has a 42.00 pack-year smoking history. She has never used smokeless tobacco. She reports that she does not drink alcohol or use drugs.   Family History:  The patient's family history includes Breast cancer in her daughter; CAD in her father; Healthy in her son; Heart disease in her mother and sister; Hypertension in her mother and sister; Stroke in her brother, father, and mother.   ROS:  Please see the history of present illness.   All other systems are personally reviewed and negative.    Exam:    Vital Signs:  BP 132/86   Pulse 82   Ht 5\' 7"  (1.702 m)   Wt 218 lb (98.9 kg)   BMI 34.14 kg/m   Well sounding and appearing, alert and conversant, regular work of breathing  Labs/Other Tests and Data Reviewed:    Recent Labs: 10/24/2018: ALT 14; Magnesium 2.4 10/27/2018: BUN 35; Creatinine, Ser 1.53; Hemoglobin 7.7; Platelets 173; Potassium 3.2; Sodium 141   Wt Readings from Last 3 Encounters:  05/02/19 218 lb (98.9 kg)  03/19/19 212 lb (96.2 kg)  10/25/18 212 lb 8.4 oz (96.4 kg)     Last device remote is reviewed from [employer] PDF which reveals normal device function, no arrhythmias    ASSESSMENT & PLAN:    1.  Sinus bradycardia Normal pacemaker function by recent remote See PaceArt report RV noise is chronic (programmed unipolar), rarely V paces  2.  HTN Stable No change  required today  3.  CAD Stable No change required today  4.  AAA Followed by [facility]   Follow-up:  [facility], me in 1 year    Patient Risk:  after full review of this patients clinical status, I feel that they are at moderate risk at this time.  Today, I have spent 15 minutes with the patient with telehealth technology discussing arrhythmia management .    Army Fossa, MD  05/02/2019 10:01 AM     [facility] [address] [place]  [place] [address] [phone] (office) [phone] (fax)

## 2019-05-07 ENCOUNTER — Ambulatory Visit (INDEPENDENT_AMBULATORY_CARE_PROVIDER_SITE_OTHER): Payer: Medicare Other | Admitting: Urology

## 2019-05-07 DIAGNOSIS — R3912 Poor urinary stream: Secondary | ICD-10-CM

## 2019-05-07 DIAGNOSIS — C61 Malignant neoplasm of prostate: Secondary | ICD-10-CM | POA: Diagnosis not present

## 2019-05-14 DIAGNOSIS — E1165 Type 2 diabetes mellitus with hyperglycemia: Secondary | ICD-10-CM | POA: Diagnosis not present

## 2019-05-14 DIAGNOSIS — I10 Essential (primary) hypertension: Secondary | ICD-10-CM | POA: Diagnosis not present

## 2019-05-14 DIAGNOSIS — Z299 Encounter for prophylactic measures, unspecified: Secondary | ICD-10-CM | POA: Diagnosis not present

## 2019-05-14 DIAGNOSIS — Z713 Dietary counseling and surveillance: Secondary | ICD-10-CM | POA: Diagnosis not present

## 2019-05-14 DIAGNOSIS — C61 Malignant neoplasm of prostate: Secondary | ICD-10-CM | POA: Diagnosis not present

## 2019-05-14 DIAGNOSIS — Z6841 Body Mass Index (BMI) 40.0 and over, adult: Secondary | ICD-10-CM | POA: Diagnosis not present

## 2019-06-11 ENCOUNTER — Ambulatory Visit (INDEPENDENT_AMBULATORY_CARE_PROVIDER_SITE_OTHER): Payer: Medicare Other | Admitting: *Deleted

## 2019-06-11 DIAGNOSIS — I442 Atrioventricular block, complete: Secondary | ICD-10-CM

## 2019-06-11 DIAGNOSIS — I495 Sick sinus syndrome: Secondary | ICD-10-CM | POA: Diagnosis not present

## 2019-06-12 LAB — CUP PACEART REMOTE DEVICE CHECK
Battery Impedance: 251 Ohm
Battery Remaining Longevity: 115 mo
Battery Voltage: 2.78 V
Brady Statistic AP VP Percent: 0 %
Brady Statistic AP VS Percent: 61 %
Brady Statistic AS VP Percent: 0 %
Brady Statistic AS VS Percent: 38 %
Date Time Interrogation Session: 20201112105034
Implantable Lead Implant Date: 20000419
Implantable Lead Implant Date: 20000419
Implantable Lead Location: 753859
Implantable Lead Location: 753860
Implantable Lead Model: 4023
Implantable Pulse Generator Implant Date: 20150831
Lead Channel Impedance Value: 341 Ohm
Lead Channel Impedance Value: 399 Ohm
Lead Channel Pacing Threshold Amplitude: 0.5 V
Lead Channel Pacing Threshold Amplitude: 0.875 V
Lead Channel Pacing Threshold Pulse Width: 0.4 ms
Lead Channel Pacing Threshold Pulse Width: 0.4 ms
Lead Channel Setting Pacing Amplitude: 2 V
Lead Channel Setting Pacing Amplitude: 2.5 V
Lead Channel Setting Pacing Pulse Width: 0.4 ms
Lead Channel Setting Sensing Sensitivity: 2 mV

## 2019-07-03 NOTE — Progress Notes (Signed)
Remote pacemaker transmission.   

## 2019-08-06 ENCOUNTER — Other Ambulatory Visit: Payer: Self-pay

## 2019-08-06 DIAGNOSIS — C61 Malignant neoplasm of prostate: Secondary | ICD-10-CM

## 2019-08-07 IMAGING — NM NM MYOCAR MULTI W/SPECT W/WALL MOTION & EF
2 series · 12 of 12 positions shown · non-contrast
Comparison: none

[Series 1: rest · 6.51mm/px · 6 of 64 frames shown]
[frame 6/64]
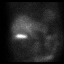
[frame 16/64]
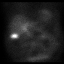
[frame 27/64]
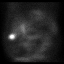
[frame 38/64]
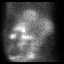
[frame 48/64]
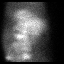
[frame 59/64]
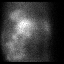

[Series 2: stress gated · 6.51mm/px · 6 of 64 frames shown]
[frame 6/64]
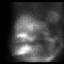
[frame 16/64]
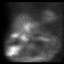
[frame 27/64]
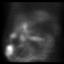
[frame 38/64]
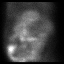
[frame 48/64]
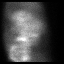
[frame 59/64]
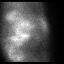

[12 of 12 positions shown; findings below may reference images not displayed]

Canned report from images found in remote index.

Refer to host system for actual result text.

## 2019-08-20 DIAGNOSIS — M1990 Unspecified osteoarthritis, unspecified site: Secondary | ICD-10-CM | POA: Diagnosis not present

## 2019-08-20 DIAGNOSIS — C61 Malignant neoplasm of prostate: Secondary | ICD-10-CM | POA: Diagnosis not present

## 2019-08-20 DIAGNOSIS — Z299 Encounter for prophylactic measures, unspecified: Secondary | ICD-10-CM | POA: Diagnosis not present

## 2019-08-20 DIAGNOSIS — Z87891 Personal history of nicotine dependence: Secondary | ICD-10-CM | POA: Diagnosis not present

## 2019-08-20 DIAGNOSIS — E1165 Type 2 diabetes mellitus with hyperglycemia: Secondary | ICD-10-CM | POA: Diagnosis not present

## 2019-08-20 DIAGNOSIS — I10 Essential (primary) hypertension: Secondary | ICD-10-CM | POA: Diagnosis not present

## 2019-08-20 DIAGNOSIS — I714 Abdominal aortic aneurysm, without rupture: Secondary | ICD-10-CM | POA: Diagnosis not present

## 2019-08-20 DIAGNOSIS — Z6836 Body mass index (BMI) 36.0-36.9, adult: Secondary | ICD-10-CM | POA: Diagnosis not present

## 2019-08-25 DIAGNOSIS — I714 Abdominal aortic aneurysm, without rupture: Secondary | ICD-10-CM | POA: Diagnosis not present

## 2019-09-05 DIAGNOSIS — Z23 Encounter for immunization: Secondary | ICD-10-CM | POA: Diagnosis not present

## 2019-09-10 ENCOUNTER — Ambulatory Visit (INDEPENDENT_AMBULATORY_CARE_PROVIDER_SITE_OTHER): Payer: Medicare Other | Admitting: *Deleted

## 2019-09-10 DIAGNOSIS — I495 Sick sinus syndrome: Secondary | ICD-10-CM | POA: Diagnosis not present

## 2019-09-11 LAB — CUP PACEART REMOTE DEVICE CHECK
Battery Impedance: 275 Ohm
Battery Remaining Longevity: 111 mo
Battery Voltage: 2.78 V
Brady Statistic AP VP Percent: 0 %
Brady Statistic AP VS Percent: 64 %
Brady Statistic AS VP Percent: 0 %
Brady Statistic AS VS Percent: 36 %
Date Time Interrogation Session: 20210211122635
Implantable Lead Implant Date: 20000419
Implantable Lead Implant Date: 20000419
Implantable Lead Location: 753859
Implantable Lead Location: 753860
Implantable Lead Model: 4023
Implantable Pulse Generator Implant Date: 20150831
Lead Channel Impedance Value: 351 Ohm
Lead Channel Impedance Value: 405 Ohm
Lead Channel Pacing Threshold Amplitude: 0.375 V
Lead Channel Pacing Threshold Amplitude: 0.875 V
Lead Channel Pacing Threshold Pulse Width: 0.4 ms
Lead Channel Pacing Threshold Pulse Width: 0.4 ms
Lead Channel Setting Pacing Amplitude: 2 V
Lead Channel Setting Pacing Amplitude: 2.5 V
Lead Channel Setting Pacing Pulse Width: 0.4 ms
Lead Channel Setting Sensing Sensitivity: 2.8 mV

## 2019-10-02 DIAGNOSIS — Z299 Encounter for prophylactic measures, unspecified: Secondary | ICD-10-CM | POA: Diagnosis not present

## 2019-10-02 DIAGNOSIS — R5383 Other fatigue: Secondary | ICD-10-CM | POA: Diagnosis not present

## 2019-10-02 DIAGNOSIS — Z1339 Encounter for screening examination for other mental health and behavioral disorders: Secondary | ICD-10-CM | POA: Diagnosis not present

## 2019-10-02 DIAGNOSIS — Z7189 Other specified counseling: Secondary | ICD-10-CM | POA: Diagnosis not present

## 2019-10-02 DIAGNOSIS — Z0000 Encounter for general adult medical examination without abnormal findings: Secondary | ICD-10-CM | POA: Diagnosis not present

## 2019-10-02 DIAGNOSIS — Z1211 Encounter for screening for malignant neoplasm of colon: Secondary | ICD-10-CM | POA: Diagnosis not present

## 2019-10-02 DIAGNOSIS — E1165 Type 2 diabetes mellitus with hyperglycemia: Secondary | ICD-10-CM | POA: Diagnosis not present

## 2019-10-02 DIAGNOSIS — Z1331 Encounter for screening for depression: Secondary | ICD-10-CM | POA: Diagnosis not present

## 2019-10-02 DIAGNOSIS — Z6841 Body Mass Index (BMI) 40.0 and over, adult: Secondary | ICD-10-CM | POA: Diagnosis not present

## 2019-10-02 DIAGNOSIS — E7800 Pure hypercholesterolemia, unspecified: Secondary | ICD-10-CM | POA: Diagnosis not present

## 2019-10-02 DIAGNOSIS — Z79899 Other long term (current) drug therapy: Secondary | ICD-10-CM | POA: Diagnosis not present

## 2019-10-03 ENCOUNTER — Other Ambulatory Visit: Payer: Self-pay

## 2019-10-04 DIAGNOSIS — Z23 Encounter for immunization: Secondary | ICD-10-CM | POA: Diagnosis not present

## 2019-10-21 ENCOUNTER — Other Ambulatory Visit: Payer: Self-pay | Admitting: Urology

## 2019-11-16 ENCOUNTER — Other Ambulatory Visit (INDEPENDENT_AMBULATORY_CARE_PROVIDER_SITE_OTHER): Payer: Self-pay | Admitting: Internal Medicine

## 2019-11-18 NOTE — Telephone Encounter (Signed)
Patient needs to make appointment to be seen prior to further refills.

## 2019-11-20 DIAGNOSIS — I739 Peripheral vascular disease, unspecified: Secondary | ICD-10-CM | POA: Diagnosis not present

## 2019-11-20 DIAGNOSIS — M79672 Pain in left foot: Secondary | ICD-10-CM | POA: Diagnosis not present

## 2019-11-20 DIAGNOSIS — E1140 Type 2 diabetes mellitus with diabetic neuropathy, unspecified: Secondary | ICD-10-CM | POA: Diagnosis not present

## 2019-11-20 DIAGNOSIS — M79671 Pain in right foot: Secondary | ICD-10-CM | POA: Diagnosis not present

## 2019-11-22 ENCOUNTER — Other Ambulatory Visit: Payer: Self-pay

## 2019-11-22 ENCOUNTER — Encounter (HOSPITAL_COMMUNITY): Payer: Self-pay | Admitting: Emergency Medicine

## 2019-11-22 ENCOUNTER — Inpatient Hospital Stay (HOSPITAL_COMMUNITY)
Admission: EM | Admit: 2019-11-22 | Discharge: 2019-12-03 | DRG: 280 | Disposition: A | Discharge status: Home Health | Payer: Medicare Other | Attending: Cardiology | Admitting: Cardiology

## 2019-11-22 ENCOUNTER — Emergency Department (HOSPITAL_COMMUNITY): Payer: Medicare Other

## 2019-11-22 DIAGNOSIS — M1990 Unspecified osteoarthritis, unspecified site: Secondary | ICD-10-CM | POA: Diagnosis present

## 2019-11-22 DIAGNOSIS — E782 Mixed hyperlipidemia: Secondary | ICD-10-CM | POA: Diagnosis not present

## 2019-11-22 DIAGNOSIS — J9601 Acute respiratory failure with hypoxia: Secondary | ICD-10-CM | POA: Diagnosis not present

## 2019-11-22 DIAGNOSIS — E872 Acidosis: Secondary | ICD-10-CM | POA: Diagnosis not present

## 2019-11-22 DIAGNOSIS — J9602 Acute respiratory failure with hypercapnia: Secondary | ICD-10-CM | POA: Diagnosis not present

## 2019-11-22 DIAGNOSIS — E1122 Type 2 diabetes mellitus with diabetic chronic kidney disease: Secondary | ICD-10-CM | POA: Diagnosis present

## 2019-11-22 DIAGNOSIS — I714 Abdominal aortic aneurysm, without rupture: Secondary | ICD-10-CM | POA: Diagnosis present

## 2019-11-22 DIAGNOSIS — Z8249 Family history of ischemic heart disease and other diseases of the circulatory system: Secondary | ICD-10-CM | POA: Diagnosis not present

## 2019-11-22 DIAGNOSIS — Z96653 Presence of artificial knee joint, bilateral: Secondary | ICD-10-CM | POA: Diagnosis present

## 2019-11-22 DIAGNOSIS — E119 Type 2 diabetes mellitus without complications: Secondary | ICD-10-CM | POA: Diagnosis not present

## 2019-11-22 DIAGNOSIS — I509 Heart failure, unspecified: Secondary | ICD-10-CM

## 2019-11-22 DIAGNOSIS — I5043 Acute on chronic combined systolic (congestive) and diastolic (congestive) heart failure: Secondary | ICD-10-CM | POA: Diagnosis present

## 2019-11-22 DIAGNOSIS — I493 Ventricular premature depolarization: Secondary | ICD-10-CM | POA: Diagnosis not present

## 2019-11-22 DIAGNOSIS — I110 Hypertensive heart disease with heart failure: Secondary | ICD-10-CM | POA: Diagnosis not present

## 2019-11-22 DIAGNOSIS — Z20822 Contact with and (suspected) exposure to covid-19: Secondary | ICD-10-CM | POA: Diagnosis present

## 2019-11-22 DIAGNOSIS — I130 Hypertensive heart and chronic kidney disease with heart failure and stage 1 through stage 4 chronic kidney disease, or unspecified chronic kidney disease: Secondary | ICD-10-CM | POA: Diagnosis present

## 2019-11-22 DIAGNOSIS — I214 Non-ST elevation (NSTEMI) myocardial infarction: Secondary | ICD-10-CM | POA: Diagnosis not present

## 2019-11-22 DIAGNOSIS — I5082 Biventricular heart failure: Secondary | ICD-10-CM | POA: Diagnosis present

## 2019-11-22 DIAGNOSIS — E785 Hyperlipidemia, unspecified: Secondary | ICD-10-CM | POA: Diagnosis present

## 2019-11-22 DIAGNOSIS — R0989 Other specified symptoms and signs involving the circulatory and respiratory systems: Secondary | ICD-10-CM | POA: Diagnosis not present

## 2019-11-22 DIAGNOSIS — Z79899 Other long term (current) drug therapy: Secondary | ICD-10-CM | POA: Diagnosis not present

## 2019-11-22 DIAGNOSIS — Z8546 Personal history of malignant neoplasm of prostate: Secondary | ICD-10-CM | POA: Diagnosis not present

## 2019-11-22 DIAGNOSIS — R0603 Acute respiratory distress: Secondary | ICD-10-CM

## 2019-11-22 DIAGNOSIS — Z7982 Long term (current) use of aspirin: Secondary | ICD-10-CM | POA: Diagnosis not present

## 2019-11-22 DIAGNOSIS — I10 Essential (primary) hypertension: Secondary | ICD-10-CM | POA: Diagnosis not present

## 2019-11-22 DIAGNOSIS — I2510 Atherosclerotic heart disease of native coronary artery without angina pectoris: Secondary | ICD-10-CM | POA: Diagnosis present

## 2019-11-22 DIAGNOSIS — I5021 Acute systolic (congestive) heart failure: Secondary | ICD-10-CM | POA: Diagnosis not present

## 2019-11-22 DIAGNOSIS — Z87891 Personal history of nicotine dependence: Secondary | ICD-10-CM | POA: Diagnosis not present

## 2019-11-22 DIAGNOSIS — Z87442 Personal history of urinary calculi: Secondary | ICD-10-CM

## 2019-11-22 DIAGNOSIS — N184 Chronic kidney disease, stage 4 (severe): Secondary | ICD-10-CM | POA: Diagnosis present

## 2019-11-22 DIAGNOSIS — Z6835 Body mass index (BMI) 35.0-35.9, adult: Secondary | ICD-10-CM

## 2019-11-22 DIAGNOSIS — K921 Melena: Secondary | ICD-10-CM | POA: Diagnosis present

## 2019-11-22 DIAGNOSIS — D649 Anemia, unspecified: Secondary | ICD-10-CM | POA: Diagnosis not present

## 2019-11-22 DIAGNOSIS — I25810 Atherosclerosis of coronary artery bypass graft(s) without angina pectoris: Secondary | ICD-10-CM | POA: Diagnosis present

## 2019-11-22 DIAGNOSIS — R001 Bradycardia, unspecified: Secondary | ICD-10-CM | POA: Diagnosis not present

## 2019-11-22 DIAGNOSIS — N1831 Chronic kidney disease, stage 3a: Secondary | ICD-10-CM | POA: Diagnosis not present

## 2019-11-22 DIAGNOSIS — Z7984 Long term (current) use of oral hypoglycemic drugs: Secondary | ICD-10-CM

## 2019-11-22 DIAGNOSIS — Z96643 Presence of artificial hip joint, bilateral: Secondary | ICD-10-CM | POA: Diagnosis present

## 2019-11-22 DIAGNOSIS — J449 Chronic obstructive pulmonary disease, unspecified: Secondary | ICD-10-CM | POA: Diagnosis present

## 2019-11-22 DIAGNOSIS — Z823 Family history of stroke: Secondary | ICD-10-CM

## 2019-11-22 DIAGNOSIS — G4733 Obstructive sleep apnea (adult) (pediatric): Secondary | ICD-10-CM | POA: Diagnosis present

## 2019-11-22 DIAGNOSIS — R9439 Abnormal result of other cardiovascular function study: Secondary | ICD-10-CM | POA: Diagnosis present

## 2019-11-22 DIAGNOSIS — I2729 Other secondary pulmonary hypertension: Secondary | ICD-10-CM | POA: Diagnosis present

## 2019-11-22 DIAGNOSIS — N183 Chronic kidney disease, stage 3 unspecified: Secondary | ICD-10-CM | POA: Diagnosis not present

## 2019-11-22 DIAGNOSIS — R0602 Shortness of breath: Secondary | ICD-10-CM | POA: Diagnosis not present

## 2019-11-22 DIAGNOSIS — Z45018 Encounter for adjustment and management of other part of cardiac pacemaker: Secondary | ICD-10-CM

## 2019-11-22 DIAGNOSIS — N179 Acute kidney failure, unspecified: Secondary | ICD-10-CM | POA: Diagnosis present

## 2019-11-22 DIAGNOSIS — E669 Obesity, unspecified: Secondary | ICD-10-CM | POA: Diagnosis present

## 2019-11-22 DIAGNOSIS — Z950 Presence of cardiac pacemaker: Secondary | ICD-10-CM

## 2019-11-22 DIAGNOSIS — D631 Anemia in chronic kidney disease: Secondary | ICD-10-CM | POA: Diagnosis present

## 2019-11-22 DIAGNOSIS — Z951 Presence of aortocoronary bypass graft: Secondary | ICD-10-CM

## 2019-11-22 DIAGNOSIS — I495 Sick sinus syndrome: Secondary | ICD-10-CM | POA: Diagnosis present

## 2019-11-22 LAB — BASIC METABOLIC PANEL
Anion gap: 9 (ref 5–15)
BUN: 55 mg/dL — ABNORMAL HIGH (ref 8–23)
CO2: 22 mmol/L (ref 22–32)
Calcium: 9.2 mg/dL (ref 8.9–10.3)
Chloride: 110 mmol/L (ref 98–111)
Creatinine, Ser: 2.3 mg/dL — ABNORMAL HIGH (ref 0.61–1.24)
GFR calc Af Amer: 30 mL/min — ABNORMAL LOW (ref >60)
GFR calc non Af Amer: 26 mL/min — ABNORMAL LOW (ref >60)
Glucose, Bld: 137 mg/dL — ABNORMAL HIGH (ref 70–99)
Potassium: 4.1 mmol/L (ref 3.5–5.1)
Sodium: 141 mmol/L (ref 135–145)

## 2019-11-22 LAB — POC OCCULT BLOOD, ED: Fecal Occult Bld: NEGATIVE

## 2019-11-22 LAB — CBC WITH DIFFERENTIAL/PLATELET
Abs Immature Granulocytes: 0.01 10*3/uL (ref 0.00–0.07)
Basophils Absolute: 0 10*3/uL (ref 0.0–0.1)
Basophils Relative: 1 %
Eosinophils Absolute: 0.1 10*3/uL (ref 0.0–0.5)
Eosinophils Relative: 1 %
HCT: 37.3 % — ABNORMAL LOW (ref 39.0–52.0)
Hemoglobin: 11.2 g/dL — ABNORMAL LOW (ref 13.0–17.0)
Immature Granulocytes: 0 %
Lymphocytes Relative: 11 %
Lymphs Abs: 0.5 10*3/uL — ABNORMAL LOW (ref 0.7–4.0)
MCH: 28.7 pg (ref 26.0–34.0)
MCHC: 30 g/dL (ref 30.0–36.0)
MCV: 95.6 fL (ref 80.0–100.0)
Monocytes Absolute: 0.3 10*3/uL (ref 0.1–1.0)
Monocytes Relative: 8 %
Neutro Abs: 3.3 10*3/uL (ref 1.7–7.7)
Neutrophils Relative %: 79 %
Platelets: 121 10*3/uL — ABNORMAL LOW (ref 150–400)
RBC: 3.9 MIL/uL — ABNORMAL LOW (ref 4.22–5.81)
RDW: 15.5 % (ref 11.5–15.5)
WBC: 4.2 10*3/uL (ref 4.0–10.5)
nRBC: 0 % (ref 0.0–0.2)

## 2019-11-22 LAB — TYPE AND SCREEN
ABO/RH(D): O POS
Antibody Screen: NEGATIVE

## 2019-11-22 LAB — RESPIRATORY PANEL BY RT PCR (FLU A&B, COVID)
Influenza A by PCR: NEGATIVE
Influenza B by PCR: NEGATIVE
SARS Coronavirus 2 by RT PCR: NEGATIVE

## 2019-11-22 LAB — TROPONIN I (HIGH SENSITIVITY)
Troponin I (High Sensitivity): 565 ng/L (ref <18)
Troponin I (High Sensitivity): 506 ng/L (ref <18)

## 2019-11-22 LAB — APTT: aPTT: 33 seconds (ref 24–36)

## 2019-11-22 LAB — PROTIME-INR
INR: 1.2 (ref 0.8–1.2)
Prothrombin Time: 14.8 seconds (ref 11.4–15.2)

## 2019-11-22 LAB — BRAIN NATRIURETIC PEPTIDE: B Natriuretic Peptide: 1194 pg/mL — ABNORMAL HIGH (ref 0.0–100.0)

## 2019-11-22 MED ORDER — HEPARIN BOLUS VIA INFUSION
4000.0000 [IU] | Freq: Once | INTRAVENOUS | Status: AC
  Administered 2019-11-22: 4000 [IU] via INTRAVENOUS

## 2019-11-22 MED ORDER — ASPIRIN 81 MG PO CHEW
324.0000 mg | CHEWABLE_TABLET | Freq: Once | ORAL | Status: AC
  Administered 2019-11-22: 324 mg via ORAL
  Filled 2019-11-22: qty 4

## 2019-11-22 MED ORDER — HEPARIN (PORCINE) 25000 UT/250ML-% IV SOLN
1250.0000 [IU]/h | INTRAVENOUS | Status: DC
  Administered 2019-11-22: 21:00:00 1000 [IU]/h via INTRAVENOUS
  Administered 2019-11-23 – 2019-11-27 (×6): 1250 [IU]/h via INTRAVENOUS
  Filled 2019-11-22 (×7): qty 250

## 2019-11-22 MED ORDER — FUROSEMIDE 10 MG/ML IJ SOLN
40.0000 mg | Freq: Once | INTRAMUSCULAR | Status: AC
  Administered 2019-11-22: 40 mg via INTRAVENOUS
  Filled 2019-11-22: qty 4

## 2019-11-22 NOTE — ED Provider Notes (Signed)
[facility] EMERGENCY DEPARTMENT Provider Note   CSN: [id number] Arrival date & time: 11/22/19  1516     History Chief Complaint  Patient presents with  . Hematochezia    [name] is a 79 y.o. adult.  HPI   Patient presents to the ED with complaints of black starry tools and fatigue.  Patient states the symptoms started a few days ago.  He has been feeling very fatigued.  He also has been having some shortness of breath and some increased swelling of his legs.  He denies any chest pain or fevers.  No abdominal pain.  He has noticed very dark tarry stools for the last couple of days.  Patient has a history of bleeding ulcers and had similar symptoms when that occurred. Patient has been fully vaccinated against Covid Past Medical History:  Diagnosis Date  . AAA (abdominal aortic aneurysm) ([place])   . COPD (chronic obstructive pulmonary disease) ([place])   . Coronary artery disease 2000   Status post CABG  . Diabetes mellitus    DM2  . Dyslipidemia   . Dyspnea   . History of kidney stones   . Hypertension   . Obesity   . Osteoarthritis   . Pacemaker   . Prostate cancer ([place])   . Sleep apnea    USING CPAP  . Spinal stenosis   . SSS (sick sinus syndrome) ([place]) 2000   s/p PPM    Patient Active Problem List   Diagnosis Date Noted  . Gastric ulcer 02/11/2019  . Acute GI bleeding 10/23/2018  . Hypotension 10/23/2018  . AAA (abdominal aortic aneurysm) ([place]) 10/23/2018  . Sleep apnea 10/23/2018  . Type 2 Diabetes mellitus 10/23/2018  . COPD (chronic obstructive pulmonary disease) ([place]) 10/23/2018  . Pacemaker 10/23/2018  . Passage of bloody stools 10/23/2018  . CKD (chronic kidney disease), stage III 10/23/2018  . Acute renal failure superimposed on stage 3 chronic kidney disease ([place]) 10/23/2018  . UTI (urinary tract infection) 10/23/2018  . Sepsis ([place]) 10/23/2018  . Elevated lactic acid level 10/23/2018  . Pressure injury of skin 10/23/2018  . Hyperkalemia  10/23/2018  . Malignant neoplasm of prostate ([place]) 03/13/2018  . OA (osteoarthritis) of hip 07/18/2017  . 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 Right foot drop 11/18/2015  . Hx of decompressive lumbar laminectomy 11/18/2015  . Diabetic polyneuropathy associated with diabetes mellitus due to underlying condition ([place]) 11/18/2015  . Morbid obesity ([place]) 08/06/2015  . ESSENTIAL HYPERTENSION, BENIGN 10/22/2009  . Coronary atherosclerosis 10/22/2009  . SSS (sick sinus syndrome) ([place]) 10/22/2009    Past Surgical History:  Procedure Laterality Date  . BIOPSY PROSTATE  01/09/2018  . CATARACT EXTRACTION Left   . CORONARY ARTERY BYPASS GRAFT     2000  . ESOPHAGOGASTRODUODENOSCOPY N/A 10/24/2018   Procedure: ESOPHAGOGASTRODUODENOSCOPY (EGD);  Surgeon: Rogene Houston, MD;  Location: [facility] ENDO SUITE;  Service: Endoscopy;  Laterality: N/A;  . ESOPHAGOGASTRODUODENOSCOPY N/A 03/19/2019   Procedure: ESOPHAGOGASTRODUODENOSCOPY (EGD);  Surgeon: Rogene Houston, MD;  Location: [facility] ENDO SUITE;  Service: Endoscopy;  Laterality: N/A;  255  . JOINT REPLACEMENT     L hip Dr. Wynelle Link 07-18-17  . L5-S1 Gill decompressive laminectomy    . PACEMAKER INSERTION     MDT implanted for sick sinus syndrome  . PERMANENT PACEMAKER GENERATOR CHANGE N/A 03/30/2014   MDT Adapta L generator change by Dr Rayann Heman  . RADIOACTIVE SEED IMPLANT N/A 06/10/2018   Procedure: RADIOACTIVE SEED IMPLANT/BRACHYTHERAPY IMPLANT;  Surgeon:  McKenzie, Candee Furbish, MD;  Location: [facility] ORS;  Service: Urology;  Laterality: N/A;  . REPLACEMENT TOTAL KNEE BILATERAL     BIL  . Right total hip arthroplasty.    . SPACE OAR INSTILLATION N/A 06/10/2018   Procedure: SPACE OAR INSTILLATION;  Surgeon: Cleon Gustin, MD;  Location: [facility] ORS;  Service: Urology;  Laterality: N/A;  . TOTAL HIP ARTHROPLASTY Left 07/18/2017   Procedure: LEFT TOTAL HIP ARTHROPLASTY ANTERIOR APPROACH;  Surgeon: [name], MD;  Location: [facility] ORS;  Service:  Orthopedics;  Laterality: Left;  . TRANSRECTAL ULTRASOUND  01/09/2018     OB History   No obstetric history on file.     Family History  Problem Relation Age of Onset  . CAD Father   . Stroke Father        Deceased, 63  . Hypertension Mother   . Stroke Mother        Deceased, 8  . Heart disease Mother   . Stroke Brother        Deceased, 60  . Hypertension Sister        Deceased, 63  . Heart disease Sister   . Healthy Son   . Breast cancer Daughter   . Prostate cancer Neg Hx   . Colon cancer Neg Hx   . Pancreatic cancer Neg Hx     Social History   Tobacco Use  . Smoking status: Former Smoker    Packs/day: 1.00    Years: 42.00    Pack years: 42.00    Types: Cigarettes    Start date: 01/28/1961    Quit date: 10/30/1998    Years since quitting: 21.0  . Smokeless tobacco: Never Used  Substance Use Topics  . Alcohol use: No    Alcohol/week: 0.0 standard drinks  . Drug use: No    Home Medications Prior to Admission medications   Medication Sig Start Date End Date Taking? Authorizing Provider  acetaminophen (TYLENOL) 500 MG tablet Take 1,000 mg by mouth every 6 (six) hours as needed for moderate pain or headache.    [provider]  alfuzosin (UROXATRAL) 10 MG 24 hr tablet TAKE 1 TABLET BY MOUTH EVERY DAY 10/22/19   McKenzie, Candee Furbish, MD  aspirin EC 81 MG tablet Take 1 tablet (81 mg total) by mouth daily. Restart in 2 weeks Patient taking differently: Take 81 mg by mouth daily.  10/27/18   Kathie Dike, MD  atorvastatin (LIPITOR) 40 MG tablet Take 40 mg by mouth at bedtime.     [provider]  glimepiride (AMARYL) 4 MG tablet Take 2 mg by mouth daily.     [provider]  metoprolol succinate (TOPROL-XL) 50 MG 24 hr tablet Take 50 mg by mouth daily.  06/07/17   [provider]  Multiple Vitamins-Minerals (MULTIVITAMIN ADULTS PO) Take 1 tablet by mouth daily.     [provider]  nitroGLYCERIN (NITROSTAT) 0.4 MG SL tablet  Place 0.4 mg under the tongue every 5 (five) minutes as needed for chest pain.    [provider]  pantoprazole (PROTONIX) 40 MG tablet TAKE 1 TABLET BY MOUTH EVERY DAY BEFORE BREAKFAST 11/18/19   Rehman, Mechele Dawley, MD  pioglitazone (ACTOS) 30 MG tablet Take 15 mg by mouth every evening.     [provider]  tamsulosin (FLOMAX) 0.4 MG CAPS capsule Take 0.4 mg by mouth daily.    [provider]  traMADol (ULTRAM) 50 MG tablet Take 50 mg by mouth daily.  [provider]    Allergies    Patient has no known allergies.  Review of Systems   Review of Systems  All other systems reviewed and are negative.   Physical Exam Updated Vital Signs BP (!) 111/92 (BP Location: Right Arm)   Pulse 78   Temp 97.7 F (36.5 C) (Oral)   Resp 16   Ht 1.702 m (5\' 7" )   Wt 102.1 kg   SpO2 97%   BMI 35.24 kg/m   Physical Exam Vitals and nursing note reviewed.  Constitutional:      General: She is not in acute distress.    Appearance: She is well-developed.  HENT:     Head: Normocephalic and atraumatic.     Right Ear: External ear normal.     Left Ear: External ear normal.  Eyes:     General: No scleral icterus.       Right eye: No discharge.        Left eye: No discharge.     Conjunctiva/sclera: Conjunctivae normal.  Neck:     Trachea: No tracheal deviation.  Cardiovascular:     Rate and Rhythm: Normal rate and regular rhythm.  Pulmonary:     Effort: Pulmonary effort is normal. No respiratory distress.     Breath sounds: Normal breath sounds. No stridor. No wheezing or rales.  Abdominal:     General: Bowel sounds are normal. There is no distension.     Palpations: Abdomen is soft. There is no mass.     Tenderness: There is no abdominal tenderness. There is no guarding or rebound.     Comments: Abdomen protuberant  Musculoskeletal:        General: No tenderness.     Cervical back: Neck supple.     Right lower leg: Edema present.     Left lower leg:  Edema present.  Skin:    General: Skin is warm and dry.     Findings: No rash.  Neurological:     Mental Status: She is alert.     Cranial Nerves: No cranial nerve deficit (no facial droop, extraocular movements intact, no slurred speech).     Sensory: No sensory deficit.     Motor: No abnormal muscle tone or seizure activity.     Coordination: Coordination normal.     ED Results / Procedures / Treatments   Labs (all labs ordered are listed, but only abnormal results are displayed) Labs Reviewed  BRAIN NATRIURETIC PEPTIDE - Abnormal; Notable for the following components:      Result Value   B Natriuretic Peptide 1,194.0 (*)    All other components within normal limits  BASIC METABOLIC PANEL - Abnormal; Notable for the following components:   Glucose, Bld 137 (*)    BUN 55 (*)    Creatinine, Ser 2.30 (*)    GFR calc non Af Amer 26 (*)    GFR calc Af Amer 30 (*)    All other components within normal limits  CBC WITH DIFFERENTIAL/PLATELET - Abnormal; Notable for the following components:   RBC 3.90 (*)    Hemoglobin 11.2 (*)    HCT 37.3 (*)    Platelets 121 (*)    Lymphs Abs 0.5 (*)    All other components within normal limits  TROPONIN I (HIGH SENSITIVITY) - Abnormal; Notable for the following components:   Troponin I (High Sensitivity) 565 (*)    All other components within normal limits  RESPIRATORY PANEL BY RT PCR (FLU  A&B, COVID)  CBC WITH DIFFERENTIAL/PLATELET  POC OCCULT BLOOD, ED  TYPE AND SCREEN    EKG EKG Interpretation  Date/Time:  Saturday November 22 2019 18:15:48 EDT Ventricular Rate:  78 PR Interval:    QRS Duration: 97 QT Interval:  366 QTC Calculation: 417 R Axis:   51 Text Interpretation: Sinus rhythm Atrial premature complexes Repol abnrm, severe global ischemia (LM/MVD) , new since last tracing Confirmed by Dorie Rank [phone]) on 11/22/2019 6:20:51 PM   Radiology DG Chest Portable 1 View  Result Date: 11/22/2019 CLINICAL DATA:  Shortness of  breath EXAM: PORTABLE CHEST 1 VIEW COMPARISON:  10/23/2018 FINDINGS: Single frontal view of the chest demonstrates stable postsurgical changes from spinal fusion and CABG. Dual lead pacemaker unchanged. Cardiac silhouette is enlarged but stable. There is mild increased vascular congestion. Trace bilateral pleural effusions versus pleural thickening are noted, stable. No airspace disease or pneumothorax. IMPRESSION: 1. Increased vascular congestion without overt edema. 2. Chronic trace bilateral pleural effusions versus pleural thickening. Electronically Signed   By: Randa Ngo M.D.   On: 11/22/2019 18:42    Procedures .Critical Care Performed by: Dorie Rank, MD Authorized by: Dorie Rank, MD   Critical care provider statement:    Critical care time (minutes):  45   Critical care was time spent personally by me on the following activities:  Discussions with consultants, evaluation of patient's response to treatment, examination of patient, ordering and performing treatments and interventions, ordering and review of laboratory studies, ordering and review of radiographic studies, pulse oximetry, re-evaluation of patient's condition, obtaining history from patient or surrogate and review of old charts   (including critical care time)  Medications Ordered in ED Medications  aspirin chewable tablet 324 mg (has no administration in time range)  furosemide (LASIX) injection 40 mg (has no administration in time range)    ED Course  I have reviewed the triage vital signs and the nursing notes.  Pertinent labs & imaging results that were available during my care of the patient were reviewed by me and considered in my medical decision making (see chart for details).  Clinical Course as of Nov 22 1947  Sat Nov 22, 2019  1821 EKG reviewed. Ischemic changes new since last tracing. Patient denies any pain. We will add on troponin   [JK]  1932 Labs notable for stable hemoglobin.  BNP is elevated at  1194 patient's troponin also significantly elevated at 565.  BUN and creatinine are up at 55 and 2.3   [JK]  1933 Chest x-ray suggest pulmonary vascular congestion   [JK]  1945 D/w cardiology, Dr Blossom Hoops.  Will admit to [place].   Heparin infusion ordered    [JK]    Clinical Course User Index [JK] Dorie Rank, MD   MDM Rules/Calculators/A&P                      Patient presents ED with complaints of fatigue.  He was concerned that he had another GI bleed.  ED work-up does not suggest GI bleed.  He is guaiac negative and his hemoglobin is stable.  Patient however does appear to have a CHF exacerbation.  His pedal edema and vascular congestion noted on x-ray.  However patient also may have had a non-ST elevation MI.  He has ischemic changes on his EKG and a significantly elevated troponin.  Patient however denies having any chest pain.  I have ordered aspirin and Lasix.  I will consult with the cardiology  service   Final Clinical Impression(s) / ED Diagnoses Final diagnoses:  NSTEMI (non-ST elevated myocardial infarction) ([place])  Acute congestive heart failure, unspecified heart failure type [facility])      Dorie Rank, MD 11/22/19 1949

## 2019-11-22 NOTE — ED Triage Notes (Signed)
Pt complains of black tarry stools and fatigue for the past couple of days. Pt has a history of bleeding ulcers.

## 2019-11-22 NOTE — ED Notes (Signed)
CRITICAL VALUE ALERT  Critical Value: Troponin 565 Date & Time Notied: 11/22/19 @ 1906 Provider Notified: Dr Hillard Danker Orders Received/Actions taken:none yet

## 2019-11-22 NOTE — Progress Notes (Signed)
ANTICOAGULATION CONSULT NOTE - Initial Consult  Pharmacy Consult for heparin dosing Indication: ACS/STEMI  No Known Allergies  Patient Measurements: Height: 5\' 7"  (170.2 cm) Weight: 102.1 kg (225 lb) IBW/kg (Calculated) : 66.1 Heparin Dosing Weight: HEPARIN DW (KG): 88.5  Vital Signs: Temp: 97.7 F (36.5 C) (04/24 1528) Temp Source: Oral (04/24 1528) BP: 111/74 (04/24 1930) Pulse Rate: 82 (04/24 1930)  Labs: Recent Labs    11/22/19 1812  HGB 11.2*  HCT 37.3*  PLT 121*  CREATININE 2.30*  TROPONINIHS 565*    Estimated Creatinine Clearance (by C-G formula based on SCr of 2.3 mg/dL (H)) Male: 24.8 mL/min (A) Male: 30.1 mL/min (A)   Medical History: Past Medical History:  Diagnosis Date  . AAA (abdominal aortic aneurysm) ([place])   . COPD (chronic obstructive pulmonary disease) ([place])   . Coronary artery disease 2000   Status post CABG  . Diabetes mellitus    DM2  . Dyslipidemia   . Dyspnea   . History of kidney stones   . Hypertension   . Obesity   . Osteoarthritis   . Pacemaker   . Prostate cancer ([place])   . Sleep apnea    USING CPAP  . Spinal stenosis   . SSS (sick sinus syndrome) ([place]) 2000   s/p PPM      Assessment: Pharmacy consulted to dose heparin infusion for this 3 yom with fatigue and elevated troponins. Patient has had several black, tarry stools over the past few days, but stool is guaiac negative and no signs of GI bleed noted.  Baseline Hb is 11.2mg /dL and platelet count is 121K. EKG  shows ischemic changes and possible NSTEMI.  Patient wasn't taking any anti-coagulant medications prior to admission.    Goal of Therapy:  Heparin level 0.3-0.7 units/ml Monitor platelets by anticoagulation protocol: Yes   Plan:  Give 4000 units bolus x 1 Start heparin infusion at 1000 units/hr Check anti-Xa level in 6-8 hours and daily while on heparin Continue to monitor H&H and platelets  Despina Pole 11/22/2019,8:20 PM

## 2019-11-23 LAB — HEPARIN LEVEL (UNFRACTIONATED)
Heparin Unfractionated: 0.19 IU/mL — ABNORMAL LOW (ref 0.30–0.70)
Heparin Unfractionated: 0.54 IU/mL (ref 0.30–0.70)
Heparin Unfractionated: 0.45 IU/mL (ref 0.30–0.70)

## 2019-11-23 MED ORDER — HEPARIN BOLUS VIA INFUSION
2000.0000 [IU] | Freq: Once | INTRAVENOUS | Status: AC
  Administered 2019-11-23: 2000 [IU] via INTRAVENOUS

## 2019-11-23 NOTE — Progress Notes (Signed)
[place] for heparin Indication: ACS/STEMI  No Known Allergies  Patient Measurements: Height: 5\' 7"  (170.2 cm) Weight: 102.1 kg (225 lb) IBW/kg (Calculated) : 66.1 Heparin Dosing Weight: HEPARIN DW (KG): 88.5  Vital Signs: BP: 139/92 (04/25 2130) Pulse Rate: 70 (04/25 1830)  Labs: Recent Labs    11/22/19 1812 11/22/19 2027 11/23/19 0501 11/23/19 1433 11/23/19 2210  HGB 11.2*  --   --   --   --   HCT 37.3*  --   --   --   --   PLT 121*  --   --   --   --   APTT  --  33  --   --   --   LABPROT  --  14.8  --   --   --   INR  --  1.2  --   --   --   HEPARINUNFRC  --   --  0.19* 0.54 0.45  CREATININE 2.30*  --   --   --   --   TROPONINIHS 565* 506*  --   --   --     Estimated Creatinine Clearance (by C-G formula based on SCr of 2.3 mg/dL (H)) Male: 24.8 mL/min (A) Male: 30.1 mL/min (A)  Assessment: 79 y.o. adult with elevated cardiac markers for heparin  Goal of Therapy:  Heparin level 0.3-0.7 units/ml Monitor platelets by anticoagulation protocol: Yes   Plan:  Continue Heparin at current rate   Caryl Pina 11/23/2019,11:16 PM

## 2019-11-23 NOTE — Progress Notes (Signed)
ANTICOAGULATION CONSULT NOTE - Initial Consult  Pharmacy Consult for heparin dosing Indication: ACS/STEMI  No Known Allergies  Patient Measurements: Height: 5\' 7"  (170.2 cm) Weight: 102.1 kg (225 lb) IBW/kg (Calculated) : 66.1 Heparin Dosing Weight: HEPARIN DW (KG): 88.5  Vital Signs: BP: 110/98 (04/25 1430) Pulse Rate: 71 (04/25 1430)  Labs: Recent Labs    11/22/19 1812 11/22/19 2027 11/23/19 0501 11/23/19 1433  HGB 11.2*  --   --   --   HCT 37.3*  --   --   --   PLT 121*  --   --   --   APTT  --  33  --   --   LABPROT  --  14.8  --   --   INR  --  1.2  --   --   HEPARINUNFRC  --   --  0.19* 0.54  CREATININE 2.30*  --   --   --   TROPONINIHS 565* 506*  --   --     Estimated Creatinine Clearance (by C-G formula based on SCr of 2.3 mg/dL (H)) Male: 24.8 mL/min (A) Male: 30.1 mL/min (A)    Assessment: Pharmacy consulted to dose heparin infusion for this 55 yom with fatigue and elevated troponins. Patient has had several black, tarry stools over the past few days, but stool is guaiac negative and no signs of GI bleed noted.  Baseline Hb is 11.2mg /dL and platelet count is 121K. EKG  shows ischemic changes and possible NSTEMI.  Patient wasn't taking any anti-coagulant medications prior to admission.   11/23/19 1545 update: HL: 0.54 IU/mL--> within therapeutic goal range on heparin at 1250 units/hr CBC: ordered RN reports no bleeding complications or issues with infusion site.  Goal of Therapy:  Heparin level 0.3-0.7 units/ml Monitor platelets by anticoagulation protocol: Yes   Plan:   Continue heparin infusion at 1250 units/hr Re-check anti-Xa level in ~8 hours  Continue to monitor CBC and s/s of bleeding  Despina Pole 11/23/2019,3:44 PM

## 2019-11-23 NOTE — Progress Notes (Signed)
[place] for heparin Indication: ACS/STEMI  No Known Allergies  Patient Measurements: Height: 5\' 7"  (170.2 cm) Weight: 102.1 kg (225 lb) IBW/kg (Calculated) : 66.1 Heparin Dosing Weight: HEPARIN DW (KG): 88.5  Vital Signs: BP: 126/74 (04/25 0600) Pulse Rate: 68 (04/25 0600)  Labs: Recent Labs    11/22/19 1812 11/22/19 2027 11/23/19 0501  HGB 11.2*  --   --   HCT 37.3*  --   --   PLT 121*  --   --   APTT  --  33  --   LABPROT  --  14.8  --   INR  --  1.2  --   HEPARINUNFRC  --   --  0.19*  CREATININE 2.30*  --   --   TROPONINIHS 565* 506*  --     Estimated Creatinine Clearance (by C-G formula based on SCr of 2.3 mg/dL (H)) Male: 24.8 mL/min (A) Male: 30.1 mL/min (A)  Assessment: 79 y.o. adult with elevated cardiac markers for heparin  Goal of Therapy:  Heparin level 0.3-0.7 units/ml Monitor platelets by anticoagulation protocol: Yes   Plan:  Heparin 2000 units IV bolus, then increase heparin  1250 units/hr Check heparin level in 6 hours.   Caryl Pina 11/23/2019,6:40 AM

## 2019-11-24 LAB — CBC
HCT: 34.9 % — ABNORMAL LOW (ref 39.0–52.0)
Hemoglobin: 10.3 g/dL — ABNORMAL LOW (ref 13.0–17.0)
MCH: 28.4 pg (ref 26.0–34.0)
MCHC: 29.5 g/dL — ABNORMAL LOW (ref 30.0–36.0)
MCV: 96.1 fL (ref 80.0–100.0)
Platelets: 112 10*3/uL — ABNORMAL LOW (ref 150–400)
RBC: 3.63 MIL/uL — ABNORMAL LOW (ref 4.22–5.81)
RDW: 15.1 % (ref 11.5–15.5)
WBC: 4.8 10*3/uL (ref 4.0–10.5)
nRBC: 0 % (ref 0.0–0.2)

## 2019-11-24 LAB — HEPARIN LEVEL (UNFRACTIONATED): Heparin Unfractionated: 0.34 IU/mL (ref 0.30–0.70)

## 2019-11-24 NOTE — ED Notes (Signed)
Up to bedside commode

## 2019-11-25 ENCOUNTER — Inpatient Hospital Stay (HOSPITAL_COMMUNITY): Payer: Medicare Other

## 2019-11-25 DIAGNOSIS — R001 Bradycardia, unspecified: Secondary | ICD-10-CM | POA: Diagnosis not present

## 2019-11-25 DIAGNOSIS — I10 Essential (primary) hypertension: Secondary | ICD-10-CM | POA: Diagnosis not present

## 2019-11-25 DIAGNOSIS — N183 Chronic kidney disease, stage 3 unspecified: Secondary | ICD-10-CM

## 2019-11-25 DIAGNOSIS — I509 Heart failure, unspecified: Secondary | ICD-10-CM | POA: Diagnosis not present

## 2019-11-25 DIAGNOSIS — E119 Type 2 diabetes mellitus without complications: Secondary | ICD-10-CM

## 2019-11-25 DIAGNOSIS — D649 Anemia, unspecified: Secondary | ICD-10-CM

## 2019-11-25 DIAGNOSIS — I214 Non-ST elevation (NSTEMI) myocardial infarction: Secondary | ICD-10-CM | POA: Diagnosis not present

## 2019-11-25 DIAGNOSIS — E785 Hyperlipidemia, unspecified: Secondary | ICD-10-CM

## 2019-11-25 LAB — CBC
HCT: 34.1 % — ABNORMAL LOW (ref 39.0–52.0)
Hemoglobin: 10.1 g/dL — ABNORMAL LOW (ref 13.0–17.0)
MCH: 28.6 pg (ref 26.0–34.0)
MCHC: 29.6 g/dL — ABNORMAL LOW (ref 30.0–36.0)
MCV: 96.6 fL (ref 80.0–100.0)
Platelets: 106 10*3/uL — ABNORMAL LOW (ref 150–400)
RBC: 3.53 MIL/uL — ABNORMAL LOW (ref 4.22–5.81)
RDW: 15.2 % (ref 11.5–15.5)
WBC: 5.1 10*3/uL (ref 4.0–10.5)
nRBC: 0 % (ref 0.0–0.2)

## 2019-11-25 LAB — BASIC METABOLIC PANEL
Anion gap: 9 (ref 5–15)
BUN: 51 mg/dL — ABNORMAL HIGH (ref 8–23)
CO2: 22 mmol/L (ref 22–32)
Calcium: 8.9 mg/dL (ref 8.9–10.3)
Chloride: 108 mmol/L (ref 98–111)
Creatinine, Ser: 1.98 mg/dL — ABNORMAL HIGH (ref 0.61–1.24)
GFR calc Af Amer: 36 mL/min — ABNORMAL LOW (ref >60)
GFR calc non Af Amer: 31 mL/min — ABNORMAL LOW (ref >60)
Glucose, Bld: 101 mg/dL — ABNORMAL HIGH (ref 70–99)
Potassium: 4.3 mmol/L (ref 3.5–5.1)
Sodium: 139 mmol/L (ref 135–145)

## 2019-11-25 LAB — CBG MONITORING, ED
Glucose-Capillary: 104 mg/dL — ABNORMAL HIGH (ref 70–99)
Glucose-Capillary: 116 mg/dL — ABNORMAL HIGH (ref 70–99)

## 2019-11-25 LAB — ECHOCARDIOGRAM COMPLETE
Height: 67 in
Weight: 3600 oz

## 2019-11-25 LAB — GLUCOSE, CAPILLARY
Glucose-Capillary: 130 mg/dL — ABNORMAL HIGH (ref 70–99)
Glucose-Capillary: 127 mg/dL — ABNORMAL HIGH (ref 70–99)

## 2019-11-25 LAB — HEPARIN LEVEL (UNFRACTIONATED): Heparin Unfractionated: 0.42 IU/mL (ref 0.30–0.70)

## 2019-11-25 MED ORDER — PANTOPRAZOLE SODIUM 40 MG PO TBEC
40.0000 mg | DELAYED_RELEASE_TABLET | Freq: Every morning | ORAL | Status: DC
  Administered 2019-11-25 – 2019-12-03 (×8): 40 mg via ORAL
  Filled 2019-11-25 (×8): qty 1

## 2019-11-25 MED ORDER — ASPIRIN EC 81 MG PO TBEC
81.0000 mg | DELAYED_RELEASE_TABLET | Freq: Every day | ORAL | Status: DC
  Administered 2019-11-25 – 2019-12-03 (×8): 81 mg via ORAL
  Filled 2019-11-25 (×9): qty 1

## 2019-11-25 MED ORDER — NITROGLYCERIN 0.4 MG SL SUBL: 0.4000 mg | SUBLINGUAL_TABLET | SUBLINGUAL | Status: DC | PRN

## 2019-11-25 MED ORDER — METOPROLOL SUCCINATE ER 50 MG PO TB24
50.0000 mg | ORAL_TABLET | Freq: Every day | ORAL | Status: DC
  Administered 2019-11-25 – 2019-11-27 (×3): 50 mg via ORAL
  Filled 2019-11-25 (×3): qty 1
  Filled 2019-11-25: qty 2

## 2019-11-25 MED ORDER — FUROSEMIDE 10 MG/ML IJ SOLN
40.0000 mg | Freq: Once | INTRAMUSCULAR | Status: AC
  Administered 2019-11-25: 17:00:00 40 mg via INTRAVENOUS
  Filled 2019-11-25: qty 4

## 2019-11-25 MED ORDER — METOPROLOL SUCCINATE ER 25 MG PO TB24: 25.0000 mg | ORAL_TABLET | ORAL | Status: DC

## 2019-11-25 MED ORDER — METOPROLOL SUCCINATE ER 25 MG PO TB24
25.0000 mg | ORAL_TABLET | Freq: Every day | ORAL | Status: DC
  Administered 2019-11-25 – 2019-11-26 (×2): 25 mg via ORAL
  Filled 2019-11-25 (×3): qty 1

## 2019-11-25 MED ORDER — ONDANSETRON HCL 4 MG/2ML IJ SOLN
4.0000 mg | Freq: Four times a day (QID) | INTRAMUSCULAR | Status: DC | PRN
  Filled 2019-11-25: qty 2

## 2019-11-25 MED ORDER — ATORVASTATIN CALCIUM 40 MG PO TABS
40.0000 mg | ORAL_TABLET | Freq: Every day | ORAL | Status: DC
  Administered 2019-11-25 – 2019-12-02 (×7): 40 mg via ORAL
  Filled 2019-11-25 (×8): qty 1

## 2019-11-25 MED ORDER — ACETAMINOPHEN 325 MG PO TABS
650.0000 mg | ORAL_TABLET | ORAL | Status: DC | PRN
  Administered 2019-11-30 – 2019-12-01 (×2): 650 mg via ORAL
  Filled 2019-11-25 (×2): qty 2

## 2019-11-25 MED ORDER — INSULIN ASPART 100 UNIT/ML ~~LOC~~ SOLN
0.0000 [IU] | Freq: Three times a day (TID) | SUBCUTANEOUS | Status: DC
  Administered 2019-11-25 – 2019-11-27 (×4): 2 [IU] via SUBCUTANEOUS
  Administered 2019-11-29 – 2019-11-30 (×3): 3 [IU] via SUBCUTANEOUS
  Administered 2019-11-30: 2 [IU] via SUBCUTANEOUS
  Administered 2019-12-01: 3 [IU] via SUBCUTANEOUS
  Administered 2019-12-01: 2 [IU] via SUBCUTANEOUS
  Administered 2019-12-01: 3 [IU] via SUBCUTANEOUS
  Administered 2019-12-02: 5 [IU] via SUBCUTANEOUS
  Administered 2019-12-02: 2 [IU] via SUBCUTANEOUS
  Administered 2019-12-02: 5 [IU] via SUBCUTANEOUS
  Administered 2019-12-03: 2 [IU] via SUBCUTANEOUS
  Administered 2019-12-03: 5 [IU] via SUBCUTANEOUS

## 2019-11-25 MED ORDER — FUROSEMIDE 10 MG/ML IJ SOLN
40.0000 mg | Freq: Once | INTRAMUSCULAR | Status: AC
  Administered 2019-11-25: 40 mg via INTRAVENOUS
  Filled 2019-11-25: qty 4

## 2019-11-25 NOTE — H&P (Addendum)
History & Physical    Patient ID: [name] MRN: [id number], DOB/AGE: Jun 29, 1941   Admit date: 11/22/2019  Primary Care Provider: Monico Blitz, MD Primary Cardiologist: Thompson Grayer, MD   Chief Complaint: NSTEMI  Patient Profile    [name] is a 79 y.o. adult with past medical history of CAD (s/p CABG in 2000), symptomatic bradycardia (s/p PPM placement), HTN, HLD, Type 2 DM, OSA and prostate cancer who presented to [facility] ED on 11/22/2019 for evaluation of fatigue and tarry stools.  History of Present Illness    [name] reports having worsening dyspnea on exertion for the past 3 to 4 weeks.  Reports now getting short of breath with walking from room to room in his home. He denies any associated chest pain or palpitations. He initially thought his symptoms might be secondary to a GI ulcer as he reports similar symptoms when he had significant anemia in the past. Also reports having dark stools for the past several weeks as well. No recent lightheadedness, dizziness or presyncope. He does report worsening orthopnea and has been sleeping with 3 pillows at night.  He also reports worsening lower extremity edema.  He is not on a diuretic at home and reports his family doctor told him he had an "elevated creatinine" in the past.  He has been followed by Urology given his history of prostate cancer that is not follow-up Nephrology.  Initial labs showed WBC 4.2, Hgb 11.2, platelets 121, Na+ 141, K+ 4.1 and creatinine 2.30. BNP 1194. Negative for COVID. Initial HS Troponin 565 with repeat of 506. Occult blood negative. CXR showed increased vascular congestion without edema and chronic trace pleural effusions. EKG shows NSR, HR 78 with PAC's with slight ST elevation along Lead III and minimal depression along lateral leads.   He has been in the ED since 11/22/2019 due to no bed availability at [facility]. Was admitted under the Cardiology fellow's name but no admit orders were  entered until today. He has been on IV Heparin since admission. Received IV Lasix 40mg  on 4/24 but no subsequent doses since. Repeat labs this AM show Hgb at 10.1 with creatinine improved to 1.98.  Past Medical History:  Diagnosis Date  . AAA (abdominal aortic aneurysm) ([place])   . COPD (chronic obstructive pulmonary disease) ([place])   . Coronary artery disease 2000   Status post CABG  . Diabetes mellitus    DM2  . Dyslipidemia   . Dyspnea   . History of kidney stones   . Hypertension   . Obesity   . Osteoarthritis   . Pacemaker   . Prostate cancer ([place])   . Sleep apnea    USING CPAP  . Spinal stenosis   . SSS (sick sinus syndrome) ([place]) 2000   s/p PPM    Past Surgical History:  Procedure Laterality Date  . BIOPSY PROSTATE  01/09/2018  . CATARACT EXTRACTION Left   . CORONARY ARTERY BYPASS GRAFT     2000  . ESOPHAGOGASTRODUODENOSCOPY N/A 10/24/2018   Procedure: ESOPHAGOGASTRODUODENOSCOPY (EGD);  Surgeon: Rogene Houston, MD;  Location: [facility] END[facility];  Service: Endoscopy;  Laterality: N/A;  . ESOPHAGOGASTRODUODENOSCOPY N/A 03/19/2019   Procedure: ESOPHAGOGASTRODUODENOSCOPY (EGD);  Surgeon: Rogene Houston, MD;  Location: [facility] ENDO SUITE;  Service: Endoscopy;  Laterality: N/A;  255  . JOINT REPLACEMENT     L hip Dr. Wynelle Link 07-18-17  . L5-S1 Gill decompressive laminectomy    . PACEMAKER INSERTION  MDT implanted for sick sinus syndrome  . PERMANENT PACEMAKER GENERATOR CHANGE N/A 03/30/2014   MDT Adapta L generator change by Dr Rayann Heman  . RADIOACTIVE SEED IMPLANT N/A 06/10/2018   Procedure: RADIOACTIVE SEED IMPLANT/BRACHYTHERAPY IMPLANT;  Surgeon: Cleon Gustin, MD;  Location: [facility] ORS;  Service: Urology;  Laterality: N/A;  . REPLACEMENT TOTAL KNEE BILATERAL     BIL  . Right total hip arthroplasty.    . SPACE OAR INSTILLATION N/A 06/10/2018   Procedure: SPACE OAR INSTILLATION;  Surgeon: Cleon Gustin, MD;  Location: [facility] ORS;  Service: Urology;  Laterality: N/A;  .  TOTAL HIP ARTHROPLASTY Left 07/18/2017   Procedure: LEFT TOTAL HIP ARTHROPLASTY ANTERIOR APPROACH;  Surgeon: [name], MD;  Location: [facility] ORS;  Service: Orthopedics;  Laterality: Left;  . TRANSRECTAL ULTRASOUND  01/09/2018     Medications Prior to Admission: Prior to Admission medications   Medication Sig Start Date End Date Taking? Authorizing Provider  acetaminophen (TYLENOL) 500 MG tablet Take 1,000 mg by mouth every 6 (six) hours as needed for moderate pain or headache.   Yes [provider]  alfuzosin (UROXATRAL) 10 MG 24 hr tablet TAKE 1 TABLET BY MOUTH EVERY DAY Patient taking differently: Take 10 mg by mouth daily with breakfast.  10/22/19  Yes McKenzie, Candee Furbish, MD  aspirin EC 81 MG tablet Take 1 tablet (81 mg total) by mouth daily. Restart in 2 weeks Patient taking differently: Take 81 mg by mouth in the morning.  10/27/18  Yes Kathie Dike, MD  atorvastatin (LIPITOR) 40 MG tablet Take 40 mg by mouth at bedtime.    Yes [provider]  glimepiride (AMARYL) 2 MG tablet Take 2 mg by mouth daily. 10/27/19  Yes [provider]  metoprolol succinate (TOPROL-XL) 50 MG 24 hr tablet Take 25-50 mg by mouth See admin instructions. 50mg  in the morning and 25mg  at bedtime 06/07/17  Yes [provider]  Multiple Vitamins-Minerals (MULTIVITAMIN ADULTS PO) Take 1 tablet by mouth daily.    Yes [provider]  nitroGLYCERIN (NITROSTAT) 0.4 MG SL tablet Place 0.4 mg under the tongue every 5 (five) minutes as needed for chest pain.   Yes [provider]  oxymetazoline (AFRIN) 0.05 % nasal spray Place 1 spray into both nostrils 2 (two) times daily as needed for congestion.   Yes [provider]  pantoprazole (PROTONIX) 40 MG tablet TAKE 1 TABLET BY MOUTH EVERY DAY BEFORE BREAKFAST Patient taking differently: Take 40 mg by mouth in the morning.  11/18/19  Yes Rehman, Mechele Dawley, MD  pioglitazone (ACTOS) 30 MG tablet Take 30 mg by mouth in  the morning.    Yes [provider]  tamsulosin (FLOMAX) 0.4 MG CAPS capsule Take 0.4 mg by mouth in the morning.    Yes [provider]     Allergies:   No Known Allergies  Social History:   Social History   Socioeconomic History  . Marital status: Married    Spouse name: Dot  . Number of children: 2  . Years of education: Not on file  . Highest education level: Not on file  Occupational History  . Occupation: Marine scientist: OTHER    Comment: [employer]   Tobacco Use  . Smoking status: Former Smoker    Packs/day: 1.00    Years: 42.00    Pack years: 42.00    Types: Cigarettes    Start date: 01/28/1961    Quit date: 10/30/1998  Years since quitting: 21.0  . Smokeless tobacco: Never Used  Substance and Sexual Activity  . Alcohol use: No    Alcohol/week: 0.0 standard drinks  . Drug use: No  . Sexual activity: Not Currently  Other Topics Concern  . Not on file  Social History Narrative   Lives with wife in a 2 story home.  They have 2 grown children.     Retired Software engineer.     Investment banker, operational of Radio broadcast assistant Strain:   . Difficulty of Paying Living Expenses:   Food Insecurity:   . Worried About Charity fundraiser in the Last Year:   . Arboriculturist in the Last Year:   Transportation Needs:   . Film/video editor (Medical):   [name] Lack of Transportation (Non-Medical):   Physical Activity:   . Days of Exercise per Week:   . Minutes of Exercise per Session:   Stress:   . Feeling of Stress :   Social Connections:   . Frequency of Communication with Friends and Family:   . Frequency of Social Gatherings with Friends and Family:   . Attends Religious Services:   . Active Member of Clubs or Organizations:   . Attends Archivist Meetings:   [name] Marital Status:   Intimate Partner Violence:   . Fear of Current or Ex-Partner:   . Emotionally Abused:   [name] Physically Abused:   . Sexually Abused:       Family  History:   The patient's family history includes Breast cancer in her daughter; CAD in her father; Healthy in her son; Heart disease in her mother and sister; Hypertension in her mother and sister; Stroke in her brother, father, and mother. There is no history of Prostate cancer, Colon cancer, or Pancreatic cancer.     Review of Systems    General:  No chills, fever, night sweats or weight changes.  Cardiovascular:  No chest pain, palpitations, paroxysmal nocturnal dyspnea. Positive for dyspnea on exertion, edema and orthopnea.  Dermatological: No rash, lesions/masses Respiratory: No cough, Positive for dyspnea. Urologic: No hematuria, dysuria Abdominal:   No nausea, vomiting, diarrhea, bright red blood per rectum, melena, or hematemesis Neurologic:  No visual changes, wkns, changes in mental status. All other systems reviewed and are otherwise negative except as noted above.  Physical Exam    Vitals:   11/25/19 0100 11/25/19 0400 11/25/19 0500 11/25/19 0600  BP: 137/90 136/75 133/68 137/78  Pulse:      Resp: 20 16 (!) 22 20  Temp:      TempSrc:      SpO2: 96%     Weight:      Height:        Intake/Output Summary (Last 24 hours) at 11/25/2019 0844 Last data filed at 11/25/2019 0530 Gross per 24 hour  Intake 604.13 ml  Output 150 ml  Net 454.13 ml   Filed Weights   11/22/19 1529  Weight: 102.1 kg   Body mass index is 35.24 kg/m.   General: Well developed, well nourished,adult in no acute distress. Head: Normocephalic, atraumatic, sclera non-icteric, no xanthomas, nares are without discharge.  Neck: No carotid bruits. JVD not elevated.  Lungs: Respirations regular and unlabored, rales along bases bilaterally Heart: Regular rate and rhythm with occasional ectopic beats. No S3 or S4.  No murmur, no rubs, or gallops appreciated. Abdomen: Soft, non-tender, non-distended with normoactive bowel sounds. No hepatomegaly. No rebound/guarding. No obvious abdominal masses. Msk:  Strength and tone appear normal for age. No joint deformities or effusions. Extremities: No clubbing or cyanosis. 1+ pitting edema bilaterally.  Distal pedal pulses are 2+ bilaterally. Neuro: Alert and oriented X 3. Moves all extremities spontaneously. No focal deficits noted. Psych:  Responds to questions appropriately with a normal affect. Skin: No rashes or lesions noted  Labs and Radiology Studies    EKG:  The ECG that was done was personally reviewed and demonstrates NSR, HR 78 with PAC's with slight ST elevation along Lead III and minimal depression along lateral leads.   Relevant CV Studies:  Echocardiogram: 2013 Study Conclusions   - Left ventricle: The cavity size was normal. There was  moderate focal basal hypertrophy. Systolic function was  normal. The estimated ejection fraction was in the range  of 50% to 55%. Probable hypokinesis of the  basalinferolateral myocardium. Doppler parameters are  consistent with abnormal left ventricular relaxation  (grade 1 diastolic dysfunction).  - Aortic valve: Trileaflet; mildly calcified leaflets. No  significant regurgitation. Mean gradient: 12mm Hg (S).  - Mitral valve: Mild regurgitation.  - Left atrium: The atrium was moderately dilated.  - Right ventricle: The cavity size was mildly dilated. Pacer  wire or catheter noted in right ventricle.  - Tricuspid valve: Mild regurgitation. Peak RV-RA gradient:  74mm Hg (S).  - Pulmonary arteries: Systolic pressure was within the  normal range.  - Pericardium, extracardiac: There was no pericardial  effusion.   NST: 05/2017  No diagnostic ST segment changes to indicate ischemia.  Small, moderate intensity, apical to basal inferolateral defect that is partially reversible and suggestive of scar with mild peri-infarct ischemia.  This is a low risk study.  Nuclear stress EF: 52%.  Laboratory Data:  Chemistry Recent Labs  Lab 11/22/19 1812 11/25/19 0437    NA 141 139  K 4.1 4.3  CL 110 108  CO2 22 22  GLUCOSE 137* 101*  BUN 55* 51*  CREATININE 2.30* 1.98*  CALCIUM 9.2 8.9  GFRNONAA 26* 31*  GFRAA 30* 36*  ANIONGAP 9 9    No results for input(s): PROT, ALBUMIN, AST, ALT, ALKPHOS, BILITOT in the last 168 hours. Hematology Recent Labs  Lab 11/24/19 0532 11/25/19 0437  WBC 4.8 5.1  RBC 3.63* 3.53*  HGB 10.3* 10.1*  HCT 34.9* 34.1*  MCV 96.1 96.6  MCH 28.4 28.6  MCHC 29.5* 29.6*  RDW 15.1 15.2  PLT 112* 106*   Cardiac EnzymesNo results for input(s): TROPONINI in the last 168 hours. No results for input(s): TROPIPOC in the last 168 hours.  BNP Recent Labs  Lab 11/22/19 1812  BNP 1,194.0*    DDimer No results for input(s): DDIMER in the last 168 hours.  Radiology/Studies:  No results found.  Assessment and Plan:   1. NSTEMI with known CAD - he has known CAD and is s/p CABG in 2000 with most recent ischemic evaluation beginning low-risk NST in 2018.  He presented with worsening dyspnea on exertion, orthopnea and edema for the past 3 to 4 weeks. Now getting short of breath with minimal ambulation at home. - Initial HS Troponin 565 with repeat of 506. EKG shows NSR, HR 78 with PAC's with slight ST elevation along Lead III and minimal depression along lateral leads.  - He has been awaiting transfer to [facility] for likely cardiac catheterization but this has not yet been arranged given no bed availability. Did review cardiac catheterization with the patient and he is in agreement to proceed. I  will review the timing of this with Dr. Harl Bowie given the patient's underlying CKD along with reports of tarry stools (occult negative this admission). No LV gram given CKD.  - echocardiogram is pending to assess LV function and wall motion. Continue Heparin along with ASA, statin and BB therapy.   2. Acute CHF Exacerbation (echocardiogram pending to determine systolic versus diastolic dysfunction) - Reports worsening dyspnea on exertion,  orthopnea and lower extremity edema for the past several weeks. BNP was elevated to 1194 and CXR showed increased vascular congestion without edema and chronic trace pleural effusions on admission.  - he has only received 1 dose of IV Lasix thus far. Will dose IV Lasix 40mg  again this AM. Pending repeat labs and timing of catheterization, would titrate to BID dosing.   3. Symptomatic Bradycardia - s/p Medtronic PPM placement which is followed by Dr. Rayann Heman. Device interrogation in 09/2019 showed RV lead noise with normal device function.  4. HTN - BP has been well-controlled at 105/71 - 137/78 within the past 24 hours. Continue PTA Toprol-XL 50mg  in AM/25mg  in PM.   5. HLD - will recheck FLP. Continue Atorvastatin 40mg  daily with goal LDL less than 70 with known CAD.   6. Anemia/Tarry Stools - he reported darker stools for 2 weeks leading to admission with occult blood being negative. Hgb 11.2 on admission, at 10.1 today. Will need to follow-up with GI as an outpatient.   7. Type 2 DM - will hold PTA Actos and Glimepiride. Order SSI.   8. Stage 3 CKD - variable baseline as this ranged from 1.53 to 3.89 during admission in 09/2018. Elevated to 2.30 this admission and improved to 1.98 today.    Severity of Illness: The appropriate patient status for this patient is INPATIENT. Inpatient status is judged to be reasonable and necessary in order to provide the required intensity of service to ensure the patient's safety. The patient's presenting symptoms, physical exam findings, and initial radiographic and laboratory data in the context of their chronic comorbidities is felt to place them at high risk for further clinical deterioration. Furthermore, it is not anticipated that the patient will be medically stable for discharge from the hospital within 2 midnights of admission. The following factors support the patient status of inpatient.   " The patient's presenting symptoms include dyspnea on  exertion, orthopnea, edema. " The worrisome physical exam findings include rales and edema. " The initial radiographic and laboratory data are worrisome because of elevated troponin and BNP. " The chronic co-morbidities include CAD, bradycardia, HTN, HLD and Type 2 DM.   * I certify that at the point of admission it is my clinical judgment that the patient will require inpatient hospital care spanning beyond 2 midnights from the point of admission due to high intensity of service, high risk for further deterioration and high frequency of surveillance required.*    For questions or updates, please contact [facility] Please consult [email or web address] for contact info under Cardiology/STEMI.   Signed, Erma Heritage, PA-C 11/25/2019, 8:44 AM Pager: [phone]   Attending note Patient seen and discussed with PA Ahmed Prima, I agree with her documentation above. 80 yo male history of CAD with prior CABG, COPD, Dm2, HL, HTN, SSS with pacemaker, AAA initially presented with dark stools. Guiac was negative and  Hgb 11. On further workup signs of fluid overload on examinatio and by xray along with trop elevation    Admit labs  BNP 1194 K 4.1 Cr 2.3 BUN 55  Hgb 11.2  hstrop 565-->506--> FOBT neg COVID neg EKG SR, lateral ST depressions  CXR increased vasc congestion Echo pending   NSTEMI, elevated trop 565 trending down with new lateral ST depressions. Presented with signs of CHF, echo is pending. Received IV lasix 40mg  this AM, redose another 40mg  this evening (his second dose) and assess response tomorrow, not scheduling a dose until reevaluated tomororw.    Regarding NSTEMI medical therapy with hep gtt, ASA 81, atorva 40, toprol 25. No ACE/ARB due to renal dysfunction. Would plan for cath once more euvolemic, not ready at this time  Awaiting transfer to [facility] to cardiology service.    Carlyle Dolly MD

## 2019-11-25 NOTE — Plan of Care (Signed)
  Problem: Education: Goal: Knowledge of General Education information will improve Description: Including pain rating scale, medication(s)/side effects and non-pharmacologic comfort measures Outcome: Progressing   Problem: Nutrition: Goal: Adequate nutrition will be maintained Outcome: Progressing   Problem: Coping: Goal: Level of anxiety will decrease Outcome: Progressing   

## 2019-11-25 NOTE — Progress Notes (Signed)
*  PRELIMINARY RESULTS* Echocardiogram 2D Echocardiogram has been performed.  [name] 11/25/2019, 12:46 PM

## 2019-11-25 NOTE — Progress Notes (Signed)
ANTICOAGULATION CONSULT NOTE -   Pharmacy Consult for heparin dosing Indication: ACS/STEMI  No Known Allergies  Patient Measurements: Height: 5\' 7"  (170.2 cm) Weight: 102.1 kg (225 lb) IBW/kg (Calculated) : 66.1 Heparin Dosing Weight: HEPARIN DW (KG): 88.5  Vital Signs: BP: 137/78 (04/27 0600)  Labs: Recent Labs    11/22/19 1812 11/22/19 1812 11/22/19 2027 11/23/19 0501 11/23/19 2210 11/24/19 0532 11/25/19 0437  HGB 11.2*   < >  --   --   --  10.3* 10.1*  HCT 37.3*  --   --   --   --  34.9* 34.1*  PLT 121*  --   --   --   --  112* 106*  APTT  --   --  33  --   --   --   --   LABPROT  --   --  14.8  --   --   --   --   INR  --   --  1.2  --   --   --   --   HEPARINUNFRC  --   --   --    < > 0.45 0.34 0.42  CREATININE 2.30*  --   --   --   --   --  1.98*  TROPONINIHS 565*  --  506*  --   --   --   --    < > = values in this interval not displayed.    Estimated Creatinine Clearance (by C-G formula based on SCr of 1.98 mg/dL (H)) Male: 28.8 mL/min (A) Male: 35 mL/min (A)    Assessment: Pharmacy consulted to dose heparin infusion for this 20 yom with fatigue and elevated troponins. Patient has had several black, tarry stools over the past few days, but stool is guaiac negative and no signs of GI bleed noted.  Baseline Hb is 11.2mg /dL and platelet count is 121K. EKG  shows ischemic changes and possible NSTEMI.  Patient wasn't taking any anti-coagulant medications prior to admission.   HL 0.42- therapeutic   Goal of Therapy:  Heparin level 0.3-0.7 units/ml Monitor platelets by anticoagulation protocol: Yes   Plan:  Continue heparin infusion at 1250 units/hr Heparin level daily Continue to monitor CBC and s/s of bleeding  Margot Ables, PharmD Clinical Pharmacist 11/25/2019 8:42 AM

## 2019-11-26 DIAGNOSIS — I5043 Acute on chronic combined systolic (congestive) and diastolic (congestive) heart failure: Secondary | ICD-10-CM

## 2019-11-26 DIAGNOSIS — I214 Non-ST elevation (NSTEMI) myocardial infarction: Secondary | ICD-10-CM | POA: Diagnosis not present

## 2019-11-26 LAB — GLUCOSE, CAPILLARY
Glucose-Capillary: 103 mg/dL — ABNORMAL HIGH (ref 70–99)
Glucose-Capillary: 98 mg/dL (ref 70–99)
Glucose-Capillary: 136 mg/dL — ABNORMAL HIGH (ref 70–99)
Glucose-Capillary: 146 mg/dL — ABNORMAL HIGH (ref 70–99)

## 2019-11-26 LAB — BASIC METABOLIC PANEL
Anion gap: 14 (ref 5–15)
BUN: 55 mg/dL — ABNORMAL HIGH (ref 8–23)
CO2: 22 mmol/L (ref 22–32)
Calcium: 9.4 mg/dL (ref 8.9–10.3)
Chloride: 106 mmol/L (ref 98–111)
Creatinine, Ser: 2.42 mg/dL — ABNORMAL HIGH (ref 0.61–1.24)
GFR calc Af Amer: 29 mL/min — ABNORMAL LOW (ref >60)
GFR calc non Af Amer: 25 mL/min — ABNORMAL LOW (ref >60)
Glucose, Bld: 120 mg/dL — ABNORMAL HIGH (ref 70–99)
Potassium: 4.3 mmol/L (ref 3.5–5.1)
Sodium: 142 mmol/L (ref 135–145)

## 2019-11-26 LAB — CBC
HCT: 35.7 % — ABNORMAL LOW (ref 39.0–52.0)
Hemoglobin: 10.7 g/dL — ABNORMAL LOW (ref 13.0–17.0)
MCH: 28.5 pg (ref 26.0–34.0)
MCHC: 30 g/dL (ref 30.0–36.0)
MCV: 95.2 fL (ref 80.0–100.0)
Platelets: UNDETERMINED 10*3/uL (ref 150–400)
RBC: 3.75 MIL/uL — ABNORMAL LOW (ref 4.22–5.81)
RDW: 15.2 % (ref 11.5–15.5)
WBC: 5.1 10*3/uL (ref 4.0–10.5)
nRBC: 0 % (ref 0.0–0.2)

## 2019-11-26 LAB — LIPID PANEL
Cholesterol: 95 mg/dL (ref 0–200)
HDL: 47 mg/dL (ref >40)
LDL Cholesterol: 38 mg/dL (ref 0–99)
Total CHOL/HDL Ratio: 2 RATIO
Triglycerides: 48 mg/dL (ref <150)
VLDL: 10 mg/dL (ref 0–40)

## 2019-11-26 LAB — TROPONIN I (HIGH SENSITIVITY): Troponin I (High Sensitivity): 1120 ng/L (ref <18)

## 2019-11-26 LAB — HEPARIN LEVEL (UNFRACTIONATED): Heparin Unfractionated: 0.36 IU/mL (ref 0.30–0.70)

## 2019-11-26 MED ORDER — PNEUMOCOCCAL VAC POLYVALENT 25 MCG/0.5ML IJ INJ: 0.5000 mL | INJECTION | INTRAMUSCULAR | Status: DC

## 2019-11-26 MED ORDER — FUROSEMIDE 10 MG/ML IJ SOLN
40.0000 mg | Freq: Two times a day (BID) | INTRAMUSCULAR | Status: DC
  Administered 2019-11-26 – 2019-11-27 (×3): 40 mg via INTRAVENOUS
  Filled 2019-11-26 (×3): qty 4

## 2019-11-26 NOTE — Progress Notes (Addendum)
Progress Note  Patient Name: [name] Date of Encounter: 11/26/2019  Primary Cardiologist: Thompson Grayer, MD   Subjective   Denies chest pain however still with significant volume overload. Unclear if he would be able to lie flat for cath at this time. SOB with talking today   Inpatient Medications    Scheduled Meds:  aspirin EC  81 mg Oral Daily   atorvastatin  40 mg Oral QHS   insulin aspart  0-15 Units Subcutaneous TID WC   metoprolol succinate  50 mg Oral Daily   And   metoprolol succinate  25 mg Oral QHS   pantoprazole  40 mg Oral q AM   Continuous Infusions:  heparin 1,250 Units/hr (11/26/19 0600)   PRN Meds: acetaminophen, nitroGLYCERIN, ondansetron (ZOFRAN) IV   Vital Signs    Vitals:   11/25/19 2000 11/25/19 2112 11/26/19 0425 11/26/19 0807  BP:  117/77 120/73 118/67  Pulse: (!) 106 73 65 67  Resp: 18 17 19    Temp:  98.1 F (36.7 C) 97.6 F (36.4 C)   TempSrc:  Oral Oral   SpO2: 94% 100% 96%   Weight:   104.8 kg   Height:        Intake/Output Summary (Last 24 hours) at 11/26/2019 0920 Last data filed at 11/26/2019 0600 Gross per 24 hour  Intake 267.83 ml  Output 600 ml  Net -332.17 ml   Filed Weights   11/22/19 1529 11/26/19 0425  Weight: 102.1 kg 104.8 kg    Physical Exam   General: Obese, NAD Skin: Warm, dry, intact  Neck: Negative for carotid bruits. No JVD Lungs: Diminished bilaterally. Breathing is unlabored. Cardiovascular: RRR with S1 S2. No murmurs Abdomen: Soft, non-tender, distended. No obvious abdominal masses. Extremities: 2-3+ BLE. Radial  pulses 2+ bilaterally Neuro: Alert and oriented. No focal deficits. No facial asymmetry. MAE spontaneously. Psych: Responds to questions appropriately with normal affect.    Labs    Chemistry Recent Labs  Lab 11/22/19 1812 11/25/19 0437 11/26/19 0449  NA 141 139 142  K 4.1 4.3 4.3  CL 110 108 106  CO2 22 22 22   GLUCOSE 137* 101* 120*  BUN 55* 51* 55*    CREATININE 2.30* 1.98* 2.42*  CALCIUM 9.2 8.9 9.4  GFRNONAA 26* 31* 25*  GFRAA 30* 36* 29*  ANIONGAP 9 9 14      Hematology Recent Labs  Lab 11/24/19 0532 11/25/19 0437 11/26/19 0449  WBC 4.8 5.1 5.1  RBC 3.63* 3.53* 3.75*  HGB 10.3* 10.1* 10.7*  HCT 34.9* 34.1* 35.7*  MCV 96.1 96.6 95.2  MCH 28.4 28.6 28.5  MCHC 29.5* 29.6* 30.0  RDW 15.1 15.2 15.2  PLT 112* 106* PLATELET CLUMPS NOTED ON SMEAR, UNABLE TO ESTIMATE    Cardiac EnzymesNo results for input(s): TROPONINI in the last 168 hours. No results for input(s): TROPIPOC in the last 168 hours.   BNP Recent Labs  Lab 11/22/19 1812  BNP 1,194.0*     DDimer No results for input(s): DDIMER in the last 168 hours.   Radiology    ECHOCARDIOGRAM COMPLETE  Result Date: 11/25/2019    ECHOCARDIOGRAM REPORT   Patient Name:   TRELYN VANDERLINDE Date of Exam: 11/25/2019 Medical Rec #:  [id number]       Height:       67.0 in Accession #:    [id number]      Weight:       225.0 lb Date of Birth:  May 19, 1941  BSA:          2.126 m Patient Age:    79 years        BP:           139/75 mmHg Patient Gender: M               HR:           83 bpm. Exam Location:  [facility] Procedure: 2D Echo Indications:    NSTEMI I21.4  History:        Patient has prior history of Echocardiogram examinations, most                 recent 01/10/2012. Previous Myocardial Infarction, Pacemaker,                 COPD; Risk Factors:Diabetes, Hypertension, Former Smoker and                 Dyslipidemia. Sepsis.  Sonographer:    Leavy Cella RDCS (AE) Referring Phys: [id number] [place]  1. Left ventricular ejection fraction, by estimation, is 30 to 35%. The left ventricle has moderately decreased function. The left ventricle demonstrates global hypokinesis with regional variation, most prominent basal inferoposterior wall. The left ventricular internal cavity size was mildly dilated. There is moderate left ventricular hypertrophy. Left ventricular  diastolic parameters are indeterminate.  2. Right ventricular systolic function is moderately reduced. The right ventricular size is normal. There is moderately elevated pulmonary artery systolic pressure. The estimated right ventricular systolic pressure is 59.5 mmHg.  3. Left atrial size was moderately dilated.  4. The mitral valve is grossly normal. Trivial mitral valve regurgitation.  5. The aortic valve is tricuspid. Aortic valve regurgitation is not visualized. Mild aortic valve sclerosis is present, with no evidence of aortic valve stenosis.  6. The inferior vena cava is normal in size with greater than 50% respiratory variability, suggesting right atrial pressure of 3 mmHg. FINDINGS  Left Ventricle: Left ventricular ejection fraction, by estimation, is 30 to 35%. The left ventricle has moderately decreased function. The left ventricle demonstrates global hypokinesis. The left ventricular internal cavity size was mildly dilated. There is moderate left ventricular hypertrophy. Left ventricular diastolic parameters are indeterminate. Right Ventricle: The right ventricular size is normal. No increase in right ventricular wall thickness. Right ventricular systolic function is moderately reduced. There is moderately elevated pulmonary artery systolic pressure. The tricuspid regurgitant velocity is 3.30 m/s, and with an assumed right atrial pressure of 3 mmHg, the estimated right ventricular systolic pressure is 63.8 mmHg. Left Atrium: Left atrial size was moderately dilated. Right Atrium: Right atrial size was normal in size. Pericardium: There is no evidence of pericardial effusion. Mitral Valve: The mitral valve is grossly normal. Mild mitral annular calcification. Trivial mitral valve regurgitation. Tricuspid Valve: The tricuspid valve is grossly normal. Tricuspid valve regurgitation is mild. Aortic Valve: The aortic valve is tricuspid. Aortic valve regurgitation is not visualized. Mild aortic valve sclerosis  is present, with no evidence of aortic valve stenosis. Mild aortic valve annular calcification. Pulmonic Valve: The pulmonic valve was grossly normal. Pulmonic valve regurgitation is trivial. Aorta: The aortic root is normal in size and structure. Venous: The inferior vena cava is normal in size with greater than 50% respiratory variability, suggesting right atrial pressure of 3 mmHg. IAS/Shunts: The interatrial septum was not well visualized. Additional Comments: A pacer wire is visualized.  LEFT VENTRICLE PLAX 2D LVIDd:         5.76  cm  Diastology LVIDs:         4.91 cm  LV e' lateral:   7.18 cm/s LV PW:         1.64 cm  LV E/e' lateral: 11.6 LV IVS:        1.21 cm  LV e' medial:    8.27 cm/s LVOT diam:     2.10 cm  LV E/e' medial:  10.0 LVOT Area:     3.46 cm  RIGHT VENTRICLE RV S prime:     7.30 cm/s TAPSE (M-mode): 2.0 cm LEFT ATRIUM             Index LA diam:        4.70 cm 2.21 cm/m LA Vol (A2C):   84.6 ml 39.79 ml/m LA Vol (A4C):   93.3 ml 43.88 ml/m LA Biplane Vol: 91.9 ml 43.23 ml/m   AORTA Ao Root diam: 3.70 cm MITRAL VALVE               TRICUSPID VALVE MV Area (PHT): 3.66 cm    TR Peak grad:   43.6 mmHg MV Decel Time: 207 msec    TR Vmax:        330.00 cm/s MV E velocity: 83.10 cm/s MV A velocity: 43.70 cm/s  SHUNTS MV E/A ratio:  1.90        Systemic Diam: 2.10 cm Rozann Lesches MD Electronically signed by Rozann Lesches MD Signature Date/Time: 11/25/2019/2:22:23 PM    Final    Telemetry    11/26/19 NSR with PACs/PVCs- Personally Reviewed  ECG    No new tracing as of 11/26/19- Personally Reviewed  Cardiac Studies   Echocardiogram: 2013 Study Conclusions   - Left ventricle: The cavity size was normal. There was  moderate focal basal hypertrophy. Systolic function was  normal. The estimated ejection fraction was in the range  of 50% to 55%. Probable hypokinesis of the  basalinferolateral myocardium. Doppler parameters are  consistent with abnormal left ventricular  relaxation  (grade 1 diastolic dysfunction).  - Aortic valve: Trileaflet; mildly calcified leaflets. No  significant regurgitation. Mean gradient: 67mm Hg (S).  - Mitral valve: Mild regurgitation.  - Left atrium: The atrium was moderately dilated.  - Right ventricle: The cavity size was mildly dilated. Pacer  wire or catheter noted in right ventricle.  - Tricuspid valve: Mild regurgitation. Peak RV-RA gradient:  49mm Hg (S).  - Pulmonary arteries: Systolic pressure was within the  normal range.  - Pericardium, extracardiac: There was no pericardial  effusion.   NST: 05/2017  No diagnostic ST segment changes to indicate ischemia.  Small, moderate intensity, apical to basal inferolateral defect that is partially reversible and suggestive of scar with mild peri-infarct ischemia.  This is a low risk study.  Nuclear stress EF: 52%.  Patient Profile     79 y.o. adult with past medical history of CAD (s/p CABG in 2000), symptomatic bradycardia (s/p PPM placement), HTN, HLD, Type 2 DM, OSA and prostate cancer who presented to [facility] ED on 11/22/2019 for evaluation of fatigue and tarry stools.  Assessment & Plan    1. NSTEMI with known CAD: -Has known CAD and is s/p CABG in 2000 with most recent ischemic evaluation beginning low-risk NST in 2018 who presented with worsening dyspnea on exertion, orthopnea and edema -Initial hsT 565 with repeat of 506>>1120 today  -EKG showed NSR, HR 78 with PAC's with slight ST elevation along Lead III and minimal depression along lateral leads.  -  Plan was for transfer to [facility] for LHC scheduled>>not currently on cath board>>unsure if he will be able to lay flat -Continue heparin along with ASA, statin and BB therapy  2. Acute systolic CHF exacerbation: -Presented with worsening dyspnea on exertion, orthopnea and lower extremity edema for the past several weeks. -BNP was elevated to 1194 and CXR showed increased vascular congestion  without edema and chronic trace pleural effusions on admission.  -Echo 09/27/19 with LVEF at 30-35% and global hypokinesis,  Moderate pulmonary hTN with PA pressure at 46.86mmHg -Given one dose IV Lasix 40mg  in the ED -Continues to have fluid volume overload>>>will attempt IV Lasix 40mg  BID with close watch on renal function  -Weight, 231lb today  -I&O, net neg 2.1L  3. Symptomatic Bradycardia: -s/p [employer] PPM placement which is followed by Dr. Rayann Heman. Device interrogation in 09/2019 showed RV lead noise with normal device function. -No recurrence   4. HTN: -Stable, 118/67>>120/73>>117/77 -Continue PTA Toprol-XL 50mg  in AM/25mg  in PM  5. HLD: -LDL, 38 this admission  -Continue Atorvastatin 40mg  daily with goal LDL less than 70 with known CAD.   6. Anemia/Tarry Stools: -CBC stable at 10.7 today however 11.2 on admission  -Reported darker stools for 2 weeks leading to admission with occult blood being negative.  -Will need to follow-up with GI as an outpatient.   7. Type 2 DM: -SSI   8. Stage 3 CKD: -Creatinine elevated today at 2.42 from 1.98 yesterday  -Variable baseline as this ranged from 1.53 to 3.89 during admission in 09/2018.  -Will attempt IV Lasix 40mg  BID and follow with BMET -Unclear if creatinine elevation is due to significant volume overload   Signed, Kathyrn Drown NP-C [place] Pager: [phone] 11/26/2019, 9:20 AM     For questions or updates, please contact   Please consult [email or web address] for contact info under Cardiology/STEMI.  Attending Note:   The patient was seen and examined.  Agree with assessment and plan as noted above.  Changes made to the above note as needed.  Patient seen and independently examined with Kathyrn Drown, NP.   We discussed all aspects of the encounter. I agree with the assessment and plan as stated above.  1.    Acute on chronic  combined  CHF :   Echo yesterday shows EF 30-35%. He is volume overloaded.  Has only  received 1 dose of lasix up at [facility] and then no further doses.  He needs to be diuresed prior to cath. Will cont IV lasix BID .  Anticipate cath in the next day or so .   2.  CAD :    Troponin is 1120.   He will need a cath at some poing  3.  Acute on chronic renal insufficiency :   Will follow closely while he is being diuresed.   4.  DM :   Will get a diabetes care coordinator      I have spent a total of 40 minutes with patient reviewing hospital  notes , telemetry, EKGs, labs and examining patient as well as establishing an assessment and plan that was discussed with the patient. > 50% of time was spent in direct patient care.    Thayer Headings, Brooke Bonito., MD, [facility] 11/26/2019, 11:01 AM [address]  [place] Pager [phone]

## 2019-11-26 NOTE — Evaluation (Signed)
Physical Therapy Evaluation Patient Details Name: [name] MRN: [id number] DOB: 10/31/1940 Today's Date: 11/26/2019   History of Present Illness  79 y.o. adult with past medical history of CAD (s/p CABG in 2000), symptomatic bradycardia (s/p PPM placement), HTN, HLD, Type 2 DM, OSA and prostate cancer who presented to [facility] ED on 11/22/2019 for evaluation of fatigue and tarry stools. Transferred to [place]  PTA pt living with wife in multistory home with bed and bath on main floor and 5 steps to enter. Pt reports ambulation in home supporting himself on the furniture or with use of cane. Pt utilizes a Radiation protection practitioner for community ambulation. Pt independent in ADLs and iADLs. Pt is currently limited by increased oxygen demand (see General comments) as well as decreased strength and endurance. Pt requires min guard for bed mobility, and transfers and min A for ambulation of 25 feet with RW. PT recommending HHPT for return to prior level of mobility and endurance. PT will continue to follow acutely.     Follow Up Recommendations Home health PT;Supervision/Assistance - 24 hour    Equipment Recommendations  None recommended by PT    Recommendations for Other Services       Precautions / Restrictions Precautions Precautions: None Restrictions Weight Bearing Restrictions: No      Mobility  Bed Mobility Overal bed mobility: Needs Assistance Bed Mobility: Sit to Supine       Sit to supine: HOB elevated;Min guard   General bed mobility comments: min guard for safety, increased time and effort required  Transfers Overall transfer level: Needs assistance Equipment used: Rolling walker (2 wheeled) Transfers: Sit to/from Stand Sit to Stand: Min guard         General transfer comment: min guard for safety, discussed pushing off from bed to power up and at last second pt reached up to RW for power up    Ambulation/Gait Ambulation/Gait assistance: Min  assist Gait Distance (Feet): 25 Feet Assistive device: Rolling walker (2 wheeled) Gait Pattern/deviations: Step-through pattern;Decreased step length - right;Decreased step length - left;Trunk flexed Gait velocity: slowed Gait velocity interpretation: 1.31 - 2.62 ft/sec, indicative of limited community ambulator General Gait Details: light min A for steadying and for cuing for proximity to RW. Pt is familiar with Rollator and requires constant multimodal cuing for maintaining proper distance to RW      Balance Overall balance assessment: Mild deficits observed, not formally tested                                           Pertinent Vitals/Pain  No denies pain     Home Living Family/patient expects to be discharged to:: Private residence Living Arrangements: Spouse/significant other Available Help at Discharge: Family;Available 24 hours/day Type of Home: House Home Access: Stairs to enter Entrance Stairs-Rails: Can reach both Entrance Stairs-Number of Steps: 5 Home Layout: One level;Able to live on main level with bedroom/bathroom Home Equipment: Walker - 4 wheels;Grab bars - tub/shower;Hand held shower head;Toilet riser;Shower seat - built in      Prior Function Level of Independence: Independent with assistive device(s)         Comments: ambulates with use of furniture,         Extremity/Trunk Assessment   Upper Extremity Assessment Upper Extremity Assessment: Generalized weakness    Lower Extremity Assessment Lower Extremity Assessment: Generalized weakness  Communication   Communication: No difficulties  Cognition Arousal/Alertness: Awake/alert Behavior During Therapy: WFL for tasks assessed/performed Overall Cognitive Status: Within Functional Limits for tasks assessed                                        General Comments General comments (skin integrity, edema, etc.): Pt on 3L O2 via [place], SaO2 dropped to 88%O2  with ambulation, quickly recovered with purse lip breathing. Pt with c/o of dizziness with positional change that dissipated quickly         Assessment/Plan    PT Assessment Patient needs continued PT services  PT Problem List Decreased strength;Decreased activity tolerance;Decreased mobility;Decreased knowledge of use of DME;Cardiopulmonary status limiting activity       PT Treatment Interventions DME instruction;Gait training;Functional mobility training;Therapeutic activities;Stair training;Therapeutic exercise;Balance training;Cognitive remediation;Patient/family education    PT Goals (Current goals can be found in the Care Plan section)  Acute Rehab PT Goals Patient Stated Goal: get back to his family PT Goal Formulation: With patient Time For Goal Achievement: 12/10/19 Potential to Achieve Goals: Good    Frequency Min 3X/week    AM-PAC PT "6 Clicks" Mobility  Outcome Measure Help needed turning from your back to your side while in a flat bed without using bedrails?: None Help needed moving from lying on your back to sitting on the side of a flat bed without using bedrails?: A Little Help needed moving to and from a bed to a chair (including a wheelchair)?: None Help needed standing up from a chair using your arms (e.g., wheelchair or bedside chair)?: None Help needed to walk in hospital room?: A Little Help needed climbing 3-5 steps with a railing? : A Lot 6 Click Score: 20    End of Session Equipment Utilized During Treatment: Gait belt;Oxygen Activity Tolerance: Patient tolerated treatment well Patient left: in chair;with call bell/phone within reach Nurse Communication: Mobility status PT Visit Diagnosis: Unsteadiness on feet (R26.81);Other abnormalities of gait and mobility (R26.89);Muscle weakness (generalized) (M62.81);Difficulty in walking, not elsewhere classified (R26.2)    Time: 0076-2263 PT Time Calculation (min) (ACUTE ONLY): 26 min   Charges:   PT  Evaluation $PT Eval Moderate Complexity: 1 Mod PT Treatments $Gait Training: 8-22 mins        Jo Booze B. Migdalia Dk PT, DPT Acute Rehabilitation Services Pager [phone] Office [phone]   [place] 11/26/2019, 2:01 PM

## 2019-11-26 NOTE — Plan of Care (Signed)

## 2019-11-26 NOTE — Progress Notes (Signed)
CCMD called RN stating pt had 7 beat run of Vtach. Pt resting in bed comfortably with no signs of distress.  Alver Fisher, RN aware and will continue to monitor.

## 2019-11-26 NOTE — Progress Notes (Signed)
ANTICOAGULATION CONSULT NOTE -   Pharmacy Consult for heparin dosing Indication: ACS/STEMI  No Known Allergies  Patient Measurements: Height: 5\' 7"  (170.2 cm) Weight: 104.8 kg (231 lb 0.7 oz) IBW/kg (Calculated) : 66.1 Heparin Dosing Weight: HEPARIN DW (KG): 88.5  Vital Signs: Temp: 97.6 F (36.4 C) (04/28 0425) Temp Source: Oral (04/28 0425) BP: 120/73 (04/28 0425) Pulse Rate: 65 (04/28 0425)  Labs: Recent Labs    11/24/19 0532 11/24/19 0532 11/25/19 0437 11/26/19 0449  HGB 10.3*   < > 10.1* 10.7*  HCT 34.9*  --  34.1* 35.7*  PLT 112*  --  106* PLATELET CLUMPS NOTED ON SMEAR, UNABLE TO ESTIMATE  HEPARINUNFRC 0.34  --  0.42 0.36  CREATININE  --   --  1.98* 2.42*  TROPONINIHS  --   --   --  1,120*   < > = values in this interval not displayed.    Estimated Creatinine Clearance (by C-G formula based on SCr of 2.42 mg/dL (H)) Male: 23.9 mL/min (A) Male: 29 mL/min (A)    Assessment: Pharmacy consulted to dose heparin infusion for this 79 yom with fatigue and elevated troponins. Patient has had several black, tarry stools over the past few days, but stool is guaiac negative and no signs of GI bleed noted.  Plans noted for cardiac cath   Goal of Therapy:  Heparin level 0.3-0.7 units/ml Monitor platelets by anticoagulation protocol: Yes   Plan:  Continue heparin infusion at 1250 units/hr Heparin level daily Continue to monitor CBC  Hildred Laser, PharmD Clinical Pharmacist **Pharmacist phone directory can now be found on [email or web address] ([email or web address]).  Listed under [place].

## 2019-11-26 NOTE — Progress Notes (Signed)
Elevated troponin reported to night shift nurse at approx 0710. On call PA paged w/ result at this time. Pt is resting and asymptomatic. Will continue to monitor.

## 2019-11-26 NOTE — Progress Notes (Addendum)
Inpatient Diabetes Program Recommendations  AACE/ADA: New Consensus Statement on Inpatient Glycemic Control (2015)  Target Ranges:  Prepandial:   less than 140 mg/dL      Peak postprandial:   less than 180 mg/dL (1-2 hours)      Critically ill patients:  140 - 180 mg/dL   Lab Results  Component Value Date   GLUCAP 98 11/26/2019   HGBA1C 6.0 (H) 10/23/2018    Review of Glycemic Control  Diabetes history: DM 2 Outpatient Diabetes medications: Amaryl 2 mg Daily, Actos 30 mg Daily Current orders for Inpatient glycemic control:  Novolog 0-15 units tid  Inpatient Diabetes Program Recommendations:    Consult for Diabetes  Based on renal function  -  Decrease Novolog Correction scale to "sensitive" 0-9 units tid.  -  D/c Actos at time of d/c due to CHF.  Thanks,  Tama Headings RN, MSN, BC-ADM Inpatient Diabetes Coordinator Team Pager [phone] (8a-5p)

## 2019-11-26 NOTE — Care Management Important Message (Signed)
Important Message  Patient Details  Name: [name] MRN: [id number] Date of Birth: Apr 05, 1941   Medicare Important Message Given:  Yes     Shelda Altes 11/26/2019, 11:58 AM

## 2019-11-26 NOTE — Progress Notes (Signed)
It was reported the pt had a 7 beat run of vtach. On assessment pt was asymptomatic and VSS. PA notified. Will continue to monitor.

## 2019-11-27 ENCOUNTER — Inpatient Hospital Stay (HOSPITAL_COMMUNITY): Payer: Medicare Other

## 2019-11-27 DIAGNOSIS — I214 Non-ST elevation (NSTEMI) myocardial infarction: Secondary | ICD-10-CM | POA: Diagnosis not present

## 2019-11-27 DIAGNOSIS — I5043 Acute on chronic combined systolic (congestive) and diastolic (congestive) heart failure: Secondary | ICD-10-CM | POA: Diagnosis not present

## 2019-11-27 DIAGNOSIS — I10 Essential (primary) hypertension: Secondary | ICD-10-CM | POA: Diagnosis not present

## 2019-11-27 DIAGNOSIS — E782 Mixed hyperlipidemia: Secondary | ICD-10-CM

## 2019-11-27 LAB — HEMOGLOBIN A1C
Hgb A1c MFr Bld: 6.5 % — ABNORMAL HIGH (ref 4.8–5.6)
Mean Plasma Glucose: 139.85 mg/dL

## 2019-11-27 LAB — CBC
HCT: 35.1 % — ABNORMAL LOW (ref 39.0–52.0)
HCT: 35.6 % — ABNORMAL LOW (ref 39.0–52.0)
Hemoglobin: 10.6 g/dL — ABNORMAL LOW (ref 13.0–17.0)
Hemoglobin: 10.7 g/dL — ABNORMAL LOW (ref 13.0–17.0)
MCH: 28.8 pg (ref 26.0–34.0)
MCH: 28.5 pg (ref 26.0–34.0)
MCHC: 30.2 g/dL (ref 30.0–36.0)
MCHC: 30.1 g/dL (ref 30.0–36.0)
MCV: 95.4 fL (ref 80.0–100.0)
MCV: 94.9 fL (ref 80.0–100.0)
Platelets: 117 10*3/uL — ABNORMAL LOW (ref 150–400)
Platelets: 117 10*3/uL — ABNORMAL LOW (ref 150–400)
RBC: 3.68 MIL/uL — ABNORMAL LOW (ref 4.22–5.81)
RBC: 3.75 MIL/uL — ABNORMAL LOW (ref 4.22–5.81)
RDW: 14.9 % (ref 11.5–15.5)
RDW: 15 % (ref 11.5–15.5)
WBC: 5.6 10*3/uL (ref 4.0–10.5)
WBC: 5.4 10*3/uL (ref 4.0–10.5)
nRBC: 0 % (ref 0.0–0.2)
nRBC: 0 % (ref 0.0–0.2)

## 2019-11-27 LAB — BLOOD GAS, ARTERIAL
Acid-base deficit: 0.3 mmol/L (ref 0.0–2.0)
Bicarbonate: 26.5 mmol/L (ref 20.0–28.0)
Drawn by: 511471
FIO2: 32
O2 Saturation: 91.2 %
Patient temperature: 36.7
pCO2 arterial: 65.1 mmHg (ref 32.0–48.0)
pH, Arterial: 7.231 — ABNORMAL LOW (ref 7.350–7.450)
pO2, Arterial: 68.4 mmHg — ABNORMAL LOW (ref 83.0–108.0)

## 2019-11-27 LAB — BASIC METABOLIC PANEL
Anion gap: 11 (ref 5–15)
BUN: 69 mg/dL — ABNORMAL HIGH (ref 8–23)
CO2: 24 mmol/L (ref 22–32)
Calcium: 9 mg/dL (ref 8.9–10.3)
Chloride: 104 mmol/L (ref 98–111)
Creatinine, Ser: 2.62 mg/dL — ABNORMAL HIGH (ref 0.61–1.24)
GFR calc Af Amer: 26 mL/min — ABNORMAL LOW (ref >60)
GFR calc non Af Amer: 22 mL/min — ABNORMAL LOW (ref >60)
Glucose, Bld: 176 mg/dL — ABNORMAL HIGH (ref 70–99)
Potassium: 4 mmol/L (ref 3.5–5.1)
Sodium: 139 mmol/L (ref 135–145)

## 2019-11-27 LAB — GLUCOSE, CAPILLARY
Glucose-Capillary: 113 mg/dL — ABNORMAL HIGH (ref 70–99)
Glucose-Capillary: 144 mg/dL — ABNORMAL HIGH (ref 70–99)
Glucose-Capillary: 142 mg/dL — ABNORMAL HIGH (ref 70–99)
Glucose-Capillary: 126 mg/dL — ABNORMAL HIGH (ref 70–99)

## 2019-11-27 LAB — HEPARIN LEVEL (UNFRACTIONATED): Heparin Unfractionated: 0.45 IU/mL (ref 0.30–0.70)

## 2019-11-27 MED ORDER — SODIUM CHLORIDE 0.9 % WEIGHT BASED INFUSION: 3.0000 mL/kg/h | INTRAVENOUS | Status: DC

## 2019-11-27 MED ORDER — SODIUM CHLORIDE 0.9 % WEIGHT BASED INFUSION: 1.0000 mL/kg/h | INTRAVENOUS | Status: DC

## 2019-11-27 MED ORDER — ASPIRIN 81 MG PO CHEW: 81.0000 mg | CHEWABLE_TABLET | ORAL | Status: DC

## 2019-11-27 MED ORDER — SODIUM CHLORIDE 0.9% FLUSH: 3.0000 mL | INTRAVENOUS | Status: DC | PRN

## 2019-11-27 MED ORDER — TAMSULOSIN HCL 0.4 MG PO CAPS
0.4000 mg | ORAL_CAPSULE | Freq: Every morning | ORAL | Status: DC
  Administered 2019-11-29 – 2019-12-03 (×5): 0.4 mg via ORAL
  Filled 2019-11-27 (×5): qty 1

## 2019-11-27 MED ORDER — SODIUM CHLORIDE 0.9 % IV SOLN: 250.0000 mL | INTRAVENOUS | Status: DC | PRN

## 2019-11-27 MED ORDER — SODIUM CHLORIDE 0.9% FLUSH: 3.0000 mL | Freq: Two times a day (BID) | INTRAVENOUS | Status: DC

## 2019-11-27 MED ORDER — FUROSEMIDE 10 MG/ML IJ SOLN
40.0000 mg | Freq: Once | INTRAMUSCULAR | Status: AC
  Administered 2019-11-27: 40 mg via INTRAVENOUS
  Filled 2019-11-27: qty 4

## 2019-11-27 MED ORDER — ALFUZOSIN HCL ER 10 MG PO TB24
10.0000 mg | ORAL_TABLET | Freq: Every day | ORAL | Status: DC
  Administered 2019-11-27 – 2019-12-03 (×6): 10 mg via ORAL
  Filled 2019-11-27 (×8): qty 1

## 2019-11-27 NOTE — Progress Notes (Signed)
ANTICOAGULATION CONSULT NOTE -   Pharmacy Consult for heparin dosing Indication: ACS/STEMI  No Known Allergies  Patient Measurements: Height: 5\' 7"  (170.2 cm) Weight: 106 kg (233 lb 11 oz) IBW/kg (Calculated) : 66.1 Heparin Dosing Weight: HEPARIN DW (KG): 88.5  Vital Signs: Temp: 97.8 F (36.6 C) (04/29 0302) Temp Source: Oral (04/29 0302) BP: 126/72 (04/29 0302) Pulse Rate: 64 (04/29 0302)  Labs: Recent Labs    11/25/19 0437 11/25/19 0437 11/26/19 0449 11/27/19 0241  HGB 10.1*   < > 10.7* 10.6*  HCT 34.1*  --  35.7* 35.1*  PLT 106*  --  PLATELET CLUMPS NOTED ON SMEAR, UNABLE TO ESTIMATE 117*  HEPARINUNFRC 0.42  --  0.36 0.45  CREATININE 1.98*  --  2.42*  --   TROPONINIHS  --   --  1,120*  --    < > = values in this interval not displayed.    Estimated Creatinine Clearance (by C-G formula based on SCr of 2.42 mg/dL (H)) Male: 24 mL/min (A) Male: 29.2 mL/min (A)    Assessment: Pharmacy consulted to dose heparin infusion for this 75 yom with fatigue and elevated troponins. Patient has had several black, tarry stools over the past few days, but stool is guaiac negative and no signs of GI bleed noted.  Plans noted for cardiac cath if able to lay flat.  Heparin level this morning came back therapeutic at 0.45, on 1250 units/hr. Hgb 10.6, plt 117. No s/sx of bleeding or infusion issues per nursing.  Goal of Therapy:  Heparin level 0.3-0.7 units/ml Monitor platelets by anticoagulation protocol: Yes   Plan:  Continue heparin infusion at 1250 units/hr Heparin level daily Continue to monitor CBC  Antonietta Jewel, PharmD, [place] Pharmacist  Phone: [phone] 11/27/2019 7:24 AM  Please check AMION for all [place] phone numbers After 10:00 PM, call [place] [phone]

## 2019-11-27 NOTE — Significant Event (Signed)
Rapid Response Event Note  Overview: Asked to evaluate pt for labored breathing.   Initial Focused Assessment: Pt laying in bed with mild WOB/accessory muscle use. He is very somnolent. He will awaken to verbal stimulation. When awake, he is confused, but will follow commands, and move all extremities. Pt will answer some questions appropriately. When not stimulated, pt will go to sleep very easily.  Lungs sound diminished t/o. Murmur present. Skin warm and dry. ABD large, soft/distended. Pt denies SOB. HR-64, BP-117/70, RR-18-20, SpO2-90-94% on 3L [place].   Interventions: CBG-120 ABG-7.23/65.1/68.4/26.5 PCXR-Mild vascular congestion is noted. No acute infiltrates seen.  Lasix 40mg  IV x 1 Bipap  Plan of Care (if not transferred): Given lasix and monitor response. RT notified regarding bipap order. Continue to monitor pt. If mentation improves and SpO2 WNL, will get follow-up ABG in AM. If mental status/SpO2 worsens, will obtain ABG earlier. Please call RRT if further assistance needed.  Event Summary:  Dr. Hassell Done notified at 2240-lasix ordered.  Called: 2032 Arrived: 2049 Ended: 2250  [name]

## 2019-11-27 NOTE — Progress Notes (Signed)
Stat ABG recollected, sent to lab.

## 2019-11-27 NOTE — Progress Notes (Signed)
Stat ABG collected, sent to lab.

## 2019-11-27 NOTE — Progress Notes (Addendum)
Progress Note  Patient Name: [name] Date of Encounter: 11/27/2019  Primary Cardiologist: Thompson Grayer, MD  Subjective   Breathing improved today. Continues to have fluid volume overload. Denies chest pain, having nausea   Inpatient Medications    Scheduled Meds: . aspirin EC  81 mg Oral Daily  . atorvastatin  40 mg Oral QHS  . furosemide  40 mg Intravenous BID  . insulin aspart  0-15 Units Subcutaneous TID WC  . metoprolol succinate  50 mg Oral Daily   And  . metoprolol succinate  25 mg Oral QHS  . pantoprazole  40 mg Oral q AM  . pneumococcal 23 valent vaccine  0.5 mL Intramuscular Tomorrow-1000   Continuous Infusions: . heparin 1,250 Units/hr (11/27/19 0400)   PRN Meds: acetaminophen, nitroGLYCERIN, ondansetron (ZOFRAN) IV   Vital Signs    Vitals:   11/26/19 1519 11/26/19 2000 11/26/19 2031 11/27/19 0302  BP: 108/67  127/73 126/72  Pulse: (!) 58  66 64  Resp:  20 (!) 22 17  Temp: 99.3 F (37.4 C)  97.9 F (36.6 C) 97.8 F (36.6 C)  TempSrc: Oral  Oral Oral  SpO2: 97%  99% 100%  Weight:    106 kg  Height:        Intake/Output Summary (Last 24 hours) at 11/27/2019 0911 Last data filed at 11/27/2019 0400 Gross per 24 hour  Intake 273.13 ml  Output --  Net 273.13 ml   Filed Weights   11/22/19 1529 11/26/19 0425 11/27/19 0302  Weight: 102.1 kg 104.8 kg 106 kg    Physical Exam   General: Overweight, NAD Neck: Negative for carotid bruits. No JVD Lungs: Diminished in bilateral upper and lower lobes. Breathing is unlabored. Cardiovascular: RRR with S1 S2. No murmurs Abdomen: Soft, non-tender, distended. No obvious abdominal masses. Extremities: 2-3+ BLE. Radial pulses 2+ bilaterally Neuro: Alert and oriented. No focal deficits. No facial asymmetry. MAE spontaneously. Psych: Responds to questions appropriately with normal affect.    Labs    Chemistry Recent Labs  Lab 11/22/19 1812 11/25/19 0437 11/26/19 0449  NA 141 139 142  K 4.1  4.3 4.3  CL 110 108 106  CO2 22 22 22   GLUCOSE 137* 101* 120*  BUN 55* 51* 55*  CREATININE 2.30* 1.98* 2.42*  CALCIUM 9.2 8.9 9.4  GFRNONAA 26* 31* 25*  GFRAA 30* 36* 29*  ANIONGAP 9 9 14      Hematology Recent Labs  Lab 11/25/19 0437 11/26/19 0449 11/27/19 0241  WBC 5.1 5.1 5.6  RBC 3.53* 3.75* 3.68*  HGB 10.1* 10.7* 10.6*  HCT 34.1* 35.7* 35.1*  MCV 96.6 95.2 95.4  MCH 28.6 28.5 28.8  MCHC 29.6* 30.0 30.2  RDW 15.2 15.2 14.9  PLT 106* PLATELET CLUMPS NOTED ON SMEAR, UNABLE TO ESTIMATE 117*    Cardiac EnzymesNo results for input(s): TROPONINI in the last 168 hours. No results for input(s): TROPIPOC in the last 168 hours.   BNP Recent Labs  Lab 11/22/19 1812  BNP 1,194.0*     DDimer No results for input(s): DDIMER in the last 168 hours.   Radiology    ECHOCARDIOGRAM COMPLETE  Result Date: 11/25/2019    ECHOCARDIOGRAM REPORT   Patient Name:   [name] Date of Exam: 11/25/2019 Medical Rec #:  [id number]       Height:       67.0 in Accession #:    [id number]      Weight:  225.0 lb Date of Birth:  [birth year]        BSA:          2.126 m Patient Age:    78 years        BP:           139/75 mmHg Patient Gender: M               HR:           83 bpm. Exam Location:  [facility] Procedure: 2D Echo Indications:    NSTEMI I21.4  History:        Patient has prior history of Echocardiogram examinations, most                 recent 01/10/2012. Previous Myocardial Infarction, Pacemaker,                 COPD; Risk Factors:Diabetes, Hypertension, Former Smoker and                 Dyslipidemia. Sepsis.  Sonographer:    Leavy Cella RDCS (AE) Referring Phys: [id number] [place]  1. Left ventricular ejection fraction, by estimation, is 30 to 35%. The left ventricle has moderately decreased function. The left ventricle demonstrates global hypokinesis with regional variation, most prominent basal inferoposterior wall. The left ventricular internal cavity size was  mildly dilated. There is moderate left ventricular hypertrophy. Left ventricular diastolic parameters are indeterminate.  2. Right ventricular systolic function is moderately reduced. The right ventricular size is normal. There is moderately elevated pulmonary artery systolic pressure. The estimated right ventricular systolic pressure is 49.4 mmHg.  3. Left atrial size was moderately dilated.  4. The mitral valve is grossly normal. Trivial mitral valve regurgitation.  5. The aortic valve is tricuspid. Aortic valve regurgitation is not visualized. Mild aortic valve sclerosis is present, with no evidence of aortic valve stenosis.  6. The inferior vena cava is normal in size with greater than 50% respiratory variability, suggesting right atrial pressure of 3 mmHg. FINDINGS  Left Ventricle: Left ventricular ejection fraction, by estimation, is 30 to 35%. The left ventricle has moderately decreased function. The left ventricle demonstrates global hypokinesis. The left ventricular internal cavity size was mildly dilated. There is moderate left ventricular hypertrophy. Left ventricular diastolic parameters are indeterminate. Right Ventricle: The right ventricular size is normal. No increase in right ventricular wall thickness. Right ventricular systolic function is moderately reduced. There is moderately elevated pulmonary artery systolic pressure. The tricuspid regurgitant velocity is 3.30 m/s, and with an assumed right atrial pressure of 3 mmHg, the estimated right ventricular systolic pressure is 49.6 mmHg. Left Atrium: Left atrial size was moderately dilated. Right Atrium: Right atrial size was normal in size. Pericardium: There is no evidence of pericardial effusion. Mitral Valve: The mitral valve is grossly normal. Mild mitral annular calcification. Trivial mitral valve regurgitation. Tricuspid Valve: The tricuspid valve is grossly normal. Tricuspid valve regurgitation is mild. Aortic Valve: The aortic valve is  tricuspid. Aortic valve regurgitation is not visualized. Mild aortic valve sclerosis is present, with no evidence of aortic valve stenosis. Mild aortic valve annular calcification. Pulmonic Valve: The pulmonic valve was grossly normal. Pulmonic valve regurgitation is trivial. Aorta: The aortic root is normal in size and structure. Venous: The inferior vena cava is normal in size with greater than 50% respiratory variability, suggesting right atrial pressure of 3 mmHg. IAS/Shunts: The interatrial septum was not well visualized. Additional Comments: A pacer wire is visualized.  LEFT VENTRICLE PLAX 2D LVIDd:         5.76 cm  Diastology LVIDs:         4.91 cm  LV e' lateral:   7.18 cm/s LV PW:         1.64 cm  LV E/e' lateral: 11.6 LV IVS:        1.21 cm  LV e' medial:    8.27 cm/s LVOT diam:     2.10 cm  LV E/e' medial:  10.0 LVOT Area:     3.46 cm  RIGHT VENTRICLE RV S prime:     7.30 cm/s TAPSE (M-mode): 2.0 cm LEFT ATRIUM             Index LA diam:        4.70 cm 2.21 cm/m LA Vol (A2C):   84.6 ml 39.79 ml/m LA Vol (A4C):   93.3 ml 43.88 ml/m LA Biplane Vol: 91.9 ml 43.23 ml/m   AORTA Ao Root diam: 3.70 cm MITRAL VALVE               TRICUSPID VALVE MV Area (PHT): 3.66 cm    TR Peak grad:   43.6 mmHg MV Decel Time: 207 msec    TR Vmax:        330.00 cm/s MV E velocity: 83.10 cm/s MV A velocity: 43.70 cm/s  SHUNTS MV E/A ratio:  1.90        Systemic Diam: 2.10 cm Rozann Lesches MD Electronically signed by Rozann Lesches MD Signature Date/Time: 11/25/2019/2:22:23 PM    Final    Telemetry    11/27/19 NSR with PAC/PVCs - Personally Reviewed  ECG    No new tracing as of 11/27/19- Personally Reviewed  Cardiac Studies   Echocardiogram: 2013 Study Conclusions   - Left ventricle: The cavity size was normal. There was  moderate focal basal hypertrophy. Systolic function was  normal. The estimated ejection fraction was in the range  of 50% to 55%. Probable hypokinesis of the  basalinferolateral  myocardium. Doppler parameters are  consistent with abnormal left ventricular relaxation  (grade 1 diastolic dysfunction).  - Aortic valve: Trileaflet; mildly calcified leaflets. No  significant regurgitation. Mean gradient: 61mm Hg (S).  - Mitral valve: Mild regurgitation.  - Left atrium: The atrium was moderately dilated.  - Right ventricle: The cavity size was mildly dilated. Pacer  wire or catheter noted in right ventricle.  - Tricuspid valve: Mild regurgitation. Peak RV-RA gradient:  66mm Hg (S).  - Pulmonary arteries: Systolic pressure was within the  normal range.  - Pericardium, extracardiac: There was no pericardial  effusion.  NST: 05/2017  No diagnostic ST segment changes to indicate ischemia.  Small, moderate intensity, apical to basal inferolateral defect that is partially reversible and suggestive of scar with mild peri-infarct ischemia.  This is a low risk study.  Nuclear stress EF: 52%.  Patient Profile     79 y.o. adult with past medical history of CAD (s/p CABG in 2000), symptomatic bradycardia (s/p PPM placement), HTN, HLD, Type 2 DM,OSAand prostate cancerwho presented to [facility] ED on 11/22/2019 for evaluation of fatigue and tarry stools.  Assessment & Plan    1. NSTEMI with known CAD: -Has known CAD and is s/p CABG in 2000 with most recent ischemic evaluation beginning low-risk NST in 2018 who presented with worsening dyspnea on exertion, orthopnea and edema -Initial hsT 565 with repeat of 506>>1120 today  -EKG showed NSR, HR 78 with PAC's with slight ST  elevation along Lead III and minimal depression along lateral leads. -Plan was for transfer to [facility] for LHC>>not currently on cath board>>breathing improved today>>may tolerate cath  -Continue heparin along with ASA, statin and BB therapy   2. Acute systolic CHF exacerbation: -Presented with worsening dyspnea on exertion, orthopnea and lower extremity edema for the past  severalweeks. -BNP was elevated to1194andCXR showedincreased vascular congestion without edema and chronic trace pleural effusionson admission.  -Echo 09/27/19 with LVEF at 30-35% and global hypokinesis,  Moderate pulmonary hTN with PA pressure at 46.9mmHg -Continue IV Lasix 40mg  BID with close watch on renal function>>waiting on AM lab work   -Weight, 233lb today>>up from 231lb yesterday  -I&O, net neg 1.8L -Creatinine pending   3. Symptomatic Bradycardia: -s/pMedtronicPPM placement which is followed by Dr. Rayann Heman. Device interrogation in 09/2019 showed RV lead noisewithnormal device function. -No recurrence   4. HTN: -Stable, 126/72>127/73>108/67 -Continue PTA Toprol-XL 50mg  in AM/25mg  in PM  5. HLD: -LDL, 38 this admission  -Continue Atorvastatin 40mg  daily with goal LDL less than 70 with known CAD.   6. Anemia/Tarry Stools: -CBC pending  -Reported darker stools for 2 weeks leading to admission with occult blood being negative.  -Will need to follow-up with GI as an outpatient.   7. Type 2 DM: -SSI while inpatient status   8. Stage 3 CKD: -Creatinine elevated today at 2.42 from 1.98 yesterday  -Variable baseline as this ranged from 1.53 to 3.89 during admission in 09/2018.  -IV Lasix 40mg  BID started yesterday>>waiting for AM BMET   Signed, Kathyrn Drown NP-C [facility] Pager: [phone] 11/27/2019, 9:11 AM     For questions or updates, please contact   Please consult [email or web address] for contact info under Cardiology/STEMI.   Attending Note:   The patient was seen and examined.  Agree with assessment and plan as noted above.  Changes made to the above note as needed.  Patient seen and independently examined with  Kathyrn Drown, NP .   We discussed all aspects of the encounter. I agree with the assessment and plan as stated above.  1.  Acute on chronic combined systolic and diastolic congestive heart failure: Patient had an echocardiogram which  revealed EF of 30 to 35%.  The left ventricular diastolic parameters were indeterminate.  He has had some worsening failure.  He has a history of coronary artery bypass grafting.  He was sent from the [facility] for the purposes of heart catheterization.  We will continue current medications.  He is diuresed nicely.  We will schedule him for heart catheterization today.  2.  Coronary artery disease: He status post coronary artery bypass grafting in 2000.  He has had some worsening heart failure symptoms.  We will schedule him for heart catheterization today.  We discussed the risks, benefits, options.  He understands and agrees to proceed.  3.  Hypertension: Blood pressure is well controlled.  4.  Tarry stools: He originally presented with tarry stools but Hemoccult check was negative.  He should be fine for cath and possible stenting.  5.  Hyperlipidemia: Continue atorvastatin.   I have spent a total of 40 minutes with patient reviewing hospital  notes , telemetry, EKGs, labs and examining patient as well as establishing an assessment and plan that was discussed with the patient. > 50% of time was spent in direct patient care.    Thayer Headings, Brooke Bonito., MD, [facility] 11/27/2019, 10:00 AM [address]  [place] Pager [phone]

## 2019-11-27 NOTE — Progress Notes (Signed)
Inpatient Diabetes Program Recommendations  AACE/ADA: New Consensus Statement on Inpatient Glycemic Control (2015)  Target Ranges:  Prepandial:   less than 140 mg/dL      Peak postprandial:   less than 180 mg/dL (1-2 hours)      Critically ill patients:  140 - 180 mg/dL   Lab Results  Component Value Date   GLUCAP 144 (H) 11/27/2019   HGBA1C 6.5 (H) 11/27/2019    Review of Glycemic Control  Diabetes history: DM 2 Outpatient Diabetes medications: Amaryl 2 mg Daily, Actos 30 mg Daily Current orders for Inpatient glycemic control:  Novolog 0-15 units tid  Inpatient Diabetes Program Recommendations:    -  D/c Actos at time of d/c due to CHF.  Thanks,  Tama Headings RN, MSN, BC-ADM Inpatient Diabetes Coordinator Team Pager [phone] (8a-5p)

## 2019-11-27 NOTE — Plan of Care (Signed)
  Problem: Clinical Measurements: Goal: Cardiovascular complication will be avoided Outcome: Progressing   

## 2019-11-28 ENCOUNTER — Inpatient Hospital Stay (HOSPITAL_COMMUNITY): Payer: Medicare Other

## 2019-11-28 ENCOUNTER — Encounter (HOSPITAL_COMMUNITY)
Admission: EM | Disposition: A | Discharge status: Home Health | Payer: Self-pay | Source: Home / Self Care | Attending: Cardiology

## 2019-11-28 DIAGNOSIS — N1831 Chronic kidney disease, stage 3a: Secondary | ICD-10-CM

## 2019-11-28 DIAGNOSIS — I5021 Acute systolic (congestive) heart failure: Secondary | ICD-10-CM

## 2019-11-28 DIAGNOSIS — I214 Non-ST elevation (NSTEMI) myocardial infarction: Secondary | ICD-10-CM | POA: Diagnosis not present

## 2019-11-28 HISTORY — PX: RIGHT HEART CATH: CATH118263

## 2019-11-28 LAB — POCT I-STAT EG7
Acid-Base Excess: 2 mmol/L (ref 0.0–2.0)
Acid-Base Excess: 3 mmol/L — ABNORMAL HIGH (ref 0.0–2.0)
Acid-base deficit: 1 mmol/L (ref 0.0–2.0)
Bicarbonate: 26 mmol/L (ref 20.0–28.0)
Bicarbonate: 29 mmol/L — ABNORMAL HIGH (ref 20.0–28.0)
Bicarbonate: 29.7 mmol/L — ABNORMAL HIGH (ref 20.0–28.0)
Calcium, Ion: 1.08 mmol/L — ABNORMAL LOW (ref 1.15–1.40)
Calcium, Ion: 1.32 mmol/L (ref 1.15–1.40)
Calcium, Ion: 1.34 mmol/L (ref 1.15–1.40)
HCT: 27 % — ABNORMAL LOW (ref 39.0–52.0)
HCT: 30 % — ABNORMAL LOW (ref 39.0–52.0)
HCT: 30 % — ABNORMAL LOW (ref 39.0–52.0)
Hemoglobin: 10.2 g/dL — ABNORMAL LOW (ref 13.0–17.0)
Hemoglobin: 10.2 g/dL — ABNORMAL LOW (ref 13.0–17.0)
Hemoglobin: 9.2 g/dL — ABNORMAL LOW (ref 13.0–17.0)
O2 Saturation: 71 %
O2 Saturation: 71 %
O2 Saturation: 76 %
Potassium: 3.5 mmol/L (ref 3.5–5.1)
Potassium: 4.1 mmol/L (ref 3.5–5.1)
Potassium: 4.1 mmol/L (ref 3.5–5.1)
Sodium: 142 mmol/L (ref 135–145)
Sodium: 143 mmol/L (ref 135–145)
Sodium: 147 mmol/L — ABNORMAL HIGH (ref 135–145)
TCO2: 28 mmol/L (ref 22–32)
TCO2: 31 mmol/L (ref 22–32)
TCO2: 31 mmol/L (ref 22–32)
pCO2, Ven: 51.8 mmHg (ref 44.0–60.0)
pCO2, Ven: 56.7 mmHg (ref 44.0–60.0)
pCO2, Ven: 58.4 mmHg (ref 44.0–60.0)
pH, Ven: 7.308 (ref 7.250–7.430)
pH, Ven: 7.314 (ref 7.250–7.430)
pH, Ven: 7.317 (ref 7.250–7.430)
pO2, Ven: 42 mmHg (ref 32.0–45.0)
pO2, Ven: 42 mmHg (ref 32.0–45.0)
pO2, Ven: 45 mmHg (ref 32.0–45.0)

## 2019-11-28 LAB — CBC
HCT: 33.6 % — ABNORMAL LOW (ref 39.0–52.0)
Hemoglobin: 10.2 g/dL — ABNORMAL LOW (ref 13.0–17.0)
MCH: 28.7 pg (ref 26.0–34.0)
MCHC: 30.4 g/dL (ref 30.0–36.0)
MCV: 94.6 fL (ref 80.0–100.0)
Platelets: 121 10*3/uL — ABNORMAL LOW (ref 150–400)
RBC: 3.55 MIL/uL — ABNORMAL LOW (ref 4.22–5.81)
RDW: 15 % (ref 11.5–15.5)
WBC: 4.3 10*3/uL (ref 4.0–10.5)
nRBC: 0.5 % — ABNORMAL HIGH (ref 0.0–0.2)

## 2019-11-28 LAB — BASIC METABOLIC PANEL
Anion gap: 8 (ref 5–15)
BUN: 76 mg/dL — ABNORMAL HIGH (ref 8–23)
CO2: 26 mmol/L (ref 22–32)
Calcium: 8.9 mg/dL (ref 8.9–10.3)
Chloride: 106 mmol/L (ref 98–111)
Creatinine, Ser: 2.79 mg/dL — ABNORMAL HIGH (ref 0.61–1.24)
GFR calc Af Amer: 24 mL/min — ABNORMAL LOW (ref >60)
GFR calc non Af Amer: 21 mL/min — ABNORMAL LOW (ref >60)
Glucose, Bld: 101 mg/dL — ABNORMAL HIGH (ref 70–99)
Potassium: 4.1 mmol/L (ref 3.5–5.1)
Sodium: 140 mmol/L (ref 135–145)

## 2019-11-28 LAB — BLOOD GAS, ARTERIAL
Acid-Base Excess: 1.2 mmol/L (ref 0.0–2.0)
Bicarbonate: 26.8 mmol/L (ref 20.0–28.0)
Drawn by: 330991
FIO2: 40
O2 Saturation: 96.8 %
Patient temperature: 37.2
pCO2 arterial: 55.3 mmHg — ABNORMAL HIGH (ref 32.0–48.0)
pH, Arterial: 7.308 — ABNORMAL LOW (ref 7.350–7.450)
pO2, Arterial: 92.5 mmHg (ref 83.0–108.0)

## 2019-11-28 LAB — GLUCOSE, CAPILLARY
Glucose-Capillary: 94 mg/dL (ref 70–99)
Glucose-Capillary: 78 mg/dL (ref 70–99)
Glucose-Capillary: 91 mg/dL (ref 70–99)

## 2019-11-28 LAB — HEPARIN LEVEL (UNFRACTIONATED): Heparin Unfractionated: 0.49 IU/mL (ref 0.30–0.70)

## 2019-11-28 SURGERY — RIGHT HEART CATH
Anesthesia: LOCAL

## 2019-11-28 MED ORDER — LIDOCAINE HCL (PF) 1 % IJ SOLN
INTRAMUSCULAR | Status: DC | PRN
  Administered 2019-11-28: 2 mL

## 2019-11-28 MED ORDER — SODIUM CHLORIDE 0.9% FLUSH
3.0000 mL | Freq: Two times a day (BID) | INTRAVENOUS | Status: DC
  Administered 2019-11-28 – 2019-12-03 (×9): 3 mL via INTRAVENOUS

## 2019-11-28 MED ORDER — ORAL CARE MOUTH RINSE
15.0000 mL | Freq: Two times a day (BID) | OROMUCOSAL | Status: DC
  Administered 2019-11-29 – 2019-12-02 (×7): 15 mL via OROMUCOSAL

## 2019-11-28 MED ORDER — LIDOCAINE HCL (PF) 1 % IJ SOLN
INTRAMUSCULAR | Status: AC
  Filled 2019-11-28: qty 30

## 2019-11-28 MED ORDER — HEPARIN (PORCINE) IN NACL 1000-0.9 UT/500ML-% IV SOLN
INTRAVENOUS | Status: DC | PRN
  Administered 2019-11-28: 500 mL

## 2019-11-28 MED ORDER — IPRATROPIUM-ALBUTEROL 0.5-2.5 (3) MG/3ML IN SOLN
3.0000 mL | Freq: Four times a day (QID) | RESPIRATORY_TRACT | Status: DC
  Administered 2019-11-28 – 2019-11-29 (×5): 3 mL via RESPIRATORY_TRACT
  Filled 2019-11-28 (×5): qty 3

## 2019-11-28 MED ORDER — HEPARIN (PORCINE) IN NACL 1000-0.9 UT/500ML-% IV SOLN
INTRAVENOUS | Status: AC
  Filled 2019-11-28: qty 500

## 2019-11-28 MED ORDER — SODIUM CHLORIDE 0.9 % IV SOLN: INTRAVENOUS | Status: DC

## 2019-11-28 MED ORDER — CHLORHEXIDINE GLUCONATE CLOTH 2 % EX PADS
6.0000 | MEDICATED_PAD | Freq: Every day | CUTANEOUS | Status: DC
  Administered 2019-11-28 – 2019-12-02 (×5): 6 via TOPICAL

## 2019-11-28 MED ORDER — SODIUM CHLORIDE 0.9 % IV SOLN: 250.0000 mL | INTRAVENOUS | Status: DC | PRN

## 2019-11-28 MED ORDER — METOPROLOL SUCCINATE ER 50 MG PO TB24
50.0000 mg | ORAL_TABLET | Freq: Every day | ORAL | Status: DC
  Administered 2019-11-28 – 2019-12-03 (×6): 50 mg via ORAL
  Filled 2019-11-28 (×6): qty 1

## 2019-11-28 MED ORDER — FUROSEMIDE 10 MG/ML IJ SOLN
80.0000 mg | Freq: Two times a day (BID) | INTRAMUSCULAR | Status: DC
  Administered 2019-11-28 – 2019-12-01 (×6): 80 mg via INTRAVENOUS
  Filled 2019-11-28 (×6): qty 8

## 2019-11-28 MED ORDER — SODIUM CHLORIDE 0.9% FLUSH: 10.0000 mL | INTRAVENOUS | Status: DC | PRN

## 2019-11-28 MED ORDER — ASPIRIN 81 MG PO CHEW: 81.0000 mg | CHEWABLE_TABLET | ORAL | Status: DC

## 2019-11-28 MED ORDER — SODIUM CHLORIDE 0.9 % IV SOLN
INTRAVENOUS | Status: AC | PRN
  Administered 2019-11-28: 10 mL/h via INTRAVENOUS

## 2019-11-28 MED ORDER — SODIUM CHLORIDE 0.9% FLUSH: 3.0000 mL | INTRAVENOUS | Status: DC | PRN

## 2019-11-28 MED ORDER — CHLORHEXIDINE GLUCONATE 0.12 % MT SOLN
15.0000 mL | Freq: Two times a day (BID) | OROMUCOSAL | Status: DC
  Administered 2019-11-28 – 2019-12-03 (×9): 15 mL via OROMUCOSAL
  Filled 2019-11-28 (×6): qty 15

## 2019-11-28 MED ORDER — SODIUM CHLORIDE 0.9% FLUSH
10.0000 mL | Freq: Two times a day (BID) | INTRAVENOUS | Status: DC
  Administered 2019-11-28 – 2019-12-01 (×5): 10 mL

## 2019-11-28 SURGICAL SUPPLY — 9 items
CATH SWAN GANZ 7F STRAIGHT (CATHETERS) ×1 IMPLANT
PACK CARDIAC CATHETERIZATION (CUSTOM PROCEDURE TRAY) ×2 IMPLANT
PROTECTION STATION PRESSURIZED (MISCELLANEOUS) ×2
SHEATH PINNACLE 7F 10CM (SHEATH) ×1 IMPLANT
SHEATH PROBE COVER 6X72 (BAG) ×1 IMPLANT
SLEEVE REPOSITIONING LENGTH 30 (MISCELLANEOUS) ×1 IMPLANT
STATION PROTECTION PRESSURIZED (MISCELLANEOUS) IMPLANT
TRANSDUCER W/STOPCOCK (MISCELLANEOUS) ×2 IMPLANT
TUBING ART PRESS 72  MALE/MALE (TUBING) ×1 IMPLANT

## 2019-11-28 NOTE — Progress Notes (Signed)
CRITICAL VALUE ALERT  Critical Value:  pCO2  65.1  Date & Time Notied:  11/27/2019 2231  Provider Notified: [place]   Orders Received/Actions taken: Pt to be placed on BiPap and given 1 time dose of lasix

## 2019-11-28 NOTE — Progress Notes (Signed)
Pt noted to have labored breathing with accessory muscle use, unable to speak more than 2-3 words between breaths, course crackles to all lungs fields, diminished in the bases, pt placed back on Bipap, RT notified, MD aware.

## 2019-11-28 NOTE — Progress Notes (Signed)
Paged Cards for CPAP order. No new orders received.

## 2019-11-28 NOTE — TOC Initial Note (Signed)
Transition of Care [facility]) - Initial/Assessment Note    Patient Details  Name: [name] MRN: [id number] Date of Birth: [birth year]  Transition of [facility]) CM/SW Contact:    Bethena Roys, RN Phone Number: 11/28/2019, 11:18 AM  Clinical Narrative:   Readmission risk assessment completed. Patient presented for worsening dyspnea on exertion. Prior to arrival from home with wife. Patient has support of daughter that lives one street over. Patient has durable medical equipment rolling walker with seat, cane, CPAP, and shower chair. Daughter asked about a hospital bed- Case Manager to follow for orders- MD will need to approve patient for hospital bed. Case Manager will follow for DME BiPAP or Trilogy in the home if needed. Patient is currently on BiPAP at the time of visit. Daughter at bedside- Medicare.gov list provided to patient and family and they chose [facility]) for home health services RN, PT, OT, Aide Services. Referral sent to Butch Penny with [facility] and the agency will follow while patient is in the hospital. Case Manager to follow for possible home [place] and additional transition of care needs.             Expected Discharge Plan: [place] Barriers to Discharge: Continued Medical Work up   Patient Goals and CMS Choice Patient states their goals for this hospitalization and ongoing recovery are:: to return home CMS Medicare.gov Compare Post Acute Care list provided to:: Patient Choice offered to / list presented to : Patient  Expected Discharge Plan and Services Expected Discharge Plan: [place] In-house Referral: NA Discharge Planning Services: CM Consult Post Acute Care Choice: [place] arrangements for the past 2 months: Single Family Home                  HH Arranged: RN, Disease Management, PT, OT, Nurse's Aide [place] Agency: [place] ([facility]) Date HH Agency Contacted: 11/28/19 Time HH  Agency Contacted: 1117 Representative spoke with at [place]: Butch Penny  Prior Living Arrangements/Services Living arrangements for the past 2 months: [place] with:: Spouse Patient language and need for interpreter reviewed:: Yes Do you feel safe going back to the place where you live?: Yes      Need for Family Participation in Patient Care: Yes (Comment) Care giver support system in place?: Yes (comment) Current home services: DME(Pt has rolling walker with seat, shower chair, cpap and cane in the home.) Criminal Activity/Legal Involvement Pertinent to Current Situation/Hospitalization: No - Comment as needed  Activities of Daily Living Home Assistive Devices/Equipment: Cane (specify quad or straight) ADL Screening (condition at time of admission) Patient's cognitive ability adequate to safely complete daily activities?: Yes Is the patient deaf or have difficulty hearing?: No Does the patient have difficulty seeing, even when wearing glasses/contacts?: No Does the patient have difficulty concentrating, remembering, or making decisions?: No Patient able to express need for assistance with ADLs?: Yes Does the patient have difficulty dressing or bathing?: No Independently performs ADLs?: Yes (appropriate for developmental age) Does the patient have difficulty walking or climbing stairs?: No Weakness of Legs: None Weakness of Arms/Hands: None  Permission Sought/Granted Permission sought to share information with : Family Supports, Chartered certified accountant granted to share information with : Yes, Verbal Permission Granted     Permission granted to share info w AGENCY: [facility]        Emotional Assessment Appearance:: Appears stated age Attitude/Demeanor/Rapport: Engaged Affect (typically observed): Appropriate  Orientation: : Oriented to Situation, Oriented to  Time, Oriented to Place, Oriented to Self Alcohol / Substance Use: Not  Applicable Psych Involvement: No (comment)  Admission diagnosis:  NSTEMI (non-ST elevated myocardial infarction) ([place]) [I21.4] Acute congestive heart failure, unspecified heart failure type [facility]) [I50.9] Patient Active Problem List   Diagnosis Date Noted  . NSTEMI (non-ST elevated myocardial infarction) ([place]) 11/22/2019  . Gastric ulcer 02/11/2019  . Acute GI bleeding 10/23/2018  . Hypotension 10/23/2018  . AAA (abdominal aortic aneurysm) ([place]) 10/23/2018  . Sleep apnea 10/23/2018  . Type 2 Diabetes mellitus 10/23/2018  . COPD (chronic obstructive pulmonary disease) ([place]) 10/23/2018  . Pacemaker 10/23/2018  . Passage of bloody stools 10/23/2018  . CKD (chronic kidney disease), stage III 10/23/2018  . Acute renal failure superimposed on stage 3 chronic kidney disease ([place]) 10/23/2018  . UTI (urinary tract infection) 10/23/2018  . Sepsis ([place]) 10/23/2018  . Elevated lactic acid level 10/23/2018  . Pressure injury of skin 10/23/2018  . Hyperkalemia 10/23/2018  . Malignant neoplasm of prostate ([place]) 03/13/2018  . OA (osteoarthritis) of hip 07/18/2017  . 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 Right foot drop 11/18/2015  . Hx of decompressive lumbar laminectomy 11/18/2015  . Diabetic polyneuropathy associated with diabetes mellitus due to underlying condition ([place]) 11/18/2015  . Morbid obesity ([place]) 08/06/2015  . ESSENTIAL HYPERTENSION, BENIGN 10/22/2009  . Coronary atherosclerosis 10/22/2009  . SSS (sick sinus syndrome) ([place]) 10/22/2009   PCP:  Monico Blitz, MD Pharmacy:   [place], [place] [address] [place] [address] Phone: [phone] Fax: [phone]     Readmission Risk Interventions Readmission Risk Prevention Plan 11/28/2019  Transportation Screening Complete  HRI or Home Care Consult Complete  Social Work Consult for [place] Planning/Counseling Complete  Palliative Care Screening Not Applicable  Medication  Review Press photographer) Complete  Some recent data might be hidden

## 2019-11-28 NOTE — Progress Notes (Signed)
Pt more difficult to arouse. Breathing is Labored. Called RRT to assess pt. RRT placed order for stat EKG and ABG.

## 2019-11-28 NOTE — H&P (View-Only) (Signed)
Advanced Heart Failure Team Consult Note   Primary Physician: Monico Blitz, MD PCP-Cardiologist:  Thompson Grayer, MD  Referring: Dr. Acie Fredrickson  Reason for Consultation: Acute systolic HF with AKI  HPI:    [name] is seen today for evaluation of worsening HF and AKI at the request of Dr. Acie Fredrickson.   [name] is a 79 y.o. male with CAD (s/p CABG in 2000), symptomatic bradycardia (s/p PPM placement), HTN, HLD, Type 2 DM, OSA and prostate cancer who presented to [facility] ED on 11/22/2019 for evaluation of fatigue and tarry stools.  He reports worsening dyspnea. LE edema and exertional fatigue for past month. Now NYHA IIIb-IV. He initially thought his symptoms might be secondary to a GI ulcer as he reports similar symptoms when he had significant anemia in the past. He is not on a diuretic at home and reports his family doctor told him he had an "elevated creatinine" in the past.    Initial labs at [facility] showed WBC 4.2, Hgb 11.2, platelets 121, Na+ 141, K+ 4.1 and creatinine 2.30. BNP 1194. Negative for COVID. Initial HS Troponin 565 with repeat of 506 -> 1,120 Occult blood negative. CXR increased vascular congestion without edema and chronic trace pleural effusions. EKG shows NSR, HR 78 with PAC's with slight ST elevation along Lead III and minimal depression along lateral leads.   Echo 4/27 with EF 30-35% and moderate RV dysfunction.   Previous echo 2013 EF 50-55%.  Myoview 11/18 EF 52% with fixed inferolateral defect  Started on IV lasix and initially planned for R/L cath today but last night developed respiratory distress with CO2 retention requiring BIPAP.  Today breathing better but creatinine climbing 1.98 -> 2.4- > 2.8 (baseline seems to be 1.6-2.0)  Review of Systems: [y] = yes, [ ]  = no   . General: Weight gain Blue.Reese ]; Weight loss [ ] ; Anorexia [ ] ; Fatigue [ y]; Fever [ ] ; Chills [ ] ; Weakness [ ]   . Cardiac: Chest pain/pressure [ ] ; Resting SOB Blue.Reese ]; Exertional SOB Blue.Reese ];  Orthopnea Blue.Reese ]; Pedal Edema Blue.Reese ]; Palpitations [ ] ; Syncope [ ] ; Presyncope [ ] ; Paroxysmal nocturnal dyspnea[ ]   . Pulmonary: Cough [ ] ; Wheezing[ ] ; Hemoptysis[ ] ; Sputum [ ] ; Snoring [ ]   . GI: Vomiting[ ] ; Dysphagia[ ] ; Melena[y ]; Hematochezia [ ] ; Heartburn[ ] ; Abdominal pain [ ] ; Constipation [ ] ; Diarrhea [ ] ; BRBPR [ ]   . GU: Hematuria[ ] ; Dysuria [ ] ; Nocturia[ ]   . Vascular: Pain in legs with walking [ ] ; Pain in feet with lying flat [ ] ; Non-healing sores [ ] ; Stroke [ ] ; TIA [ ] ; Slurred speech [ ] ;  . Neuro: Headaches[ ] ; Vertigo[ ] ; Seizures[ ] ; Paresthesias[ ] ;Blurred vision [ ] ; Diplopia [ ] ; Vision changes [ ]   . Ortho/Skin: Arthritis [ y]; Joint pain Blue.Reese ]; Muscle pain [ ] ; Joint swelling [ ] ; Back Pain [ ] ; Rash [ ]   . Psych: Depression[ ] ; Anxiety[ ]   . Heme: Bleeding problems [ ] ; Clotting disorders [ ] ; Anemia [ ]   . Endocrine: Diabetes Blue.Reese ]; Thyroid dysfunction[ ]   Home Medications Prior to Admission medications   Medication Sig Start Date End Date Taking? Authorizing Provider  acetaminophen (TYLENOL) 500 MG tablet Take 1,000 mg by mouth every 6 (six) hours as needed for moderate pain or headache.   Yes [provider]  alfuzosin (UROXATRAL) 10 MG 24 hr tablet TAKE 1 TABLET BY MOUTH EVERY DAY Patient taking  differently: Take 10 mg by mouth daily with breakfast.  10/22/19  Yes McKenzie, Candee Furbish, MD  aspirin EC 81 MG tablet Take 1 tablet (81 mg total) by mouth daily. Restart in 2 weeks Patient taking differently: Take 81 mg by mouth in the morning.  10/27/18  Yes Kathie Dike, MD  atorvastatin (LIPITOR) 40 MG tablet Take 40 mg by mouth at bedtime.    Yes [provider]  glimepiride (AMARYL) 2 MG tablet Take 2 mg by mouth daily. 10/27/19  Yes [provider]  metoprolol succinate (TOPROL-XL) 50 MG 24 hr tablet Take 25-50 mg by mouth See admin instructions. 50mg  in the morning and 25mg  at bedtime 06/07/17  Yes [provider]  Multiple  Vitamins-Minerals (MULTIVITAMIN ADULTS PO) Take 1 tablet by mouth daily.    Yes [provider]  nitroGLYCERIN (NITROSTAT) 0.4 MG SL tablet Place 0.4 mg under the tongue every 5 (five) minutes as needed for chest pain.   Yes [provider]  oxymetazoline (AFRIN) 0.05 % nasal spray Place 1 spray into both nostrils 2 (two) times daily as needed for congestion.   Yes [provider]  pantoprazole (PROTONIX) 40 MG tablet TAKE 1 TABLET BY MOUTH EVERY DAY BEFORE BREAKFAST Patient taking differently: Take 40 mg by mouth in the morning.  11/18/19  Yes Rehman, Mechele Dawley, MD  pioglitazone (ACTOS) 30 MG tablet Take 30 mg by mouth in the morning.    Yes [provider]  tamsulosin (FLOMAX) 0.4 MG CAPS capsule Take 0.4 mg by mouth in the morning.    Yes [provider]    Past Medical History: Past Medical History:  Diagnosis Date  . AAA (abdominal aortic aneurysm) ([place])   . COPD (chronic obstructive pulmonary disease) ([place])   . Coronary artery disease 2000   Status post CABG  . Diabetes mellitus    DM2  . Dyslipidemia   . Dyspnea   . History of kidney stones   . Hypertension   . Obesity   . Osteoarthritis   . Pacemaker   . Prostate cancer ([place])   . Sleep apnea    USING CPAP  . Spinal stenosis   . SSS (sick sinus syndrome) ([place]) 2000   s/p PPM    Past Surgical History: Past Surgical History:  Procedure Laterality Date  . BIOPSY PROSTATE  01/09/2018  . CATARACT EXTRACTION Left   . CORONARY ARTERY BYPASS GRAFT     2000  . ESOPHAGOGASTRODUODENOSCOPY N/A 10/24/2018   Procedure: ESOPHAGOGASTRODUODENOSCOPY (EGD);  Surgeon: Rogene Houston, MD;  Location: [facility];  Service: Endoscopy;  Laterality: N/A;  . ESOPHAGOGASTRODUODENOSCOPY N/A 03/19/2019   Procedure: ESOPHAGOGASTRODUODENOSCOPY (EGD);  Surgeon: Rogene Houston, MD;  Location: [facility] END[facility] SUITE;  Service: Endoscopy;  Laterality: N/A;  255  . JOINT REPLACEMENT     L hip Dr. Wynelle Link 07-18-17   . L5-S1 Gill decompressive laminectomy    . PACEMAKER INSERTION     MDT implanted for sick sinus syndrome  . PERMANENT PACEMAKER GENERATOR CHANGE N/A 03/30/2014   MDT Adapta L generator change by Dr Rayann Heman  . RADIOACTIVE SEED IMPLANT N/A 06/10/2018   Procedure: RADIOACTIVE SEED IMPLANT/BRACHYTHERAPY IMPLANT;  Surgeon: Cleon Gustin, MD;  Location: [facility] ORS;  Service: Urology;  Laterality: N/A;  . REPLACEMENT TOTAL KNEE BILATERAL     BIL  . Right total hip arthroplasty.    . SPACE OAR INSTILLATION N/A 06/10/2018   Procedure: SPACE OAR INSTILLATION;  Surgeon: Cleon Gustin, MD;  Location: [facility] ORS;  Service: Urology;  Laterality: N/A;  . TOTAL HIP ARTHROPLASTY Left 07/18/2017   Procedure: LEFT TOTAL HIP ARTHROPLASTY ANTERIOR APPROACH;  Surgeon: [name], MD;  Location: [facility] ORS;  Service: Orthopedics;  Laterality: Left;  . TRANSRECTAL ULTRASOUND  01/09/2018    Family History: Family History  Problem Relation Age of Onset  . CAD Father   . Stroke Father        Deceased, 4  . Hypertension Mother   . Stroke Mother        Deceased, 32  . Heart disease Mother   . Stroke Brother        Deceased, 61  . Hypertension Sister        Deceased, 21  . Heart disease Sister   . Healthy Son   . Breast cancer Daughter   . Prostate cancer Neg Hx   . Colon cancer Neg Hx   . Pancreatic cancer Neg Hx     Social History: Social History   Socioeconomic History  . Marital status: Married    Spouse name: Dot  . Number of children: 2  . Years of education: Not on file  . Highest education level: Not on file  Occupational History  . Occupation: Marine scientist: OTHER    Comment: [employer]   Tobacco Use  . Smoking status: Former Smoker    Packs/day: 1.00    Years: 42.00    Pack years: 42.00    Types: Cigarettes    Start date: 01/28/1961    Quit date: 10/30/1998    Years since quitting: 21.0  . Smokeless tobacco: Never Used  Substance and Sexual Activity  . Alcohol  use: No    Alcohol/week: 0.0 standard drinks  . Drug use: No  . Sexual activity: Not Currently  Other Topics Concern  . Not on file  Social History Narrative   Lives with wife in a 2 story home.  They have 2 grown children.     Retired Software engineer.     Investment banker, operational of Radio broadcast assistant Strain:   . Difficulty of Paying Living Expenses:   Food Insecurity:   . Worried About Charity fundraiser in the Last Year:   . Arboriculturist in the Last Year:   Transportation Needs:   . Film/video editor (Medical):   [name] Lack of Transportation (Non-Medical):   Physical Activity:   . Days of Exercise per Week:   . Minutes of Exercise per Session:   Stress:   . Feeling of Stress :   Social Connections:   . Frequency of Communication with Friends and Family:   . Frequency of Social Gatherings with Friends and Family:   . Attends Religious Services:   . Active Member of Clubs or Organizations:   . Attends Archivist Meetings:   [name] Marital Status:     Allergies:  No Known Allergies  Objective:    Vital Signs:   Temp:  [98 F (36.7 C)-99 F (37.2 C)] 98.3 F (36.8 C) (04/30 0559) Pulse Rate:  [62-76] 64 (04/30 0753) Resp:  [15-20] 15 (04/30 0753) BP: (104-126)/(50-70) 113/50 (04/30 0559) SpO2:  [96 %-98 %] 97 % (04/30 0912) FiO2 (%):  [40 %] 40 % (04/30 0431) Weight:  [108 kg] 108 kg (04/30 0559) Last BM Date: 11/26/19  Weight change: Filed Weights   11/26/19 0425 11/27/19 0302 11/28/19 0559  Weight: 104.8 kg 106 kg 108 kg  Intake/Output:   Intake/Output Summary (Last 24 hours) at 11/28/2019 1038 Last data filed at 11/28/2019 1031 Gross per 24 hour  Intake 222 ml  Output 1050 ml  Net -828 ml      Physical Exam    General:  Obese elderly male lying in bed On bipap HEENT: normal Neck: supple. JVP to jaw . Carotids 2+ bilat; no bruits. No lymphadenopathy or thyromegaly appreciated. Cor: PMI nondisplaced. Regular rate & rhythm. No rubs,  gallops or murmurs. Lungs: clear + crackles Abdomen: obese soft, nontender, + distended. No hepatosplenomegaly. No bruits or masses. Good bowel sounds. Extremities: no cyanosis, clubbing, rash, 2-3+ edema Neuro: alert & orientedx3, cranial nerves grossly intact. moves all 4 extremities w/o difficulty. Affect pleasant   Telemetry   NSR with intermittent a-pacing 60s Personally reviewed   EKG    11/22/19: NSR 78 with diffuse mild ST depression and mild ST elevation in AVR (mor prominent from previous)  Personally reviewed   Labs   Basic Metabolic Panel: Recent Labs  Lab 11/22/19 1812 11/22/19 1812 11/25/19 0437 11/25/19 0437 11/26/19 0449 11/27/19 0928 11/28/19 0344  NA 141  --  139  --  142 139 140  K 4.1  --  4.3  --  4.3 4.0 4.1  CL 110  --  108  --  106 104 106  CO2 22  --  22  --  22 24 26   GLUCOSE 137*  --  101*  --  120* 176* 101*  BUN 55*  --  51*  --  55* 69* 76*  CREATININE 2.30*  --  1.98*  --  2.42* 2.62* 2.79*  CALCIUM 9.2   < > 8.9   < > 9.4 9.0 8.9   < > = values in this interval not displayed.    Liver Function Tests: No results for input(s): AST, ALT, ALKPHOS, BILITOT, PROT, ALBUMIN in the last 168 hours. No results for input(s): LIPASE, AMYLASE in the last 168 hours. No results for input(s): AMMONIA in the last 168 hours.  CBC: Recent Labs  Lab 11/22/19 1812 11/24/19 0532 11/25/19 0437 11/26/19 0449 11/27/19 0241 11/27/19 0928 11/28/19 0344  WBC 4.2   < > 5.1 5.1 5.6 5.4 4.3  NEUTROABS 3.3  --   --   --   --   --   --   HGB 11.2*   < > 10.1* 10.7* 10.6* 10.7* 10.2*  HCT 37.3*   < > 34.1* 35.7* 35.1* 35.6* 33.6*  MCV 95.6   < > 96.6 95.2 95.4 94.9 94.6  PLT 121*   < > 106* PLATELET CLUMPS NOTED ON SMEAR, UNABLE TO ESTIMATE 117* 117* 121*   < > = values in this interval not displayed.    Cardiac Enzymes: No results for input(s): CKTOTAL, CKMB, CKMBINDEX, TROPONINI in the last 168 hours.  BNP: BNP (last 3 results) Recent Labs     11/22/19 1812  BNP 1,194.0*    ProBNP (last 3 results) No results for input(s): PROBNP in the last 8760 hours.   CBG: Recent Labs  Lab 11/27/19 0743 11/27/19 1141 11/27/19 1655 11/27/19 2059 11/28/19 0839  GLUCAP 113* 144* 142* 126* 94    Coagulation Studies: No results for input(s): LABPROT, INR in the last 72 hours.   Imaging   DG Chest Port 1 View  Result Date: 11/28/2019 CLINICAL DATA:  Shortness of breath. CHF? EXAM: PORTABLE CHEST 1 VIEW COMPARISON:  Chest x-ray dated 11/27/2019. FINDINGS: Stable cardiomegaly. LEFT chest wall pacemaker/ICD  apparatus is stable. Persistent mild central pulmonary vascular congestion. Lungs are otherwise clear. No pleural effusion or pneumothorax is seen. IMPRESSION: Stable cardiomegaly. Persistent mild central pulmonary vascular congestion suggesting mild volume overload/CHF. No evidence of pneumonia or overt alveolar pulmonary edema. Electronically Signed   By: Franki Cabot M.D.   On: 11/28/2019 09:12   DG Chest Port 1 View  Result Date: 11/27/2019 CLINICAL DATA:  Acute respiratory distress EXAM: PORTABLE CHEST 1 VIEW COMPARISON:  11/22/2019 FINDINGS: Cardiac shadow is enlarged but stable. Postsurgical changes are again seen. Pacing device is again noted and stable. Lungs are well aerated bilaterally. Mild vascular congestion is seen without focal infiltrate. No sizable effusion is noted. No acute bony abnormality is seen. Prior fusion in the thoracic and lumbar spine is noted. IMPRESSION: Mild vascular congestion is noted. No acute infiltrates seen. Electronically Signed   By: Inez Catalina M.D.   On: 11/27/2019 21:15      Medications:     Current Medications: . alfuzosin  10 mg Oral Daily  . aspirin  81 mg Oral Pre-Cath  . aspirin EC  81 mg Oral Daily  . atorvastatin  40 mg Oral QHS  . insulin aspart  0-15 Units Subcutaneous TID WC  . ipratropium-albuterol  3 mL Nebulization Q6H  . metoprolol succinate  50 mg Oral Daily   And  .  metoprolol succinate  25 mg Oral QHS  . pantoprazole  40 mg Oral q AM  . pneumococcal 23 valent vaccine  0.5 mL Intramuscular Tomorrow-1000  . sodium chloride flush  3 mL Intravenous Q12H  . sodium chloride flush  3 mL Intravenous Q12H  . sodium chloride flush  3 mL Intravenous Q12H  . tamsulosin  0.4 mg Oral q AM     Infusions: . sodium chloride    . sodium chloride    . sodium chloride    . sodium chloride    . heparin 1,250 Units/hr (11/27/19 1627)        Assessment/Plan   1. NSTEMI with known CAD: - [place] CAD and is s/p CABG in 2000 (LIMA to the LAD, saphenous vein graft to the OM1 and OM2 and posterior descending) - with most recent ischemic evaluation beginning low-risk NST in 2018 whopresented with worsening dyspnea on exertion, orthopnea and edema - Initial hsT565>> 506>>1120  - Given NSTEMI and drop in EF, will need coronary/graft angiography as renal function permits -Continueheparin along with ASA, statin and BB therapy. May need to drop b-blocker if low output  2. AcutesystolicCHFexacerbation: -Presented withworsening dyspnea on exertion, orthopnea and lower extremity edema for the past severalweeks. -BNP was elevated to1194andCXR showedincreased vascular congestion without edema and chronic trace pleural effusionson admission.  -Echo 09/27/19 with LVEF at 30-35% and global hypokinesis (previous EF 52% in 2018), Moderate pulmonary hTN with PA pressure at 46.46mmHg - Looks markedly overloaded. Not responding well to IV lasix with worsening respiratory status and renal function. Concern for low output - Will proceed with RHC today to guide further therapy - Wrap legs. Increase lasix. Add milrinone as needed.   3. Acute hypoxic/hypercarbic respiratory failure: -Placed on Bipap overnight with improvement  - Needs more diuresis - RHC today  4. Acute on chronic renal failure - baseline 1.5-2.0 but as high as 3.9 in past - creatinine 1.98 -> 2.8  currently - concern for ATN/cardiorenal syndrome in setting of possible low output  5. Symptomatic Bradycardia: -s/pMedtronicPPM placement which is followed by Dr. Rayann Heman.  - will need device interrogation  to make sure that he does not have high burden of RV pacing (suspect this is unlikely as AV node has been ok)  6. HLD: -LDL, 38 this admission  -Continue Atorvastatin 40mg  daily with goal LDL less than 70 with known CAD.   7. Anemia/Tarry Stools: -Reported darker stools for 2 weeks leading to admission with occult blood being negative. - Hgb stable -Will need to follow-up with GI as an outpatient.   8. Type 2 DM: -SSIwhile inpatient status   Length of Stay: 6  Glori Bickers, MD  11/28/2019, 10:38 AM  Advanced Heart Failure Team Pager [phone] (M-F; 7a - 4p)  Please contact [facility] for night-coverage after hours (4p -7a ) and weekends on [email or web address]

## 2019-11-28 NOTE — Progress Notes (Signed)
PT Cancellation Note  Patient Details Name: [name] MRN: [id number] DOB: 01-14-1941   Cancelled Treatment:    Reason Eval/Treat Not Completed: Patient at procedure or test/unavailable(Pt in cath lab.  Will return at later date. )   [name] 11/28/2019, 2:39 PM  [name],PT [place] Pager:  [phone]  Office:  [phone]

## 2019-11-28 NOTE — Progress Notes (Addendum)
Progress Note  Patient Name: [name] Date of Encounter: 11/28/2019  Primary Cardiologist: Thompson Grayer, MD  Subjective   Feeling better today. Respiratory status improved after Bipap. Proceeding with [place] today   Inpatient Medications    Scheduled Meds: . alfuzosin  10 mg Oral Daily  . aspirin  81 mg Oral Pre-Cath  . aspirin EC  81 mg Oral Daily  . atorvastatin  40 mg Oral QHS  . insulin aspart  0-15 Units Subcutaneous TID WC  . metoprolol succinate  50 mg Oral Daily   And  . metoprolol succinate  25 mg Oral QHS  . pantoprazole  40 mg Oral q AM  . pneumococcal 23 valent vaccine  0.5 mL Intramuscular Tomorrow-1000  . sodium chloride flush  3 mL Intravenous Q12H  . sodium chloride flush  3 mL Intravenous Q12H  . tamsulosin  0.4 mg Oral q AM   Continuous Infusions: . sodium chloride    . sodium chloride    . sodium chloride    . heparin 1,250 Units/hr (11/27/19 1627)   PRN Meds: sodium chloride, sodium chloride, acetaminophen, nitroGLYCERIN, ondansetron (ZOFRAN) IV, sodium chloride flush, sodium chloride flush   Vital Signs    Vitals:   11/28/19 0431 11/28/19 0559 11/28/19 0753 11/28/19 0754  BP:  (!) 113/50    Pulse: 67 62 64   Resp: 16 17 15    Temp:  98.3 F (36.8 C)    TempSrc:  Oral    SpO2: 96% 98% 97% 96%  Weight:  108 kg    Height:        Intake/Output Summary (Last 24 hours) at 11/28/2019 0827 Last data filed at 11/28/2019 0602 Gross per 24 hour  Intake 582 ml  Output 900 ml  Net -318 ml   Filed Weights   11/26/19 0425 11/27/19 0302 11/28/19 0559  Weight: 104.8 kg 106 kg 108 kg    Physical Exam   General: Well developed, well nourished, NAD Neck: Negative for carotid bruits. No JVD Lungs: Diminished with bilaterally. + wheezes. Breathing is unlabored. Cardiovascular: RRR with S1 S2. No murmurs Abdomen: Soft, non-tender, non-distended. No obvious abdominal masses. Extremities: 2-3+ BLE edema. Radial pulses 2+ bilaterally Neuro:  Alert and oriented. No focal deficits. No facial asymmetry. MAE spontaneously. Psych: Responds to questions appropriately with normal affect.    Labs    Chemistry Recent Labs  Lab 11/26/19 0449 11/27/19 0928 11/28/19 0344  NA 142 139 140  K 4.3 4.0 4.1  CL 106 104 106  CO2 22 24 26   GLUCOSE 120* 176* 101*  BUN 55* 69* 76*  CREATININE 2.42* 2.62* 2.79*  CALCIUM 9.4 9.0 8.9  GFRNONAA 25* 22* 21*  GFRAA 29* 26* 24*  ANIONGAP 14 11 8      Hematology Recent Labs  Lab 11/27/19 0241 11/27/19 0928 11/28/19 0344  WBC 5.6 5.4 4.3  RBC 3.68* 3.75* 3.55*  HGB 10.6* 10.7* 10.2*  HCT 35.1* 35.6* 33.6*  MCV 95.4 94.9 94.6  MCH 28.8 28.5 28.7  MCHC 30.2 30.1 30.4  RDW 14.9 15.0 15.0  PLT 117* 117* 121*    Cardiac EnzymesNo results for input(s): TROPONINI in the last 168 hours. No results for input(s): TROPIPOC in the last 168 hours.   BNP Recent Labs  Lab 11/22/19 1812  BNP 1,194.0*     DDimer No results for input(s): DDIMER in the last 168 hours.   Radiology    DG Chest [facility] 1 View  Result Date:  11/27/2019 CLINICAL DATA:  Acute respiratory distress EXAM: PORTABLE CHEST 1 VIEW COMPARISON:  11/22/2019 FINDINGS: Cardiac shadow is enlarged but stable. Postsurgical changes are again seen. Pacing device is again noted and stable. Lungs are well aerated bilaterally. Mild vascular congestion is seen without focal infiltrate. No sizable effusion is noted. No acute bony abnormality is seen. Prior fusion in the thoracic and lumbar spine is noted. IMPRESSION: Mild vascular congestion is noted. No acute infiltrates seen. Electronically Signed   By: Inez Catalina M.D.   On: 11/27/2019 21:15   Telemetry    11/28/19 NSR with AV pacing -Personally Reviewed  ECG    No new tracing as of 11/28/19- Personally Reviewed  Cardiac Studies   Echocardiogram: 2013 Study Conclusions   - Left ventricle: The cavity size was normal. There was  moderate focal basal hypertrophy. Systolic  function was  normal. The estimated ejection fraction was in the range  of 50% to 55%. Probable hypokinesis of the  basalinferolateral myocardium. Doppler parameters are  consistent with abnormal left ventricular relaxation  (grade 1 diastolic dysfunction).  - Aortic valve: Trileaflet; mildly calcified leaflets. No  significant regurgitation. Mean gradient: 28mm Hg (S).  - Mitral valve: Mild regurgitation.  - Left atrium: The atrium was moderately dilated.  - Right ventricle: The cavity size was mildly dilated. Pacer  wire or catheter noted in right ventricle.  - Tricuspid valve: Mild regurgitation. Peak RV-RA gradient:  54mm Hg (S).  - Pulmonary arteries: Systolic pressure was within the  normal range.  - Pericardium, extracardiac: There was no pericardial  effusion.  NST: 05/2017  No diagnostic ST segment changes to indicate ischemia.  Small, moderate intensity, apical to basal inferolateral defect that is partially reversible and suggestive of scar with mild peri-infarct ischemia.  This is a low risk study.  Nuclear stress EF: 52%.  Patient Profile     79 y.o. adult with past medical history of CAD (s/p CABG in 2000), symptomatic bradycardia (s/p PPM placement), HTN, HLD, Type 2 DM,OSAand prostate cancerwho presented to [facility] on 11/22/2019 for evaluation of fatigue and tarry stools.  Assessment & Plan    1. NSTEMI with known CAD: -[place] and is s/p CABG in 2000 with most recent ischemic evaluation beginning low-risk NST in 2018 whopresented with worsening dyspnea on exertion, orthopnea and edema -Initial hsT565>> 506>>1120  -Plan was for transfer to [facility] for LHC>>not currently on cath board>>had respiratory episode yesterday evening and was placed on Bipap>>>PCO2 elevated at 65 with improvement  -Doing well today. Per patient and family, he wears CPAP at night. Will continue with nocturnal Bipap  -Will proceed with RHC today  -Continueheparin along with ASA, statin and BB therapy  2. AcutesystolicCHFexacerbation: -Presented withworsening dyspnea on exertion, orthopnea and lower extremity edema for the past severalweeks. -BNP was elevated to1194andCXR showedincreased vascular congestion without edema and chronic trace pleural effusionson admission.  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 Symptomatic Bradycardia: -s/pMedtronicPPM placement which is followed by Dr. Rayann Heman. Device interrogation in 09/2019 showed RV lead noisewithnormal device function. -No recurrence  4. HTN: -Stable, 113/50>104/58  -Continue PTA Toprol-XL 50mg  in AM/25mg  in PM  5. HLD: -LDL, 38 this admission  -Continue Atorvastatin 40mg  daily with goal LDL less than 70 with known CAD.   6.  Anemia/Tarry Stools: -CBC pending  -Reported darker stools for 2 weeks leading to admission with occult blood being negative. -Will need to follow-up with GI as an outpatient.   7. Type 2 DM: -SSIwhile inpatient status   8. Stage 3 CKD: -Creatinine elevated today at 2.42 from 1.98 yesterday  -Variable baseline as this ranged from 1.53 to 3.89 during admission in 09/2018. -Creatinine, 2.79 today   9. Respiratory failure: -In the setting of CO2 retention  -Placed on Bipap overnight with improvement  -Wheezing on exam today -Will place on nebs    Signed, Kathyrn Drown NP-C [place] Pager: [phone] 11/28/2019, 8:27 AM     For questions or updates, please contact   Please consult [email or web address] for contact info under Cardiology/STEMI.  Attending Note:   The patient was seen and examined.  Agree with assessment and plan as noted above.  Changes made to the above note as needed.  Patient  seen and independently examined with  Kathyrn Drown, NP .   We discussed all aspects of the encounter. I agree with the assessment and plan as stated above.  1. NSTEMI with known CAD: troponins were minimally positive.   Not having active ischemia at this point    2. 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  He has estimated PA pressure of around 45 mmHg.  Is complicated by an elevated creatinine which is 2.79 this morning. I discussed the case with Dr. Haroldine Laws.   He plans doing a right heart catheterization on him today.  Depending on the numbers he will either leave the [facility] in and send him to the unit for milrinone therapy.  If the hemodynamics are okay, he will traded out for central line to allow [place] to be able to measure Cholox measurements and obtain CVP levels.   3. Symptomatic Bradycardia: S/p pacer   4. HTN: BP is stable   5. HLD: -LDL, 38 this admission  -Continue Atorvastatin 40mg  daily with goal LDL less than 70 with known CAD.   6. Anemia/Tarry Stools: -CBC pending  -Reported darker stools for 2 weeks leading to admission with occult blood being negative. -Will need to follow-up with GI as an outpatient.   7. Type 2 DM: -SSIwhile inpatient status   8. Stage 3 CKD: Creatinine is slightly higher today  For R heart cath       Ramond Dial., MD, [facility] 11/28/2019, 3:27 PM [address],  [place] Pager [phone]

## 2019-11-28 NOTE — Progress Notes (Signed)
ANTICOAGULATION CONSULT NOTE -   Pharmacy Consult for heparin dosing Indication: ACS/STEMI  No Known Allergies  Patient Measurements: Height: 5\' 7"  (170.2 cm) Weight: 108 kg (238 lb 1.6 oz) IBW/kg (Calculated) : 66.1 Heparin Dosing Weight: HEPARIN DW (KG): 88.5  Vital Signs: Temp: 98.3 F (36.8 C) (04/30 0559) Temp Source: Oral (04/30 0559) BP: 113/50 (04/30 0559) Pulse Rate: 64 (04/30 0753)  Labs: Recent Labs    11/26/19 0449 11/26/19 0449 11/27/19 0241 11/27/19 0241 11/27/19 0928 11/28/19 0344  HGB 10.7*   < > 10.6*   < > 10.7* 10.2*  HCT 35.7*   < > 35.1*  --  35.6* 33.6*  PLT PLATELET CLUMPS NOTED ON SMEAR, UNABLE TO ESTIMATE   < > 117*  --  117* 121*  HEPARINUNFRC 0.36  --  0.45  --   --  0.49  CREATININE 2.42*  --   --   --  2.62* 2.79*  TROPONINIHS 1,120*  --   --   --   --   --    < > = values in this interval not displayed.    Estimated Creatinine Clearance (by C-G formula based on SCr of 2.79 mg/dL (H)) Male: 21 mL/min (A) Male: 25.6 mL/min (A)    Assessment: Pharmacy consulted to dose heparin infusion for this 62 yom with fatigue and elevated troponins. Patient has had several black, tarry stools over the past few days, but stool is guaiac negative and no signs of GI bleed noted.  Plans noted for cardiac cath -heparin level at goal    Goal of Therapy:  Heparin level 0.3-0.7 units/ml Monitor platelets by anticoagulation protocol: Yes   Plan:  Continue heparin infusion at 1250 units/hr Heparin level daily Continue to monitor CBC  Hildred Laser, PharmD Clinical Pharmacist **Pharmacist phone directory can now be found on [email or web address] ([email or web address]).  Listed under [place].

## 2019-11-28 NOTE — Consult Note (Signed)
Advanced Heart Failure Team Consult Note   Primary Physician: Monico Blitz, MD PCP-Cardiologist:  Thompson Grayer, MD  Referring: Dr. Acie Fredrickson  Reason for Consultation: Acute systolic HF with AKI  HPI:    [name] is seen today for evaluation of worsening HF and AKI at the request of Dr. Acie Fredrickson.   [name] is a 79 y.o. male with CAD (s/p CABG in 2000), symptomatic bradycardia (s/p PPM placement), HTN, HLD, Type 2 DM, OSA and prostate cancer who presented to [facility] ED on 11/22/2019 for evaluation of fatigue and tarry stools.  He reports worsening dyspnea. LE edema and exertional fatigue for past month. Now NYHA IIIb-IV. He initially thought his symptoms might be secondary to a GI ulcer as he reports similar symptoms when he had significant anemia in the past. He is not on a diuretic at home and reports his family doctor told him he had an "elevated creatinine" in the past.    Initial labs at [facility] showed WBC 4.2, Hgb 11.2, platelets 121, Na+ 141, K+ 4.1 and creatinine 2.30. BNP 1194. Negative for COVID. Initial HS Troponin 565 with repeat of 506 -> 1,120 Occult blood negative. CXR increased vascular congestion without edema and chronic trace pleural effusions. EKG shows NSR, HR 78 with PAC's with slight ST elevation along Lead III and minimal depression along lateral leads.   Echo 4/27 with EF 30-35% and moderate RV dysfunction.   Previous echo 2013 EF 50-55%.  Myoview 11/18 EF 52% with fixed inferolateral defect  Started on IV lasix and initially planned for R/L cath today but last night developed respiratory distress with CO2 retention requiring BIPAP.  Today breathing better but creatinine climbing 1.98 -> 2.4- > 2.8 (baseline seems to be 1.6-2.0)  Review of Systems: [y] = yes, [ ]  = no   . General: Weight gain Blue.Reese ]; Weight loss [ ] ; Anorexia [ ] ; Fatigue [ y]; Fever [ ] ; Chills [ ] ; Weakness [ ]   . Cardiac: Chest pain/pressure [ ] ; Resting SOB Blue.Reese ]; Exertional SOB Blue.Reese ];  Orthopnea Blue.Reese ]; Pedal Edema Blue.Reese ]; Palpitations [ ] ; Syncope [ ] ; Presyncope [ ] ; Paroxysmal nocturnal dyspnea[ ]   . Pulmonary: Cough [ ] ; Wheezing[ ] ; Hemoptysis[ ] ; Sputum [ ] ; Snoring [ ]   . GI: Vomiting[ ] ; Dysphagia[ ] ; Melena[y ]; Hematochezia [ ] ; Heartburn[ ] ; Abdominal pain [ ] ; Constipation [ ] ; Diarrhea [ ] ; BRBPR [ ]   . GU: Hematuria[ ] ; Dysuria [ ] ; Nocturia[ ]   . Vascular: Pain in legs with walking [ ] ; Pain in feet with lying flat [ ] ; Non-healing sores [ ] ; Stroke [ ] ; TIA [ ] ; Slurred speech [ ] ;  . Neuro: Headaches[ ] ; Vertigo[ ] ; Seizures[ ] ; Paresthesias[ ] ;Blurred vision [ ] ; Diplopia [ ] ; Vision changes [ ]   . Ortho/Skin: Arthritis [ y]; Joint pain Blue.Reese ]; Muscle pain [ ] ; Joint swelling [ ] ; Back Pain [ ] ; Rash [ ]   . Psych: Depression[ ] ; Anxiety[ ]   . Heme: Bleeding problems [ ] ; Clotting disorders [ ] ; Anemia [ ]   . Endocrine: Diabetes Blue.Reese ]; Thyroid dysfunction[ ]   Home Medications Prior to Admission medications   Medication Sig Start Date End Date Taking? Authorizing Provider  acetaminophen (TYLENOL) 500 MG tablet Take 1,000 mg by mouth every 6 (six) hours as needed for moderate pain or headache.   Yes [provider]  alfuzosin (UROXATRAL) 10 MG 24 hr tablet TAKE 1 TABLET BY MOUTH EVERY DAY Patient taking  differently: Take 10 mg by mouth daily with breakfast.  10/22/19  Yes McKenzie, Candee Furbish, MD  aspirin EC 81 MG tablet Take 1 tablet (81 mg total) by mouth daily. Restart in 2 weeks Patient taking differently: Take 81 mg by mouth in the morning.  10/27/18  Yes Kathie Dike, MD  atorvastatin (LIPITOR) 40 MG tablet Take 40 mg by mouth at bedtime.    Yes [provider]  glimepiride (AMARYL) 2 MG tablet Take 2 mg by mouth daily. 10/27/19  Yes [provider]  metoprolol succinate (TOPROL-XL) 50 MG 24 hr tablet Take 25-50 mg by mouth See admin instructions. 50mg  in the morning and 25mg  at bedtime 06/07/17  Yes [provider]  Multiple  Vitamins-Minerals (MULTIVITAMIN ADULTS PO) Take 1 tablet by mouth daily.    Yes [provider]  nitroGLYCERIN (NITROSTAT) 0.4 MG SL tablet Place 0.4 mg under the tongue every 5 (five) minutes as needed for chest pain.   Yes [provider]  oxymetazoline (AFRIN) 0.05 % nasal spray Place 1 spray into both nostrils 2 (two) times daily as needed for congestion.   Yes [provider]  pantoprazole (PROTONIX) 40 MG tablet TAKE 1 TABLET BY MOUTH EVERY DAY BEFORE BREAKFAST Patient taking differently: Take 40 mg by mouth in the morning.  11/18/19  Yes Rehman, Mechele Dawley, MD  pioglitazone (ACTOS) 30 MG tablet Take 30 mg by mouth in the morning.    Yes [provider]  tamsulosin (FLOMAX) 0.4 MG CAPS capsule Take 0.4 mg by mouth in the morning.    Yes [provider]    Past Medical History: Past Medical History:  Diagnosis Date  . AAA (abdominal aortic aneurysm) ([place])   . COPD (chronic obstructive pulmonary disease) ([place])   . Coronary artery disease 2000   Status post CABG  . Diabetes mellitus    DM2  . Dyslipidemia   . Dyspnea   . History of kidney stones   . Hypertension   . Obesity   . Osteoarthritis   . Pacemaker   . Prostate cancer ([place])   . Sleep apnea    USING CPAP  . Spinal stenosis   . SSS (sick sinus syndrome) ([place]) 2000   s/p PPM    Past Surgical History: Past Surgical History:  Procedure Laterality Date  . BIOPSY PROSTATE  01/09/2018  . CATARACT EXTRACTION Left   . CORONARY ARTERY BYPASS GRAFT     2000  . ESOPHAGOGASTRODUODENOSCOPY N/A 10/24/2018   Procedure: ESOPHAGOGASTRODUODENOSCOPY (EGD);  Surgeon: Rogene Houston, MD;  Location: [facility];  Service: Endoscopy;  Laterality: N/A;  . ESOPHAGOGASTRODUODENOSCOPY N/A 03/19/2019   Procedure: ESOPHAGOGASTRODUODENOSCOPY (EGD);  Surgeon: Rogene Houston, MD;  Location: [facility]ITE;  Service: Endoscopy;  Laterality: N/A;  255  . JOINT REPLACEMENT     L hip Dr. Wynelle Link 07-18-17   . L5-S1 Gill decompressive laminectomy    . PACEMAKER INSERTION     MDT implanted for sick sinus syndrome  . PERMANENT PACEMAKER GENERATOR CHANGE N/A 03/30/2014   MDT Adapta L generator change by Dr Rayann Heman  . RADIOACTIVE SEED IMPLANT N/A 06/10/2018   Procedure: RADIOACTIVE SEED IMPLANT/BRACHYTHERAPY IMPLANT;  Surgeon: Cleon Gustin, MD;  Location: [facility] ORS;  Service: Urology;  Laterality: N/A;  . REPLACEMENT TOTAL KNEE BILATERAL     BIL  . Right total hip arthroplasty.    . SPACE OAR INSTILLATION N/A 06/10/2018   Procedure: SPACE OAR INSTILLATION;  Surgeon: Cleon Gustin, MD;  Location: [facility] ORS;  Service: Urology;  Laterality: N/A;  . TOTAL HIP ARTHROPLASTY Left 07/18/2017   Procedure: LEFT TOTAL HIP ARTHROPLASTY ANTERIOR APPROACH;  Surgeon: [name], MD;  Location: [facility] ORS;  Service: Orthopedics;  Laterality: Left;  . TRANSRECTAL ULTRASOUND  01/09/2018    Family History: Family History  Problem Relation Age of Onset  . CAD Father   . Stroke Father        Deceased, 30  . Hypertension Mother   . Stroke Mother        Deceased, 20  . Heart disease Mother   . Stroke Brother        Deceased, 63  . Hypertension Sister        Deceased, 13  . Heart disease Sister   . Healthy Son   . Breast cancer Daughter   . Prostate cancer Neg Hx   . Colon cancer Neg Hx   . Pancreatic cancer Neg Hx     Social History: Social History   Socioeconomic History  . Marital status: Married    Spouse name: Dot  . Number of children: 2  . Years of education: Not on file  . Highest education level: Not on file  Occupational History  . Occupation: Marine scientist: OTHER    Comment: [employer]   Tobacco Use  . Smoking status: Former Smoker    Packs/day: 1.00    Years: 42.00    Pack years: 42.00    Types: Cigarettes    Start date: 01/28/1961    Quit date: 10/30/1998    Years since quitting: 21.0  . Smokeless tobacco: Never Used  Substance and Sexual Activity  . Alcohol  use: No    Alcohol/week: 0.0 standard drinks  . Drug use: No  . Sexual activity: Not Currently  Other Topics Concern  . Not on file  Social History Narrative   Lives with wife in a 2 story home.  They have 2 grown children.     Retired Software engineer.     Investment banker, operational of Radio broadcast assistant Strain:   . Difficulty of Paying Living Expenses:   Food Insecurity:   . Worried About Charity fundraiser in the Last Year:   . Arboriculturist in the Last Year:   Transportation Needs:   . Film/video editor (Medical):   [name] Lack of Transportation (Non-Medical):   Physical Activity:   . Days of Exercise per Week:   . Minutes of Exercise per Session:   Stress:   . Feeling of Stress :   Social Connections:   . Frequency of Communication with Friends and Family:   . Frequency of Social Gatherings with Friends and Family:   . Attends Religious Services:   . Active Member of Clubs or Organizations:   . Attends Archivist Meetings:   [name] Marital Status:     Allergies:  No Known Allergies  Objective:    Vital Signs:   Temp:  [98 F (36.7 C)-99 F (37.2 C)] 98.3 F (36.8 C) (04/30 0559) Pulse Rate:  [62-76] 64 (04/30 0753) Resp:  [15-20] 15 (04/30 0753) BP: (104-126)/(50-70) 113/50 (04/30 0559) SpO2:  [96 %-98 %] 97 % (04/30 0912) FiO2 (%):  [40 %] 40 % (04/30 0431) Weight:  [108 kg] 108 kg (04/30 0559) Last BM Date: 11/26/19  Weight change: Filed Weights   11/26/19 0425 11/27/19 0302 11/28/19 0559  Weight: 104.8 kg 106 kg 108 kg  Intake/Output:   Intake/Output Summary (Last 24 hours) at 11/28/2019 1038 Last data filed at 11/28/2019 1031 Gross per 24 hour  Intake 222 ml  Output 1050 ml  Net -828 ml      Physical Exam    General:  Obese elderly male lying in bed On bipap HEENT: normal Neck: supple. JVP to jaw . Carotids 2+ bilat; no bruits. No lymphadenopathy or thyromegaly appreciated. Cor: PMI nondisplaced. Regular rate & rhythm. No rubs,  gallops or murmurs. Lungs: clear + crackles Abdomen: obese soft, nontender, + distended. No hepatosplenomegaly. No bruits or masses. Good bowel sounds. Extremities: no cyanosis, clubbing, rash, 2-3+ edema Neuro: alert & orientedx3, cranial nerves grossly intact. moves all 4 extremities w/o difficulty. Affect pleasant   Telemetry   NSR with intermittent a-pacing 60s Personally reviewed   EKG    11/22/19: NSR 78 with diffuse mild ST depression and mild ST elevation in AVR (mor prominent from previous)  Personally reviewed   Labs   Basic Metabolic Panel: Recent Labs  Lab 11/22/19 1812 11/22/19 1812 11/25/19 0437 11/25/19 0437 11/26/19 0449 11/27/19 0928 11/28/19 0344  NA 141  --  139  --  142 139 140  K 4.1  --  4.3  --  4.3 4.0 4.1  CL 110  --  108  --  106 104 106  CO2 22  --  22  --  22 24 26   GLUCOSE 137*  --  101*  --  120* 176* 101*  BUN 55*  --  51*  --  55* 69* 76*  CREATININE 2.30*  --  1.98*  --  2.42* 2.62* 2.79*  CALCIUM 9.2   < > 8.9   < > 9.4 9.0 8.9   < > = values in this interval not displayed.    Liver Function Tests: No results for input(s): AST, ALT, ALKPHOS, BILITOT, PROT, ALBUMIN in the last 168 hours. No results for input(s): LIPASE, AMYLASE in the last 168 hours. No results for input(s): AMMONIA in the last 168 hours.  CBC: Recent Labs  Lab 11/22/19 1812 11/24/19 0532 11/25/19 0437 11/26/19 0449 11/27/19 0241 11/27/19 0928 11/28/19 0344  WBC 4.2   < > 5.1 5.1 5.6 5.4 4.3  NEUTROABS 3.3  --   --   --   --   --   --   HGB 11.2*   < > 10.1* 10.7* 10.6* 10.7* 10.2*  HCT 37.3*   < > 34.1* 35.7* 35.1* 35.6* 33.6*  MCV 95.6   < > 96.6 95.2 95.4 94.9 94.6  PLT 121*   < > 106* PLATELET CLUMPS NOTED ON SMEAR, UNABLE TO ESTIMATE 117* 117* 121*   < > = values in this interval not displayed.    Cardiac Enzymes: No results for input(s): CKTOTAL, CKMB, CKMBINDEX, TROPONINI in the last 168 hours.  BNP: BNP (last 3 results) Recent Labs     11/22/19 1812  BNP 1,194.0*    ProBNP (last 3 results) No results for input(s): PROBNP in the last 8760 hours.   CBG: Recent Labs  Lab 11/27/19 0743 11/27/19 1141 11/27/19 1655 11/27/19 2059 11/28/19 0839  GLUCAP 113* 144* 142* 126* 94    Coagulation Studies: No results for input(s): LABPROT, INR in the last 72 hours.   Imaging   DG Chest Port 1 View  Result Date: 11/28/2019 CLINICAL DATA:  Shortness of breath. CHF? EXAM: PORTABLE CHEST 1 VIEW COMPARISON:  Chest x-ray dated 11/27/2019. FINDINGS: Stable cardiomegaly. LEFT chest wall pacemaker/ICD  apparatus is stable. Persistent mild central pulmonary vascular congestion. Lungs are otherwise clear. No pleural effusion or pneumothorax is seen. IMPRESSION: Stable cardiomegaly. Persistent mild central pulmonary vascular congestion suggesting mild volume overload/CHF. No evidence of pneumonia or overt alveolar pulmonary edema. Electronically Signed   By: Franki Cabot M.D.   On: 11/28/2019 09:12   DG Chest Port 1 View  Result Date: 11/27/2019 CLINICAL DATA:  Acute respiratory distress EXAM: PORTABLE CHEST 1 VIEW COMPARISON:  11/22/2019 FINDINGS: Cardiac shadow is enlarged but stable. Postsurgical changes are again seen. Pacing device is again noted and stable. Lungs are well aerated bilaterally. Mild vascular congestion is seen without focal infiltrate. No sizable effusion is noted. No acute bony abnormality is seen. Prior fusion in the thoracic and lumbar spine is noted. IMPRESSION: Mild vascular congestion is noted. No acute infiltrates seen. Electronically Signed   By: Inez Catalina M.D.   On: 11/27/2019 21:15      Medications:     Current Medications: . alfuzosin  10 mg Oral Daily  . aspirin  81 mg Oral Pre-Cath  . aspirin EC  81 mg Oral Daily  . atorvastatin  40 mg Oral QHS  . insulin aspart  0-15 Units Subcutaneous TID WC  . ipratropium-albuterol  3 mL Nebulization Q6H  . metoprolol succinate  50 mg Oral Daily   And  .  metoprolol succinate  25 mg Oral QHS  . pantoprazole  40 mg Oral q AM  . pneumococcal 23 valent vaccine  0.5 mL Intramuscular Tomorrow-1000  . sodium chloride flush  3 mL Intravenous Q12H  . sodium chloride flush  3 mL Intravenous Q12H  . sodium chloride flush  3 mL Intravenous Q12H  . tamsulosin  0.4 mg Oral q AM     Infusions: . sodium chloride    . sodium chloride    . sodium chloride    . sodium chloride    . heparin 1,250 Units/hr (11/27/19 1627)        Assessment/Plan   1. NSTEMI with known CAD: - Hasknown CAD and is s/p CABG in 2000 (LIMA to the LAD, saphenous vein graft to the OM1 and OM2 and posterior descending) - with most recent ischemic evaluation beginning low-risk NST in 2018 whopresented with worsening dyspnea on exertion, orthopnea and edema - Initial hsT565>> 506>>1120  - Given NSTEMI and drop in EF, will need coronary/graft angiography as renal function permits -Continueheparin along with ASA, statin and BB therapy. May need to drop b-blocker if low output  2. AcutesystolicCHFexacerbation: -Presented withworsening dyspnea on exertion, orthopnea and lower extremity edema for the past severalweeks. -BNP was elevated to1194andCXR showedincreased vascular congestion without edema and chronic trace pleural effusionson admission.  -Echo 09/27/19 with LVEF at 30-35% and global hypokinesis (previous EF 52% in 2018), Moderate pulmonary hTN with PA pressure at 46.58mmHg - Looks markedly overloaded. Not responding well to IV lasix with worsening respiratory status and renal function. Concern for low output - Will proceed with RHC today to guide further therapy - Wrap legs. Increase lasix. Add milrinone as needed.   3. Acute hypoxic/hypercarbic respiratory failure: -Placed on Bipap overnight with improvement  - Needs more diuresis - RHC today  4. Acute on chronic renal failure - baseline 1.5-2.0 but as high as 3.9 in past - creatinine 1.98 -> 2.8  currently - concern for ATN/cardiorenal syndrome in setting of possible low output  5. Symptomatic Bradycardia: -s/pMedtronicPPM placement which is followed by Dr. Rayann Heman.  - will need device interrogation  to make sure that he does not have high burden of RV pacing (suspect this is unlikely as AV node has been ok)  6. HLD: -LDL, 38 this admission  -Continue Atorvastatin 40mg  daily with goal LDL less than 70 with known CAD.   7. Anemia/Tarry Stools: -Reported darker stools for 2 weeks leading to admission with occult blood being negative. - Hgb stable -Will need to follow-up with GI as an outpatient.   8. Type 2 DM: -SSIwhile inpatient status   Length of Stay: 6  Glori Bickers, MD  11/28/2019, 10:38 AM  Advanced Heart Failure Team Pager [phone] (M-F; 7a - 4p)  Please contact [facility] for night-coverage after hours (4p -7a ) and weekends on [email or web address]

## 2019-11-28 NOTE — Progress Notes (Signed)
Progress Note  Patient Name: [name] Date of Encounter: 11/28/2019  Primary Cardiologist: Thompson Grayer, MD  Subjective   79 year old gentleman who was transferred from [facility] for further evaluation of congestive heart failure and positive troponin levels.  He has a history of coronary artery disease-status post coronary artery bypass grafting in 2000.  He has had bradycardia and is status post pacemaker implantation.  He has a history of hypertension, hyperlipidemia, type 2 diabetes mellitus, CKD  , obstructive sleep apnea and prostate cancer He sleeps with CPAP at night..  Last night he had some worsening respiratory distress.  He has CO2 retention.  His creatinine continues to rise.  Echocardiogram from April 27 reveals moderate to severe LV dysfunction with an EF of 30 to 35%.  We were unable to determine diastolic parameters.  His RV function was moderately reduced.  He has moderate pulmonary hypertension with a PA estimated pressure of 47 mmHg.     Inpatient Medications    Scheduled Meds: . alfuzosin  10 mg Oral Daily  . aspirin  81 mg Oral Pre-Cath  . aspirin EC  81 mg Oral Daily  . atorvastatin  40 mg Oral QHS  . insulin aspart  0-15 Units Subcutaneous TID WC  . metoprolol succinate  50 mg Oral Daily   And  . metoprolol succinate  25 mg Oral QHS  . pantoprazole  40 mg Oral q AM  . pneumococcal 23 valent vaccine  0.5 mL Intramuscular Tomorrow-1000  . sodium chloride flush  3 mL Intravenous Q12H  . sodium chloride flush  3 mL Intravenous Q12H  . tamsulosin  0.4 mg Oral q AM   Continuous Infusions: . sodium chloride    . sodium chloride    . sodium chloride    . heparin 1,250 Units/hr (11/27/19 1627)   PRN Meds: sodium chloride, sodium chloride, acetaminophen, nitroGLYCERIN, ondansetron (ZOFRAN) IV, sodium chloride flush, sodium chloride flush   Vital Signs    Vitals:   11/28/19 0431 11/28/19 0559 11/28/19 0753 11/28/19 0754  BP:   (!) 113/50    Pulse: 67 62 64   Resp: 16 17 15    Temp:  98.3 F (36.8 C)    TempSrc:  Oral    SpO2: 96% 98% 97% 96%  Weight:  108 kg    Height:        Intake/Output Summary (Last 24 hours) at 11/28/2019 0816 Last data filed at 11/28/2019 0602 Gross per 24 hour  Intake 582 ml  Output 900 ml  Net -318 ml   Filed Weights   11/26/19 0425 11/27/19 0302 11/28/19 0559  Weight: 104.8 kg 106 kg 108 kg    Physical Exam   Physical Exam: Blood pressure (!) 113/50, pulse 64, temperature 98.3 F (36.8 C), temperature source Oral, resp. rate 15, height 5\' 7"  (1.702 m), weight 108 kg, SpO2 96 %.  GEN:   Elderly male,   Breathing better ,  Still wheezing ,  Somewhat  frail  HEENT: Normal NECK: No JVD; No carotid bruits LYMPHATICS: No lymphadenopathy CARDIAC: RR  RESPIRATORY:  Clear to auscultation without rales, wheezing or rhonchi  ABDOMEN: Soft, non-tender, non-distended MUSCULOSKELETAL:  No edema; No deformity  SKIN: Warm and dry NEUROLOGIC:  Alert and oriented x 3   Labs    Chemistry Recent Labs  Lab 11/26/19 0449 11/27/19 0928 11/28/19 0344  NA 142 139 140  K 4.3 4.0 4.1  CL 106 104 106  CO2 22  24 26  GLUCOSE 120* 176* 101*  BUN 55* 69* 76*  CREATININE 2.42* 2.62* 2.79*  CALCIUM 9.4 9.0 8.9  GFRNONAA 25* 22* 21*  GFRAA 29* 26* 24*  ANIONGAP 14 11 8      Hematology Recent Labs  Lab 11/27/19 0241 11/27/19 0928 11/28/19 0344  WBC 5.6 5.4 4.3  RBC 3.68* 3.75* 3.55*  HGB 10.6* 10.7* 10.2*  HCT 35.1* 35.6* 33.6*  MCV 95.4 94.9 94.6  MCH 28.8 28.5 28.7  MCHC 30.2 30.1 30.4  RDW 14.9 15.0 15.0  PLT 117* 117* 121*    Cardiac EnzymesNo results for input(s): TROPONINI in the last 168 hours. No results for input(s): TROPIPOC in the last 168 hours.   BNP Recent Labs  Lab 11/22/19 1812  BNP 1,194.0*     DDimer No results for input(s): DDIMER in the last 168 hours.   Radiology    DG Chest Port 1 View  Result Date: 11/27/2019 CLINICAL DATA:  Acute  respiratory distress EXAM: PORTABLE CHEST 1 VIEW COMPARISON:  11/22/2019 FINDINGS: Cardiac shadow is enlarged but stable. Postsurgical changes are again seen. Pacing device is again noted and stable. Lungs are well aerated bilaterally. Mild vascular congestion is seen without focal infiltrate. No sizable effusion is noted. No acute bony abnormality is seen. Prior fusion in the thoracic and lumbar spine is noted. IMPRESSION: Mild vascular congestion is noted. No acute infiltrates seen. Electronically Signed   By: Inez Catalina M.D.   On: 11/27/2019 21:15   Telemetry    11/27/19 NSR with PAC/PVCs - Personally Reviewed  ECG    No new tracing as of 11/27/19- Personally Reviewed  Cardiac Studies   Echocardiogram: 2013 Study Conclusions   - Left ventricle: The cavity size was normal. There was  moderate focal basal hypertrophy. Systolic function was  normal. The estimated ejection fraction was in the range  of 50% to 55%. Probable hypokinesis of the  basalinferolateral myocardium. Doppler parameters are  consistent with abnormal left ventricular relaxation  (grade 1 diastolic dysfunction).  - Aortic valve: Trileaflet; mildly calcified leaflets. No  significant regurgitation. Mean gradient: 21mm Hg (S).  - Mitral valve: Mild regurgitation.  - Left atrium: The atrium was moderately dilated.  - Right ventricle: The cavity size was mildly dilated. Pacer  wire or catheter noted in right ventricle.  - Tricuspid valve: Mild regurgitation. Peak RV-RA gradient:  32mm Hg (S).  - Pulmonary arteries: Systolic pressure was within the  normal range.  - Pericardium, extracardiac: There was no pericardial  effusion.  NST: 05/2017  No diagnostic ST segment changes to indicate ischemia.  Small, moderate intensity, apical to basal inferolateral defect that is partially reversible and suggestive of scar with mild peri-infarct ischemia.  This is a low risk study.  Nuclear stress  EF: 52%.  Patient Profile     79 y.o. adult with past medical history of CAD (s/p CABG in 2000), symptomatic bradycardia (s/p PPM placement), HTN, HLD, Type 2 DM,OSAand prostate cancerwho presented to [facility] ED on 11/22/2019 for evaluation of fatigue and tarry stools.  Assessment & Plan    1. NSTEMI with known CAD: troponins were minimally positive.   Not having active ischemia at this point    2. 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  He has estimated PA pressure of around 45 mmHg.  Is complicated by an elevated creatinine which is 2.79 this  morning. I discussed the case with Dr. Haroldine Laws.   He plans doing a right heart catheterization on him today.  Depending on the numbers [facility] to [facility] unit for milrinone therapy.  If the hemodynamics are okay, he will traded out for central line to allow [place] to be able to measure Cholox measurements and obtain CVP levels.   3. Symptomatic Bradycardia: S/p pacer   4. HTN: BP is stable   5. HLD: -LDL, 38 this admission  -Continue Atorvastatin 40mg  daily with goal LDL less than 70 with known CAD.   6. Anemia/Tarry Stools: -CBC pending  -Reported darker stools for 2 weeks leading to admission with occult blood being negative.  -Will need to follow-up with GI as an outpatient.   7. Type 2 DM: -SSI while inpatient status   8. Stage 3 CKD: Creatinine is slightly higher today  For R heart cath     Mertie Moores, MD  11/28/2019 8:51 AM    [facility] [place],  [place], [place]  [address] Phone: [phone]; Fax: [phone]

## 2019-11-28 NOTE — Interval H&P Note (Signed)
History and Physical Interval Note:  11/28/2019 2:27 PM  [name]  has presented today for surgery, with the diagnosis of heart failure.  The various methods of treatment have been discussed with the patient and family. After consideration of risks, benefits and other options for treatment, the patient has consented to  Procedure(s): RIGHT HEART CATH (N/A) as a surgical intervention.  The patient's history has been reviewed, patient examined, no change in status, stable for surgery.  I have reviewed the patient's chart and labs.  Questions were answered to the patient's satisfaction.     [name]

## 2019-11-29 DIAGNOSIS — I214 Non-ST elevation (NSTEMI) myocardial infarction: Secondary | ICD-10-CM | POA: Diagnosis not present

## 2019-11-29 DIAGNOSIS — I5043 Acute on chronic combined systolic (congestive) and diastolic (congestive) heart failure: Secondary | ICD-10-CM | POA: Diagnosis not present

## 2019-11-29 LAB — CBC
HCT: 34.5 % — ABNORMAL LOW (ref 39.0–52.0)
Hemoglobin: 10.4 g/dL — ABNORMAL LOW (ref 13.0–17.0)
MCH: 28.3 pg (ref 26.0–34.0)
MCHC: 30.1 g/dL (ref 30.0–36.0)
MCV: 94 fL (ref 80.0–100.0)
Platelets: 116 10*3/uL — ABNORMAL LOW (ref 150–400)
RBC: 3.67 MIL/uL — ABNORMAL LOW (ref 4.22–5.81)
RDW: 14.8 % (ref 11.5–15.5)
WBC: 3.7 10*3/uL — ABNORMAL LOW (ref 4.0–10.5)
nRBC: 0 % (ref 0.0–0.2)

## 2019-11-29 LAB — GLUCOSE, CAPILLARY
Glucose-Capillary: 90 mg/dL (ref 70–99)
Glucose-Capillary: 195 mg/dL — ABNORMAL HIGH (ref 70–99)
Glucose-Capillary: 160 mg/dL — ABNORMAL HIGH (ref 70–99)
Glucose-Capillary: 150 mg/dL — ABNORMAL HIGH (ref 70–99)

## 2019-11-29 LAB — BASIC METABOLIC PANEL
Anion gap: 10 (ref 5–15)
BUN: 71 mg/dL — ABNORMAL HIGH (ref 8–23)
CO2: 29 mmol/L (ref 22–32)
Calcium: 9.2 mg/dL (ref 8.9–10.3)
Chloride: 105 mmol/L (ref 98–111)
Creatinine, Ser: 2.46 mg/dL — ABNORMAL HIGH (ref 0.61–1.24)
GFR calc Af Amer: 28 mL/min — ABNORMAL LOW (ref >60)
GFR calc non Af Amer: 24 mL/min — ABNORMAL LOW (ref >60)
Glucose, Bld: 90 mg/dL (ref 70–99)
Potassium: 3.7 mmol/L (ref 3.5–5.1)
Sodium: 144 mmol/L (ref 135–145)

## 2019-11-29 LAB — MRSA PCR SCREENING: MRSA by PCR: NEGATIVE

## 2019-11-29 MED ORDER — CLOPIDOGREL BISULFATE 75 MG PO TABS
75.0000 mg | ORAL_TABLET | Freq: Every day | ORAL | Status: DC
  Administered 2019-11-30 – 2019-12-03 (×4): 75 mg via ORAL
  Filled 2019-11-29 (×4): qty 1

## 2019-11-29 MED ORDER — POTASSIUM CHLORIDE CRYS ER 20 MEQ PO TBCR
40.0000 meq | EXTENDED_RELEASE_TABLET | Freq: Once | ORAL | Status: AC
  Administered 2019-11-29: 40 meq via ORAL
  Filled 2019-11-29: qty 2

## 2019-11-29 MED ORDER — SODIUM CHLORIDE 0.9 % IV SOLN: INTRAVENOUS | Status: DC | PRN

## 2019-11-29 MED ORDER — CLOPIDOGREL BISULFATE 300 MG PO TABS
300.0000 mg | ORAL_TABLET | Freq: Once | ORAL | Status: AC
  Administered 2019-11-29: 300 mg via ORAL
  Filled 2019-11-29: qty 1

## 2019-11-29 MED ORDER — IPRATROPIUM-ALBUTEROL 0.5-2.5 (3) MG/3ML IN SOLN: 3.0000 mL | Freq: Four times a day (QID) | RESPIRATORY_TRACT | Status: DC | PRN

## 2019-11-29 MED ORDER — HEPARIN SODIUM (PORCINE) 5000 UNIT/ML IJ SOLN
5000.0000 [IU] | Freq: Three times a day (TID) | INTRAMUSCULAR | Status: DC
  Administered 2019-11-29 – 2019-12-03 (×13): 5000 [IU] via SUBCUTANEOUS
  Filled 2019-11-29 (×13): qty 1

## 2019-11-29 NOTE — Plan of Care (Signed)
Stable overnight.   Diuresed well with approx 4 L urine (+ 3 occurrences when primfit leaked). Fine bibasilar crackles resolved. Wt down to 101.5 kg (from 108 kg) - different bed.     CO/CI stable. BiPAP with stable sats, tolerated 3 L n/c for approx 20 min last night for pm meds and oral care.   Problem: Education: Goal: Knowledge of General Education information will improve Description: Including pain rating scale, medication(s)/side effects and non-pharmacologic comfort measures Outcome: Progressing   Problem: Health Behavior/Discharge Planning: Goal: Ability to manage health-related needs will improve Outcome: Progressing   Problem: Clinical Measurements: Goal: Ability to maintain clinical measurements within normal limits will improve Outcome: Progressing Goal: Will remain free from infection Outcome: Progressing Goal: Diagnostic test results will improve Outcome: Progressing Goal: Respiratory complications will improve Outcome: Progressing Goal: Cardiovascular complication will be avoided Outcome: Progressing   Problem: Coping: Goal: Level of anxiety will decrease Outcome: Progressing   Problem: Elimination: Goal: Will not experience complications related to bowel motility Outcome: Progressing Goal: Will not experience complications related to urinary retention Outcome: Progressing   Problem: Pain Managment: Goal: General experience of comfort will improve Outcome: Progressing   Problem: Safety: Goal: Ability to remain free from injury will improve Outcome: Progressing

## 2019-11-29 NOTE — Progress Notes (Signed)
Patient ID: [name], adult   DOB: [birth year], 79 y.o.   MRN: [id number]     Advanced Heart Failure Rounding Note  PCP-Cardiologist: Thompson Grayer, MD   Subjective:    Just taken off Bipap this morning.  No complaints, no dyspnea at rest.    Good diuresis overnight on Lasix 80 mg IV bid. Creatinine 2.79 => 2.46.   Swan numbers today: RA 14 PA 57/24 CI 3  RHC (4/30):  RA = 16 RV = 59/16 PA = 56/26 (34) PCW = 24 (v = 34) Fick cardiac output/index = 8.3/3.8 Thermo CO/CI = 5.1/2.3 PVR = 2.0 (thermo) FA sat = 96% PA sat = 71%, 71% SVC sat = 70%  Objective:   Weight Range: 101.5 kg Body mass index is 35.05 kg/m.   Vital Signs:   Temp:  [97.7 F (36.5 C)-98.6 F (37 C)] 98.4 F (36.9 C) (05/01 0700) Pulse Rate:  [0-101] 64 (05/01 0700) Resp:  [0-27] 15 (05/01 0700) BP: (101-141)/(52-97) 120/61 (05/01 0700) SpO2:  [0 %-100 %] 96 % (05/01 0721) FiO2 (%):  [40 %] 40 % (05/01 0411) Weight:  [101.5 kg] 101.5 kg (05/01 0600) Last BM Date: 11/26/19  Weight change: Filed Weights   11/27/19 0302 11/28/19 0559 11/29/19 0600  Weight: 106 kg 108 kg 101.5 kg    Intake/Output:   Intake/Output Summary (Last 24 hours) at 11/29/2019 0727 Last data filed at 11/29/2019 0600 Gross per 24 hour  Intake 249.83 ml  Output 3950 ml  Net -3700.17 ml      Physical Exam    General:  Well appearing. No resp difficulty HEENT: Normal Neck: Supple. JVP 14 cm. Carotids 2+ bilat; no bruits. No lymphadenopathy or thyromegaly appreciated. Cor: PMI nondisplaced. Regular rate & rhythm. No rubs, gallops or murmurs. Lungs: Decreased at bases.  Abdomen: Soft, nontender, nondistended. No hepatosplenomegaly. No bruits or masses. Good bowel sounds. Extremities: No cyanosis, clubbing, rash. 1+ edema to knees.  Neuro: Alert & orientedx3, cranial nerves grossly intact. moves all 4 extremities w/o difficulty. Affect pleasant   Telemetry   A-paced in 70s.    Labs    CBC Recent Labs   11/28/19 0344 11/28/19 1502 11/28/19 1506 11/29/19 0427  WBC 4.3  --   --  3.7*  HGB 10.2*   < > 9.2* 10.4*  HCT 33.6*   < > 27.0* 34.5*  MCV 94.6  --   --  94.0  PLT 121*  --   --  116*   < > = values in this interval not displayed.   Basic Metabolic Panel Recent Labs    11/28/19 0344 11/28/19 1502 11/28/19 1506 11/29/19 0427  NA 140   < > 147* 144  K 4.1   < > 3.5 3.7  CL 106  --   --  105  CO2 26  --   --  29  GLUCOSE 101*  --   --  90  BUN 76*  --   --  71*  CREATININE 2.79*  --   --  2.46*  CALCIUM 8.9  --   --  9.2   < > = values in this interval not displayed.   Liver Function Tests No results for input(s): AST, ALT, ALKPHOS, BILITOT, PROT, ALBUMIN in the last 72 hours. No results for input(s): LIPASE, AMYLASE in the last 72 hours. Cardiac Enzymes No results for input(s): CKTOTAL, CKMB, CKMBINDEX, TROPONINI in the last 72 hours.  BNP: BNP (last 3 results) Recent Labs  11/22/19 1812  BNP 1,194.0*    ProBNP (last 3 results) No results for input(s): PROBNP in the last 8760 hours.   D-Dimer No results for input(s): DDIMER in the last 72 hours. Hemoglobin A1C Recent Labs    11/27/19 0241  HGBA1C 6.5*   Fasting Lipid Panel No results for input(s): CHOL, HDL, LDLCALC, TRIG, CHOLHDL, LDLDIRECT in the last 72 hours. Thyroid Function Tests No results for input(s): TSH, T4TOTAL, T3FREE, THYROIDAB in the last 72 hours.  Invalid input(s): FREET3  Other results:   Imaging    CARDIAC CATHETERIZATION  Result Date: 11/28/2019 Findings: RA = 16 RV = 59/16 PA = 56/26 (34) PCW = 24 (v = 34) Fick cardiac output/index = 8.3/3.8 Thermo CO/CI = 5.1/2.3 PVR = 2.0 (thermo) FA sat = 96% PA sat = 71%, 71% SVC sat = 70% Assessment: 1. Elevated filling pressures with normal output Plan/Discussion: Will increase lasix. Can add milrinone as needed. Leave swan in for 24-48 hours to follow hemodynamics. Glori Bickers, MD 3:42 PM   DG Chest Port 1 View  Result Date:  11/28/2019 CLINICAL DATA:  Shortness of breath. CHF? EXAM: PORTABLE CHEST 1 VIEW COMPARISON:  Chest x-ray dated 11/27/2019. FINDINGS: Stable cardiomegaly. LEFT chest wall pacemaker/ICD apparatus is stable. Persistent mild central pulmonary vascular congestion. Lungs are otherwise clear. No pleural effusion or pneumothorax is seen. IMPRESSION: Stable cardiomegaly. Persistent mild central pulmonary vascular congestion suggesting mild volume overload/CHF. No evidence of pneumonia or overt alveolar pulmonary edema. Electronically Signed   By: Franki Cabot M.D.   On: 11/28/2019 09:12      Medications:     Scheduled Medications: . alfuzosin  10 mg Oral Daily  . aspirin EC  81 mg Oral Daily  . atorvastatin  40 mg Oral QHS  . chlorhexidine  15 mL Mouth Rinse BID  . Chlorhexidine Gluconate Cloth  6 each Topical Daily  . furosemide  80 mg Intravenous BID  . insulin aspart  0-15 Units Subcutaneous TID WC  . ipratropium-albuterol  3 mL Nebulization Q6H  . mouth rinse  15 mL Mouth Rinse q12n4p  . metoprolol succinate  50 mg Oral Daily  . pantoprazole  40 mg Oral q AM  . pneumococcal 23 valent vaccine  0.5 mL Intramuscular Tomorrow-1000  . potassium chloride  40 mEq Oral Once  . sodium chloride flush  10-40 mL Intracatheter Q12H  . sodium chloride flush  3 mL Intravenous Q12H  . tamsulosin  0.4 mg Oral q AM     Infusions: . sodium chloride 10 mL/hr at 11/29/19 0155     PRN Medications:  sodium chloride, acetaminophen, nitroGLYCERIN, ondansetron (ZOFRAN) IV, sodium chloride flush   Assessment/Plan   1. NSTEMI with known CAD: [place] CAD and is s/p CABG in 2000 (LIMA to the LAD, saphenous vein graft to the OM1 and OM2 and posterior descending).  Low-risk NST in 2018 whopresented with worsening dyspnea on exertion, orthopnea and edema.  Troponin565>>506>>1120. No chest pain.  - Given NSTEMI and drop in EF, will need coronary/graft angiography when renal function permits - Heparin gtt  transitioned to [place] heparin.  - Continue ASA and statin.  - Add Plavix with NSTEMI.  2. AcutesystolicCHFexacerbation: Presented withworsening dyspnea on exertion, orthopnea and lower extremity edema for the past severalweeks. BNP was elevated [id number] showedincreased vascular congestion without edema and chronic trace pleural effusionson admission.  Echo 11/25/19 with LVEF at 30-35% and global hypokinesis (previous EF 52% in 2018), Moderate pulmonary hTN with PA pressure at 46.62mmHg.  Volume overloaded on RHC 4/30, filling pressures by Luiz Blare remain elevated today.  He diuresed well yesterday, creatinine trending down. Preserved cardiac output.  - ContinueLasix 80 mg IV bid with good response so far.  - Toprol XL 50 mg daily.  - No ARB/ARNI/spiro with AKI.  3. Acute hypoxic/hypercarbic respiratory failure: Placed on Bipap overnight with improvement, now back on [place].  4. AKI on CKD stage 3:  baseline 1.5-2.0 but as high as 3.9 in past. Creatinine 2.79 => 2.46.  Suspect some improvement now with lowering renal venous pressure.  - Continue Lasix as above.  5. Symptomatic Bradycardia:  s/pMedtronicPPM placement which is followed by Dr. Rayann Heman. Currently a-pacing.  - will need device interrogation to make sure that he does not have high burden of RV pacing (suspect this is unlikely as AV node has been ok) 6. HLD:  LDL 38 this admission  -Continue Atorvastatin 40mg  daily with goal LDL less than 70 with known CAD.  7. Anemia/Tarry Stools: Reported darker stools for 2 weeks leading to admission with occult blood being negative.Hgb stable - Will need to follow-up with GI as an outpatient.  8. Type 2 DM: SSIwhile inpatient status  CRITICAL CARE Performed by: Loralie Champagne  Total critical care time: 35 minutes  Critical care time was exclusive of separately billable procedures and treating other patients.  Critical care was necessary to treat or prevent imminent or life-threatening  deterioration.  Critical care was time spent personally by me on the following activities: development of treatment plan with patient and/or surrogate as well as nursing, discussions with consultants, evaluation of patient's response to treatment, examination of patient, obtaining history from patient or surrogate, ordering and performing treatments and interventions, ordering and review of laboratory studies, ordering and review of radiographic studies, pulse oximetry and re-evaluation of patient's condition.  Loralie Champagne 11/29/2019 7:35 AM

## 2019-11-29 NOTE — Progress Notes (Signed)
Pt taken off bipap at this time. Pt denies SOB, no increased WOB. VS within normal limits.

## 2019-11-30 DIAGNOSIS — I214 Non-ST elevation (NSTEMI) myocardial infarction: Secondary | ICD-10-CM | POA: Diagnosis not present

## 2019-11-30 DIAGNOSIS — I5043 Acute on chronic combined systolic (congestive) and diastolic (congestive) heart failure: Secondary | ICD-10-CM | POA: Diagnosis not present

## 2019-11-30 LAB — MAGNESIUM
Magnesium: 1.8 mg/dL (ref 1.7–2.4)
Magnesium: 1.7 mg/dL (ref 1.7–2.4)

## 2019-11-30 LAB — GLUCOSE, CAPILLARY
Glucose-Capillary: 111 mg/dL — ABNORMAL HIGH (ref 70–99)
Glucose-Capillary: 161 mg/dL — ABNORMAL HIGH (ref 70–99)
Glucose-Capillary: 130 mg/dL — ABNORMAL HIGH (ref 70–99)
Glucose-Capillary: 203 mg/dL — ABNORMAL HIGH (ref 70–99)

## 2019-11-30 LAB — BASIC METABOLIC PANEL
Anion gap: 10 (ref 5–15)
Anion gap: 10 (ref 5–15)
BUN: 72 mg/dL — ABNORMAL HIGH (ref 8–23)
BUN: 70 mg/dL — ABNORMAL HIGH (ref 8–23)
CO2: 31 mmol/L (ref 22–32)
CO2: 33 mmol/L — ABNORMAL HIGH (ref 22–32)
Calcium: 9.2 mg/dL (ref 8.9–10.3)
Calcium: 9.1 mg/dL (ref 8.9–10.3)
Chloride: 100 mmol/L (ref 98–111)
Chloride: 97 mmol/L — ABNORMAL LOW (ref 98–111)
Creatinine, Ser: 2.33 mg/dL — ABNORMAL HIGH (ref 0.61–1.24)
Creatinine, Ser: 2.65 mg/dL — ABNORMAL HIGH (ref 0.61–1.24)
GFR calc Af Amer: 30 mL/min — ABNORMAL LOW (ref >60)
GFR calc Af Amer: 26 mL/min — ABNORMAL LOW (ref >60)
GFR calc non Af Amer: 26 mL/min — ABNORMAL LOW (ref >60)
GFR calc non Af Amer: 22 mL/min — ABNORMAL LOW (ref >60)
Glucose, Bld: 140 mg/dL — ABNORMAL HIGH (ref 70–99)
Glucose, Bld: 228 mg/dL — ABNORMAL HIGH (ref 70–99)
Potassium: 3.8 mmol/L (ref 3.5–5.1)
Potassium: 3.9 mmol/L (ref 3.5–5.1)
Sodium: 141 mmol/L (ref 135–145)
Sodium: 140 mmol/L (ref 135–145)

## 2019-11-30 LAB — COOXEMETRY PANEL
Carboxyhemoglobin: 1.3 % (ref 0.5–1.5)
Methemoglobin: 1 % (ref 0.0–1.5)
O2 Saturation: 66.8 %
Total hemoglobin: 11.7 g/dL — ABNORMAL LOW (ref 12.0–16.0)

## 2019-11-30 LAB — CBC
HCT: 33.2 % — ABNORMAL LOW (ref 39.0–52.0)
Hemoglobin: 10.5 g/dL — ABNORMAL LOW (ref 13.0–17.0)
MCH: 28.8 pg (ref 26.0–34.0)
MCHC: 31.6 g/dL (ref 30.0–36.0)
MCV: 91.2 fL (ref 80.0–100.0)
Platelets: 120 10*3/uL — ABNORMAL LOW (ref 150–400)
RBC: 3.64 MIL/uL — ABNORMAL LOW (ref 4.22–5.81)
RDW: 14.6 % (ref 11.5–15.5)
WBC: 4.4 10*3/uL (ref 4.0–10.5)
nRBC: 0 % (ref 0.0–0.2)

## 2019-11-30 MED ORDER — POTASSIUM CHLORIDE CRYS ER 20 MEQ PO TBCR
40.0000 meq | EXTENDED_RELEASE_TABLET | Freq: Once | ORAL | Status: AC
  Administered 2019-11-30: 40 meq via ORAL
  Filled 2019-11-30: qty 2

## 2019-11-30 MED ORDER — MAGNESIUM SULFATE 2 GM/50ML IV SOLN
2.0000 g | Freq: Once | INTRAVENOUS | Status: AC
  Administered 2019-11-30: 2 g via INTRAVENOUS
  Filled 2019-11-30: qty 50

## 2019-11-30 MED ORDER — SPIRONOLACTONE 12.5 MG HALF TABLET
12.5000 mg | ORAL_TABLET | Freq: Every day | ORAL | Status: DC
  Administered 2019-11-30 – 2019-12-03 (×4): 12.5 mg via ORAL
  Filled 2019-11-30 (×4): qty 1

## 2019-11-30 NOTE — Progress Notes (Signed)
Patient ID: [name], adult   DOB: 1941/06/30, 79 y.o.   MRN: [id number]     Advanced Heart Failure Rounding Note  PCP-Cardiologist: Thompson Grayer, MD   Subjective:    Breathing better, no complaints.    Good diuresis overnight on Lasix 80 mg IV bid. Creatinine 2.79 => 2.46 => 2.33.   Swan numbers today: RA 10 PA 42/19 CI 2.4 Co-ox 67%  RHC (4/30):  RA = 16 RV = 59/16 PA = 56/26 (34) PCW = 24 (v = 34) Fick cardiac output/index = 8.3/3.8 Thermo CO/CI = 5.1/2.3 PVR = 2.0 (thermo) FA sat = 96% PA sat = 71%, 71% SVC sat = 70%  Objective:   Weight Range: 96.4 kg Body mass index is 33.29 kg/m.   Vital Signs:   Temp:  [98.2 F (36.8 C)-99.3 F (37.4 C)] 98.8 F (37.1 C) (05/02 0800) Pulse Rate:  [59-73] 62 (05/02 0800) Resp:  [12-23] 20 (05/02 0800) BP: (104-150)/(57-74) 104/65 (05/02 0800) SpO2:  [70 %-100 %] 93 % (05/02 0800) Weight:  [96.4 kg] 96.4 kg (05/02 0600) Last BM Date: 11/29/19  Weight change: Filed Weights   11/28/19 0559 11/29/19 0600 11/30/19 0600  Weight: 108 kg 101.5 kg 96.4 kg    Intake/Output:   Intake/Output Summary (Last 24 hours) at 11/30/2019 0832 Last data filed at 11/30/2019 0800 Gross per 24 hour  Intake 483 ml  Output 3050 ml  Net -2567 ml      Physical Exam    General: NAD Neck: JVP 8-9 cm, no thyromegaly or thyroid nodule.  Lungs: Clear to auscultation bilaterally with normal respiratory effort. CV: Nondisplaced PMI.  Heart regular S1/S2, no S3/S4, no murmur.  No peripheral edema.   Abdomen: Soft, nontender, no hepatosplenomegaly, no distention.  Skin: Intact without lesions or rashes.  Neurologic: Alert and oriented x 3.  Psych: Normal affect. Extremities: No clubbing or cyanosis.  HEENT: Normal.    Telemetry   A-paced in 70s.    Labs    CBC Recent Labs    11/29/19 0427 11/30/19 0003  WBC 3.7* 4.4  HGB 10.4* 10.5*  HCT 34.5* 33.2*  MCV 94.0 91.2  PLT 116* 233*   Basic Metabolic Panel Recent Labs      11/29/19 0427 11/30/19 0006  NA 144 141  K 3.7 3.8  CL 105 100  CO2 29 31  GLUCOSE 90 140*  BUN 71* 72*  CREATININE 2.46* 2.33*  CALCIUM 9.2 9.2  MG  --  1.8   Liver Function Tests No results for input(s): AST, ALT, ALKPHOS, BILITOT, PROT, ALBUMIN in the last 72 hours. No results for input(s): LIPASE, AMYLASE in the last 72 hours. Cardiac Enzymes No results for input(s): CKTOTAL, CKMB, CKMBINDEX, TROPONINI in the last 72 hours.  BNP: BNP (last 3 results) Recent Labs    11/22/19 1812  BNP 1,194.0*    ProBNP (last 3 results) No results for input(s): PROBNP in the last 8760 hours.   D-Dimer No results for input(s): DDIMER in the last 72 hours. Hemoglobin A1C No results for input(s): HGBA1C in the last 72 hours. Fasting Lipid Panel No results for input(s): CHOL, HDL, LDLCALC, TRIG, CHOLHDL, LDLDIRECT in the last 72 hours. Thyroid Function Tests No results for input(s): TSH, T4TOTAL, T3FREE, THYROIDAB in the last 72 hours.  Invalid input(s): FREET3  Other results:   Imaging    No results found.   Medications:     Scheduled Medications: . alfuzosin  10 mg Oral Daily  .  aspirin EC  81 mg Oral Daily  . atorvastatin  40 mg Oral QHS  . chlorhexidine  15 mL Mouth Rinse BID  . Chlorhexidine Gluconate Cloth  6 each Topical Daily  . clopidogrel  75 mg Oral Daily  . furosemide  80 mg Intravenous BID  . heparin injection (subcutaneous)  5,000 Units Subcutaneous Q8H  . insulin aspart  0-15 Units Subcutaneous TID WC  . mouth rinse  15 mL Mouth Rinse q12n4p  . metoprolol succinate  50 mg Oral Daily  . pantoprazole  40 mg Oral q AM  . pneumococcal 23 valent vaccine  0.5 mL Intramuscular Tomorrow-1000  . potassium chloride  40 mEq Oral Once  . sodium chloride flush  10-40 mL Intracatheter Q12H  . sodium chloride flush  3 mL Intravenous Q12H  . spironolactone  12.5 mg Oral Daily  . tamsulosin  0.4 mg Oral q AM    Infusions: . sodium chloride 10 mL/hr at  11/30/19 0800    PRN Medications: sodium chloride, acetaminophen, ipratropium-albuterol, nitroGLYCERIN, ondansetron (ZOFRAN) IV, sodium chloride flush   Assessment/Plan   1. NSTEMI with known CAD: [place] CAD and is s/p CABG in 2000 (LIMA to the LAD, saphenous vein graft to the OM1 and OM2 and posterior descending).  Low-risk NST in 2018 whopresented with worsening dyspnea on exertion, orthopnea and edema.  Troponin565>>506>>1120. No chest pain.  - Given NSTEMI and drop in EF, will need coronary/graft angiography when renal function permits.  - Heparin gtt transitioned to [place] heparin.  - Continue ASA and statin.  - Added Plavix with NSTEMI.  2. AcutesystolicCHFexacerbation: Presented withworsening dyspnea on exertion, orthopnea and lower extremity edema for the past severalweeks. BNP was elevated [id number] showedincreased vascular congestion without edema and chronic trace pleural effusionson admission.  Echo 11/25/19 with LVEF at 30-35% and global hypokinesis (previous EF 52% in 2018), Moderate pulmonary hTN with PA pressure at 46.69mmHg.  Volume overloaded on RHC 4/30, filling pressures by Luiz Blare improved today.  He diuresed well again yesterday, creatinine trending down. Preserved cardiac output. CVP 10.  - Will give 1 more dose of Lasix 80 mg IV this morning, then anticipate to po diuretic tomorrow am.  - Add spironolactone 12.5 daily.   - Toprol XL 50 mg daily.  - No ARB/ARNI/spiro with AKI.  - Remove Swan, can leave introducer to follow CVP.  3. Acute hypoxic/hypercarbic respiratory failure: Resolved, off Bipap and now on [place].   4. AKI on CKD stage 3:  baseline 1.5-2.0 but as high as 3.9 in past. Creatinine 2.79 => 2.46 => 2.33.  Suspect some improvement now with lowering renal venous pressure.   5. Symptomatic Bradycardia:  s/pMedtronicPPM placement which is followed by Dr. Rayann Heman. Currently a-pacing.  - will need device interrogation to make sure that he does not have  high burden of RV pacing (suspect this is unlikely as AV node has been ok) 6. HLD:  LDL 38 this admission  -Continue Atorvastatin 40mg  daily with goal LDL less than 70 with known CAD.  7. Anemia/Tarry Stools: Reported darker stools for 2 weeks leading to admission with occult blood being negative.Hgb stable - Will need to follow-up with GI as an outpatient.  8. Type 2 DM: SSIwhile inpatient status  Loralie Champagne 11/30/2019 8:32 AM

## 2019-11-30 NOTE — Plan of Care (Signed)
  Problem: Education: Goal: Knowledge of General Education information will improve Description: Including pain rating scale, medication(s)/side effects and non-pharmacologic comfort measures Outcome: Progressing   Problem: Health Behavior/Discharge Planning: Goal: Ability to manage health-related needs will improve Outcome: Progressing   Problem: Clinical Measurements: Goal: Ability to maintain clinical measurements within normal limits will improve Outcome: Progressing Goal: Will remain free from infection Outcome: Progressing Goal: Diagnostic test results will improve Outcome: Progressing Goal: Respiratory complications will improve Outcome: Progressing Goal: Cardiovascular complication will be avoided Outcome: Progressing   Problem: Activity: Goal: Risk for activity intolerance will decrease Outcome: Progressing   Problem: Nutrition: Goal: Adequate nutrition will be maintained Outcome: Progressing   Problem: Coping: Goal: Level of anxiety will decrease Outcome: Progressing   Problem: Elimination: Goal: Will not experience complications related to bowel motility Outcome: Progressing Goal: Will not experience complications related to urinary retention Outcome: Progressing   Problem: Pain Managment: Goal: General experience of comfort will improve Outcome: Progressing   Problem: Safety: Goal: Ability to remain free from injury will improve Outcome: Progressing   Problem: Skin Integrity: Goal: Risk for impaired skin integrity will decrease Outcome: Progressing   Problem: Education: Goal: Understanding of cardiac disease, CV risk reduction, and recovery process will improve Outcome: Progressing Goal: Understanding of medication regimen will improve Outcome: Progressing   Problem: Activity: Goal: Ability to tolerate increased activity will improve Outcome: Progressing   Problem: Cardiac: Goal: Ability to achieve and maintain adequate cardiopulmonary  perfusion will improve Outcome: Progressing Goal: Vascular access site(s) Level 0-1 will be maintained Outcome: Progressing   Problem: Health Behavior/Discharge Planning: Goal: Ability to safely manage health-related needs after discharge will improve Outcome: Progressing

## 2019-11-30 NOTE — Progress Notes (Signed)
Paged cardiology regarding increase in ectopy. Pt looked to be in bigeminal rhythm, but was asymptomatic. Labs ordered via verbal order.

## 2019-12-01 ENCOUNTER — Inpatient Hospital Stay (HOSPITAL_COMMUNITY): Payer: Medicare Other

## 2019-12-01 DIAGNOSIS — I5021 Acute systolic (congestive) heart failure: Secondary | ICD-10-CM | POA: Diagnosis not present

## 2019-12-01 DIAGNOSIS — I214 Non-ST elevation (NSTEMI) myocardial infarction: Secondary | ICD-10-CM | POA: Diagnosis not present

## 2019-12-01 DIAGNOSIS — I2510 Atherosclerotic heart disease of native coronary artery without angina pectoris: Secondary | ICD-10-CM | POA: Diagnosis not present

## 2019-12-01 LAB — BASIC METABOLIC PANEL
Anion gap: 10 (ref 5–15)
BUN: 69 mg/dL — ABNORMAL HIGH (ref 8–23)
CO2: 33 mmol/L — ABNORMAL HIGH (ref 22–32)
Calcium: 9.1 mg/dL (ref 8.9–10.3)
Chloride: 96 mmol/L — ABNORMAL LOW (ref 98–111)
Creatinine, Ser: 2.56 mg/dL — ABNORMAL HIGH (ref 0.61–1.24)
GFR calc Af Amer: 27 mL/min — ABNORMAL LOW (ref >60)
GFR calc non Af Amer: 23 mL/min — ABNORMAL LOW (ref >60)
Glucose, Bld: 290 mg/dL — ABNORMAL HIGH (ref 70–99)
Potassium: 3.3 mmol/L — ABNORMAL LOW (ref 3.5–5.1)
Sodium: 139 mmol/L (ref 135–145)

## 2019-12-01 LAB — COOXEMETRY PANEL
Carboxyhemoglobin: 1.2 % (ref 0.5–1.5)
Methemoglobin: 1 % (ref 0.0–1.5)
O2 Saturation: 67.1 %
Total hemoglobin: 10.8 g/dL — ABNORMAL LOW (ref 12.0–16.0)

## 2019-12-01 LAB — NM MYOCAR MULTI W/SPECT W/WALL MOTION / EF
Estimated workload: 1 METS
Exercise duration (min): 0 min
Exercise duration (sec): 0 s
MPHR: 142 {beats}/min
Peak HR: 70 {beats}/min
Percent HR: 49 %
RPE: 0
Rest HR: 68 {beats}/min

## 2019-12-01 LAB — GLUCOSE, CAPILLARY
Glucose-Capillary: 130 mg/dL — ABNORMAL HIGH (ref 70–99)
Glucose-Capillary: 186 mg/dL — ABNORMAL HIGH (ref 70–99)
Glucose-Capillary: 199 mg/dL — ABNORMAL HIGH (ref 70–99)
Glucose-Capillary: 200 mg/dL — ABNORMAL HIGH (ref 70–99)

## 2019-12-01 MED ORDER — TECHNETIUM TC 99M TETROFOSMIN IV KIT
10.0000 | PACK | Freq: Once | INTRAVENOUS | Status: AC | PRN
  Administered 2019-12-01: 10 via INTRAVENOUS

## 2019-12-01 MED ORDER — REGADENOSON 0.4 MG/5ML IV SOLN
0.4000 mg | Freq: Once | INTRAVENOUS | Status: AC
  Filled 2019-12-01: qty 5

## 2019-12-01 MED ORDER — TECHNETIUM TC 99M TETROFOSMIN IV KIT
30.0000 | PACK | Freq: Once | INTRAVENOUS | Status: AC | PRN
  Administered 2019-12-01: 30 via INTRAVENOUS

## 2019-12-01 MED ORDER — REGADENOSON 0.4 MG/5ML IV SOLN
INTRAVENOUS | Status: AC
  Administered 2019-12-01: 0.4 mg via INTRAVENOUS
  Filled 2019-12-01: qty 5

## 2019-12-01 MED ORDER — POTASSIUM CHLORIDE CRYS ER 20 MEQ PO TBCR
40.0000 meq | EXTENDED_RELEASE_TABLET | Freq: Once | ORAL | Status: AC
  Administered 2019-12-01: 18:00:00 40 meq via ORAL
  Filled 2019-12-01: qty 2

## 2019-12-01 NOTE — Progress Notes (Signed)
RT placed patient on CPAP (V60). Patient tolerating well at this time. RT will monitor as needed.

## 2019-12-01 NOTE — Progress Notes (Signed)
lexiscan myoview stress portion completed without complications.  Results to follow

## 2019-12-01 NOTE — Progress Notes (Addendum)
Patient ID: [name], adult   DOB: 06/03/1941, 79 y.o.   MRN: [id number]     Advanced Heart Failure Rounding Note  PCP-Cardiologist: Thompson Grayer, MD   Subjective:    Continues to diurese well w/ IV Lasix. - 3.3 L out yesterday. SCr however trending back up, 2.33>>2.65.   Co-ox 67%. CVP 8-9   Denies CP. No resting dyspnea. OOB sitting in chair.   RHC (4/30):  RA = 16 RV = 59/16 PA = 56/26 (34) PCW = 24 (v = 34) Fick cardiac output/index = 8.3/3.8 Thermo CO/CI = 5.1/2.3 PVR = 2.0 (thermo) FA sat = 96% PA sat = 71%, 71% SVC sat = 70%  Objective:   Weight Range: 96.1 kg Body mass index is 33.18 kg/m.   Vital Signs:   Temp:  [98 F (36.7 C)-98.9 F (37.2 C)] 98 F (36.7 C) (05/03 0752) Pulse Rate:  [56-126] 71 (05/03 0700) Resp:  [10-23] 18 (05/03 0700) BP: (88-131)/(51-86) 110/61 (05/03 0700) SpO2:  [88 %-98 %] 97 % (05/03 0700) Weight:  [96.1 kg] 96.1 kg (05/03 0500) Last BM Date: 11/30/19  Weight change: Filed Weights   11/29/19 0600 11/30/19 0600 12/01/19 0500  Weight: 101.5 kg 96.4 kg 96.1 kg    Intake/Output:   Intake/Output Summary (Last 24 hours) at 12/01/2019 0821 Last data filed at 12/01/2019 0752 Gross per 24 hour  Intake 394.91 ml  Output 3300 ml  Net -2905.09 ml      Physical Exam    PHYSICAL EXAM: CVP 8-9  General:  Well appearing, moderately obese elderly WM. No respiratory difficulty HEENT: normal Neck: supple. no JVD. Carotids 2+ bilat; no bruits. No lymphadenopathy or thyromegaly appreciated. Cor: PMI nondisplaced. Regular rate & rhythm. No rubs, gallops or murmurs. Lungs: clear Abdomen: soft, nontender, nondistended. No hepatosplenomegaly. No bruits or masses. Good bowel sounds. Extremities: no cyanosis, clubbing, rash, edema + RUE PICC  Neuro: alert & oriented x 3, cranial nerves grossly intact. moves all 4 extremities w/o difficulty. Affect pleasant.    Telemetry   A-paced in 64s. Personally reviewed    Labs     CBC Recent Labs    11/29/19 0427 11/30/19 0003  WBC 3.7* 4.4  HGB 10.4* 10.5*  HCT 34.5* 33.2*  MCV 94.0 91.2  PLT 116* 465*   Basic Metabolic Panel Recent Labs    11/30/19 0006 11/30/19 2002 11/30/19 2005  NA 141 140  --   K 3.8 3.9  --   CL 100 97*  --   CO2 31 33*  --   GLUCOSE 140* 228*  --   BUN 72* 70*  --   CREATININE 2.33* 2.65*  --   CALCIUM 9.2 9.1  --   MG 1.8  --  1.7   Liver Function Tests No results for input(s): AST, ALT, ALKPHOS, BILITOT, PROT, ALBUMIN in the last 72 hours. No results for input(s): LIPASE, AMYLASE in the last 72 hours. Cardiac Enzymes No results for input(s): CKTOTAL, CKMB, CKMBINDEX, TROPONINI in the last 72 hours.  BNP: BNP (last 3 results) Recent Labs    11/22/19 1812  BNP 1,194.0*    ProBNP (last 3 results) No results for input(s): PROBNP in the last 8760 hours.   D-Dimer No results for input(s): DDIMER in the last 72 hours. Hemoglobin A1C No results for input(s): HGBA1C in the last 72 hours. Fasting Lipid Panel No results for input(s): CHOL, HDL, LDLCALC, TRIG, CHOLHDL, LDLDIRECT in the last 72 hours. Thyroid Function Tests No  results for input(s): TSH, T4TOTAL, T3FREE, THYROIDAB in the last 72 hours.  Invalid input(s): FREET3  Other results:   Imaging    No results found.   Medications:     Scheduled Medications: . alfuzosin  10 mg Oral Daily  . aspirin EC  81 mg Oral Daily  . atorvastatin  40 mg Oral QHS  . chlorhexidine  15 mL Mouth Rinse BID  . Chlorhexidine Gluconate Cloth  6 each Topical Daily  . clopidogrel  75 mg Oral Daily  . furosemide  80 mg Intravenous BID  . heparin injection (subcutaneous)  5,000 Units Subcutaneous Q8H  . insulin aspart  0-15 Units Subcutaneous TID WC  . mouth rinse  15 mL Mouth Rinse q12n4p  . metoprolol succinate  50 mg Oral Daily  . pantoprazole  40 mg Oral q AM  . pneumococcal 23 valent vaccine  0.5 mL Intramuscular Tomorrow-1000  . sodium chloride flush  10-40  mL Intracatheter Q12H  . sodium chloride flush  3 mL Intravenous Q12H  . spironolactone  12.5 mg Oral Daily  . tamsulosin  0.4 mg Oral q AM    Infusions: . sodium chloride Stopped (11/30/19 2131)    PRN Medications: sodium chloride, acetaminophen, ipratropium-albuterol, nitroGLYCERIN, ondansetron (ZOFRAN) IV, sodium chloride flush   Assessment/Plan   1. NSTEMI with known CAD: [place] CAD and is s/p CABG in 2000 (LIMA to the LAD, saphenous vein graft to the OM1 and OM2 and posterior descending).  Low-risk NST in 2018 whopresented with worsening dyspnea on exertion, orthopnea and edema.  Troponin565>>506>>1120. No chest pain.  - Given NSTEMI and drop in EF, ideally would opt for coronary/graft angiography. However due to elevated SCr, will plan for NST first today to assess for ischemia - Heparin gtt transitioned to [place] heparin.  - Continue ASA and statin.  - Added Plavix with NSTEMI.  - Consult Cardiac Rehab to ambulate  2. AcutesystolicCHFexacerbation: Presented withworsening dyspnea on exertion, orthopnea and lower extremity edema for the past severalweeks. BNP was elevated [id number] showedincreased vascular congestion without edema and chronic trace pleural effusionson admission.  Echo 11/25/19 with LVEF at 30-35% and global hypokinesis (previous EF 52% in 2018), Moderate pulmonary hTN with PA pressure at 46.23mmHg.  Volume overloaded on RHC 4/30, filling pressures by Luiz Blare improved today.  He diuresed well again yesterday, creatinine trending down. Preserved cardiac output. CVP 8-9. Co-ox 67% - Hold Lasix for now given bump in SCr - Continue spironolactone 12.5 daily.   - Toprol XL 50 mg daily.  - No ARB/ARNI/spiro with AKI.  - Continue to follow CVP    3. Acute hypoxic/hypercarbic respiratory failure: Resolved, off Bipap and now on [place].    4. AKI on CKD stage 3:  baseline 1.5-2.0 but as high as 3.9 in past. Creatinine 2.79 => 2.46 => 2.33=>2.65. - Hold lasix today.  Avoid nephrotoxic agents - follow BMP daily   5. Symptomatic Bradycardia:  s/pMedtronicPPM placement which is followed by Dr. Rayann Heman. Currently a-pacing.  - will need device interrogation to make sure that he does not have high burden of RV pacing (suspect this is unlikely as AV node has been ok). Will ask MDT rep to interrogate.   6. HLD:  LDL 38 this admission  -Continue Atorvastatin 40mg  daily with goal LDL less than 70 with known CAD.   7. Anemia/Tarry Stools: Reported darker stools for 2 weeks leading to admission with occult blood being negative.Hgb stable - Will need to follow-up with GI as an outpatient.  8. Type 2 DM: SSIwhile inpatient status  Will transfer out of ICU to progressive care unit. Can go to [place] PA-C  12/01/2019 8:21 AM  Patient seen and examined with the above-signed Advanced Practice Provider and/or Housestaff. I personally reviewed laboratory data, imaging studies and relevant notes. I independently examined the patient and formulated the important aspects of the plan. I have edited the note to reflect any of my changes or salient points. I have personally discussed the plan with the patient and/or family.  Volume status much improved with IV lasix over the weekend. CVP down to 8. Co-ox looks good at 69%. Creatinine now trending back up.   Denies CP or SOB. A-paced on tele  General:  Elderly . No resp difficulty HEENT: normal Neck: supple. JVP 7-8. Carotids 2+ bilat; no bruits. No lymphadenopathy or thryomegaly appreciated. Cor: PMI nondisplaced. Regular rate & rhythm. No rubs, gallops or murmurs. Lungs: clear Abdomen: soft, nontender, nondistended. No hepatosplenomegaly. No bruits or masses. Good bowel sounds. Extremities: no cyanosis, clubbing, rash, edema Neuro: alert & orientedx3, cranial nerves grossly intact. moves all 4 extremities w/o difficulty. Affect pleasant  With NSTEMI and drop in EF would ideally have cath but creatinine  up. Will plan lexiscan/Myoview to assess territory involved and ischemic burden. If high ischemic burden in LAD territory may need to consider cath with limited contrast exposure. Hold lasix. CR to see.   Glori Bickers, MD  3:40 PM

## 2019-12-01 NOTE — Progress Notes (Signed)
Physical Therapy Treatment Patient Details Name: [name] MRN: [id number] DOB: [birth year] Today's Date: 12/01/2019    History of Present Illness 79 y.o. adult with past medical history of CAD (s/p CABG in 2000), symptomatic bradycardia (s/p PPM placement), HTN, HLD, Type 2 DM, OSA and prostate cancer who presented to [facility] ED on 11/22/2019 for evaluation of fatigue and tarry stools. Transferred to [employer] [facility] . Pt with CHF and NSTEMI    PT Comments    Pt sitting on BSc on arrival after toileting and bathing with nursing staff. Pt able to stand without physical assist and increase ambulation distance. Pt reports at baseline his most active day is going to the grocery store where he leans on the cart and finds benches in store to sit and rest and he does this grossly 1x every 2 weeks. Pt educated for gait progression and use of DME. Activity limited by toileting needs with pt on [facility] with RN end of session.  HR 74-76 with gait SpO2 95% on 2L    Follow Up Recommendations  Home health PT;Supervision/Assistance - 24 hour     Equipment Recommendations  None recommended by PT    Recommendations for Other Services       Precautions / Restrictions Precautions Precautions: Fall    Mobility  Bed Mobility               General bed mobility comments: pt on BSC beginning and end of session  Transfers Overall transfer level: Needs assistance   Transfers: Sit to/from Stand Sit to Stand: Min guard         General transfer comment: cues for hand placement with pt standing from BSC x 2 and chair x 1  Ambulation/Gait Ambulation/Gait assistance: Min guard Gait Distance (Feet): 95 Feet Assistive device: Rolling walker (2 wheeled) Gait Pattern/deviations: Step-through pattern;Trunk flexed;Decreased stride length   Gait velocity interpretation: 1.31 - 2.62 ft/sec, indicative of limited community ambulator General Gait Details: cues for posture and proximity to RW. pt with  back pain and kyphosis at baseline. Pt walked 95' x 2 trials with seated rest between, limited by fatigue   Stairs             Wheelchair Mobility    Modified Rankin (Stroke Patients Only)       Balance Overall balance assessment: Mild deficits observed, not formally tested                                          Cognition Arousal/Alertness: Awake/alert Behavior During Therapy: WFL for tasks assessed/performed Overall Cognitive Status: Within Functional Limits for tasks assessed                                        Exercises      General Comments        Pertinent Vitals/Pain Pain Assessment: No/denies pain    Home Living                      Prior Function            PT Goals (current goals can now be found in the care plan section) Progress towards PT goals: Progressing toward goals    Frequency  PT Plan Current plan remains appropriate    Co-evaluation              AM-PAC PT "6 Clicks" Mobility   Outcome Measure  Help needed turning from your back to your side while in a flat bed without using bedrails?: None Help needed moving from lying on your back to sitting on the side of a flat bed without using bedrails?: A Little Help needed moving to and from a bed to a chair (including a wheelchair)?: A Little Help needed standing up from a chair using your arms (e.g., wheelchair or bedside chair)?: A Little Help needed to walk in hospital room?: A Little Help needed climbing 3-5 steps with a railing? : A Lot 6 Click Score: 18    End of Session Equipment Utilized During Treatment: Gait belt;Oxygen Activity Tolerance: Patient tolerated treatment well Patient left: with call bell/phone within reach;with family/visitor present;with nursing/sitter in room;Other (comment)(on BSC) Nurse Communication: Mobility status PT Visit Diagnosis: Unsteadiness on feet (R26.81);Other abnormalities of  gait and mobility (R26.89);Muscle weakness (generalized) (M62.81);Difficulty in walking, not elsewhere classified (R26.2)     Time: 0100-7121 PT Time Calculation (min) (ACUTE ONLY): 22 min  Charges:  $Gait Training: 8-22 mins                     Bayard Males, PT Acute Rehabilitation Services Pager: [phone] Office: [place] 12/01/2019, 12:42 PM

## 2019-12-02 DIAGNOSIS — I5021 Acute systolic (congestive) heart failure: Secondary | ICD-10-CM | POA: Diagnosis not present

## 2019-12-02 LAB — COOXEMETRY PANEL
Carboxyhemoglobin: 1.2 % (ref 0.5–1.5)
Methemoglobin: 1 % (ref 0.0–1.5)
O2 Saturation: 68 %
Total hemoglobin: 11 g/dL — ABNORMAL LOW (ref 12.0–16.0)

## 2019-12-02 LAB — BASIC METABOLIC PANEL
Anion gap: 10 (ref 5–15)
BUN: 66 mg/dL — ABNORMAL HIGH (ref 8–23)
CO2: 34 mmol/L — ABNORMAL HIGH (ref 22–32)
Calcium: 8.9 mg/dL (ref 8.9–10.3)
Chloride: 96 mmol/L — ABNORMAL LOW (ref 98–111)
Creatinine, Ser: 2.36 mg/dL — ABNORMAL HIGH (ref 0.61–1.24)
GFR calc Af Amer: 29 mL/min — ABNORMAL LOW (ref >60)
GFR calc non Af Amer: 25 mL/min — ABNORMAL LOW (ref >60)
Glucose, Bld: 170 mg/dL — ABNORMAL HIGH (ref 70–99)
Potassium: 3.7 mmol/L (ref 3.5–5.1)
Sodium: 140 mmol/L (ref 135–145)

## 2019-12-02 LAB — GLUCOSE, CAPILLARY
Glucose-Capillary: 141 mg/dL — ABNORMAL HIGH (ref 70–99)
Glucose-Capillary: 235 mg/dL — ABNORMAL HIGH (ref 70–99)
Glucose-Capillary: 214 mg/dL — ABNORMAL HIGH (ref 70–99)
Glucose-Capillary: 170 mg/dL — ABNORMAL HIGH (ref 70–99)

## 2019-12-02 MED ORDER — ISOSORBIDE MONONITRATE ER 30 MG PO TB24
30.0000 mg | ORAL_TABLET | Freq: Every day | ORAL | Status: DC
  Administered 2019-12-02 – 2019-12-03 (×2): 30 mg via ORAL
  Filled 2019-12-02 (×2): qty 1

## 2019-12-02 MED ORDER — PNEUMOCOCCAL VAC POLYVALENT 25 MCG/0.5ML IJ INJ
0.5000 mL | INJECTION | INTRAMUSCULAR | Status: DC | PRN
  Filled 2019-12-02: qty 0.5

## 2019-12-02 MED ORDER — FUROSEMIDE 40 MG PO TABS
60.0000 mg | ORAL_TABLET | Freq: Every day | ORAL | Status: DC
  Administered 2019-12-02 – 2019-12-03 (×2): 60 mg via ORAL
  Filled 2019-12-02 (×2): qty 1

## 2019-12-02 MED ORDER — POTASSIUM CHLORIDE CRYS ER 20 MEQ PO TBCR
40.0000 meq | EXTENDED_RELEASE_TABLET | Freq: Once | ORAL | Status: AC
  Administered 2019-12-02: 40 meq via ORAL
  Filled 2019-12-02: qty 2

## 2019-12-02 MED ORDER — HYDRALAZINE HCL 25 MG PO TABS
12.5000 mg | ORAL_TABLET | Freq: Three times a day (TID) | ORAL | Status: DC
  Administered 2019-12-02 – 2019-12-03 (×3): 12.5 mg via ORAL
  Filled 2019-12-02 (×3): qty 1

## 2019-12-02 NOTE — Progress Notes (Signed)
CARDIAC REHAB PHASE I   PRE:  Rate/Rhythm: 74 pacing    BP: sitting 117/65    SaO2: 93 RA  MODE:  Ambulation: 190 ft   POST:  Rate/Rhythm: 87 pacing    BP: sitting 126/76     SaO2: 86 RA, up to 93 RA quickly with rest  Pt stood slowly independently. Ambulated with rollator, which is his norm. Fatigues with distance. Had to sit to rest at half way. Attempted to get SaO2 but would not register. Return to room and SAO2 86 RA, then 88 RA then up to 93 RA, all with good pleth. Gave pt IS to work on, performing 1500 mL. Left materials for pt to read. Will f/u tomorrow. [id number]   [place], ACSM 12/02/2019 10:51 AM

## 2019-12-02 NOTE — Progress Notes (Signed)
Inpatient Diabetes Program Recommendations  AACE/ADA: New Consensus Statement on Inpatient Glycemic Control (2015)  Target Ranges:  Prepandial:   less than 140 mg/dL      Peak postprandial:   less than 180 mg/dL (1-2 hours)      Critically ill patients:  140 - 180 mg/dL   Results for ROMEL, DUMOND (MRN [id number]) as of 12/02/2019 12:52  Ref. Range 12/01/2019 06:05 12/01/2019 11:31 12/01/2019 18:08 12/01/2019 21:59  Glucose-Capillary Latest Ref Range: 70 - 99 mg/dL 130 (H)  2 units NOVOLOG  186 (H)  3 units NOVOLOG  199 (H)  3 units NOVOLOG  200 (H)   Results for GAMBLE, ENDERLE (MRN [id number]) as of 12/02/2019 12:52  Ref. Range 12/02/2019 06:46 12/02/2019 11:39  Glucose-Capillary Latest Ref Range: 70 - 99 mg/dL 141 (H)  2 units NOVOLOG  235 (H)  5 units NOVOLOG      Home DM Meds: Amaryl 2 mg Daily       Actos 30 mg Daily   Current Orders: Novolog Moderate Correction Scale/ SSI (0-15 units) TID AC    MD- Please consider adding low dose Novolog Meal Coverage:   Novolog 3 units TID with meals  (Please add the following Hold Parameters: Hold if pt eats <50% of meal, Hold if pt NPO)     --Will follow patient during hospitalization--  Wyn Quaker RN, MSN, CDE Diabetes Coordinator Inpatient Glycemic Control Team Team Pager: [phone] (8a-5p)

## 2019-12-02 NOTE — Progress Notes (Addendum)
Patient ID: Francoise Schaumann, adult   DOB: 05-Sep-1940, 79 y.o.   MRN: [id number]     Advanced Heart Failure Rounding Note  PCP-Cardiologist: Thompson Grayer, MD   Subjective:    NST 12/01/19   Lexiscan stress: Electrically negative for ischemia  Myovue scan with large defect in the anterolateral (base,mid, distal), inferolateral (base, mid, distal)and inferior (base, mid) walls that improves minimally in inferior mid region consistent with large area of scar with minimal periinfarct ischemia. Small defect in the anterior mid region that improves consistent with a small region of mild ischemia.  LVEF calculated at 28% with diffuse hypokinesis.  High risk scan  Compared to myovue scan from 2018, anterolateral and anterior changes are new and LVEF is worse.    Lasix held last night. Remains on [place]. SCr improving 2.65>2.56>>2.36. -1L in UOP yesterday.    Volume status ok. CVP 8. Co-ox 68%.   Denies CP. No dyspnea. OOB and sitting in chair. No complaints.     RHC (4/30):  RA = 16 RV = 59/16 PA = 56/26 (34) PCW = 24 (v = 34) Fick cardiac output/index = 8.3/3.8 Thermo CO/CI = 5.1/2.3 PVR = 2.0 (thermo) FA sat = 96% PA sat = 71%, 71% SVC sat = 70%  Objective:   Weight Range: 96.5 kg Body mass index is 33.32 kg/m.   Vital Signs:   Temp:  [98 F (36.7 C)-99.1 F (37.3 C)] 98.8 F (37.1 C) (05/04 0757) Pulse Rate:  [50-70] 66 (05/04 0600) Resp:  [10-22] 21 (05/04 0600) BP: (95-141)/(56-101) 121/75 (05/04 0500) SpO2:  [95 %-99 %] 98 % (05/04 0600) FiO2 (%):  [30 %] 30 % (05/04 0315) Weight:  [96.5 kg] 96.5 kg (05/04 0500) Last BM Date: 11/30/19  Weight change: Filed Weights   11/30/19 0600 12/01/19 0500 12/02/19 0500  Weight: 96.4 kg 96.1 kg 96.5 kg    Intake/Output:   Intake/Output Summary (Last 24 hours) at 12/02/2019 0858 Last data filed at 12/02/2019 0600 Gross per 24 hour  Intake 20 ml  Output 1000 ml  Net -980 ml      Physical Exam    PHYSICAL  EXAM: CVP 8 General:  Well appearing, moderately obese elderly WM sitting up in chair. No respiratory difficulty HEENT: normal Neck: supple. no JVD.  + Rt IJ CVC, Carotids 2+ bilat; no bruits. No lymphadenopathy or thyromegaly appreciated. Cor: PMI nondisplaced. Regular rate & rhythm. No rubs, gallops or murmurs. Lungs: clear, no wheezing  Abdomen: soft, nontender, nondistended. No hepatosplenomegaly. No bruits or masses. Good bowel sounds. Extremities: no cyanosis, clubbing, rash, edema Neuro: alert & oriented x 3, cranial nerves grossly intact. moves all 4 extremities w/o difficulty. Affect pleasant.    Telemetry   A-paced 69 bpm Personally reviewed  Labs    CBC Recent Labs    11/30/19 0003  WBC 4.4  HGB 10.5*  HCT 33.2*  MCV 91.2  PLT 967*   Basic Metabolic Panel Recent Labs    11/30/19 0006 11/30/19 2002 11/30/19 2005 12/01/19 0900 12/02/19 0303  NA 141   < >  --  139 140  K 3.8   < >  --  3.3* 3.7  CL 100   < >  --  96* 96*  CO2 31   < >  --  33* 34*  GLUCOSE 140*   < >  --  290* 170*  BUN 72*   < >  --  69* 66*  CREATININE 2.33*   < >  --  2.56* 2.36*  CALCIUM 9.2   < >  --  9.1 8.9  MG 1.8  --  1.7  --   --    < > = values in this interval not displayed.   Liver Function Tests No results for input(s): AST, ALT, ALKPHOS, BILITOT, PROT, ALBUMIN in the last 72 hours. No results for input(s): LIPASE, AMYLASE in the last 72 hours. Cardiac Enzymes No results for input(s): CKTOTAL, CKMB, CKMBINDEX, TROPONINI in the last 72 hours.  BNP: BNP (last 3 results) Recent Labs    11/22/19 1812  BNP 1,194.0*    ProBNP (last 3 results) No results for input(s): PROBNP in the last 8760 hours.   D-Dimer No results for input(s): DDIMER in the last 72 hours. Hemoglobin A1C No results for input(s): HGBA1C in the last 72 hours. Fasting Lipid Panel No results for input(s): CHOL, HDL, LDLCALC, TRIG, CHOLHDL, LDLDIRECT in the last 72 hours. Thyroid Function  Tests No results for input(s): TSH, T4TOTAL, T3FREE, THYROIDAB in the last 72 hours.  Invalid input(s): FREET3  Other results:   Imaging    NM Myocar Multi W/Spect W/Wall Motion / EF  Result Date: 12/01/2019  Lexiscan stress: Electrically negative for ischemia  Myovue scan with large defect in the anterolateral (base,mid, distal), inferolateral (base, mid, distal)and inferior (base, mid) walls that improves minimally in inferior mid region consistent with large area of scar with minimal periinfarct ischemia. Small defect in the anterior mid region that improves consistent with a small region of mild ischemia.  LVEF calculated at 28% with diffuse hypokinesis.  High risk scan  Compared to myovue scan from 2018, anterolateral and anterior changes are new and LVEF is worse.      Medications:     Scheduled Medications: . alfuzosin  10 mg Oral Daily  . aspirin EC  81 mg Oral Daily  . atorvastatin  40 mg Oral QHS  . chlorhexidine  15 mL Mouth Rinse BID  . Chlorhexidine Gluconate Cloth  6 each Topical Daily  . clopidogrel  75 mg Oral Daily  . heparin injection (subcutaneous)  5,000 Units Subcutaneous Q8H  . insulin aspart  0-15 Units Subcutaneous TID WC  . mouth rinse  15 mL Mouth Rinse q12n4p  . metoprolol succinate  50 mg Oral Daily  . pantoprazole  40 mg Oral q AM  . pneumococcal 23 valent vaccine  0.5 mL Intramuscular Tomorrow-1000  . sodium chloride flush  10-40 mL Intracatheter Q12H  . sodium chloride flush  3 mL Intravenous Q12H  . spironolactone  12.5 mg Oral Daily  . tamsulosin  0.4 mg Oral q AM    Infusions: . sodium chloride Stopped (11/30/19 2131)    PRN Medications: sodium chloride, acetaminophen, ipratropium-albuterol, nitroGLYCERIN, ondansetron (ZOFRAN) IV, sodium chloride flush   Assessment/Plan   1. NSTEMI with known CAD: [place] CAD and is s/p CABG in 2000 (LIMA to the LAD, saphenous vein graft to the OM1 and OM2 and posterior descending).  Low-risk  NST in 2018 whopresented with worsening dyspnea on exertion, orthopnea and edema.  Troponin565>>506>>1120. No chest pain.  - Given NSTEMI and drop in EF, ideally would opt for coronary/graft angiography. However due to elevated SCr, had NST for evaluation, showing large defect in the anterolateral (base,mid, distal), inferolateral (base, mid, distal)and inferior (base, mid) walls that improves minimally in inferior mid region consistent with large area of scar with minimal periinfarct ischemia. Small defect in the anterior mid region that improves consistent with a small region of  mild ischemia. EF 28% - Suspect he likely has occluded his SVG- OM but, given small area of ischemia, risk of LHC outweighs benefit. Will plan to treat medically.  - Continue ASA, Plavix, Statin  blocker and spironolactone. No ARB w/ CKD.  - Ambulate w/ CR today   2. AcutesystolicCHFexacerbation: Presented withworsening dyspnea on exertion, orthopnea and lower extremity edema for the past severalweeks. BNP was elevated [id number] showedincreased vascular congestion without edema and chronic trace pleural effusionson admission.  Echo 11/25/19 with LVEF at 30-35% and global hypokinesis (previous EF 52% in 2018), Moderate pulmonary hTN with PA pressure at 46.20mmHg.  Volume overloaded on RHC 4/30 w/ preserved cardiac output.  Volume status improved w/ diuresis.  CVP 8. Co-ox 68% - Restart PO diuretics, 60 mg Lasix daily  - Continue spironolactone 12.5 daily.   - Continue Toprol XL 50 mg daily.  - No ARB/ARNI/spiro with AKI.  - Add hydralazine 12.5 mg daily  - Add Imdur 30 mg daily    3. Acute hypoxic/hypercarbic respiratory failure: Resolved, off Bipap and now on [place].    4. AKI on CKD stage 3:  baseline 1.5-2.0 but as high as 3.9 in past. Creatinine 2.79 => 2.46 => 2.33=>2.65=>2,56=>2.36 - resume PO lasix, 60 mg daily  - repeat BMP in AM  5. Symptomatic Bradycardia:  s/pMedtronicPPM placement which is  followed by Dr. Rayann Heman. Currently a-pacing.  - will need device interrogation to make sure that he does not have high burden of RV pacing (suspect this is unlikely as AV node has been ok). Will ask MDT rep to interrogate.   6. HLD:  LDL 38 this admission  -Continue Atorvastatin 40mg  daily with goal LDL less than 70 with known CAD.   7. Anemia/Tarry Stools: Reported darker stools for 2 weeks leading to admission with occult blood being negative.Hgb stable - Will need to follow-up with GI as an outpatient.   8. Type 2 DM: SSIwhile inpatient status  9. Deconditioning: - PT has recommended HHPT. Will arrange.  - Continue to ambulate w/ PT and CR today   Will transfer out of ICU to progressive care unit. Possible d/c home tomorrow.   Lyda Jester PA-C  12/02/2019 8:58 AM   Patient seen and examined with the above-signed Advanced Practice Provider and/or Housestaff. I personally reviewed laboratory data, imaging studies and relevant notes. I independently examined the patient and formulated the important aspects of the plan. I have edited the note to reflect any of my changes or salient points. I have personally discussed the plan with the patient and/or family.  Feeling better.CVP 8. Co-ox 68%. Creatinine stable. Myoview reviewed personally. EF 28% Mostly infarct. Mild ischemia  General:  Well appearing. No resp difficulty HEENT: normal Neck: supple. JVP 8. Carotids 2+ bilat; no bruits. No lymphadenopathy or thryomegaly appreciated. Cor: PMI nondisplaced. Regular rate & rhythm. No rubs, gallops or murmurs. Lungs: clear Abdomen: obese soft, nontender, nondistended. No hepatosplenomegaly. No bruits or masses. Good bowel sounds. Extremities: no cyanosis, clubbing, rash, edema Neuro: alert & orientedx3, cranial nerves grossly intact. moves all 4 extremities w/o difficulty. Affect pleasant  Feeling better. Myoview looks like he likely los RCA graft and OM-1 graft. Has mild ischemia in  LAD territory but not enough to warrant risk of cath with creatinine 2.4. Wil treat medically for now. If develops refractory angina can reconsider.  Start hydral/nitrates. Can move out of ICU.   Glori Bickers, MD  12:23 PM

## 2019-12-03 DIAGNOSIS — I214 Non-ST elevation (NSTEMI) myocardial infarction: Secondary | ICD-10-CM | POA: Diagnosis not present

## 2019-12-03 DIAGNOSIS — I5021 Acute systolic (congestive) heart failure: Secondary | ICD-10-CM | POA: Diagnosis not present

## 2019-12-03 LAB — GLUCOSE, CAPILLARY
Glucose-Capillary: 140 mg/dL — ABNORMAL HIGH (ref 70–99)
Glucose-Capillary: 213 mg/dL — ABNORMAL HIGH (ref 70–99)

## 2019-12-03 LAB — BASIC METABOLIC PANEL
Anion gap: 10 (ref 5–15)
BUN: 65 mg/dL — ABNORMAL HIGH (ref 8–23)
CO2: 31 mmol/L (ref 22–32)
Calcium: 9.2 mg/dL (ref 8.9–10.3)
Chloride: 100 mmol/L (ref 98–111)
Creatinine, Ser: 2.41 mg/dL — ABNORMAL HIGH (ref 0.61–1.24)
GFR calc Af Amer: 29 mL/min — ABNORMAL LOW (ref >60)
GFR calc non Af Amer: 25 mL/min — ABNORMAL LOW (ref >60)
Glucose, Bld: 145 mg/dL — ABNORMAL HIGH (ref 70–99)
Potassium: 3.6 mmol/L (ref 3.5–5.1)
Sodium: 141 mmol/L (ref 135–145)

## 2019-12-03 MED ORDER — METOPROLOL SUCCINATE ER 50 MG PO TB24: 50.0000 mg | ORAL_TABLET | Freq: Every day | ORAL | 5 refills | Status: DC

## 2019-12-03 MED ORDER — ISOSORBIDE MONONITRATE ER 30 MG PO TB24: 30.0000 mg | ORAL_TABLET | Freq: Every day | ORAL | 5 refills | Status: DC

## 2019-12-03 MED ORDER — FUROSEMIDE 40 MG PO TABS: 60.0000 mg | ORAL_TABLET | Freq: Every day | ORAL | 5 refills | Status: DC

## 2019-12-03 MED ORDER — CLOPIDOGREL BISULFATE 75 MG PO TABS: 75.0000 mg | ORAL_TABLET | Freq: Every day | ORAL | 5 refills | Status: DC

## 2019-12-03 MED ORDER — HYDRALAZINE HCL 25 MG PO TABS: 12.5000 mg | ORAL_TABLET | Freq: Three times a day (TID) | ORAL | 5 refills | Status: DC

## 2019-12-03 MED ORDER — POTASSIUM CHLORIDE CRYS ER 20 MEQ PO TBCR
40.0000 meq | EXTENDED_RELEASE_TABLET | Freq: Once | ORAL | Status: AC
  Administered 2019-12-03: 40 meq via ORAL
  Filled 2019-12-03: qty 2

## 2019-12-03 MED ORDER — SPIRONOLACTONE 25 MG PO TABS: 12.5000 mg | ORAL_TABLET | Freq: Every day | ORAL | 5 refills | Status: DC

## 2019-12-03 MED FILL — hydrALAZINE HCL 25 MG TABS: 25 | 30 days supply | Qty: 45 | Fill #0

## 2019-12-03 MED FILL — FUROSEMIDE 40 MG TABLET: 40 | 30 days supply | Qty: 45 | Fill #0

## 2019-12-03 MED FILL — CLOPIDOGREL 75 MG TABLET: 75 | 30 days supply | Qty: 30 | Fill #0

## 2019-12-03 MED FILL — ISOSORBIDE MN ER 30 MG TAB: 30 | 30 days supply | Qty: 30 | Fill #0

## 2019-12-03 MED FILL — SPIRONOLACTONE 25 MG TABLET: 25 | 30 days supply | Qty: 15 | Fill #0

## 2019-12-03 MED FILL — METOPROLOL SUCCINATE ER 50: 50 | 30 days supply | Qty: 30 | Fill #0

## 2019-12-03 NOTE — TOC Transition Note (Addendum)
Transition of Care [facility]) - CM/SW Discharge Note   Patient Details  Name: [name] MRN: [id number] Date of Birth: [birth year]  Transition of Care [facility]) CM/SW Contact:  Zenon Mayo, RN Phone Number: 12/03/2019, 2:09 PM   Clinical Narrative:    Patient is for dc today, he is set up with [facility] from previous NCM, he will also need rolling walker and 3 n 1, referral made to [facility] with [employer] this will be delivered to the house.  NCM notiifed Butch Penny with [facility] of the dc, order is for HHPT, NCM asked [place] to add RN, OT and aide to orders..  Soc will begin 24 to 48 hrs post dc.  NCM asked daughter [name] if they still wanted hospital bed, she states no they do not want a hospital bed at this time.   Final next level of care: [place] Barriers to Discharge: No Barriers Identified   Patient Goals and CMS Choice Patient states their goals for this hospitalization and ongoing recovery are:: to return home CMS Medicare.gov Compare Post Acute Care list provided to:: Patient Choice offered to / list presented to : Patient  Discharge Placement                       Discharge Plan and Services In-house Referral: NA Discharge Planning Services: CM Consult Post Acute Care Choice: Home Health                    HH Arranged: PT [facility] Agency: [place] ([facility]) Date [facility] Agency Contacted: 12/03/19 Time [place]: [place] Representative spoke with at [place]: [place] ([place]) Interventions     Readmission Risk Interventions Readmission Risk Prevention Plan 11/28/2019  Transportation Screening Complete  HRI or [place] Complete  Social Work Consult for [place] Planning/Counseling Complete  Palliative Care Screening Not Applicable  Medication Review Press photographer) Complete  Some recent data might be hidden

## 2019-12-03 NOTE — Progress Notes (Signed)
CARDIAC REHAB PHASE I   Discussed MI, HF booklet, low sodium, daily wts, NTG, and CRPII with pt. Receptive. Will refer to [place]. [phone]  [place], ACSM 12/03/2019 11:38 AM

## 2019-12-03 NOTE — Progress Notes (Addendum)
Patient ID: [name], adult   DOB: 1941/05/18, 79 y.o.   MRN: [id number]     Advanced Heart Failure Rounding Note  PCP-Cardiologist: Thompson Grayer, MD   Subjective:    NST 12/01/19   Lexiscan stress: Electrically negative for ischemia  Myovue scan with large defect in the anterolateral (base,mid, distal), inferolateral (base, mid, distal)and inferior (base, mid) walls that improves minimally in inferior mid region consistent with large area of scar with minimal periinfarct ischemia. Small defect in the anterior mid region that improves consistent with a small region of mild ischemia.  LVEF calculated at 28% with diffuse hypokinesis.  High risk scan  Compared to myovue scan from 2018, anterolateral and anterior changes are new and LVEF is worse.  Opting on medical therapy. He is stable w/o CP.   Device interrogated by Medtronic. RV pacing < 1%.     PO Lasix resumed yesterday, 60 mg daily. Not much charted for UOP, only 550 but ? If strict/Os documented. Wt however is down 3 lb.  SCr up slightly from 2.36>>2.4.   BP stable after addition of hydralazine + nitrate. Tolerating ok w/o SE.    Evaluated by PT. They have recommended HHPT.     RHC (4/30):  RA = 16 RV = 59/16 PA = 56/26 (34) PCW = 24 (v = 34) Fick cardiac output/index = 8.3/3.8 Thermo CO/CI = 5.1/2.3 PVR = 2.0 (thermo) FA sat = 96% PA sat = 71%, 71% SVC sat = 70%  Objective:   Weight Range: 95.8 kg Body mass index is 33.08 kg/m.   Vital Signs:   Temp:  [97.9 F (36.6 C)-98.8 F (37.1 C)] 98.8 F (37.1 C) (05/05 1018) Pulse Rate:  [61-94] 63 (05/05 1018) Resp:  [16-18] 16 (05/05 1018) BP: (101-124)/(53-72) 106/60 (05/05 1018) SpO2:  [89 %-96 %] 94 % (05/05 1018) Weight:  [95.8 kg-97.2 kg] 95.8 kg (05/05 0428) Last BM Date: 12/02/19  Weight change: Filed Weights   12/02/19 0500 12/02/19 1350 12/03/19 0428  Weight: 96.5 kg 97.2 kg 95.8 kg    Intake/Output:   Intake/Output Summary (Last 24  hours) at 12/03/2019 1022 Last data filed at 12/03/2019 0900 Gross per 24 hour  Intake 600 ml  Output 650 ml  Net -50 ml      Physical Exam  PHYSICAL EXAM: General:  Well appearing, obese. No respiratory difficulty HEENT: normal Neck: supple. no JVD. Carotids 2+ bilat; no bruits. No lymphadenopathy or thyromegaly appreciated. Cor: PMI nondisplaced. Regular rate & rhythm. No rubs, gallops or murmurs. Lungs: clear Abdomen: obese, soft, nontender, nondistended. No hepatosplenomegaly. No bruits or masses. Good bowel sounds. Extremities: no cyanosis, clubbing, rash, edema Neuro: alert & oriented x 3, cranial nerves grossly intact. moves all 4 extremities w/o difficulty. Affect pleasant.   Telemetry   A-paced 70s Personally reviewed  Labs    CBC No results for input(s): WBC, NEUTROABS, HGB, HCT, MCV, PLT in the last 72 hours. Basic Metabolic Panel Recent Labs    11/30/19 2005 12/01/19 0900 12/02/19 0303 12/03/19 0559  NA  --    < > 140 141  K  --    < > 3.7 3.6  CL  --    < > 96* 100  CO2  --    < > 34* 31  GLUCOSE  --    < > 170* 145*  BUN  --    < > 66* 65*  CREATININE  --    < > 2.36* 2.41*  CALCIUM  --    < >  8.9 9.2  MG 1.7  --   --   --    < > = values in this interval not displayed.   Liver Function Tests No results for input(s): AST, ALT, ALKPHOS, BILITOT, PROT, ALBUMIN in the last 72 hours. No results for input(s): LIPASE, AMYLASE in the last 72 hours. Cardiac Enzymes No results for input(s): CKTOTAL, CKMB, CKMBINDEX, TROPONINI in the last 72 hours.  BNP: BNP (last 3 results) Recent Labs    11/22/19 1812  BNP 1,194.0*    ProBNP (last 3 results) No results for input(s): PROBNP in the last 8760 hours.   D-Dimer No results for input(s): DDIMER in the last 72 hours. Hemoglobin A1C No results for input(s): HGBA1C in the last 72 hours. Fasting Lipid Panel No results for input(s): CHOL, HDL, LDLCALC, TRIG, CHOLHDL, LDLDIRECT in the last 72 hours. Thyroid  Function Tests No results for input(s): TSH, T4TOTAL, T3FREE, THYROIDAB in the last 72 hours.  Invalid input(s): FREET3  Other results:   Imaging    No results found.   Medications:     Scheduled Medications: . alfuzosin  10 mg Oral Daily  . aspirin EC  81 mg Oral Daily  . atorvastatin  40 mg Oral QHS  . chlorhexidine  15 mL Mouth Rinse BID  . Chlorhexidine Gluconate Cloth  6 each Topical Daily  . clopidogrel  75 mg Oral Daily  . furosemide  60 mg Oral Daily  . heparin injection (subcutaneous)  5,000 Units Subcutaneous Q8H  . hydrALAZINE  12.5 mg Oral Q8H  . insulin aspart  0-15 Units Subcutaneous TID WC  . isosorbide mononitrate  30 mg Oral Daily  . mouth rinse  15 mL Mouth Rinse q12n4p  . metoprolol succinate  50 mg Oral Daily  . pantoprazole  40 mg Oral q AM  . sodium chloride flush  10-40 mL Intracatheter Q12H  . sodium chloride flush  3 mL Intravenous Q12H  . spironolactone  12.5 mg Oral Daily  . tamsulosin  0.4 mg Oral q AM    Infusions: . sodium chloride Stopped (11/30/19 2131)    PRN Medications: sodium chloride, acetaminophen, ipratropium-albuterol, nitroGLYCERIN, ondansetron (ZOFRAN) IV, [START ON 12/04/2019] pneumococcal 23 valent vaccine, sodium chloride flush   Assessment/Plan   1. NSTEMI with known CAD: [place] CAD and is s/p CABG in 2000 (LIMA to the LAD, saphenous vein graft to the OM1 and OM2 and posterior descending).  Low-risk NST in 2018 whopresented with worsening dyspnea on exertion, orthopnea and edema.  Troponin565>>506>>1120. No chest pain.  - Given NSTEMI and drop in EF, ideally would opt for coronary/graft angiography. However due to elevated SCr, had NST for evaluation, showing large defect in the anterolateral (base,mid, distal), inferolateral (base, mid, distal)and inferior (base, mid) walls that improves minimally in inferior mid region consistent with large area of scar with minimal periinfarct ischemia. Small defect in the anterior  mid region that improves consistent with a small region of mild ischemia. EF 28% - Suspect he likely has occluded his SVG- OM but, given small area of ischemia, risk of LHC outweighs benefit. Will plan to treat medically. Can reconsider cath if he develops refractory angaina   - Continue ASA, Plavix, Statin  blocker and spironolactone. No ARB w/ CKD.  - stable w/o CP    2. AcutesystolicCHFexacerbation: Presented withworsening dyspnea on exertion, orthopnea and lower extremity edema for the past severalweeks. BNP was elevated to1194andCXR showedincreased vascular congestion without edema and chronic trace pleural effusionson admission.  Echo  11/25/19 with LVEF at 30-35% and global hypokinesis (previous EF 52% in 2018), Moderate pulmonary hTN with PA pressure at 46.22mmHg.  Volume overloaded on RHC 4/30 w/ preserved cardiac output.  Volume status improved w/ diuresis.   - Volume status good today. Continue PO Lasix 60 mg daily  - Continue spironolactone 12.5 daily.   - Continue Toprol XL 50 mg daily.  - No ARB/ARNI/spiro with AKI.  - Continue hydralazine 12.5 mg daily  - Continue Imdur 30 mg daily    3. Acute hypoxic/hypercarbic respiratory failure: Resolved, off Bipap and now on [place].    4. AKI on CKD stage 3:  baseline 1.5-2.0 but as high as 3.9 in past. Creatinine 2.79 => 2.46 => 2.33=>2.65=>2,56=>2.36=>2.4  - will repeat BMP at clinic f/u next week.   5. Symptomatic Bradycardia:  s/pMedtronicPPM placement which is followed by Dr. Rayann Heman. Currently a-pacing.  - [employer] interrogated device. He does not have high burden of RV pacing ( <1%)  6. HLD:  LDL 38 this admission  -Continue Atorvastatin 40mg  daily with goal LDL less than 70 with known CAD.   7. Anemia/Tarry Stools: Reported darker stools for 2 weeks leading to admission with occult blood being negative.Hgb stable - Will need to follow-up with GI as an outpatient.   8. Type 2 DM: SSIwhile inpatient status - going  home today. Reviewed home meds. We will d/c Actos given CHF - ok to resume Amaryl.  - Will need to f/u w/ PCP - GRF borderline for SGTL2i (25) + w/ obesity/large panus, may be higher risk for GU infection    9. Deconditioning: - PT has recommended HHPT. Will arrange.    Plan d/c home today. Meds through [place]. Will arrange home health PT and Clinic f/u in 1 week. Will need repeat BMP at f/u   Lyda Jester PA-C  12/03/2019 10:22 AM    Patient seen and examined with the above-signed Advanced Practice Provider and/or Housestaff. I personally reviewed laboratory data, imaging studies and relevant notes. I independently examined the patient and formulated the important aspects of the plan. I have edited the note to reflect any of my changes or salient points. I have personally discussed the plan with the patient and/or family.  He looks good today. Volume status and renal function stable on po lasix. Able to ambulate. We reviewed results on nuclear study and plan of care. Medications discussed in detail by HF pharmD.[name] He can go home today with close f/u in HF Clinic.  Glori Bickers, MD  12:27 PM

## 2019-12-03 NOTE — Plan of Care (Signed)
Patient alert and oriented, VSS,  denies pain. IV and tele d/c  Discharge instruction explain and given to the daughter, all questions answered. Patient d/c home per order.

## 2019-12-03 NOTE — Discharge Summary (Addendum)
Advanced Heart Failure Team  Discharge Summary   Patient ID: [name] MRN: [id number], DOB/AGE: October 11, 1940 79 y.o. 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 ED on 11/22/2019 for evaluation of fatigue and tarry stools.   He reported worsening dyspnea + LE edema and exertional fatigue for the past month. NYHA IIIb-IV. no chest pain. He initially thought his symptoms might be secondary to a GI ulcer as he reports similar symptoms when he had significant anemia in the past. Not on a diuretic at home and reported his family doctor told him he had an "elevated creatinine" in the past.     Initial labs at [facility] showed WBC 4.2, Hgb 11.2, platelets 121, Na+ 141, K+ 4.1 and creatinine 2.30. BNP 1194. Negative for COVID. Initial HS Troponin 565 with repeat of 506 -> 1,120 Occult blood negative. CXR w/ increased vascular congestion without edema and chronic trace pleural effusions. EKG showed NSR, HR 78 with PAC's with slight ST elevation along Lead III and minimal depression along lateral leads. Also had elevated Troponin c/w NSTEMI, 565>> 506>>1120. Placed on IV heparin.    Echo was done 4/27 with newly reduced EF 30-35% and moderate RV dysfunction. Previous echo 2013 EF 50-55%. Myoview 11/18 EF 52% with fixed inferolateral defect. His device was interrogated and did not show high burden RV pacing rate.    He was transferred to [facility] for further management and potential R/LHC given new drop in EF and elevated troponin. He was started on IV  lasix and initially planned for R/L cath day of transfer but developed respiratory distress with CO2 retention requiring BIPAP. Respiratory status improved w/ diuresis but he developed worsening renal function. SCr 1.98 on admit and SCr peaked at 2.8. Given elevated SCr, NST was opted for initial ischemic testing instead of LHC. NST showed large defect in the anterolateral (base,mid, distal), inferolateral (base, mid, distal)and inferior (base, mid) walls that improves minimally in inferior mid region consistent with large area of scar with minimal periinfarct ischemia. Small defect in the anterior mid region that improves consistent with a small region of mild ischemia. EF 28%.  It was suspected he likely had occluded his SVG- OM but, given small area of ischemia, risk of LHC outweighs benefit. Decision was made to treat medically. Can reconsider cath if he develops refractory angina. Plavix was added to ASA. Placed on statin. ? blocker continued. Imdur and hydralazine both started in place of ARB/ARNi. He was transitioned off of IV Lasix to 60 mg PO once daily. SCr had stabilized at 2.4. Also of note, he was on Actos for DM PTA. This was discontinued due to his HF. He was continued on Amaryl and instructed to f/u with his PCP for further management. He was also advised to f/u with GI as an outpatient.   On 5/5, he was last seen and examined by Dr. Haroldine Laws and felt stable for d/c home. PT/OT evaluated prior to d/c and recommended home health PT, which was  arranged. [facility] post hospital f/u arranged in 1 week. Will need repeat BMP at that time.    Discharge Weight Range: 211 lb  Discharge Vitals: Blood pressure 98/60, pulse 62, temperature 98.7 F (37.1 C), temperature source Oral, resp. rate 20, height 5\' 7"  (1.702 m), weight 95.8 kg, SpO2 94 %.  Labs: Lab Results  Component Value Date   WBC 4.4 11/30/2019   HGB 10.5 (L) 11/30/2019   HCT 33.2 (L) 11/30/2019   MCV 91.2 11/30/2019   PLT 120 (L)  11/30/2019    Recent Labs  Lab 12/03/19 0559  NA 141  K 3.6  CL 100  CO2 31  BUN 65*  CREATININE 2.41*  CALCIUM 9.2  GLUCOSE 145*   Lab Results  Component Value Date   CHOL 95 11/26/2019   HDL 47 11/26/2019   LDLCALC 38 11/26/2019   TRIG 48 11/26/2019   BNP (last 3 results) Recent Labs    11/22/19 1812  BNP 1,194.0*    ProBNP (last 3 results) No results for input(s): PROBNP in the last 8760 hours.   Diagnostic Studies/Procedures   NM Myocar Multi W/Spect W/Wall Motion / EF  Result Date: 12/01/2019  Lexiscan stress: Electrically negative for ischemia  Myovue scan with large defect in the anterolateral (base,mid, distal), inferolateral (base, mid, distal)and inferior (base, mid) walls that improves minimally in inferior mid region consistent with large area of scar with minimal periinfarct ischemia. Small defect in the anterior mid region that improves consistent with a small region of mild ischemia.  LVEF calculated at 28% with diffuse hypokinesis.  High risk scan  Compared to myovue scan from 2018, anterolateral and anterior changes are new and LVEF is worse.     Discharge Medications   Allergies as of 12/03/2019   No Known Allergies      Medication List     STOP taking these medications    pioglitazone 30 MG tablet Commonly known as: ACTOS       TAKE these medications    acetaminophen 500 MG tablet Commonly known as: TYLENOL Take 1,000 mg by mouth every 6 (six) hours as needed for moderate pain or headache.   alfuzosin 10 MG 24 hr tablet Commonly known as: UROXATRAL TAKE 1 TABLET BY MOUTH EVERY DAY What changed: when to take this   aspirin EC 81 MG tablet Take 1 tablet (81 mg total) by mouth daily. Restart in 2 weeks What changed:  when to take this additional instructions   atorvastatin 40 MG tablet Commonly known as: LIPITOR Take 40 mg by mouth at bedtime.   clopidogrel 75 MG tablet Commonly known as: PLAVIX Take 1 tablet (75 mg  total) by mouth daily. Start taking on: Dec 04, 2019   furosemide 40 MG tablet Commonly known as: LASIX Take 1.5 tablets (60 mg total) by mouth daily. Start taking on: Dec 04, 2019   glimepiride 2 MG tablet Commonly known as: AMARYL Take 2 mg by mouth daily.   hydrALAZINE 25 MG tablet Commonly known as: APRESOLINE Take 0.5 tablets (12.5 mg total) by mouth every 8 (eight) hours.   isosorbide mononitrate 30 MG 24 hr tablet Commonly known as: IMDUR Take 1 tablet (30 mg total) by mouth daily. Start taking on: Dec 04, 2019   metoprolol succinate 50 MG 24 hr tablet Commonly known as: TOPROL-XL Take 1 tablet (50 mg total) by mouth daily. Take with or immediately following a meal. Start taking on: Dec 04, 2019 What changed:  how much to  take when to take this additional instructions   MULTIVITAMIN ADULTS PO Take 1 tablet by mouth daily.   nitroGLYCERIN 0.4 MG SL tablet Commonly known as: NITROSTAT Place 0.4 mg under the tongue every 5 (five) minutes as needed for chest pain.   oxymetazoline 0.05 % nasal spray Commonly known as: AFRIN Place 1 spray into both nostrils 2 (two) times daily as needed for congestion.   pantoprazole 40 MG tablet Commonly known as: PROTONIX TAKE 1 TABLET BY MOUTH EVERY DAY BEFORE BREAKFAST What changed: See the new instructions.   spironolactone 25 MG tablet Commonly known as: ALDACTONE Take 0.5 tablets (12.5 mg total) by mouth daily. Start taking on: Dec 04, 2019   tamsulosin 0.4 MG Caps capsule Commonly known as: FLOMAX Take 0.4 mg by mouth in the morning.               Durable Medical Equipment  (From admission, onward)           Start     Ordered   12/03/19 1406  For home use only DME 3 n 1  Once     12/03/19 1405   12/03/19 1405  For home use only DME Walker rolling  Once    Question Answer Comment  Walker: With 5 Inch Wheels   Patient needs a walker to treat with the following condition Weakness      12/03/19 1405             Disposition   The patient will be discharged in stable condition to home. Discharge Instructions     Amb Referral to Cardiac Rehabilitation   Complete by: As directed    Diagnosis: NSTEMI   After initial evaluation and assessments completed: Virtual Based Care may be provided alone or in conjunction with Phase 2 Cardiac Rehab based on patient barriers.: Yes   Face-to-face encounter (required for Medicare/Medicaid patients)   Complete by: As directed    I Lyda Jester certify that this patient is under my care and that I, or a nurse practitioner or physician's assistant working with me, had a face-to-face encounter that meets the physician face-to-face encounter requirements with this patient on 12/03/2019. The encounter with the patient was in whole, or in part for the following medical condition(s) which is the primary reason for home health care (List medical condition): chronic systolic heart failure   The encounter with the patient was in whole, or in part, for the following medical condition, which is the primary reason for home health care: chronic systolic heart failure   I certify that, based on my findings, the following services are medically necessary home health services: Physical therapy   Reason for Medically Necessary Home Health Services: Skilled Nursing- Change/Decline in Patient Status   My clinical findings support the need for the above services: Shortness of breath with activity   Further, I certify that my clinical findings support that this patient is homebound due to: Shortness of Breath with activity   Face-to-face encounter (required for Medicare/Medicaid patients)   Complete by: As directed    I Lyda Jester certify that this patient is under my care and that I, or a nurse practitioner or physician's assistant working with me, had a face-to-face encounter that meets the physician face-to-face encounter requirements with this patient on 12/02/2019. The  encounter with the patient was in whole, or in part for the following medical condition(s) which is the primary reason for home health care (List medical condition): chronic systolic heart  failure Needs Home Health PT/OT, RN and Aide   The encounter with the patient was in whole, or in part, for the following medical condition, which is the primary reason for home health care: chronic systolic heart failure   I certify that, based on my findings, the following services are medically necessary home health services:  Physical therapy Nursing     Reason for Medically Necessary Home Health Services: Skilled Nursing- Change/Decline in Patient Status   My clinical findings support the need for the above services: Shortness of breath with activity   Further, I certify that my clinical findings support that this patient is homebound due to: Shortness of Breath with activity   Home Health   Complete by: As directed    To provide the following care/treatments:  PT [place]   Complete by: As directed    To provide the following care/treatments: PT      Follow-up Information     [facility] Molina Follow up.   Why:  RN, PT, OT, aide  Contact information: [address] [place] [phone]        Monico Blitz, MD. Go on 12/10/2019.   Specialty: Internal Medicine Why: @12 :00pm Contact information: [place] [address] [phone]         [place] Follow up on 12/10/2019.   Specialty: Cardiology Why: Advanced Heart Failure Clinic 8:30 AM Parking Garage Code 5008 Contact information: [address] [id number] [place] [phone]             Duration of Discharge Encounter: Greater than 35 minutes   Signed, Nelida Gores  12/03/2019, 5:05 PM  Patient seen and examined with the above-signed Advanced Practice Provider and/or  Housestaff. I personally reviewed laboratory data, imaging studies and relevant notes. I independently examined the patient and formulated the important aspects of the plan. I have edited the note to reflect any of my changes or salient points. I have personally discussed the plan with the patient and/or family.  He is much improved. Volume status ok. Results of Myoview discussed. Suspect he has lost 2 grafts but given lack of angina, CKD IV and minimal ischemia on Myoview will treat medically. Will follow closely in HF Clinic. D/w his daughter Kai Levins, RN.   Glori Bickers, MD  11:32 PM

## 2019-12-03 NOTE — Progress Notes (Signed)
Physical Therapy Treatment Patient Details Name: [name] MRN: [id number] DOB: Sep 24, 1940 Today's Date: 12/03/2019    History of Present Illness 79 y.o. adult with past medical history of CAD (s/p CABG in 2000), symptomatic bradycardia (s/p PPM placement), HTN, HLD, Type 2 DM, OSA and prostate cancer who presented to [facility] ED on 11/22/2019 for evaluation of fatigue and tarry stools. Transferred to [employer] [facility] . Pt with CHF and NSTEMI    PT Comments    Pt OOB getting to [facility] upon arrival of PT, eager to participate in therapy this morning. The pt was able to progress ambulation distance, but continues to benefit from seated rest to recover due to fatigue with ambulation. The pt maintained SpO2 in 90s throughout session (low of 91% following ambulation, with quick recovery to 95% with seated rest). The pt was educated in energy conservation, and will continue to benefit from skilled PT to further progress functional strength and endurance.    Follow Up Recommendations  Home health PT;Supervision/Assistance - 24 hour     Equipment Recommendations  None recommended by PT    Recommendations for Other Services       Precautions / Restrictions Precautions Precautions: Fall Precaution Comments: pt with diarrhea today, used brief during session Restrictions Weight Bearing Restrictions: No    Mobility  Bed Mobility               General bed mobility comments: pt on BSC beginning and end of session  Transfers Overall transfer level: Needs assistance Equipment used: Rolling walker (2 wheeled) Transfers: Sit to/from Stand Sit to Stand: Min guard         General transfer comment: Cues for hand placement, Pt able to stand without RW as long as surface is available for BUE support for stability.  Ambulation/Gait Ambulation/Gait assistance: Min guard Gait Distance (Feet): 100 Feet(100 ft x 2) Assistive device: Rolling walker (2 wheeled) Gait Pattern/deviations:  Step-through pattern;Trunk flexed;Decreased stride length Gait velocity: 0.44 m/s Gait velocity interpretation: 1.31 - 2.62 ft/sec, indicative of limited community ambulator General Gait Details: cues for posture and proximity to RW. pt with back pain and kyphosis at baseline. Pt walked 95' x 2 trials with seated rest between, limited by fatigue   Stairs             Wheelchair Mobility    Modified Rankin (Stroke Patients Only)       Balance Overall balance assessment: Mild deficits observed, not formally tested                                          Cognition Arousal/Alertness: Awake/alert Behavior During Therapy: WFL for tasks assessed/performed Overall Cognitive Status: Within Functional Limits for tasks assessed                                 General Comments: Pt very friendly and cooperative through session, eager to participate      Exercises      General Comments General comments (skin integrity, edema, etc.): Pt on RA, SpO2 at low of 91% with 100 ft ambulation in hallway. Recovered to 95% with seated rest. No reports of dizziness today      Pertinent Vitals/Pain Pain Assessment: No/denies pain    Home Living  Prior Function            PT Goals (current goals can now be found in the care plan section) Acute Rehab PT Goals Patient Stated Goal: get back to his family PT Goal Formulation: With patient Time For Goal Achievement: 12/10/19 Potential to Achieve Goals: Good Progress towards PT goals: Progressing toward goals    Frequency    Min 3X/week      PT Plan Current plan remains appropriate    Co-evaluation              AM-PAC PT "6 Clicks" Mobility   Outcome Measure  Help needed turning from your back to your side while in a flat bed without using bedrails?: None Help needed moving from lying on your back to sitting on the side of a flat bed without using bedrails?: A  Little Help needed moving to and from a bed to a chair (including a wheelchair)?: A Little Help needed standing up from a chair using your arms (e.g., wheelchair or bedside chair)?: A Little Help needed to walk in hospital room?: A Little Help needed climbing 3-5 steps with a railing? : A Little 6 Click Score: 19    End of Session Equipment Utilized During Treatment: Gait belt Activity Tolerance: Patient tolerated treatment well Patient left: with call bell/phone within reach;Other (comment)(on BSC) Nurse Communication: Mobility status PT Visit Diagnosis: Unsteadiness on feet (R26.81);Other abnormalities of gait and mobility (R26.89);Muscle weakness (generalized) (M62.81);Difficulty in walking, not elsewhere classified (R26.2)     Time: 7494-4967 PT Time Calculation (min) (ACUTE ONLY): 33 min  Charges:  $Gait Training: 8-22 mins $Therapeutic Activity: 8-22 mins                     Karma Ganja, PT, DPT   Acute Rehabilitation Department Pager #: [phone]   Otho Bellows 12/03/2019, 10:16 AM

## 2019-12-04 DIAGNOSIS — I5021 Acute systolic (congestive) heart failure: Secondary | ICD-10-CM | POA: Diagnosis not present

## 2019-12-05 ENCOUNTER — Telehealth (HOSPITAL_COMMUNITY): Payer: Self-pay | Admitting: *Deleted

## 2019-12-05 NOTE — Telephone Encounter (Signed)
Verbal order given for occupational therapy.  [place] w/ahc [phone]

## 2019-12-09 ENCOUNTER — Encounter (HOSPITAL_COMMUNITY): Payer: Self-pay

## 2019-12-09 ENCOUNTER — Ambulatory Visit (HOSPITAL_COMMUNITY)
Admit: 2019-12-09 | Discharge: 2019-12-09 | Disposition: A | Discharge status: Home / Self Care | Payer: Medicare Other | Attending: Cardiology | Admitting: Cardiology

## 2019-12-09 ENCOUNTER — Other Ambulatory Visit: Payer: Self-pay

## 2019-12-09 VITALS — BP 124/82 | HR 84 | Wt 210.0 lb

## 2019-12-09 DIAGNOSIS — Z87891 Personal history of nicotine dependence: Secondary | ICD-10-CM | POA: Insufficient documentation

## 2019-12-09 DIAGNOSIS — N189 Chronic kidney disease, unspecified: Secondary | ICD-10-CM | POA: Diagnosis not present

## 2019-12-09 DIAGNOSIS — I10 Essential (primary) hypertension: Secondary | ICD-10-CM

## 2019-12-09 DIAGNOSIS — E1122 Type 2 diabetes mellitus with diabetic chronic kidney disease: Secondary | ICD-10-CM | POA: Insufficient documentation

## 2019-12-09 DIAGNOSIS — C61 Malignant neoplasm of prostate: Secondary | ICD-10-CM | POA: Diagnosis not present

## 2019-12-09 DIAGNOSIS — I272 Pulmonary hypertension, unspecified: Secondary | ICD-10-CM | POA: Insufficient documentation

## 2019-12-09 DIAGNOSIS — Z950 Presence of cardiac pacemaker: Secondary | ICD-10-CM | POA: Insufficient documentation

## 2019-12-09 DIAGNOSIS — Z8249 Family history of ischemic heart disease and other diseases of the circulatory system: Secondary | ICD-10-CM | POA: Insufficient documentation

## 2019-12-09 DIAGNOSIS — M25579 Pain in unspecified ankle and joints of unspecified foot: Secondary | ICD-10-CM | POA: Diagnosis not present

## 2019-12-09 DIAGNOSIS — J449 Chronic obstructive pulmonary disease, unspecified: Secondary | ICD-10-CM | POA: Diagnosis not present

## 2019-12-09 DIAGNOSIS — Z7902 Long term (current) use of antithrombotics/antiplatelets: Secondary | ICD-10-CM | POA: Insufficient documentation

## 2019-12-09 DIAGNOSIS — I130 Hypertensive heart and chronic kidney disease with heart failure and stage 1 through stage 4 chronic kidney disease, or unspecified chronic kidney disease: Secondary | ICD-10-CM | POA: Insufficient documentation

## 2019-12-09 DIAGNOSIS — R2689 Other abnormalities of gait and mobility: Secondary | ICD-10-CM | POA: Diagnosis not present

## 2019-12-09 DIAGNOSIS — I214 Non-ST elevation (NSTEMI) myocardial infarction: Secondary | ICD-10-CM | POA: Insufficient documentation

## 2019-12-09 DIAGNOSIS — M79673 Pain in unspecified foot: Secondary | ICD-10-CM

## 2019-12-09 DIAGNOSIS — I5021 Acute systolic (congestive) heart failure: Secondary | ICD-10-CM | POA: Insufficient documentation

## 2019-12-09 DIAGNOSIS — E669 Obesity, unspecified: Secondary | ICD-10-CM | POA: Diagnosis not present

## 2019-12-09 DIAGNOSIS — Z79899 Other long term (current) drug therapy: Secondary | ICD-10-CM | POA: Insufficient documentation

## 2019-12-09 DIAGNOSIS — Z951 Presence of aortocoronary bypass graft: Secondary | ICD-10-CM | POA: Insufficient documentation

## 2019-12-09 DIAGNOSIS — I2510 Atherosclerotic heart disease of native coronary artery without angina pectoris: Secondary | ICD-10-CM | POA: Diagnosis not present

## 2019-12-09 DIAGNOSIS — I5022 Chronic systolic (congestive) heart failure: Secondary | ICD-10-CM | POA: Diagnosis not present

## 2019-12-09 DIAGNOSIS — Z7982 Long term (current) use of aspirin: Secondary | ICD-10-CM | POA: Insufficient documentation

## 2019-12-09 DIAGNOSIS — M1990 Unspecified osteoarthritis, unspecified site: Secondary | ICD-10-CM | POA: Insufficient documentation

## 2019-12-09 DIAGNOSIS — G4733 Obstructive sleep apnea (adult) (pediatric): Secondary | ICD-10-CM | POA: Diagnosis not present

## 2019-12-09 DIAGNOSIS — R531 Weakness: Secondary | ICD-10-CM | POA: Diagnosis not present

## 2019-12-09 LAB — CBC
HCT: 38 % — ABNORMAL LOW (ref 39.0–52.0)
Hemoglobin: 11.8 g/dL — ABNORMAL LOW (ref 13.0–17.0)
MCH: 28.2 pg (ref 26.0–34.0)
MCHC: 31.1 g/dL (ref 30.0–36.0)
MCV: 90.9 fL (ref 80.0–100.0)
Platelets: 212 10*3/uL (ref 150–400)
RBC: 4.18 MIL/uL — ABNORMAL LOW (ref 4.22–5.81)
RDW: 15.5 % (ref 11.5–15.5)
WBC: 6.6 10*3/uL (ref 4.0–10.5)
nRBC: 0 % (ref 0.0–0.2)

## 2019-12-09 LAB — BASIC METABOLIC PANEL
Anion gap: 14 (ref 5–15)
BUN: 71 mg/dL — ABNORMAL HIGH (ref 8–23)
CO2: 29 mmol/L (ref 22–32)
Calcium: 9.3 mg/dL (ref 8.9–10.3)
Chloride: 95 mmol/L — ABNORMAL LOW (ref 98–111)
Creatinine, Ser: 2.68 mg/dL — ABNORMAL HIGH (ref 0.61–1.24)
GFR calc Af Amer: 25 mL/min — ABNORMAL LOW (ref >60)
GFR calc non Af Amer: 22 mL/min — ABNORMAL LOW (ref >60)
Glucose, Bld: 190 mg/dL — ABNORMAL HIGH (ref 70–99)
Potassium: 4.2 mmol/L (ref 3.5–5.1)
Sodium: 138 mmol/L (ref 135–145)

## 2019-12-09 LAB — URIC ACID: Uric Acid, Serum: 14.1 mg/dL — ABNORMAL HIGH (ref 3.7–8.6)

## 2019-12-09 NOTE — Patient Instructions (Signed)
It was great to see you today! No medication changes are needed at this time.  Labs today We will only contact you if something comes back abnormal or we need to make some changes. Otherwise no news is good news!  Your physician recommends that you schedule a follow-up appointment in: 4 weeks with the CHF pharmacist  Your physician recommends that you schedule a follow-up appointment in: 8 weeks with Dr Haroldine Laws  Do the following things EVERYDAY: 1) Weigh yourself in the morning before breakfast. Write it down and keep it in a log. 2) Take your medicines as prescribed 3) Eat low salt foods--Limit salt (sodium) to 2000 mg per day.  4) Stay as active as you can everyday 5) Limit all fluids for the day to less than 2 liters  At the [facility], you and your health needs are our priority. As part of our continuing mission to provide you with exceptional heart care, we have created designated Provider Care Teams. These Care Teams include your primary Cardiologist (physician) and Advanced Practice Providers (APPs- Physician Assistants and Nurse Practitioners) who all work together to provide you with the care you need, when you need it.   You may see any of the following providers on your designated Care Team at your next follow up: [name] Dr Glori Bickers . Dr Loralie Champagne . Darrick Grinder, NP . Lyda Jester, PA . Audry Riles, PharmD   Please be sure to bring in all your medications bottles to every appointment.

## 2019-12-09 NOTE — Progress Notes (Addendum)
Advanced Heart Failure Clinic Note   Referring Physician: PCP: Monico Blitz, MD PCP-Cardiologist: Thompson Grayer, MD  AHF: Dr. 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   He initially presented to [facility] ED on 11/22/2019 for evaluation of fatigue and tarry stools. He reported worsening dyspnea + LE edema and exertional fatigue for the past month. NYHA IIIb-IV.No chest pain. He initially thought his symptoms might be secondary to a GI ulcer as he reports similar symptoms when he had significant anemia in the past.Not on a diuretic at home and reported his family doctor told him he had an"elevated creatinine" in the past.   Initial labsat APHshowed WBC 4.2, Hgb 11.2, platelets 121, Na+ 141, K+ 4.1 and creatinine 2.30. BNP 1194. Negative for COVID. Initial HS Troponin 565 with repeat of 506 -> 1,120Occult blood negative.CXR w/increased vascular congestion without edema and chronic trace pleural effusions.EKG showed NSR, HR 78 with PAC's with slight ST elevation along Lead III and minimal depression along lateral leads. Also had elevated Troponin c/w NSTEMI,565>>506>>1120. Placed on IV heparin.   Echo was done 4/27 with newly reduced EF 30-35% and moderate RV dysfunction. Previous echo 2013 EF 50-55%. Myoview 11/18 EF 52% with fixed inferolateral defect. His device was interrogated and did not show high burden RV pacing rate.   He was transferred to [facility] for further management and potential R/LHC given new drop in EF and elevated troponin. He was started on IV lasix and initially planned for R/L cath day of transfer but developed respiratory distress with CO2 retention requiring BIPAP. Respiratory status improved w/ diuresis  but he developed worsening renal function. SCr 1.98 on admit and SCr peaked at 2.8. Given elevated SCr, NST was opted for initial ischemic testing instead of LHC. NST showed large defect in the anterolateral (base,mid, distal), inferolateral (base, mid, distal)and inferior (base, mid) walls that improves minimally in inferior mid region consistent with large area of scar with minimal periinfarct ischemia. Small defect in the anterior mid region that improves consistent with a small region of mild ischemia. EF 28%.  It was suspected he likely had occluded his SVG- OM but, given small area of ischemia, risk of LHC outweighs benefit. Decision was made to treat medically.Can reconsider cath if he develops refractory angina. Plavix was added to ASA. Placed on statin. ? blocker continued. Imdur and hydralazine both started in place of ARB/ARNi. He was transitioned off of IV Lasix to 60 mg PO once daily. SCr had stabilized at 2.4. Also of note, he was on Actos for DM PTA. This was discontinued due to his HF. He was continued on Amaryl and instructed to f/u with his PCP for further management. He was also advised to f/u with GI as an outpatient.    Today in f/u, he reports that he has felt well since discharge. Denies dyspnea. No CP. Reports full med compliance. Tolerating well w/o side effects. BP stable. His wt has been stable at home but has 1+ LEE on exam. Denies orthopnea/PND.      Review of Systems: [y] = yes, [ ]  = no   General: Weight gain [ ] ; Weight loss [ ] ; Anorexia [ ] ; Fatigue [ ] ; Fever [ ] ; Chills [ ] ; Weakness [ ]   Cardiac:  Chest pain/pressure [ ] ; Resting SOB [ ] ; Exertional SOB [ ] ; Orthopnea [ ] ; Pedal Edema [ ] ; Palpitations [ ] ; Syncope [ ] ; Presyncope [ ] ; Paroxysmal nocturnal dyspnea[ ]   Pulmonary: Cough [ ] ; Wheezing[ ] ; Hemoptysis[ ] ; Sputum [ ] ; Snoring [ ]   GI: Vomiting[ ] ; Dysphagia[ ] ; Melena[ ] ; Hematochezia [ ] ; Heartburn[ ] ; Abdominal pain [ ] ; Constipation [ ] ; Diarrhea [ ] ;  BRBPR [ ]   GU: Hematuria[ ] ; Dysuria [ ] ; Nocturia[ ]   Vascular: Pain in legs with walking [ ] ; Pain in feet with lying flat [ ] ; Non-healing sores [ ] ; Stroke [ ] ; TIA [ ] ; Slurred speech [ ] ;  Neuro: Headaches[ ] ; Vertigo[ ] ; Seizures[ ] ; Paresthesias[ ] ;Blurred vision [ ] ; Diplopia [ ] ; Vision changes [ ]   Ortho/Skin: Arthritis [ ] ; Joint pain [ ] ; Muscle pain [ ] ; Joint swelling [ ] ; Back Pain [ ] ; Rash [ ]   Psych: Depression[ ] ; Anxiety[ ]   Heme: Bleeding problems [ ] ; Clotting disorders [ ] ; Anemia [ ]   Endocrine: Diabetes [ ] ; Thyroid dysfunction[ ]    Past Medical History:  Diagnosis Date  . AAA (abdominal aortic aneurysm) ([place])   . COPD (chronic obstructive pulmonary disease) ([place])   . Coronary artery disease 2000   Status post CABG  . Diabetes mellitus    DM2  . Dyslipidemia   . Dyspnea   . History of kidney stones   . Hypertension   . Obesity   . Osteoarthritis   . Pacemaker   . Prostate cancer ([place])   . Sleep apnea    USING CPAP  . Spinal stenosis   . SSS (sick sinus syndrome) ([place]) 2000   s/p PPM    Current Outpatient Medications  Medication Sig Dispense Refill  . alfuzosin (UROXATRAL) 10 MG 24 hr tablet TAKE 1 TABLET BY MOUTH EVERY DAY (Patient taking differently: Take 10 mg by mouth daily with breakfast. ) 90 tablet 3  . atorvastatin (LIPITOR) 40 MG tablet Take 40 mg by mouth at bedtime.     . clopidogrel (PLAVIX) 75 MG tablet Take 1 tablet (75 mg total) by mouth daily. 30 tablet 5  . furosemide (LASIX) 40 MG tablet Take 1.5 tablets (60 mg total) by mouth daily. 45 tablet 5  . hydrALAZINE (APRESOLINE) 25 MG tablet Take 0.5 tablets (12.5 mg total) by mouth every 8 (eight) hours. 90 tablet 5  . isosorbide mononitrate (IMDUR) 30 MG 24 hr tablet Take 1 tablet (30 mg total) by mouth daily. 30 tablet 5  . metoprolol succinate (TOPROL-XL) 50 MG 24 hr tablet Take 1 tablet (50 mg total) by mouth daily. Take with or immediately following a meal. 30 tablet 5  .  oxymetazoline (AFRIN) 0.05 % nasal spray Place 1 spray into both nostrils 2 (two) times daily as needed for congestion.    . pantoprazole (PROTONIX) 40 MG tablet TAKE 1 TABLET BY MOUTH EVERY DAY BEFORE BREAKFAST (Patient taking differently: Take 40 mg by mouth in the morning. ) 30 tablet 3  . spironolactone (ALDACTONE) 25 MG tablet Take 0.5 tablets (12.5 mg total) by mouth daily. 30 tablet 5  . tamsulosin (FLOMAX) 0.4 MG CAPS capsule Take 0.4 mg by mouth in the morning.     [name] acetaminophen (TYLENOL) 500 MG tablet Take 1,000 mg by mouth every 6 (six) hours as needed for moderate pain or headache.    [name] aspirin EC 81 MG tablet Take 1 tablet (81 mg total) by mouth daily. Restart in 2  weeks (Patient not taking: Reported on 12/09/2019)    . nitroGLYCERIN (NITROSTAT) 0.4 MG SL tablet Place 0.4 mg under the tongue every 5 (five) minutes as needed for chest pain.     No current facility-administered medications for this encounter.    No Known Allergies    Social History   Socioeconomic History  . Marital status: Married    Spouse name: Dot  . Number of children: 2  . Years of education: Not on file  . Highest education level: Not on file  Occupational History  . Occupation: Marine scientist: OTHER    Comment: [employer]   Tobacco Use  . Smoking status: Former Smoker    Packs/day: 1.00    Years: 42.00    Pack years: 42.00    Types: Cigarettes    Start date: 01/28/1961    Quit date: 10/30/1998    Years since quitting: 21.1  . Smokeless tobacco: Never Used  Substance and Sexual Activity  . Alcohol use: No    Alcohol/week: 0.0 standard drinks  . Drug use: No  . Sexual activity: Not Currently  Other Topics Concern  . Not on file  Social History Narrative   Lives with wife in a 2 story home.  They have 2 grown children.     Retired Software engineer.     Investment banker, operational of Radio broadcast assistant Strain:   . Difficulty of Paying Living Expenses:   Food Insecurity:   . Worried  About Charity fundraiser in the Last Year:   . Arboriculturist in the Last Year:   Transportation Needs:   . Film/video editor (Medical):   [name] Lack of Transportation (Non-Medical):   Physical Activity:   . Days of Exercise per Week:   . Minutes of Exercise per Session:   Stress:   . Feeling of Stress :   Social Connections:   . Frequency of Communication with Friends and Family:   . Frequency of Social Gatherings with Friends and Family:   . Attends Religious Services:   . Active Member of Clubs or Organizations:   . Attends Archivist Meetings:   [name] Marital Status:   Intimate Partner Violence:   . Fear of Current or Ex-Partner:   . Emotionally Abused:   [name] Physically Abused:   . Sexually Abused:       Family History  Problem Relation Age of Onset  . CAD Father   . Stroke Father        Deceased, 71  . Hypertension Mother   . Stroke Mother        Deceased, 71  . Heart disease Mother   . Stroke Brother        Deceased, 65  . Hypertension Sister        Deceased, 57  . Heart disease Sister   . Healthy Son   . Breast cancer Daughter   . Prostate cancer Neg Hx   . Colon cancer Neg Hx   . Pancreatic cancer Neg Hx     Vitals:   12/09/19 0836  BP: 124/82  Pulse: 84  SpO2: 92%  Weight: 95.3 kg (210 lb)     PHYSICAL EXAM: General:  Well appearing, moderately obese WM. No respiratory difficulty HEENT: normal Neck: supple. no JVD. Carotids 2+ bilat; no bruits. No lymphadenopathy or thyromegaly appreciated. Cor: PMI nondisplaced. Regular rate & rhythm. No rubs, gallops or murmurs. Lungs: clear Abdomen: soft, nontender, nondistended.  No hepatosplenomegaly. No bruits or masses. Good bowel sounds. Extremities: no cyanosis, clubbing, rash, 1+ LEE, R>L  Neuro: alert & oriented x 3, cranial nerves grossly intact. moves all 4 extremities w/o difficulty. Affect pleasant.  ECG: not performed    ASSESSMENT & PLAN:  1. 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  Volume overloaded on RHC 4/30 w/ preserved cardiac output.   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 NST 4/21 showed large defect in the anterolateral (base,mid, distal), inferolateral (base, mid, distal)and inferior (base, mid) walls that improves minimally in inferior mid region consistent with large area of scar with minimal periinfarct ischemia. Small defect in the anterior mid region that improves consistent with a small region of mild ischemia. EF 28%. Suspect he likely has occluded his SVG- OM.  - NYHA Class II-III w/ mild volume overload, 1+ LEE R>L - Continue Lasix 60 mg daily - Increase Spiro to 25 mg daily   - Check BMP today  - No ARB/ARNI due to CKD  - Continue hydralazine 12.5 mg daily  - Continue Imdur 30 mg daily  - Continue Toprol XL 50 mg daily.  - GRF borderline for SGTL2i (25) + w/ obesity/large panus, may be higher risk for GU infection. Will not initiate    - Discussed daily wts and low sodium diet - Continue gradual med titration, q 3-4 weeks.  - Plan repeat echo in 3 months. May need device upgrade to ICD if EF remains < 35%.   2. CAD - [place] CAD and is s/p CABG in 2000(LIMA to the LAD, saphenous vein graft to the OM1 and OM2 and posterior descending).  Low-risk NST in 2018 whopresented with worsening dyspnea on exertion, orthopnea and edema. - Recent Admit for Acute CHF. Troponin565>>506>>1120. No chest pain.  -Given NSTEMI and drop in EF, ideally would opt for coronary/graft angiography. However due to elevated SCr, he had NST for evaluation, showing large defect in the anterolateral (base,mid, distal), inferolateral (base, mid, distal)and inferior (base, mid) walls that improves minimally in inferior mid region consistent with large area of scar with minimal periinfarct ischemia. Small  defect in the anterior mid region that improves consistent with a small region of mild ischemia. EF 28% - Suspect he likely has occluded his SVG- OM but, given small area of ischemia and lack of chest pain, risk of LHC outweighs benefit. Decision was made to treat medically.  -  He is doing well w/o anginal symptoms.  - Can reconsider cath if he develops refractory angaina   - Continue ASA, Plavix, Statin ? blocker and spironolactone. No ARB w/ CKD.  - Will check CBC given DAPT   3. Ankle Pain - h/o gout. On loop diuretics  - check Uric Acid level   4. CKD - New SCr baseline ~2.4 - will repeat BMP today  - he is followed by nephrology   5. H/o Tarry Stools - he noted this on recent admit, however CBC was WNL and FOBT was negative - he denies any further recurrence but recently had Plavix added to ASA for NSTEMI - will repeat CBC today  - he has a gastroenterologist that he sees on a PRN basis   6. H/o Bradycardia - s/p PPM, followed by Dr. Rayann Heman   7. T2DM - followed by PCP - his Actos was  recently discontinued due to CHF   8. Deconditioning - continues w/ mild weakness and poor balance since hospital d/c - we have arranged home health PT/OT and they have contacted him to begin therapy   F/u w/ pharmD in 3-4 weeks for further med titration. We will try to increase spironolactone today. Try further increasing hydralazine/imdur if BP allows. F/u with Dr. Haroldine Laws in 8 weeks.    Lyda Jester, PA-C 12/09/19

## 2019-12-10 ENCOUNTER — Ambulatory Visit (INDEPENDENT_AMBULATORY_CARE_PROVIDER_SITE_OTHER): Payer: Medicare Other | Admitting: *Deleted

## 2019-12-10 DIAGNOSIS — I495 Sick sinus syndrome: Secondary | ICD-10-CM | POA: Diagnosis not present

## 2019-12-10 DIAGNOSIS — I442 Atrioventricular block, complete: Secondary | ICD-10-CM

## 2019-12-10 LAB — CUP PACEART REMOTE DEVICE CHECK
Battery Impedance: 323 Ohm
Battery Remaining Longevity: 103 mo
Battery Voltage: 2.78 V
Brady Statistic AP VP Percent: 2 %
Brady Statistic AP VS Percent: 79 %
Brady Statistic AS VP Percent: 0 %
Brady Statistic AS VS Percent: 19 %
Date Time Interrogation Session: 20210512131801
Implantable Lead Implant Date: 20000419
Implantable Lead Implant Date: 20000419
Implantable Lead Location: 753859
Implantable Lead Location: 753860
Implantable Lead Model: 4023
Implantable Pulse Generator Implant Date: 20150831
Lead Channel Impedance Value: 359 Ohm
Lead Channel Impedance Value: 455 Ohm
Lead Channel Pacing Threshold Amplitude: 0.5 V
Lead Channel Pacing Threshold Amplitude: 1 V
Lead Channel Pacing Threshold Pulse Width: 0.4 ms
Lead Channel Pacing Threshold Pulse Width: 0.4 ms
Lead Channel Setting Pacing Amplitude: 2 V
Lead Channel Setting Pacing Amplitude: 2.5 V
Lead Channel Setting Pacing Pulse Width: 0.4 ms
Lead Channel Setting Sensing Sensitivity: 4 mV

## 2019-12-12 NOTE — Progress Notes (Signed)
Remote pacemaker transmission.   

## 2019-12-17 ENCOUNTER — Encounter (HOSPITAL_COMMUNITY): Payer: Self-pay | Admitting: Cardiology

## 2019-12-17 DIAGNOSIS — I214 Non-ST elevation (NSTEMI) myocardial infarction: Secondary | ICD-10-CM | POA: Diagnosis not present

## 2020-01-03 DIAGNOSIS — M4800 Spinal stenosis, site unspecified: Secondary | ICD-10-CM | POA: Diagnosis not present

## 2020-01-03 DIAGNOSIS — R195 Other fecal abnormalities: Secondary | ICD-10-CM | POA: Diagnosis not present

## 2020-01-03 DIAGNOSIS — I5021 Acute systolic (congestive) heart failure: Secondary | ICD-10-CM | POA: Diagnosis not present

## 2020-01-03 DIAGNOSIS — D631 Anemia in chronic kidney disease: Secondary | ICD-10-CM | POA: Diagnosis not present

## 2020-01-03 DIAGNOSIS — E785 Hyperlipidemia, unspecified: Secondary | ICD-10-CM | POA: Diagnosis not present

## 2020-01-03 DIAGNOSIS — Z951 Presence of aortocoronary bypass graft: Secondary | ICD-10-CM | POA: Diagnosis not present

## 2020-01-03 DIAGNOSIS — L89322 Pressure ulcer of left buttock, stage 2: Secondary | ICD-10-CM | POA: Diagnosis not present

## 2020-01-03 DIAGNOSIS — I2510 Atherosclerotic heart disease of native coronary artery without angina pectoris: Secondary | ICD-10-CM | POA: Diagnosis not present

## 2020-01-03 DIAGNOSIS — E1122 Type 2 diabetes mellitus with diabetic chronic kidney disease: Secondary | ICD-10-CM | POA: Diagnosis not present

## 2020-01-03 DIAGNOSIS — J9602 Acute respiratory failure with hypercapnia: Secondary | ICD-10-CM | POA: Diagnosis not present

## 2020-01-03 DIAGNOSIS — C61 Malignant neoplasm of prostate: Secondary | ICD-10-CM | POA: Diagnosis not present

## 2020-01-03 DIAGNOSIS — G4733 Obstructive sleep apnea (adult) (pediatric): Secondary | ICD-10-CM | POA: Diagnosis not present

## 2020-01-03 DIAGNOSIS — I714 Abdominal aortic aneurysm, without rupture: Secondary | ICD-10-CM | POA: Diagnosis not present

## 2020-01-03 DIAGNOSIS — J9601 Acute respiratory failure with hypoxia: Secondary | ICD-10-CM | POA: Diagnosis not present

## 2020-01-03 DIAGNOSIS — I495 Sick sinus syndrome: Secondary | ICD-10-CM | POA: Diagnosis not present

## 2020-01-03 DIAGNOSIS — Z6832 Body mass index (BMI) 32.0-32.9, adult: Secondary | ICD-10-CM | POA: Diagnosis not present

## 2020-01-03 DIAGNOSIS — Z950 Presence of cardiac pacemaker: Secondary | ICD-10-CM | POA: Diagnosis not present

## 2020-01-03 DIAGNOSIS — N183 Chronic kidney disease, stage 3 unspecified: Secondary | ICD-10-CM | POA: Diagnosis not present

## 2020-01-03 DIAGNOSIS — I130 Hypertensive heart and chronic kidney disease with heart failure and stage 1 through stage 4 chronic kidney disease, or unspecified chronic kidney disease: Secondary | ICD-10-CM | POA: Diagnosis not present

## 2020-01-03 DIAGNOSIS — N179 Acute kidney failure, unspecified: Secondary | ICD-10-CM | POA: Diagnosis not present

## 2020-01-03 DIAGNOSIS — I214 Non-ST elevation (NSTEMI) myocardial infarction: Secondary | ICD-10-CM | POA: Diagnosis not present

## 2020-01-03 DIAGNOSIS — J449 Chronic obstructive pulmonary disease, unspecified: Secondary | ICD-10-CM | POA: Diagnosis not present

## 2020-01-03 DIAGNOSIS — Z87442 Personal history of urinary calculi: Secondary | ICD-10-CM | POA: Diagnosis not present

## 2020-01-03 DIAGNOSIS — E669 Obesity, unspecified: Secondary | ICD-10-CM | POA: Diagnosis not present

## 2020-01-03 DIAGNOSIS — M1990 Unspecified osteoarthritis, unspecified site: Secondary | ICD-10-CM | POA: Diagnosis not present

## 2020-01-06 ENCOUNTER — Inpatient Hospital Stay (HOSPITAL_COMMUNITY)
Admission: RE | Admit: 2020-01-06 | Discharge: 2020-01-06 | Disposition: A | Discharge status: Home / Self Care | Payer: Medicare Other | Source: Ambulatory Visit

## 2020-01-13 NOTE — Progress Notes (Signed)
Advanced Heart Failure Clinic Note   Referring Physician: PCP: Monico Blitz, MD PCP-Cardiologist: Thompson Grayer, MD  AHF: Dr. Haroldine Laws   HPI:  Mr. [name] a 78 y.o.male, retired Water engineer CAD (s/p CABG in 2000), symptomatic bradycardia (s/p PPM placement), HTN, HLD, Type 2 DM,OSA prostate cancer and new systolic heart failure, who presents to clinic today for post hospital f/u after recently admission for acute CHF.   He initially presented to [facility] ED on 11/22/2019 for evaluation of fatigue and tarry stools. He reported worsening dyspnea + LE edema and exertional fatigue for the past month. NYHA IIIb-IV.No chest pain. He initially thought his symptoms might be secondary to a GI ulcer as he reports similar symptoms when he had significant anemia in the past.Not on a diuretic at home and reported his family doctor told him he had an"elevated creatinine" in the past.   Initial labsat APHshowed WBC 4.2, Hgb 11.2, platelets 121, Na+ 141, K+ 4.1 and creatinine 2.30. BNP 1194. Negative for COVID. Initial HS Troponin 565 with repeat of 506 -> 1,120Occult blood negative.CXR w/increased vascular congestion without edema and chronic trace pleural effusions.EKG showed NSR, HR 78 with PAC's with slight ST elevation along Lead III and minimal depression along lateral leads. Also had elevated Troponin c/w NSTEMI,565>>506>>1120. Placed on IV heparin.   Echo was done 4/27 with newly reduced EF 30-35% and moderate RV dysfunction. Previous echo 2013 EF 50-55%. Myoview 11/18 EF 52% with fixed inferolateral defect. His device was interrogated and did not show high burden RV pacing rate.   He was transferred to [facility] for further management and potential R/LHC given new drop in EF and elevated troponin. He was started on IV lasix and initially planned for R/L cath day of transfer but developed respiratory distress with CO2 retention requiring BIPAP. Respiratory status improved w/ diuresis  but he developed worsening renal function. SCr 1.98 on admit and SCr peaked at 2.8. Given elevated SCr, NST was opted for initial ischemic testing instead of LHC. NST showed large defect in the anterolateral (base,mid, distal), inferolateral (base, mid, distal)and inferior (base, mid) walls that improves minimally in inferior mid region consistent with large area of scar with minimal periinfarct ischemia. Small defect in the anterior mid region that improves consistent with a small region of mild ischemia. EF 28%.  It was suspected he likely had occluded his SVG- OM but, given small area of ischemia, risk of LHC outweighs benefit. Decision was made to treat medically.Can reconsider cath if he develops refractory angina. Plavix was added to ASA. Placed on statin. ? blocker continued. Imdur and hydralazine both started in place of ARB/ARNi. He was transitioned off of IV Lasix to 60 mg PO once daily. SCr had stabilized at 2.4. Also of note, he was on Actos for DM PTA. This was discontinued due to his HF. He was continued on Amaryl and instructed to f/u with his PCP for further management. He was also advised to f/u with GI as an outpatient.    Here for today for f/u. Says he feels pretty good. Main complaint is chronic back pain. No CP or SOB. No edema, orthopnea or PND. Weight stable at home at 190. (down about 12 pounds over last month)  Mildly dizzy on standing   Past Medical History:  Diagnosis Date   AAA (abdominal aortic aneurysm) (HCC)    COPD (chronic obstructive pulmonary disease) ([place])    Coronary artery disease 2000   Status post CABG   Diabetes mellitus  DM2   Dyslipidemia    Dyspnea    History of kidney stones    Hypertension    Obesity    Osteoarthritis    Pacemaker    Prostate cancer ([place])    Sleep apnea    USING CPAP   Spinal stenosis    SSS (sick sinus syndrome) (HCC) 2000   s/p PPM    Current Outpatient Medications  Medication Sig Dispense Refill    aspirin EC 81 MG tablet Take 1 tablet (81 mg total) by mouth daily. Restart in 2 weeks     atorvastatin (LIPITOR) 40 MG tablet Take 40 mg by mouth at bedtime.      clopidogrel (PLAVIX) 75 MG tablet Take 1 tablet (75 mg total) by mouth daily. 30 tablet 5   furosemide (LASIX) 40 MG tablet Take 1.5 tablets (60 mg total) by mouth daily. 45 tablet 5   glimepiride (AMARYL) 2 MG tablet Take 2 mg by mouth daily with breakfast.     hydrALAZINE (APRESOLINE) 25 MG tablet Take 0.5 tablets (12.5 mg total) by mouth every 8 (eight) hours. 90 tablet 5   isosorbide mononitrate (IMDUR) 30 MG 24 hr tablet Take 1 tablet (30 mg total) by mouth daily. 30 tablet 5   metoprolol succinate (TOPROL-XL) 50 MG 24 hr tablet Take 1 tablet (50 mg total) by mouth daily. Take with or immediately following a meal. 30 tablet 5   oxymetazoline (AFRIN) 0.05 % nasal spray Place 1 spray into both nostrils 2 (two) times daily as needed for congestion.     pantoprazole (PROTONIX) 40 MG tablet TAKE 1 TABLET BY MOUTH EVERY DAY BEFORE BREAKFAST (Patient taking differently: Take 40 mg by mouth in the morning. ) 30 tablet 3   spironolactone (ALDACTONE) 25 MG tablet Take 0.5 tablets (12.5 mg total) by mouth daily. 30 tablet 5   tamsulosin (FLOMAX) 0.4 MG CAPS capsule Take 0.4 mg by mouth in the morning.      acetaminophen (TYLENOL) 500 MG tablet Take 1,000 mg by mouth every 6 (six) hours as needed for moderate pain or headache. (Patient not taking: Reported on 01/14/2020)     alfuzosin (UROXATRAL) 10 MG 24 hr tablet TAKE 1 TABLET BY MOUTH EVERY DAY (Patient not taking: Reported on 01/14/2020) 90 tablet 3   nitroGLYCERIN (NITROSTAT) 0.4 MG SL tablet Place 0.4 mg under the tongue every 5 (five) minutes as needed for chest pain. (Patient not taking: Reported on 01/14/2020)     No current facility-administered medications for this encounter.    No Known Allergies    Social History   Socioeconomic History   Marital status: Married      Spouse name: Dot   Number of children: 2   Years of education: Not on file   Highest education level: Not on file  Occupational History   Occupation: Marine scientist: OTHER    Comment: [employer]   Tobacco Use   Smoking status: Former Smoker    Packs/day: 1.00    Years: 42.00    Pack years: 42.00    Types: Cigarettes    Start date: 01/28/1961    Quit date: 10/30/1998    Years since quitting: 21.2   Smokeless tobacco: Never Used  Vaping Use   Vaping Use: Never used  Substance and Sexual Activity   Alcohol use: No    Alcohol/week: 0.0 standard drinks   Drug use: No   Sexual activity: Not Currently  Other Topics Concern   Not  on file  Social History Narrative   Lives with wife in a 2 story home.  They have 2 grown children.     Retired Software engineer.     [place] Strain:    Difficulty of Paying Living Expenses:   Food Insecurity:    Worried About Charity fundraiser in the Last Year:    Arboriculturist in the Last Year:   Transportation Needs:    Film/video editor (Medical):    Lack of Transportation (Non-Medical):   Physical Activity:    Days of Exercise per Week:    Minutes of Exercise per Session:   Stress:    Feeling of Stress :   Social Connections:    Frequency of Communication with Friends and Family:    Frequency of Social Gatherings with Friends and Family:    Attends Religious Services:    Active Member of Clubs or Organizations:    Attends Music therapist:    Marital Status:   Intimate Partner Violence:    Fear of Current or Ex-Partner:    Emotionally Abused:    Physically Abused:    Sexually Abused:       Family History  Problem Relation Age of Onset   CAD Father    Stroke Father        Deceased, 47   Hypertension Mother    Stroke Mother        Deceased, 45   Heart disease Mother    Stroke Brother        Deceased, 51   Hypertension Sister         Deceased, 57   Heart disease Sister    Healthy Son    Breast cancer Daughter    Prostate cancer Neg Hx    Colon cancer Neg Hx    Pancreatic cancer Neg Hx     Vitals:   01/14/20 1047  BP: (!) 78/56  Pulse: 73  SpO2: 96%  Weight: 90.2 kg (198 lb 12.8 oz)  Height: 5\' 7"  (1.702 m)     PHYSICAL EXAM: General:  Well appearing. No resp difficulty HEENT: normal Neck: supple. no JVD. Carotids 2+ bilat; no bruits. No lymphadenopathy or thryomegaly appreciated. Cor: PMI nondisplaced. Regular rate & rhythm. No rubs, gallops or murmurs. Lungs: clear Abdomen: soft, nontender, nondistended. No hepatosplenomegaly. No bruits or masses. Good bowel sounds. Extremities: no cyanosis, clubbing, rash, edema Neuro: alert & orientedx3, cranial nerves grossly intact. moves all 4 extremities w/o difficulty. Affect pleasant    ASSESSMENT & PLAN:  1. Chronic Systolic Heart Failure  - Echo 11/25/19 with LVEF at 30-35% and global hypokinesis(previous EF 52% in 2018), - RHC 4/30 w/ preserved cardiac output.   - no high burden RV pacing on device interrogation  - Cath deferred due to CKD in 4/21. Myoview 4/21 showed large defect in the anterolateral (base,mid, distal), inferolateral (base, mid, distal)and inferior (base, mid) walls that improves minimally in inferior mid region consistent with large area of scar with minimal periinfarct ischemia. EF 28%. Suspect he likely has occluded his SVG- OM.  - Improved. NYHA II - BP quite low today  - Decrease Lasix 60 mg daily -> 40mg  daily - Continue Spiro 25 mg daily   - No ARB/ARNI due to CKD  - Stop hydralazine 12.5 mg daily and  Imdur 30 mg daily  - Continue Toprol XL 50 mg daily.  - Plan repeat echo in 3 months. May  need device upgrade to ICD if EF remains < 35%.   2. CAD - [place] CAD and is s/p CABG in 2000(LIMA to the LAD, saphenous vein graft to the OM1 and OM2 and posterior descending).  Low-risk NST in 2018 whopresented with  worsening dyspnea on exertion, orthopnea and edema. - Recent Admit for Acute CHF. Troponin565>>506>>1120. No chest pain.  -Given NSTEMI and drop in EF, ideally would opt for coronary/graft angiography. However due to elevated SCr, he had Myoview which shoed large scar and minimal  - No current s/s angina - Can reconsider cath if he develops refractory angaina   - Continue ASA, Plavix, Statin ? blocker and spironolactone. No ARB w/ CKD.    4. CKD IV - SCr baseline now ~2.4. Last creatinine 2.7 - will repeat BMP today  - he is followed by nephrology   5. H/o Bradycardia - s/p PPM, followed by Dr. Rayann Heman   6. T2DM - followed by PCP - his Actos was recently discontinued due to CHF  - If we can get creatinine stabilized I would like to get him off lasix and start Farxiga. Will have him see PharmD in 4 weeks    Glori Bickers, MD 01/14/20

## 2020-01-14 ENCOUNTER — Encounter (HOSPITAL_COMMUNITY): Payer: Self-pay | Admitting: Internal Medicine

## 2020-01-14 ENCOUNTER — Other Ambulatory Visit: Payer: Self-pay

## 2020-01-14 ENCOUNTER — Ambulatory Visit (HOSPITAL_COMMUNITY)
Admission: RE | Admit: 2020-01-14 | Discharge: 2020-01-14 | Disposition: A | Discharge status: Home / Self Care | Payer: Medicare Other | Source: Ambulatory Visit | Attending: Internal Medicine | Admitting: Internal Medicine

## 2020-01-14 VITALS — BP 78/56 | HR 73 | Ht 67.0 in | Wt 198.8 lb

## 2020-01-14 DIAGNOSIS — E669 Obesity, unspecified: Secondary | ICD-10-CM | POA: Diagnosis not present

## 2020-01-14 DIAGNOSIS — M549 Dorsalgia, unspecified: Secondary | ICD-10-CM | POA: Diagnosis not present

## 2020-01-14 DIAGNOSIS — Z7982 Long term (current) use of aspirin: Secondary | ICD-10-CM | POA: Insufficient documentation

## 2020-01-14 DIAGNOSIS — Z79899 Other long term (current) drug therapy: Secondary | ICD-10-CM | POA: Insufficient documentation

## 2020-01-14 DIAGNOSIS — E785 Hyperlipidemia, unspecified: Secondary | ICD-10-CM | POA: Insufficient documentation

## 2020-01-14 DIAGNOSIS — G8929 Other chronic pain: Secondary | ICD-10-CM | POA: Diagnosis not present

## 2020-01-14 DIAGNOSIS — I2510 Atherosclerotic heart disease of native coronary artery without angina pectoris: Secondary | ICD-10-CM | POA: Diagnosis not present

## 2020-01-14 DIAGNOSIS — M1990 Unspecified osteoarthritis, unspecified site: Secondary | ICD-10-CM | POA: Insufficient documentation

## 2020-01-14 DIAGNOSIS — R42 Dizziness and giddiness: Secondary | ICD-10-CM | POA: Insufficient documentation

## 2020-01-14 DIAGNOSIS — Z7902 Long term (current) use of antithrombotics/antiplatelets: Secondary | ICD-10-CM | POA: Diagnosis not present

## 2020-01-14 DIAGNOSIS — G4733 Obstructive sleep apnea (adult) (pediatric): Secondary | ICD-10-CM | POA: Diagnosis not present

## 2020-01-14 DIAGNOSIS — Z7901 Long term (current) use of anticoagulants: Secondary | ICD-10-CM | POA: Diagnosis not present

## 2020-01-14 DIAGNOSIS — N184 Chronic kidney disease, stage 4 (severe): Secondary | ICD-10-CM

## 2020-01-14 DIAGNOSIS — Z951 Presence of aortocoronary bypass graft: Secondary | ICD-10-CM | POA: Diagnosis not present

## 2020-01-14 DIAGNOSIS — J449 Chronic obstructive pulmonary disease, unspecified: Secondary | ICD-10-CM | POA: Insufficient documentation

## 2020-01-14 DIAGNOSIS — I5022 Chronic systolic (congestive) heart failure: Secondary | ICD-10-CM | POA: Insufficient documentation

## 2020-01-14 DIAGNOSIS — L905 Scar conditions and fibrosis of skin: Secondary | ICD-10-CM | POA: Insufficient documentation

## 2020-01-14 DIAGNOSIS — Z8249 Family history of ischemic heart disease and other diseases of the circulatory system: Secondary | ICD-10-CM | POA: Insufficient documentation

## 2020-01-14 DIAGNOSIS — I130 Hypertensive heart and chronic kidney disease with heart failure and stage 1 through stage 4 chronic kidney disease, or unspecified chronic kidney disease: Secondary | ICD-10-CM | POA: Diagnosis not present

## 2020-01-14 DIAGNOSIS — Z87891 Personal history of nicotine dependence: Secondary | ICD-10-CM | POA: Diagnosis not present

## 2020-01-14 DIAGNOSIS — Z950 Presence of cardiac pacemaker: Secondary | ICD-10-CM | POA: Insufficient documentation

## 2020-01-14 DIAGNOSIS — Z7984 Long term (current) use of oral hypoglycemic drugs: Secondary | ICD-10-CM | POA: Diagnosis not present

## 2020-01-14 DIAGNOSIS — E1122 Type 2 diabetes mellitus with diabetic chronic kidney disease: Secondary | ICD-10-CM | POA: Insufficient documentation

## 2020-01-14 LAB — BASIC METABOLIC PANEL
Anion gap: 12 (ref 5–15)
BUN: 69 mg/dL — ABNORMAL HIGH (ref 8–23)
CO2: 23 mmol/L (ref 22–32)
Calcium: 9.4 mg/dL (ref 8.9–10.3)
Chloride: 102 mmol/L (ref 98–111)
Creatinine, Ser: 3.18 mg/dL — ABNORMAL HIGH (ref 0.61–1.24)
GFR calc Af Amer: 21 mL/min — ABNORMAL LOW (ref >60)
GFR calc non Af Amer: 18 mL/min — ABNORMAL LOW (ref >60)
Glucose, Bld: 142 mg/dL — ABNORMAL HIGH (ref 70–99)
Potassium: 4.3 mmol/L (ref 3.5–5.1)
Sodium: 137 mmol/L (ref 135–145)

## 2020-01-14 LAB — BRAIN NATRIURETIC PEPTIDE: B Natriuretic Peptide: 713.7 pg/mL — ABNORMAL HIGH (ref 0.0–100.0)

## 2020-01-14 MED ORDER — FUROSEMIDE 40 MG PO TABS: 40.0000 mg | ORAL_TABLET | Freq: Every day | ORAL | 3 refills | Status: DC

## 2020-01-14 NOTE — Patient Instructions (Addendum)
Labs done today. We will contact you only if your labs are abnormal.  STOP taking Hydralazine   STOP taking Imdur  DECREASE Lasix take 40mg (1 tablet) by mouth daily  No other medication changes were made. Please continue all other medications as prescribed.  Your physician recommends that you schedule a follow-up appointment in: 1 month with our Clinic Pharmacist, Lauren and in 3 months with Dr. Haroldine Laws  If you have any questions or concerns before your next appointment please send [place] a message through [facility] or call our office at [phone].    TO LEAVE A MESSAGE FOR THE NURSE SELECT OPTION 2, PLEASE LEAVE A MESSAGE INCLUDING: . YOUR NAME . DATE OF BIRTH . CALL BACK NUMBER . REASON FOR CALL**this is important as we prioritize the call backs  YOU WILL RECEIVE A CALL BACK THE SAME DAY AS LONG AS YOU CALL BEFORE 4:00 PM   Do the following things EVERYDAY: 1) Weigh yourself in the morning before breakfast. Write it down and keep it in a log. 2) Take your medicines as prescribed 3) Eat low salt foods--Limit salt (sodium) to 2000 mg per day.  4) Stay as active as you can everyday 5) Limit all fluids for the day to less than 2 liters   At the [facility], you and your health needs are our priority. As part of our continuing mission to provide you with exceptional heart care, we have created designated Provider Care Teams. These Care Teams include your primary Cardiologist (physician) and Advanced Practice Providers (APPs- Physician Assistants and Nurse Practitioners) who all work together to provide you with the care you need, when you need it.   You may see any of the following providers on your designated Care Team at your next follow up: [name] Dr Glori Bickers . Dr Loralie Champagne . Darrick Grinder, NP . Lyda Jester, PA . Audry Riles, PharmD   Please be sure to bring in all your medications bottles to every appointment.

## 2020-01-14 NOTE — Addendum Note (Signed)
Encounter addended by: Shonna Chock, CMA on: 4/85/9276 11:25 AM  Actions taken: Diagnosis association updated, Pharmacy for encounter modified, Order list changed, Medication long-term status modified, Clinical Note Signed, Charge Capture section accepted

## 2020-01-26 ENCOUNTER — Telehealth (HOSPITAL_COMMUNITY): Payer: Self-pay | Admitting: *Deleted

## 2020-01-26 NOTE — Telephone Encounter (Signed)
Pt called this AM concerned about low BP, he stated it has been running mainly in the 70s at home and he does feel lightheaded at times. At last OV BP was 78/56, Hydralazine & Imdur both D/C'd and Furosemide was decreased to 40 mg QD, lab work that day revealed Cr 3.18. Pt states he went for repeat labs last week at [facility] and got lab results faxed to [place]:  01/20/20:  BUN  59 Cr  2.62 (around pt's baseline)  Discussed all above w/Dr Bensimhon, he advised pt to stop Arlyce Harman.  Called and spoke w/pt, he is aware, agreeable and verbalized understanding, he will continue to monitor BP

## 2020-01-30 NOTE — Addendum Note (Signed)
Encounter addended by: Orma Render, RPH-CPP on: 01/30/2020 1:10 PM  Actions taken: Delete clinical note

## 2020-02-10 ENCOUNTER — Other Ambulatory Visit (HOSPITAL_COMMUNITY): Payer: Self-pay | Admitting: Internal Medicine

## 2020-02-18 DIAGNOSIS — E1165 Type 2 diabetes mellitus with hyperglycemia: Secondary | ICD-10-CM | POA: Diagnosis not present

## 2020-02-18 DIAGNOSIS — Z6833 Body mass index (BMI) 33.0-33.9, adult: Secondary | ICD-10-CM | POA: Diagnosis not present

## 2020-02-18 DIAGNOSIS — E1122 Type 2 diabetes mellitus with diabetic chronic kidney disease: Secondary | ICD-10-CM | POA: Diagnosis not present

## 2020-02-18 DIAGNOSIS — E1142 Type 2 diabetes mellitus with diabetic polyneuropathy: Secondary | ICD-10-CM | POA: Diagnosis not present

## 2020-02-18 DIAGNOSIS — M549 Dorsalgia, unspecified: Secondary | ICD-10-CM | POA: Diagnosis not present

## 2020-02-18 DIAGNOSIS — Z299 Encounter for prophylactic measures, unspecified: Secondary | ICD-10-CM | POA: Diagnosis not present

## 2020-02-18 DIAGNOSIS — I5032 Chronic diastolic (congestive) heart failure: Secondary | ICD-10-CM | POA: Diagnosis not present

## 2020-02-23 ENCOUNTER — Other Ambulatory Visit: Payer: Self-pay

## 2020-02-23 ENCOUNTER — Inpatient Hospital Stay (HOSPITAL_COMMUNITY)
Admission: RE | Admit: 2020-02-23 | Discharge: 2020-02-23 | Disposition: A | Discharge status: Home / Self Care | Payer: Medicare Other | Source: Ambulatory Visit

## 2020-02-23 ENCOUNTER — Ambulatory Visit (INDEPENDENT_AMBULATORY_CARE_PROVIDER_SITE_OTHER): Payer: Medicare Other | Admitting: Gastroenterology

## 2020-02-23 ENCOUNTER — Encounter (INDEPENDENT_AMBULATORY_CARE_PROVIDER_SITE_OTHER): Payer: Self-pay | Admitting: Gastroenterology

## 2020-02-23 VITALS — BP 115/74 | HR 87 | Temp 98.1°F | Ht 67.0 in | Wt 196.9 lb

## 2020-02-23 DIAGNOSIS — K259 Gastric ulcer, unspecified as acute or chronic, without hemorrhage or perforation: Secondary | ICD-10-CM | POA: Diagnosis not present

## 2020-02-23 DIAGNOSIS — K219 Gastro-esophageal reflux disease without esophagitis: Secondary | ICD-10-CM

## 2020-02-23 MED ORDER — PANTOPRAZOLE SODIUM 40 MG PO TBEC: DELAYED_RELEASE_TABLET | ORAL | 3 refills | Status: DC

## 2020-02-23 NOTE — Progress Notes (Signed)
Patient profile: [name] is a 79 y.o. adult seen for evaluation of follow-up, last seen in clinic August 2020.   Up dates ot PMHx sicne last OV include admission 11/2019 for NSTEMI with NST and recommendations for medical treatment given risk of LHC. Began plavix in addition to aspirin, as well as Imdur, hydralazine, statin & beta blocker, lasix. Reports in June 2021 had hypotension and increase in Cr - stopped imdur, hydralazine and decreased lasix dose.  He reports follow-up labs at [facility] with improvement in renal function.  History of Present Illness: [name] is seen today for follow up. Taking protonix once a day in the morning, with medication not feeling any GERD symptom. Denies n/v/dysphagia. Very rare epigastric pain/bloating that is mild and likely food related.    He is having a BM 1-2x/day, denies constipation/diarrhea. No blood in stool or dark stool. Takes miralax as needed 1-2x/week to avoid straining.    Occasional ibuprofen at bedtime (1-2x/week). Non smoker. No alcohol. 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   Wt Readings from Last 3 Encounters:  02/23/20 196 lb 14.4 oz (89.3 kg)  01/14/20 198 lb 12.8 oz (90.2 kg)  12/09/19 210 lb (95.3 kg)  Per patient weight loss was fluid removal during hospitalizaiton. Appetite good.   Last Colonoscopy: 2011-no polyps, fair prep right side.   Last Endoscopy: 03/2019-- Normal esophagus.                           - Z-line irregular.                           - A few gastric polyps.                           - Scar in the incisura.                           - Duodenitis with non-critical narrowing.                           - Normal second portion of the duodenum.                           - No specimens collected.   09/2018-- Normal esophagus. - Z-line regular, 40 cm from the incisors. - Non-bleeding gastric ulcer with pigmented material. - Non-bleeding deep duodenal ulcers with pigmented material. - Duodenitis. - No  specimens collected.   Past Medical History:  Past Medical History:  Diagnosis Date  . AAA (abdominal aortic aneurysm) ([place])   . COPD (chronic obstructive pulmonary disease) ([place])   . Coronary artery disease 2000   Status post CABG  . Diabetes mellitus    DM2  . Dyslipidemia   . Dyspnea   . History of kidney stones   . Hypertension   . Obesity   . Osteoarthritis   . Pacemaker   . Prostate cancer ([place])   . Sleep apnea    USING CPAP  . Spinal stenosis   . SSS (sick sinus syndrome) ([place]) 2000   s/p PPM    Problem List: Patient Active Problem List   Diagnosis Date Noted  . NSTEMI (non-ST elevated myocardial infarction) ([place]) 11/22/2019  . Gastric ulcer 02/11/2019  . Acute GI  bleeding 10/23/2018  . Hypotension 10/23/2018  . AAA (abdominal aortic aneurysm) ([place]) 10/23/2018  . Sleep apnea 10/23/2018  . Type 2 Diabetes mellitus 10/23/2018  . COPD (chronic obstructive pulmonary disease) ([place]) 10/23/2018  . Pacemaker 10/23/2018  . Passage of bloody stools 10/23/2018  . CKD (chronic kidney disease), stage III 10/23/2018  . Acute renal failure superimposed on stage 3 chronic kidney disease ([place]) 10/23/2018  . UTI (urinary tract infection) 10/23/2018  . Sepsis ([place]) 10/23/2018  . Elevated lactic acid level 10/23/2018  . Pressure injury of skin 10/23/2018  . Hyperkalemia 10/23/2018  . Malignant neoplasm of prostate ([place]) 03/13/2018  . OA (osteoarthritis) of hip 07/18/2017  . 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 Right foot drop 11/18/2015  . Hx of decompressive lumbar laminectomy 11/18/2015  . Diabetic polyneuropathy associated with diabetes mellitus due to underlying condition ([place]) 11/18/2015  . Morbid obesity ([place]) 08/06/2015  . ESSENTIAL HYPERTENSION, BENIGN 10/22/2009  . Coronary atherosclerosis 10/22/2009  . SSS (sick sinus syndrome) ([place]) 10/22/2009    Past Surgical History: Past Surgical History:  Procedure Laterality Date  . BIOPSY PROSTATE  01/09/2018    . CATARACT EXTRACTION Left   . CORONARY ARTERY BYPASS GRAFT     2000  . ESOPHAGOGASTRODUODENOSCOPY N/A 10/24/2018   Procedure: ESOPHAGOGASTRODUODENOSCOPY (EGD);  Surgeon: Rogene Houston, MD;  Location[facility];  Service: Endoscopy;  Laterality: N/A;  . ESOPHAGOGASTRODUODENOSCOPY N/A 03/19/2019   Procedure: ESOPHAGOGASTRODUODENOSCOPY (EGD);  Surgeon: Rogene Houston, MD;  Location: [facility];  Service: Endoscopy;  Laterality: N/A;  255  . JOINT REPLACEMENT     L hip Dr. Wynelle Link 07-18-17  . L5-S1 Gill decompressive laminectomy    . PACEMAKER INSERTION     MDT implanted for sick sinus syndrome  . PERMANENT PACEMAKER GENERATOR CHANGE N/A 03/30/2014   MDT Adapta L generator change by Dr Rayann Heman  . RADIOACTIVE SEED IMPLANT N/A 06/10/2018   Procedure: RADIOACTIVE SEED IMPLANT/BRACHYTHERAPY IMPLANT;  Surgeon: Cleon Gustin, MD;  Location: [facility];  Service: Urology;  Laterality: N/A;  . REPLACEMENT TOTAL KNEE BILATERAL     BIL  . RIGHT HEART CATH N/A 11/28/2019   Procedure: RIGHT HEART CATH;  Surgeon: Jolaine Artist, MD;  Location: [facility];  Service: Cardiovascular;  Laterality: N/A;  . Right total hip arthroplasty.    . SPACE OAR INSTILLATION N/A 06/10/2018   Procedure: SPACE OAR INSTILLATION;  Surgeon: Cleon Gustin, MD;  Location: [facility];  Service: Urology;  Laterality: N/A;  . TOTAL HIP ARTHROPLASTY Left 07/18/2017   Procedure: LEFT TOTAL HIP ARTHROPLASTY ANTERIOR APPROACH;  Surgeon: [name], MD;  Location: [facility];  Service: Orthopedics;  Laterality: Left;  . TRANSRECTAL ULTRASOUND  01/09/2018    Allergies: No Known Allergies    Home Medications:  Current Outpatient Medications:  .  acetaminophen (TYLENOL) 500 MG tablet, Take 1,000 mg by mouth every 6 (six) hours as needed for moderate pain or headache. , Disp: , Rfl:  .  aspirin EC 81 MG tablet, Take 1 tablet (81 mg total) by mouth daily. Restart in 2 weeks, Disp: , Rfl:  .  atorvastatin  (LIPITOR) 40 MG tablet, Take 40 mg by mouth at bedtime. , Disp: , Rfl:  .  clopidogrel (PLAVIX) 75 MG tablet, Take 1 tablet (75 mg total) by mouth daily., Disp: 30 tablet, Rfl: 5 .  furosemide (LASIX) 40 MG tablet, Take 1 tablet (40 mg total) by mouth daily., Disp: 90 tablet, Rfl: 3 .  glimepiride (AMARYL) 2 MG tablet, Take 2 mg by mouth daily with breakfast., Disp: , Rfl:  .  metoprolol succinate (TOPROL-XL) 50 MG 24 hr tablet, Take 1 tablet (50 mg total) by mouth daily. Take with or immediately following a meal., Disp: 30 tablet, Rfl: 5 .  pantoprazole (PROTONIX) 40 MG tablet, TAKE 1 TABLET BY MOUTH EVERY DAY BEFORE BREAKFAST, Disp: 90 tablet, Rfl: 3 .  tamsulosin (FLOMAX) 0.4 MG CAPS capsule, Take 0.4 mg by mouth in the morning. , Disp: , Rfl:  .  alfuzosin (UROXATRAL) 10 MG 24 hr tablet, TAKE 1 TABLET BY MOUTH EVERY DAY (Patient not taking: Reported on 01/14/2020), Disp: 90 tablet, Rfl: 3 .  nitroGLYCERIN (NITROSTAT) 0.4 MG SL tablet, Place 0.4 mg under the tongue every 5 (five) minutes as needed for chest pain. (Patient not taking: Reported on 01/14/2020), Disp: , Rfl:  .  oxymetazoline (AFRIN) 0.05 % nasal spray, Place 1 spray into both nostrils 2 (two) times daily as needed for congestion. (Patient not taking: Reported on 02/23/2020), Disp: , Rfl:    Family History: family history includes Breast cancer in her daughter; CAD in her father; Healthy in her son; Heart disease in her mother and sister; Hypertension in her mother and sister; Stroke in her brother, father, and mother.    Social History:   reports that she quit smoking about 21 years ago. Her smoking use included cigarettes. She started smoking about 59 years ago. She has a 42.00 pack-year smoking history. She has never used smokeless tobacco. She reports that she does not drink alcohol and does not use drugs.   Review of Systems: Constitutional: Denies weight loss/weight gain  Eyes: No changes in vision. ENT: No oral lesions, sore  throat.  GI: see HPI.  Heme/Lymph: No easy bruising.  CV: No chest pain.  GU: No hematuria.  Integumentary: No rashes.  Neuro: No headaches.  Psych: No depression/anxiety.  Endocrine: No heat/cold intolerance.  Allergic/Immunologic: No urticaria.  Resp: No cough, SOB.  Musculoskeletal: No joint swelling.    Physical Examination: BP 115/74 (BP Location: Right Arm, Patient Position: Sitting, Cuff Size: Normal)   Pulse 87   Temp 98.1 F (36.7 C) (Oral)   Ht 5\' 7"  (1.702 m)   Wt 196 lb 14.4 oz (89.3 kg)   BMI 30.84 kg/m  Gen: NAD, alert and oriented x 4, ambulate w/ cane HEENT: PEERLA, EOMI, Neck: supple, no JVD Chest: CTA bilaterally, no wheezes, crackles, or other adventitious sounds CV: RRR, no m/g/c/r Abd: soft, NT, ND, +BS in all four quadrants; no HSM, guarding, ridigity, or rebound tenderness Ext: no edema, well perfused with 2+ pulses, Skin: no rash or lesions noted on observed skin Lymph: no noted LAD  Data Reviewed:  Prior hospital notes and labs reviewed  01/14/20-BNP 713, creatinine 3.18, GFR 18, CBC with hemoglobin 11.8, normal MCV, uric acid 14.1.   Assessment/Plan: Ms. Trickey is a 79 y.o. adult    Trelyn was seen today for follow-up.  Diagnoses and all orders for this visit:  Gastric ulcer, unspecified chronicity, unspecified whether gastric ulcer hemorrhage or perforation present  Chronic GERD  Other orders -     pantoprazole (PROTONIX) 40 MG tablet; TAKE 1 TABLET BY MOUTH EVERY DAY BEFORE BREAKFAST     1. History of gastric ulcer-EGD last August as above.  We will have him continue Protonix 40 mg once a day.  Symptomatically he is doing well.  NSAID avoidance discussed.  He will watch for dark stools  now that he is on Plavix daily.  2.  Colon cancer screening-given his age and comorbidities will hold off on further screening.  Last colonoscopy was 2011.  Lower GI symptoms reviewed contacting the.  He denies any first-degree relatives with colon  polyps or colon cancer.  Follow-up 1 year-to notify me if any GI issues in the interim.   I personally performed the service, non-incident to. (WP)  Laurine Blazer, [facility]

## 2020-02-23 NOTE — Patient Instructions (Signed)
Continue protoinx 40mg  once a day - please call us w/ any questions or new symptoms prior to one year f/up.

## 2020-02-24 DIAGNOSIS — M79605 Pain in left leg: Secondary | ICD-10-CM | POA: Diagnosis not present

## 2020-02-24 DIAGNOSIS — M79604 Pain in right leg: Secondary | ICD-10-CM | POA: Diagnosis not present

## 2020-02-24 DIAGNOSIS — E1143 Type 2 diabetes mellitus with diabetic autonomic (poly)neuropathy: Secondary | ICD-10-CM | POA: Diagnosis not present

## 2020-03-04 DIAGNOSIS — Z299 Encounter for prophylactic measures, unspecified: Secondary | ICD-10-CM | POA: Diagnosis not present

## 2020-03-04 DIAGNOSIS — I10 Essential (primary) hypertension: Secondary | ICD-10-CM | POA: Diagnosis not present

## 2020-03-04 DIAGNOSIS — Z6833 Body mass index (BMI) 33.0-33.9, adult: Secondary | ICD-10-CM | POA: Diagnosis not present

## 2020-03-04 DIAGNOSIS — M549 Dorsalgia, unspecified: Secondary | ICD-10-CM | POA: Diagnosis not present

## 2020-03-04 DIAGNOSIS — E1142 Type 2 diabetes mellitus with diabetic polyneuropathy: Secondary | ICD-10-CM | POA: Diagnosis not present

## 2020-03-04 DIAGNOSIS — E1165 Type 2 diabetes mellitus with hyperglycemia: Secondary | ICD-10-CM | POA: Diagnosis not present

## 2020-03-10 ENCOUNTER — Ambulatory Visit (INDEPENDENT_AMBULATORY_CARE_PROVIDER_SITE_OTHER): Payer: Medicare Other | Admitting: *Deleted

## 2020-03-10 DIAGNOSIS — I495 Sick sinus syndrome: Secondary | ICD-10-CM | POA: Diagnosis not present

## 2020-03-10 LAB — CUP PACEART REMOTE DEVICE CHECK
Battery Impedance: 322 Ohm
Battery Remaining Longevity: 106 mo
Battery Voltage: 2.78 V
Brady Statistic AP VP Percent: 1 %
Brady Statistic AP VS Percent: 70 %
Brady Statistic AS VP Percent: 0 %
Brady Statistic AS VS Percent: 30 %
Date Time Interrogation Session: 20210811112721
Implantable Lead Implant Date: 20000419
Implantable Lead Implant Date: 20000419
Implantable Lead Location: 753859
Implantable Lead Location: 753860
Implantable Lead Model: 4023
Implantable Pulse Generator Implant Date: 20150831
Lead Channel Impedance Value: 398 Ohm
Lead Channel Impedance Value: 431 Ohm
Lead Channel Pacing Threshold Amplitude: 0.5 V
Lead Channel Pacing Threshold Amplitude: 0.75 V
Lead Channel Pacing Threshold Pulse Width: 0.4 ms
Lead Channel Pacing Threshold Pulse Width: 0.4 ms
Lead Channel Setting Pacing Amplitude: 2 V
Lead Channel Setting Pacing Amplitude: 2.5 V
Lead Channel Setting Pacing Pulse Width: 0.4 ms
Lead Channel Setting Sensing Sensitivity: 2.8 mV

## 2020-03-15 NOTE — Progress Notes (Signed)
Remote pacemaker transmission.   

## 2020-03-18 DIAGNOSIS — M4807 Spinal stenosis, lumbosacral region: Secondary | ICD-10-CM | POA: Diagnosis not present

## 2020-03-18 DIAGNOSIS — I700 Atherosclerosis of aorta: Secondary | ICD-10-CM | POA: Diagnosis not present

## 2020-03-18 DIAGNOSIS — M48061 Spinal stenosis, lumbar region without neurogenic claudication: Secondary | ICD-10-CM | POA: Diagnosis not present

## 2020-03-18 DIAGNOSIS — M545 Low back pain: Secondary | ICD-10-CM | POA: Diagnosis not present

## 2020-03-18 DIAGNOSIS — I714 Abdominal aortic aneurysm, without rupture: Secondary | ICD-10-CM | POA: Diagnosis not present

## 2020-03-22 DIAGNOSIS — Z299 Encounter for prophylactic measures, unspecified: Secondary | ICD-10-CM | POA: Diagnosis not present

## 2020-03-22 DIAGNOSIS — E1122 Type 2 diabetes mellitus with diabetic chronic kidney disease: Secondary | ICD-10-CM | POA: Diagnosis not present

## 2020-03-22 DIAGNOSIS — M4800 Spinal stenosis, site unspecified: Secondary | ICD-10-CM | POA: Diagnosis not present

## 2020-03-22 DIAGNOSIS — E1142 Type 2 diabetes mellitus with diabetic polyneuropathy: Secondary | ICD-10-CM | POA: Diagnosis not present

## 2020-03-22 DIAGNOSIS — E1165 Type 2 diabetes mellitus with hyperglycemia: Secondary | ICD-10-CM | POA: Diagnosis not present

## 2020-03-22 DIAGNOSIS — I10 Essential (primary) hypertension: Secondary | ICD-10-CM | POA: Diagnosis not present

## 2020-03-30 ENCOUNTER — Other Ambulatory Visit: Payer: Self-pay

## 2020-03-30 ENCOUNTER — Telehealth: Payer: Self-pay

## 2020-03-30 ENCOUNTER — Ambulatory Visit (INDEPENDENT_AMBULATORY_CARE_PROVIDER_SITE_OTHER): Payer: Medicare Other

## 2020-03-30 DIAGNOSIS — R310 Gross hematuria: Secondary | ICD-10-CM

## 2020-03-30 DIAGNOSIS — C61 Malignant neoplasm of prostate: Secondary | ICD-10-CM

## 2020-03-30 LAB — BLADDER SCAN AMB NON-IMAGING: Scan Result: 55.3

## 2020-03-30 NOTE — Progress Notes (Signed)
Pt complained of difficulty voiding with hematuria. Pt came into office for PVR and urine culture. Pt stated that on the way to office he voided in urinal. PVR noted @ 97.4. Pt was able to void again and PVR noted @ 55.3. Urine was amber in color. Urine sent for culture. Pt denied any pain or discomfort.

## 2020-03-30 NOTE — Telephone Encounter (Signed)
Pt called this am with difficulty voiding and hematuria  NV scheduled for this afternoon for PVR and urine culture.

## 2020-03-31 ENCOUNTER — Other Ambulatory Visit: Payer: Self-pay | Admitting: Neurosurgery

## 2020-03-31 DIAGNOSIS — Z981 Arthrodesis status: Secondary | ICD-10-CM | POA: Diagnosis not present

## 2020-04-01 LAB — URINE CULTURE: Organism ID, Bacteria: NO GROWTH

## 2020-04-01 NOTE — Progress Notes (Signed)
Culture negative. He needs to to have a CT abd/pelvis w/wo contrast

## 2020-04-06 ENCOUNTER — Telehealth (HOSPITAL_COMMUNITY): Payer: Self-pay | Admitting: Cardiology

## 2020-04-06 NOTE — Telephone Encounter (Signed)
Patient left voicemail on triage line requesting return call  Attempted to return call x 3 (unsuccessful) unable to leave message as patient would answer phone and then hang up

## 2020-04-07 DIAGNOSIS — I700 Atherosclerosis of aorta: Secondary | ICD-10-CM | POA: Diagnosis not present

## 2020-04-07 DIAGNOSIS — S22079A Unspecified fracture of T9-T10 vertebra, initial encounter for closed fracture: Secondary | ICD-10-CM | POA: Diagnosis not present

## 2020-04-07 DIAGNOSIS — I517 Cardiomegaly: Secondary | ICD-10-CM | POA: Diagnosis not present

## 2020-04-07 DIAGNOSIS — Z981 Arthrodesis status: Secondary | ICD-10-CM | POA: Diagnosis not present

## 2020-04-07 DIAGNOSIS — M2469 Ankylosis, other specified joint: Secondary | ICD-10-CM | POA: Diagnosis not present

## 2020-04-07 DIAGNOSIS — M8448XA Pathological fracture, other site, initial encounter for fracture: Secondary | ICD-10-CM | POA: Diagnosis not present

## 2020-04-07 DIAGNOSIS — M5184 Other intervertebral disc disorders, thoracic region: Secondary | ICD-10-CM | POA: Diagnosis not present

## 2020-04-08 ENCOUNTER — Telehealth (HOSPITAL_COMMUNITY): Payer: Self-pay

## 2020-04-08 NOTE — Telephone Encounter (Signed)
Discussed with Dr Haroldine Laws. Ok to stop plavix.   Please call.   Yena Tisby  NP-C  4:04 PM

## 2020-04-08 NOTE — Telephone Encounter (Signed)
Patient called to report that over last week and the weekend he had been experiencing blood in his urine and stool, last Tuesday he was unable to urinate for 6 hours. Patient was seen by urology and urine culture came back negative. Patient reports that he did some research online and saw that it was a possible side effect of Plavix. patient stopped taking his Plavix on Saturday(04/03/20).   Please advise.

## 2020-04-09 NOTE — Telephone Encounter (Signed)
Patient advised and verbalized understanding 

## 2020-04-14 ENCOUNTER — Other Ambulatory Visit: Payer: Medicare Other

## 2020-04-15 ENCOUNTER — Other Ambulatory Visit: Payer: Self-pay

## 2020-04-15 ENCOUNTER — Ambulatory Visit (HOSPITAL_COMMUNITY)
Admission: RE | Admit: 2020-04-15 | Discharge: 2020-04-15 | Disposition: A | Discharge status: Home / Self Care | Payer: Medicare Other | Source: Ambulatory Visit | Attending: Internal Medicine | Admitting: Internal Medicine

## 2020-04-15 VITALS — BP 130/75 | HR 85 | Wt 196.1 lb

## 2020-04-15 DIAGNOSIS — I130 Hypertensive heart and chronic kidney disease with heart failure and stage 1 through stage 4 chronic kidney disease, or unspecified chronic kidney disease: Secondary | ICD-10-CM | POA: Diagnosis not present

## 2020-04-15 DIAGNOSIS — G4733 Obstructive sleep apnea (adult) (pediatric): Secondary | ICD-10-CM | POA: Diagnosis not present

## 2020-04-15 DIAGNOSIS — Z8546 Personal history of malignant neoplasm of prostate: Secondary | ICD-10-CM | POA: Diagnosis not present

## 2020-04-15 DIAGNOSIS — Z6830 Body mass index (BMI) 30.0-30.9, adult: Secondary | ICD-10-CM | POA: Insufficient documentation

## 2020-04-15 DIAGNOSIS — I2510 Atherosclerotic heart disease of native coronary artery without angina pectoris: Secondary | ICD-10-CM | POA: Diagnosis not present

## 2020-04-15 DIAGNOSIS — E785 Hyperlipidemia, unspecified: Secondary | ICD-10-CM | POA: Diagnosis not present

## 2020-04-15 DIAGNOSIS — Z7982 Long term (current) use of aspirin: Secondary | ICD-10-CM | POA: Insufficient documentation

## 2020-04-15 DIAGNOSIS — Z87891 Personal history of nicotine dependence: Secondary | ICD-10-CM | POA: Diagnosis not present

## 2020-04-15 DIAGNOSIS — I5022 Chronic systolic (congestive) heart failure: Secondary | ICD-10-CM | POA: Diagnosis not present

## 2020-04-15 DIAGNOSIS — Z7984 Long term (current) use of oral hypoglycemic drugs: Secondary | ICD-10-CM | POA: Diagnosis not present

## 2020-04-15 DIAGNOSIS — Z951 Presence of aortocoronary bypass graft: Secondary | ICD-10-CM | POA: Insufficient documentation

## 2020-04-15 DIAGNOSIS — E669 Obesity, unspecified: Secondary | ICD-10-CM | POA: Insufficient documentation

## 2020-04-15 DIAGNOSIS — Z950 Presence of cardiac pacemaker: Secondary | ICD-10-CM | POA: Diagnosis not present

## 2020-04-15 DIAGNOSIS — Z8249 Family history of ischemic heart disease and other diseases of the circulatory system: Secondary | ICD-10-CM | POA: Insufficient documentation

## 2020-04-15 DIAGNOSIS — M1990 Unspecified osteoarthritis, unspecified site: Secondary | ICD-10-CM | POA: Insufficient documentation

## 2020-04-15 DIAGNOSIS — N184 Chronic kidney disease, stage 4 (severe): Secondary | ICD-10-CM | POA: Diagnosis not present

## 2020-04-15 DIAGNOSIS — E1122 Type 2 diabetes mellitus with diabetic chronic kidney disease: Secondary | ICD-10-CM | POA: Diagnosis not present

## 2020-04-15 DIAGNOSIS — J449 Chronic obstructive pulmonary disease, unspecified: Secondary | ICD-10-CM | POA: Insufficient documentation

## 2020-04-15 DIAGNOSIS — Z79899 Other long term (current) drug therapy: Secondary | ICD-10-CM | POA: Insufficient documentation

## 2020-04-15 LAB — BASIC METABOLIC PANEL
Anion gap: 10 (ref 5–15)
BUN: 38 mg/dL — ABNORMAL HIGH (ref 8–23)
CO2: 25 mmol/L (ref 22–32)
Calcium: 9.3 mg/dL (ref 8.9–10.3)
Chloride: 104 mmol/L (ref 98–111)
Creatinine, Ser: 2.21 mg/dL — ABNORMAL HIGH (ref 0.61–1.24)
GFR calc Af Amer: 32 mL/min — ABNORMAL LOW (ref >60)
GFR calc non Af Amer: 27 mL/min — ABNORMAL LOW (ref >60)
Glucose, Bld: 133 mg/dL — ABNORMAL HIGH (ref 70–99)
Potassium: 3.8 mmol/L (ref 3.5–5.1)
Sodium: 139 mmol/L (ref 135–145)

## 2020-04-15 LAB — CBC
HCT: 36.4 % — ABNORMAL LOW (ref 39.0–52.0)
Hemoglobin: 11.2 g/dL — ABNORMAL LOW (ref 13.0–17.0)
MCH: 29.2 pg (ref 26.0–34.0)
MCHC: 30.8 g/dL (ref 30.0–36.0)
MCV: 95 fL (ref 80.0–100.0)
Platelets: 226 10*3/uL (ref 150–400)
RBC: 3.83 MIL/uL — ABNORMAL LOW (ref 4.22–5.81)
RDW: 14.7 % (ref 11.5–15.5)
WBC: 6 10*3/uL (ref 4.0–10.5)
nRBC: 0 % (ref 0.0–0.2)

## 2020-04-15 LAB — BRAIN NATRIURETIC PEPTIDE: B Natriuretic Peptide: 674.8 pg/mL — ABNORMAL HIGH (ref 0.0–100.0)

## 2020-04-15 NOTE — Patient Instructions (Signed)
INCREASE Lasix 80mg  Daily for 2 days   THEN GO BACK TO 40MG  DAILY AFTER   Please wear your compression hose daily, place them on as soon as you get up in the morning and remove before you go to bed at night.  Labs done today, your results will be available in MyChart, we will contact you for abnormal readings.  You have been referred to [facility]. They will contact you to schedule an appointment  Please call our office in January to schedule your follow up appointment and echocardiogram  Your physician has requested that you have an echocardiogram. Echocardiography is a painless test that uses sound waves to create images of your heart. It provides your doctor with information about the size and shape of your heart and how well your heart's chambers and valves are working. This procedure takes approximately one hour. There are no restrictions for this procedure.   If you have any questions or concerns before your next appointment please send [place] a message through [place] or call our office at [phone].    TO LEAVE A MESSAGE FOR THE NURSE SELECT OPTION 2, PLEASE LEAVE A MESSAGE INCLUDING: . YOUR NAME . DATE OF BIRTH . CALL BACK NUMBER . REASON FOR CALL**this is important as we prioritize the call backs  [place] AS LONG AS YOU CALL BEFORE 4:00 PM  At the [facility], you and your health needs are our priority. As part of our continuing mission to provide you with exceptional heart care, we have created designated Provider Care Teams. These Care Teams include your primary Cardiologist (physician) and Advanced Practice Providers (APPs- Physician Assistants and Nurse Practitioners) who all work together to provide you with the care you need, when you need it.   You may see any of the following providers on your designated Care Team at your next follow up: [name] Dr Glori Bickers . Dr Loralie Champagne . Darrick Grinder, NP . Lyda Jester, PA . Audry Riles, PharmD   Please be sure to bring in all your medications bottles to every appointment.

## 2020-04-15 NOTE — Progress Notes (Signed)
Advanced Heart Failure Clinic Note   Referring Physician: PCP: Monico Blitz, MD PCP-Cardiologist: Thompson Grayer, MD  AHF: Dr. Haroldine Laws   HPI:  Mr. Sek a 79 y.o.male, retired Water engineer CAD (s/p CABG in 2000), symptomatic bradycardia (s/p PPM placement), HTN, HLD, Type 2 DM,OSA prostate cancer and systolic heart failure.   He initially presented to [facility] ED on 11/22/2019 for evaluation of fatigue and tarry stools. He reported worsening dyspnea + LE edema and exertional fatigue for the past month. NYHA IIIb-IV.No chest pain. He initially thought his symptoms might be secondary to a GI ulcer as he reports similar symptoms when he had significant anemia in the past.Not on a diuretic at home and reported his family doctor told him he had an"elevated creatinine" in the past.   Echo 4/21 with newly reduced EF 30-35% and moderate RV dysfunction. Previous echo 2013 EF 50-55%. Myoview 11/18 EF 52% with fixed inferolateral defect. His device was interrogated and did not show high burden RV pacing rate.. Given elevated SCr, NST was opted for initial ischemic testing instead of LHC. NST showed large defect in the anterolateral (base,mid, distal), inferolateral (base, mid, distal)and inferior (base, mid) walls that improves minimally in inferior mid region consistent with large area of scar with minimal periinfarct ischemia. Small defect in the anterior mid region that improves consistent with a small region of mild ischemia. EF 28%.  It was suspected he likely had occluded his SVG- OM but, given small area of ischemia, risk of LHC outweighs benefit. Decision was made to treat medically.  At last visit BP was low. Lasix cut back. Hydral/imdur stopped. Arlyce Harman stopped as well. Recently had hematuria and Plavix stopped. Last creatinine in 6/21 was 3.18 (GFR 18)  Here for today for f/u. Says he is doing ok. Gets around pretty good with his walker. No CP. Mild LE edema. Recently had worsening  back and has seen Dr. Annette Stable. Low BPs have resolved. Takes BP every am 110/70s. Taking lasix 40 daily. Will take extra lasix occasionally if swelling up. Weight stable. Mild DOE on exertion. Can go to mailbox and go to store. Goes slowly.    Past Medical History:  Diagnosis Date  . AAA (abdominal aortic aneurysm) ([place])   . COPD (chronic obstructive pulmonary disease) ([place])   . Coronary artery disease 2000   Status post CABG  . Diabetes mellitus    DM2  . Dyslipidemia   . Dyspnea   . History of kidney stones   . Hypertension   . Obesity   . Osteoarthritis   . Pacemaker   . Prostate cancer ([place])   . Sleep apnea    USING CPAP  . Spinal stenosis   . SSS (sick sinus syndrome) ([place]) 2000   s/p PPM    Current Outpatient Medications  Medication Sig Dispense Refill  . acetaminophen (TYLENOL) 500 MG tablet Take 1,000 mg by mouth every 6 (six) hours as needed for moderate pain or headache.     [name] aspirin EC 81 MG tablet Take 1 tablet (81 mg total) by mouth daily. Restart in 2 weeks    . atorvastatin (LIPITOR) 40 MG tablet Take 40 mg by mouth at bedtime.     . furosemide (LASIX) 40 MG tablet Take 1 tablet (40 mg total) by mouth daily. 90 tablet 3  . glimepiride (AMARYL) 2 MG tablet Take 2 mg by mouth daily with breakfast.    . metoprolol succinate (TOPROL-XL) 50 MG 24 hr tablet Take 1 tablet (50  mg total) by mouth daily. Take with or immediately following a meal. 30 tablet 5  . nitroGLYCERIN (NITROSTAT) 0.4 MG SL tablet Place 0.4 mg under the tongue every 5 (five) minutes as needed for chest pain.     . pantoprazole (PROTONIX) 40 MG tablet TAKE 1 TABLET BY MOUTH EVERY DAY BEFORE BREAKFAST 90 tablet 3  . tamsulosin (FLOMAX) 0.4 MG CAPS capsule Take 0.4 mg by mouth in the morning.     [name] alfuzosin (UROXATRAL) 10 MG 24 hr tablet TAKE 1 TABLET BY MOUTH EVERY DAY (Patient not taking: Reported on 01/14/2020) 90 tablet 3   No current facility-administered medications for this encounter.    No Known  Allergies    Social History   Socioeconomic History  . Marital status: Married    Spouse name: Dot  . Number of children: 2  . Years of education: Not on file  . Highest education level: Not on file  Occupational History  . Occupation: Marine scientist: OTHER    Comment: [employer]   Tobacco Use  . Smoking status: Former Smoker    Packs/day: 1.00    Years: 42.00    Pack years: 42.00    Types: Cigarettes    Start date: 01/28/1961    Quit date: 10/30/1998    Years since quitting: 21.4  . Smokeless tobacco: Never Used  Vaping Use  . Vaping Use: Never used  Substance and Sexual Activity  . Alcohol use: No    Alcohol/week: 0.0 standard drinks  . Drug use: No  . Sexual activity: Not Currently  Other Topics Concern  . Not on file  Social History Narrative   Lives with wife in a 2 story home.  They have 2 grown children.     Retired Software engineer.     Investment banker, operational of Radio broadcast assistant Strain:   . Difficulty of Paying Living Expenses: Not on file  Food Insecurity:   . Worried About Charity fundraiser in the Last Year: Not on file  . Ran Out of Food in the Last Year: Not on file  Transportation Needs:   . Lack of Transportation (Medical): Not on file  . Lack of Transportation (Non-Medical): Not on file  Physical Activity:   . Days of Exercise per Week: Not on file  . Minutes of Exercise per Session: Not on file  Stress:   . Feeling of Stress : Not on file  Social Connections:   . Frequency of Communication with Friends and Family: Not on file  . Frequency of Social Gatherings with Friends and Family: Not on file  . Attends Religious Services: Not on file  . Active Member of Clubs or Organizations: Not on file  . Attends Archivist Meetings: Not on file  . Marital Status: Not on file  Intimate Partner Violence:   . Fear of Current or Ex-Partner: Not on file  . Emotionally Abused: Not on file  . Physically Abused: Not on file  . Sexually  Abused: Not on file      Family History  Problem Relation Age of Onset  . CAD Father   . Stroke Father        Deceased, 53  . Hypertension Mother   . Stroke Mother        Deceased, 40  . Heart disease Mother   . Stroke Brother        Deceased, 38  . Hypertension Sister  Deceased, 30  . Heart disease Sister   . Healthy Son   . Breast cancer Daughter   . Prostate cancer Neg Hx   . Colon cancer Neg Hx   . Pancreatic cancer Neg Hx     Vitals:   04/15/20 1355  BP: 130/75  Pulse: 85  SpO2: 94%  Weight: 89 kg (196 lb 2 oz)     PHYSICAL EXAM: General:  Elderly male sitting in his walker.  No resp difficulty HEENT: normal Neck: supple. JVP 7. Carotids 2+ bilat; no bruits. No lymphadenopathy or thryomegaly appreciated. Cor: PMI nondisplaced. Regular rate & rhythm. No rubs, gallops or murmurs. Lungs: clear Abdomen: soft, nontender, nondistended. No hepatosplenomegaly. No bruits or masses. Good bowel sounds. Extremities: no cyanosis, clubbing, rash, 2+ R 1+L edema Neuro: alert & orientedx3, cranial nerves grossly intact. moves all 4 extremities w/o difficulty. Affect pleasant   ASSESSMENT & PLAN:  1. Chronic Systolic Heart Failure  - Echo 11/25/19 with LVEF at 30-35% and global hypokinesis(previous EF 52% in 2018), - RHC 4/30 w/ preserved cardiac output.   - no high burden RV pacing on device interrogation  - Cath deferred due to CKD in 4/21. Myoview 4/21 showed large defect in the anterolateral (base,mid, distal), inferolateral (base, mid, distal)and inferior (base, mid) walls that improves minimally in inferior mid region consistent with large area of scar with minimal periinfarct ischemia. EF 28%. Suspect he likely has occluded his SVG- OM.  - Stable NYHA II-early III - Volume status looks good.  - Continue Spiro 25 mg daily   - No ARB/ARNI due to CKD  - Off hydralazine/imdur due to low BP - Continue Toprol XL 50 mg daily.  - No SGLT2i due to GFR < 20 - Will  repeat echo. May need to consdier device upgrade to ICD if EF remains < 35%.   2. CAD - [place] CAD and is s/p CABG in 2000(LIMA to the LAD, saphenous vein graft to the OM1 and OM2 and posterior descending).   -  NSTEMI 4/21 and drop in EF, ideally would opt for coronary/graft angiography. However due to elevated SCr, he had Myoview which shoed large scar and minimal ischemia - No current s/s angina - Can reconsider cath if he develops refractory angaina   - Continue ASA and statin. Off plavix due to hematuria   4. CKD IV - SCr baseline now ~2.5-3.0. last creatinine 3.18 in 01/14/20 - refer to [facility] - recheck today  5. H/o Bradycardia - s/p PPM, followed by Dr. Rayann Heman   6. T2DM - followed by PCP - No SGLT2i with GFR < 20    Glori Bickers, MD 04/15/20

## 2020-04-29 ENCOUNTER — Telehealth (HOSPITAL_COMMUNITY): Payer: Self-pay | Admitting: *Deleted

## 2020-04-29 NOTE — Telephone Encounter (Signed)
Referral faxed to [place]. (2nd fax)

## 2020-04-30 ENCOUNTER — Ambulatory Visit (INDEPENDENT_AMBULATORY_CARE_PROVIDER_SITE_OTHER): Payer: Medicare Other | Admitting: Internal Medicine

## 2020-04-30 ENCOUNTER — Encounter: Payer: Self-pay | Admitting: Internal Medicine

## 2020-04-30 VITALS — BP 108/60 | HR 83 | Ht 67.0 in | Wt 200.4 lb

## 2020-04-30 DIAGNOSIS — I2510 Atherosclerotic heart disease of native coronary artery without angina pectoris: Secondary | ICD-10-CM | POA: Diagnosis not present

## 2020-04-30 DIAGNOSIS — N184 Chronic kidney disease, stage 4 (severe): Secondary | ICD-10-CM | POA: Diagnosis not present

## 2020-04-30 DIAGNOSIS — I495 Sick sinus syndrome: Secondary | ICD-10-CM | POA: Diagnosis not present

## 2020-04-30 DIAGNOSIS — I10 Essential (primary) hypertension: Secondary | ICD-10-CM

## 2020-04-30 DIAGNOSIS — I519 Heart disease, unspecified: Secondary | ICD-10-CM

## 2020-04-30 LAB — CUP PACEART INCLINIC DEVICE CHECK
Battery Impedance: 348 Ohm
Battery Remaining Longevity: 103 mo
Battery Voltage: 2.78 V
Brady Statistic AP VP Percent: 0 %
Brady Statistic AP VS Percent: 64 %
Brady Statistic AS VP Percent: 0 %
Brady Statistic AS VS Percent: 35 %
Date Time Interrogation Session: 20211001091928
Implantable Lead Implant Date: 20000419
Implantable Lead Implant Date: 20000419
Implantable Lead Location: 753859
Implantable Lead Location: 753860
Implantable Lead Model: 4023
Implantable Pulse Generator Implant Date: 20150831
Lead Channel Impedance Value: 355 Ohm
Lead Channel Impedance Value: 410 Ohm
Lead Channel Pacing Threshold Amplitude: 0.375 V
Lead Channel Pacing Threshold Amplitude: 0.875 V
Lead Channel Pacing Threshold Pulse Width: 0.4 ms
Lead Channel Pacing Threshold Pulse Width: 0.4 ms
Lead Channel Setting Pacing Amplitude: 2 V
Lead Channel Setting Pacing Amplitude: 2.5 V
Lead Channel Setting Pacing Pulse Width: 0.4 ms
Lead Channel Setting Sensing Sensitivity: 2 mV

## 2020-04-30 NOTE — Progress Notes (Signed)
PCP: Monico Blitz, MD Primary Cardiologist: Dr Haroldine Laws Primary EP:  Dr Rayann Heman  Francoise Schaumann is a 79 y.o. adult who presents today for routine electrophysiology followup.  Since last being seen in our clinic, the patient reports doing reasonably well. He was hospitalized with GI bleed.  He has also developed congestive heart failure and has been evaluated by Dr Haroldine Laws.  He has been started on medical therapy. Currently NYHA Class II.  Walks with a cane.  He is primarily limited by back pain.  SOB is improved.  Today, he denies symptoms of palpitations, chest pain, dizziness, presyncope, or syncope.  The patient is otherwise without complaint today.   Past Medical History:  Diagnosis Date  . AAA (abdominal aortic aneurysm) ([place])   . COPD (chronic obstructive pulmonary disease) ([place])   . Coronary artery disease 2000   Status post CABG  . Diabetes mellitus    DM2  . Dyslipidemia   . Dyspnea   . History of kidney stones   . Hypertension   . Obesity   . Osteoarthritis   . Pacemaker   . Prostate cancer ([place])   . Sleep apnea    USING CPAP  . Spinal stenosis   . SSS (sick sinus syndrome) ([place]) 2000   s/p PPM   Past Surgical History:  Procedure Laterality Date  . BIOPSY PROSTATE  01/09/2018  . CATARACT EXTRACTION Left   . CORONARY ARTERY BYPASS GRAFT     2000  . ESOPHAGOGASTRODUODENOSCOPY N/A 10/24/2018   Procedure: ESOPHAGOGASTRODUODENOSCOPY (EGD);  Surgeon: Rogene Houston, MD;  [facility];  Service: Endoscopy;  Laterality: N/A;  . ESOPHAGOGASTRODUODENOSCOPY N/A 03/19/2019   Procedure: ESOPHAGOGASTRODUODENOSCOPY (EGD);  Surgeon: Rogene Houston, MD;  Location: [facility];  Service: Endoscopy;  Laterality: N/A;  255  . JOINT REPLACEMENT     L hip Dr. Wynelle Link 07-18-17  . L5-S1 Gill decompressive laminectomy    . PACEMAKER INSERTION     MDT implanted for sick sinus syndrome  . PERMANENT PACEMAKER GENERATOR CHANGE N/A 03/30/2014   MDT Adapta L generator  change by Dr Rayann Heman  . RADIOACTIVE SEED IMPLANT N/A 06/10/2018   Procedure: RADIOACTIVE SEED IMPLANT/BRACHYTHERAPY IMPLANT;  Surgeon: Cleon Gustin, MD;  Location: [facility];  Service: Urology;  Laterality: N/A;  . REPLACEMENT TOTAL KNEE BILATERAL     BIL  . RIGHT HEART CATH N/A 11/28/2019   Procedure: RIGHT HEART CATH;  Surgeon: Jolaine Artist, MD;  Location: [facility];  Service: Cardiovascular;  Laterality: N/A;  . Right total hip arthroplasty.    . SPACE OAR INSTILLATION N/A 06/10/2018   Procedure: SPACE OAR INSTILLATION;  Surgeon: Cleon Gustin, MD;  Location: [facility];  Service: Urology;  Laterality: N/A;  . TOTAL HIP ARTHROPLASTY Left 07/18/2017   Procedure: LEFT TOTAL HIP ARTHROPLASTY ANTERIOR APPROACH;  Surgeon: [name], MD;  Location: [facility];  Service: Orthopedics;  Laterality: Left;  . TRANSRECTAL ULTRASOUND  01/09/2018    ROS- all systems are reviewed and negative except as per HPI above  Current Outpatient Medications  Medication Sig Dispense Refill  . acetaminophen (TYLENOL) 500 MG tablet Take 1,000 mg by mouth every 6 (six) hours as needed for moderate pain or headache.     . alfuzosin (UROXATRAL) 10 MG 24 hr tablet TAKE 1 TABLET BY MOUTH EVERY DAY 90 tablet 3  . aspirin EC 81 MG tablet Take 1 tablet (81 mg total) by mouth daily. Restart in 2 weeks    .  atorvastatin (LIPITOR) 40 MG tablet Take 40 mg by mouth at bedtime.     . furosemide (LASIX) 40 MG tablet Take 1 tablet (40 mg total) by mouth daily. 90 tablet 3  . glimepiride (AMARYL) 2 MG tablet Take 2 mg by mouth daily with breakfast.    . metoprolol succinate (TOPROL-XL) 50 MG 24 hr tablet Take 1 tablet (50 mg total) by mouth daily. Take with or immediately following a meal. 30 tablet 5  . nitroGLYCERIN (NITROSTAT) 0.4 MG SL tablet Place 0.4 mg under the tongue every 5 (five) minutes as needed for chest pain.     . pantoprazole (PROTONIX) 40 MG tablet TAKE 1 TABLET BY MOUTH EVERY DAY BEFORE  BREAKFAST 90 tablet 3  . tamsulosin (FLOMAX) 0.4 MG CAPS capsule Take 0.4 mg by mouth in the morning.      No current facility-administered medications for this visit.    Physical Exam: Vitals:   04/30/20 0907  BP: 108/60  Pulse: 83  SpO2: 99%  Weight: 200 lb 6.4 oz (90.9 kg)  Height: 5\' 7"  (1.702 m)    GEN- The patient is elderly appearing, alert and oriented x 3 today.   Head- normocephalic, atraumatic Eyes-  Sclera clear, conjunctiva pink Ears- hearing intact Oropharynx- clear Lungs-  , normal work of breathing Chest- pacemaker pocket is well healed Heart- Regular rate and rhythm  GI- soft  Extremities- no clubbing, cyanosis, +1 edema MS- diffuse atrophy (age appropriate) Walks with a cane  Pacemaker interrogation- reviewed in detail today,  See PACEART report  ekg tracing from 11/2019 is reviewed,  Echo reviewed,  Dr Gillermina Hu notes are reviewed  Assessment and Plan:  1. Symptomatic sinus bradycardia  Normal pacemaker function See Pace Art report No changes today he is not device dependant today He has a unipolar RV lead with occasional noise.  He does not V pace.  This is stable.  2. HTN Stable No change required today  3. CAD S/p CABG No ischemic symptoms  4. Obesity Lifestyle modification is advised  5. AAA Followed by vascular  6. Nonischemic CM EF 30-35% by echo 11/25/19 Now followed by Dr Haroldine Laws for medicine optimization He has a narrow QRS complex and does not V pace.  He is not a candidate for CRT.  Given his advanced age, also not a candidate for ICD. Could consider Baroreceptive activation therapy once medicine optimized for CHF if still symptomatic.  7. Paroxysmal atrial fibrillation He has had several short AF events over the past year (longest 36 minutes).  These seem to correspond to episodes of poor health.  Given prior GI bleed, I would not start anticoagulation at this time.  If his AF episodes increase in duration then we will  reconsider options.  He was asymptomatic with episodes. chads2vasc score is at least 6.  Risks, benefits and potential toxicities for medications prescribed and/or refilled reviewed with patient today.   Continue remote monitoring I will see in a year Follow-up with CHF team as scheduled  Thompson Grayer MD, [facility] 04/30/2020 9:34 AM

## 2020-04-30 NOTE — Patient Instructions (Signed)
Medication Instructions:  Continue all current medications.  Labwork: none  Testing/Procedures: none  Follow-Up: 1 year   Any Other Special Instructions Will Be Listed Below (If Applicable).  If you need a refill on your cardiac medications before your next appointment, please call your pharmacy.  

## 2020-05-05 DIAGNOSIS — Z981 Arthrodesis status: Secondary | ICD-10-CM | POA: Diagnosis not present

## 2020-05-28 DIAGNOSIS — I509 Heart failure, unspecified: Secondary | ICD-10-CM | POA: Diagnosis not present

## 2020-05-28 DIAGNOSIS — C61 Malignant neoplasm of prostate: Secondary | ICD-10-CM | POA: Diagnosis not present

## 2020-05-28 DIAGNOSIS — N184 Chronic kidney disease, stage 4 (severe): Secondary | ICD-10-CM | POA: Diagnosis not present

## 2020-05-28 DIAGNOSIS — D631 Anemia in chronic kidney disease: Secondary | ICD-10-CM | POA: Diagnosis not present

## 2020-05-28 DIAGNOSIS — E1122 Type 2 diabetes mellitus with diabetic chronic kidney disease: Secondary | ICD-10-CM | POA: Diagnosis not present

## 2020-05-28 DIAGNOSIS — Z950 Presence of cardiac pacemaker: Secondary | ICD-10-CM | POA: Diagnosis not present

## 2020-05-28 DIAGNOSIS — I714 Abdominal aortic aneurysm, without rupture: Secondary | ICD-10-CM | POA: Diagnosis not present

## 2020-05-28 DIAGNOSIS — I129 Hypertensive chronic kidney disease with stage 1 through stage 4 chronic kidney disease, or unspecified chronic kidney disease: Secondary | ICD-10-CM | POA: Diagnosis not present

## 2020-06-01 DIAGNOSIS — Z23 Encounter for immunization: Secondary | ICD-10-CM | POA: Diagnosis not present

## 2020-06-01 DIAGNOSIS — Z299 Encounter for prophylactic measures, unspecified: Secondary | ICD-10-CM | POA: Diagnosis not present

## 2020-06-01 DIAGNOSIS — E1142 Type 2 diabetes mellitus with diabetic polyneuropathy: Secondary | ICD-10-CM | POA: Diagnosis not present

## 2020-06-01 DIAGNOSIS — I10 Essential (primary) hypertension: Secondary | ICD-10-CM | POA: Diagnosis not present

## 2020-06-01 DIAGNOSIS — E119 Type 2 diabetes mellitus without complications: Secondary | ICD-10-CM | POA: Diagnosis not present

## 2020-06-01 DIAGNOSIS — E1165 Type 2 diabetes mellitus with hyperglycemia: Secondary | ICD-10-CM | POA: Diagnosis not present

## 2020-06-01 DIAGNOSIS — Z6834 Body mass index (BMI) 34.0-34.9, adult: Secondary | ICD-10-CM | POA: Diagnosis not present

## 2020-06-09 ENCOUNTER — Ambulatory Visit (INDEPENDENT_AMBULATORY_CARE_PROVIDER_SITE_OTHER): Payer: Medicare Other

## 2020-06-09 DIAGNOSIS — I495 Sick sinus syndrome: Secondary | ICD-10-CM

## 2020-06-10 LAB — CUP PACEART REMOTE DEVICE CHECK
Battery Impedance: 395 Ohm
Battery Remaining Longevity: 103 mo
Battery Voltage: 2.78 V
Brady Statistic AP VP Percent: 0 %
Brady Statistic AP VS Percent: 44 %
Brady Statistic AS VP Percent: 0 %
Brady Statistic AS VS Percent: 56 %
Date Time Interrogation Session: 20211110124059
Implantable Lead Implant Date: 20000419
Implantable Lead Implant Date: 20000419
Implantable Lead Location: 753859
Implantable Lead Location: 753860
Implantable Lead Model: 4023
Implantable Pulse Generator Implant Date: 20150831
Lead Channel Impedance Value: 368 Ohm
Lead Channel Impedance Value: 470 Ohm
Lead Channel Pacing Threshold Amplitude: 0.375 V
Lead Channel Pacing Threshold Amplitude: 0.75 V
Lead Channel Pacing Threshold Pulse Width: 0.4 ms
Lead Channel Pacing Threshold Pulse Width: 0.4 ms
Lead Channel Setting Pacing Amplitude: 2 V
Lead Channel Setting Pacing Amplitude: 2.5 V
Lead Channel Setting Pacing Pulse Width: 0.4 ms
Lead Channel Setting Sensing Sensitivity: 2.8 mV

## 2020-06-11 NOTE — Progress Notes (Signed)
Remote pacemaker transmission.   

## 2020-07-06 DIAGNOSIS — Z23 Encounter for immunization: Secondary | ICD-10-CM | POA: Diagnosis not present

## 2020-07-08 IMAGING — CR DG CHEST 2V
2 series · 2 of 2 positions shown · non-contrast
Comparison: None.

CLINICAL DATA: Preop

EXAM:
CHEST - 2 VIEW

[w chest pa]
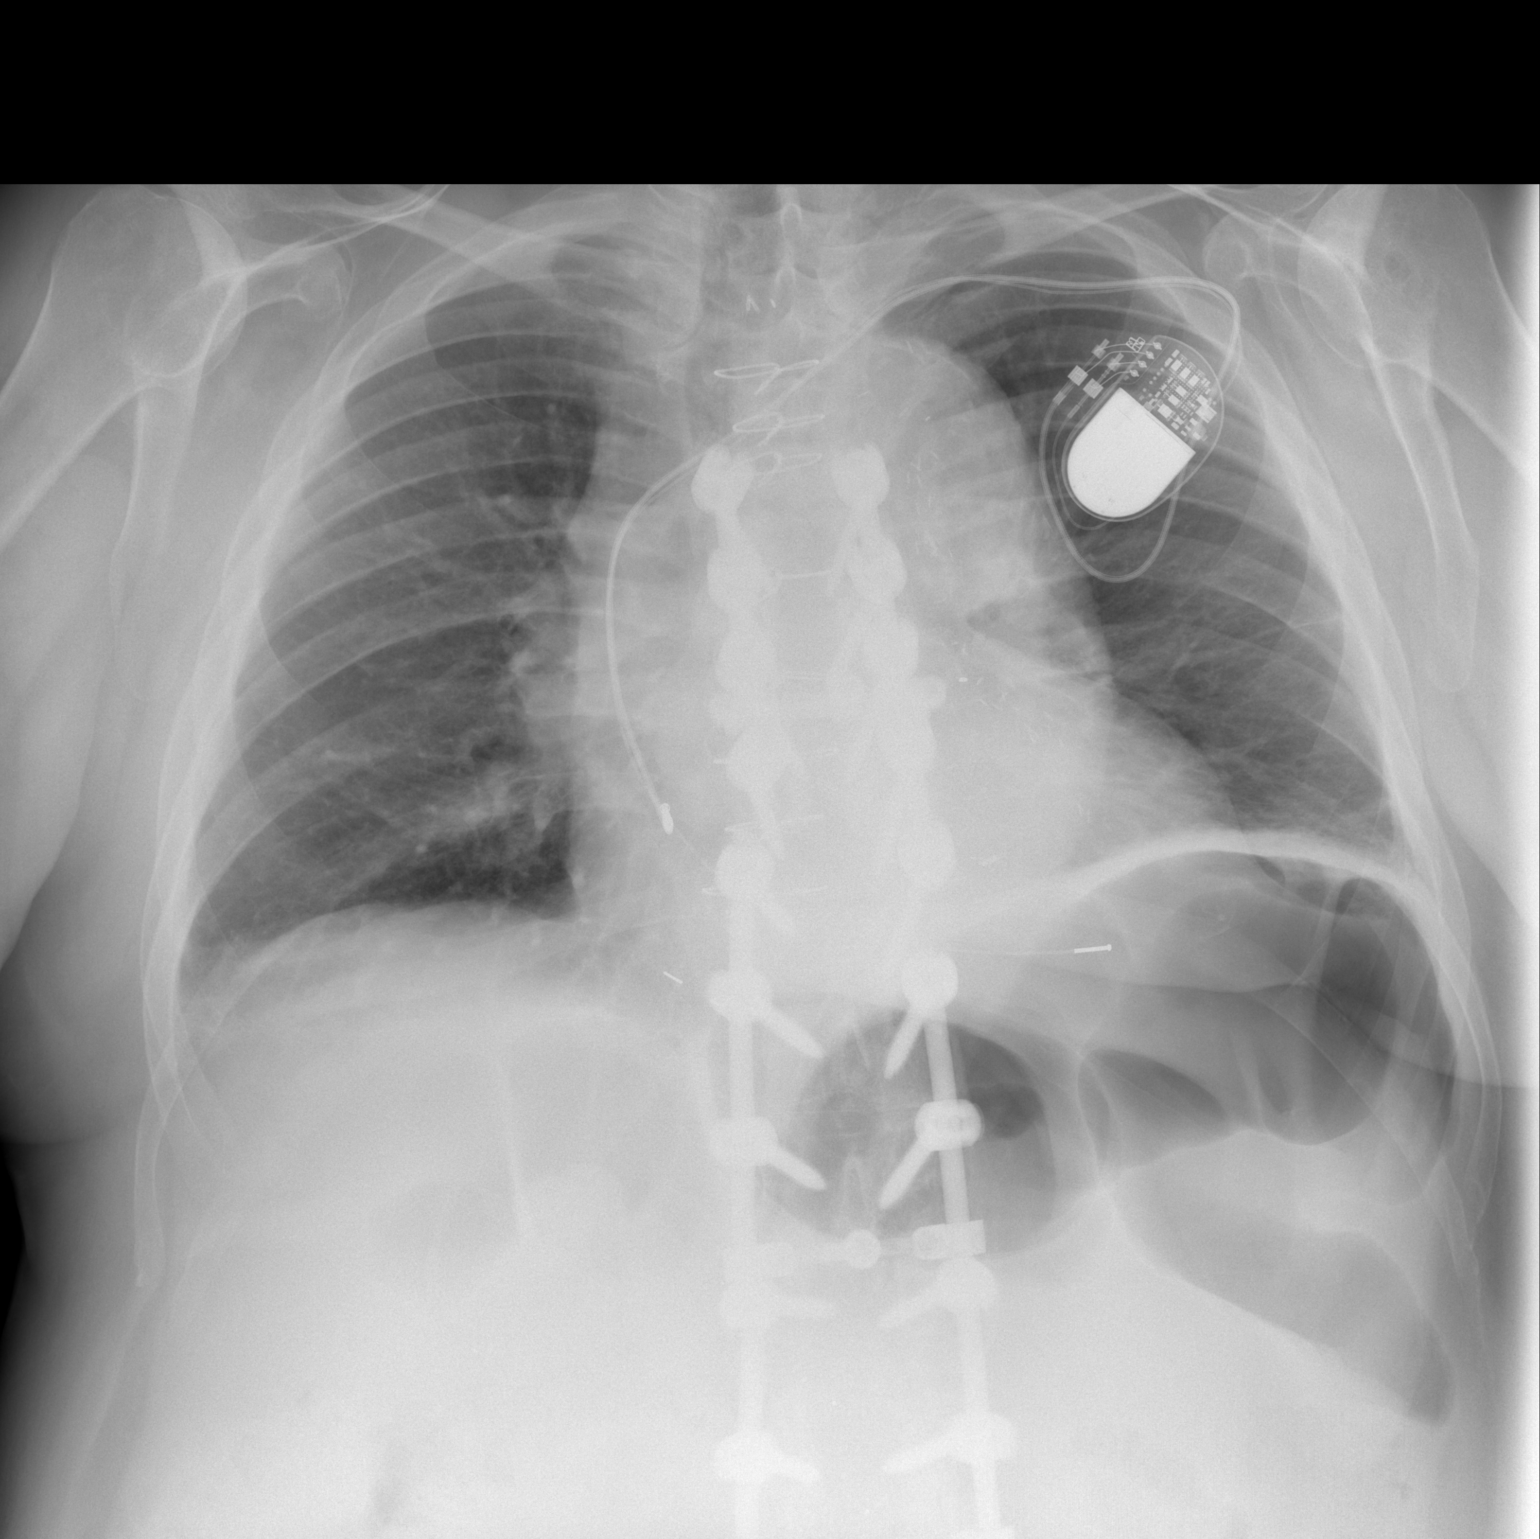

[w chest lat]
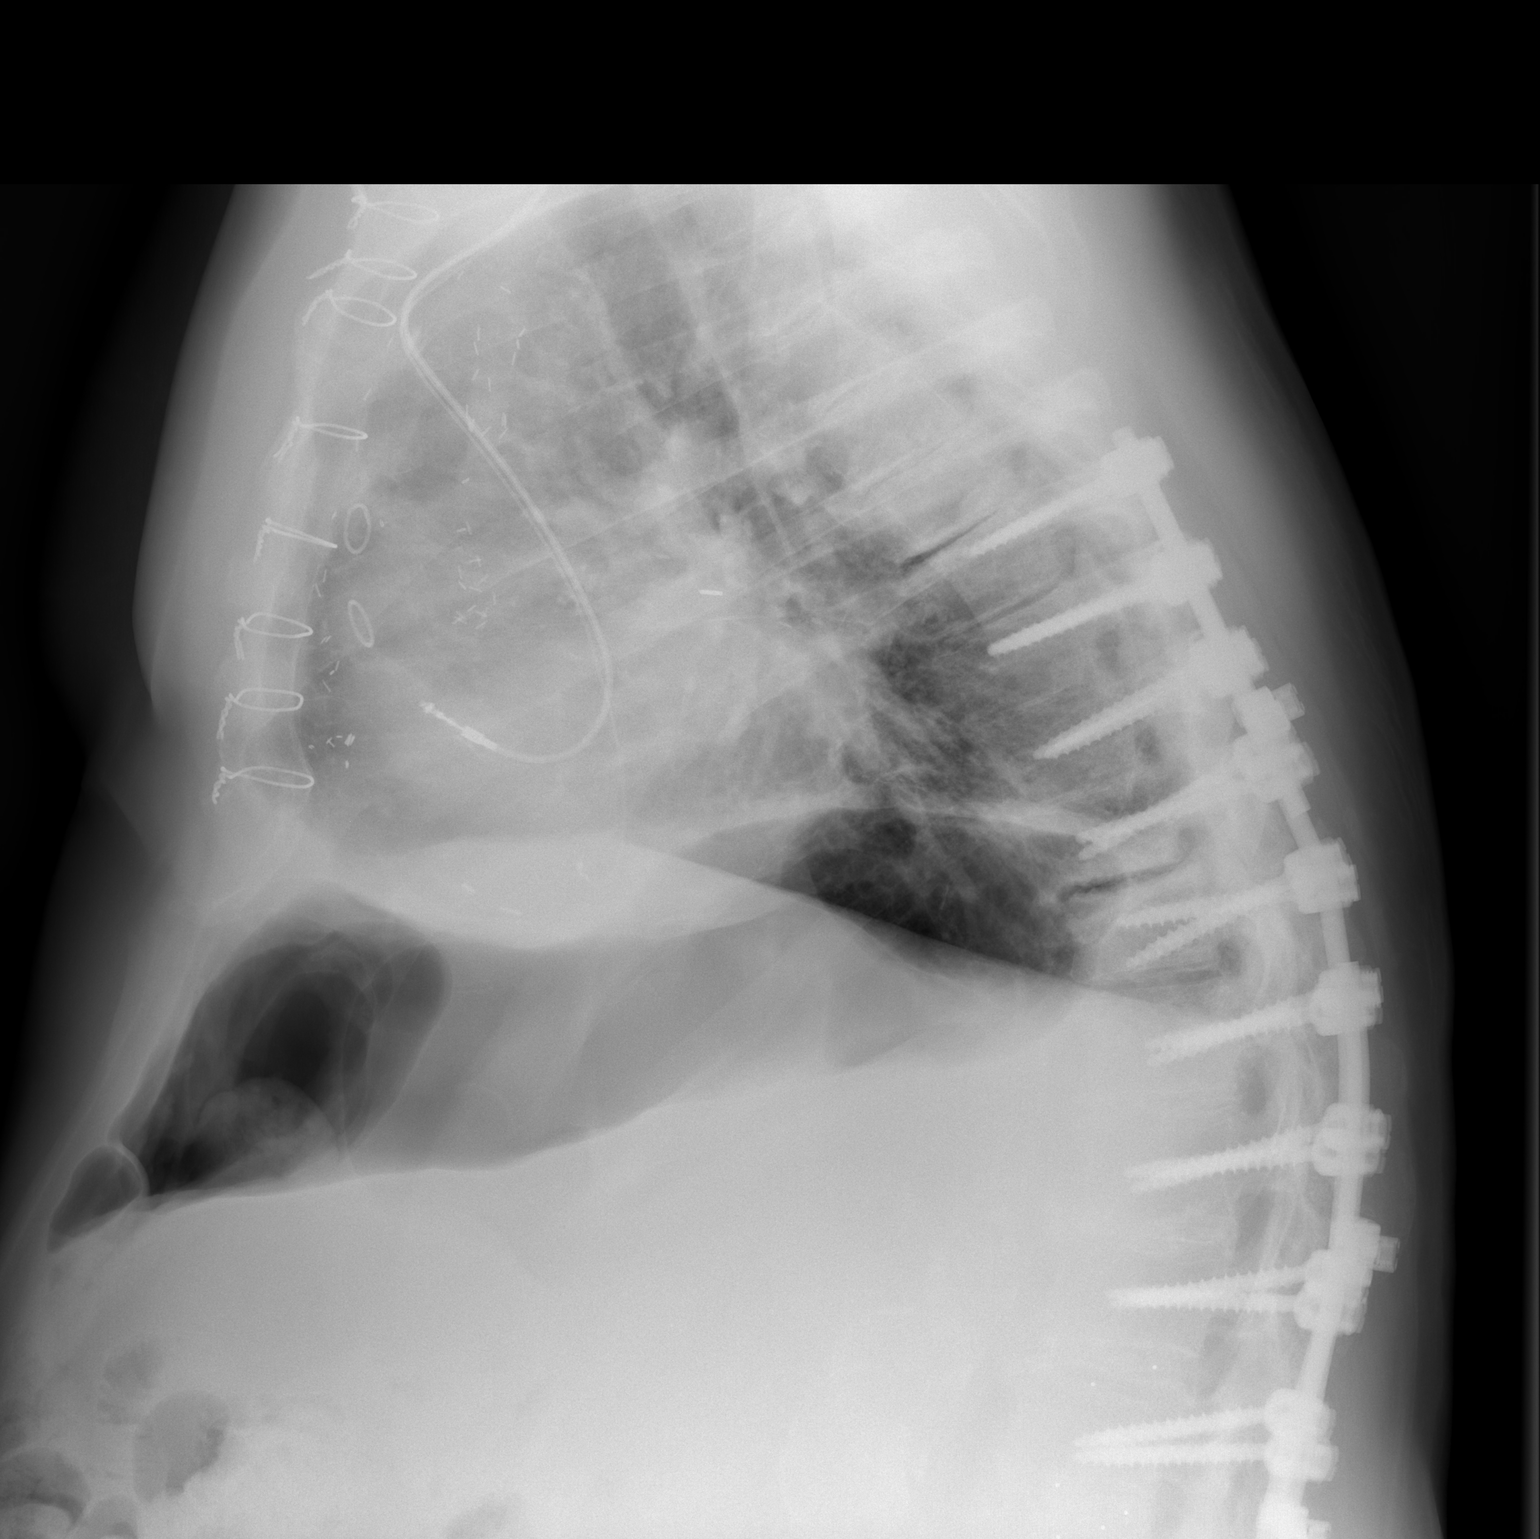

[2 of 2 positions shown; findings below may reference images not displayed]

FINDINGS: Left subclavian double lead pacemaker device is in place. The heart
is mildly enlarged. Aorta is tortuous. Low volumes. Bibasilar
atelectasis. Extensive hardware for thoracolumbar fusion. No
pneumothorax. Normal vascularity. The colon is distended with gas.
IMPRESSION: Bibasilar atelectasis and low lung volumes.

## 2020-07-14 DIAGNOSIS — Z6831 Body mass index (BMI) 31.0-31.9, adult: Secondary | ICD-10-CM | POA: Diagnosis not present

## 2020-07-14 DIAGNOSIS — Z981 Arthrodesis status: Secondary | ICD-10-CM | POA: Diagnosis not present

## 2020-07-14 DIAGNOSIS — M4326 Fusion of spine, lumbar region: Secondary | ICD-10-CM | POA: Diagnosis not present

## 2020-07-14 DIAGNOSIS — G894 Chronic pain syndrome: Secondary | ICD-10-CM | POA: Diagnosis not present

## 2020-07-14 DIAGNOSIS — I10 Essential (primary) hypertension: Secondary | ICD-10-CM | POA: Diagnosis not present

## 2020-07-28 DIAGNOSIS — E1165 Type 2 diabetes mellitus with hyperglycemia: Secondary | ICD-10-CM | POA: Diagnosis not present

## 2020-07-28 DIAGNOSIS — I10 Essential (primary) hypertension: Secondary | ICD-10-CM | POA: Diagnosis not present

## 2020-07-28 DIAGNOSIS — I5032 Chronic diastolic (congestive) heart failure: Secondary | ICD-10-CM | POA: Diagnosis not present

## 2020-07-28 DIAGNOSIS — Z6834 Body mass index (BMI) 34.0-34.9, adult: Secondary | ICD-10-CM | POA: Diagnosis not present

## 2020-07-28 DIAGNOSIS — Z299 Encounter for prophylactic measures, unspecified: Secondary | ICD-10-CM | POA: Diagnosis not present

## 2020-07-28 DIAGNOSIS — M109 Gout, unspecified: Secondary | ICD-10-CM | POA: Diagnosis not present

## 2020-09-06 DIAGNOSIS — G4700 Insomnia, unspecified: Secondary | ICD-10-CM | POA: Diagnosis not present

## 2020-09-06 DIAGNOSIS — R030 Elevated blood-pressure reading, without diagnosis of hypertension: Secondary | ICD-10-CM | POA: Diagnosis not present

## 2020-09-06 DIAGNOSIS — Z87891 Personal history of nicotine dependence: Secondary | ICD-10-CM | POA: Diagnosis not present

## 2020-09-06 DIAGNOSIS — E1165 Type 2 diabetes mellitus with hyperglycemia: Secondary | ICD-10-CM | POA: Diagnosis not present

## 2020-09-06 DIAGNOSIS — Z299 Encounter for prophylactic measures, unspecified: Secondary | ICD-10-CM | POA: Diagnosis not present

## 2020-09-06 DIAGNOSIS — I10 Essential (primary) hypertension: Secondary | ICD-10-CM | POA: Diagnosis not present

## 2020-09-06 DIAGNOSIS — Z6834 Body mass index (BMI) 34.0-34.9, adult: Secondary | ICD-10-CM | POA: Diagnosis not present

## 2020-09-06 DIAGNOSIS — E1129 Type 2 diabetes mellitus with other diabetic kidney complication: Secondary | ICD-10-CM | POA: Diagnosis not present

## 2020-09-06 DIAGNOSIS — M109 Gout, unspecified: Secondary | ICD-10-CM | POA: Diagnosis not present

## 2020-09-08 ENCOUNTER — Ambulatory Visit (INDEPENDENT_AMBULATORY_CARE_PROVIDER_SITE_OTHER): Payer: Medicare Other

## 2020-09-08 DIAGNOSIS — I495 Sick sinus syndrome: Secondary | ICD-10-CM

## 2020-09-10 LAB — CUP PACEART REMOTE DEVICE CHECK
Battery Impedance: 396 Ohm
Battery Remaining Longevity: 103 mo
Battery Voltage: 2.78 V
Brady Statistic AP VP Percent: 0 %
Brady Statistic AP VS Percent: 36 %
Brady Statistic AS VP Percent: 1 %
Brady Statistic AS VS Percent: 63 %
Date Time Interrogation Session: 20220210104037
Implantable Lead Implant Date: 20000419
Implantable Lead Implant Date: 20000419
Implantable Lead Location: 753859
Implantable Lead Location: 753860
Implantable Lead Model: 4023
Implantable Pulse Generator Implant Date: 20150831
Lead Channel Impedance Value: 355 Ohm
Lead Channel Impedance Value: 450 Ohm
Lead Channel Pacing Threshold Amplitude: 0.375 V
Lead Channel Pacing Threshold Amplitude: 0.875 V
Lead Channel Pacing Threshold Pulse Width: 0.4 ms
Lead Channel Pacing Threshold Pulse Width: 0.4 ms
Lead Channel Setting Pacing Amplitude: 2 V
Lead Channel Setting Pacing Amplitude: 2.5 V
Lead Channel Setting Pacing Pulse Width: 0.4 ms
Lead Channel Setting Sensing Sensitivity: 2.8 mV

## 2020-09-14 NOTE — Progress Notes (Signed)
Remote pacemaker transmission.   

## 2020-10-04 DIAGNOSIS — N184 Chronic kidney disease, stage 4 (severe): Secondary | ICD-10-CM | POA: Diagnosis not present

## 2020-10-04 DIAGNOSIS — Z6834 Body mass index (BMI) 34.0-34.9, adult: Secondary | ICD-10-CM | POA: Diagnosis not present

## 2020-10-04 DIAGNOSIS — I10 Essential (primary) hypertension: Secondary | ICD-10-CM | POA: Diagnosis not present

## 2020-10-04 DIAGNOSIS — E7800 Pure hypercholesterolemia, unspecified: Secondary | ICD-10-CM | POA: Diagnosis not present

## 2020-10-04 DIAGNOSIS — Z0000 Encounter for general adult medical examination without abnormal findings: Secondary | ICD-10-CM | POA: Diagnosis not present

## 2020-10-04 DIAGNOSIS — C61 Malignant neoplasm of prostate: Secondary | ICD-10-CM | POA: Diagnosis not present

## 2020-10-04 DIAGNOSIS — I714 Abdominal aortic aneurysm, without rupture: Secondary | ICD-10-CM | POA: Diagnosis not present

## 2020-10-04 DIAGNOSIS — E1122 Type 2 diabetes mellitus with diabetic chronic kidney disease: Secondary | ICD-10-CM | POA: Diagnosis not present

## 2020-10-04 DIAGNOSIS — Z125 Encounter for screening for malignant neoplasm of prostate: Secondary | ICD-10-CM | POA: Diagnosis not present

## 2020-10-04 DIAGNOSIS — Z1339 Encounter for screening examination for other mental health and behavioral disorders: Secondary | ICD-10-CM | POA: Diagnosis not present

## 2020-10-04 DIAGNOSIS — Z299 Encounter for prophylactic measures, unspecified: Secondary | ICD-10-CM | POA: Diagnosis not present

## 2020-10-04 DIAGNOSIS — D631 Anemia in chronic kidney disease: Secondary | ICD-10-CM | POA: Diagnosis not present

## 2020-10-04 DIAGNOSIS — I509 Heart failure, unspecified: Secondary | ICD-10-CM | POA: Diagnosis not present

## 2020-10-04 DIAGNOSIS — Z79899 Other long term (current) drug therapy: Secondary | ICD-10-CM | POA: Diagnosis not present

## 2020-10-04 DIAGNOSIS — R5383 Other fatigue: Secondary | ICD-10-CM | POA: Diagnosis not present

## 2020-10-04 DIAGNOSIS — Z7189 Other specified counseling: Secondary | ICD-10-CM | POA: Diagnosis not present

## 2020-10-04 DIAGNOSIS — Z1331 Encounter for screening for depression: Secondary | ICD-10-CM | POA: Diagnosis not present

## 2020-10-04 DIAGNOSIS — I129 Hypertensive chronic kidney disease with stage 1 through stage 4 chronic kidney disease, or unspecified chronic kidney disease: Secondary | ICD-10-CM | POA: Diagnosis not present

## 2020-10-04 DIAGNOSIS — M109 Gout, unspecified: Secondary | ICD-10-CM | POA: Diagnosis not present

## 2020-10-04 DIAGNOSIS — Z950 Presence of cardiac pacemaker: Secondary | ICD-10-CM | POA: Diagnosis not present

## 2020-12-07 DIAGNOSIS — I10 Essential (primary) hypertension: Secondary | ICD-10-CM | POA: Diagnosis not present

## 2020-12-07 DIAGNOSIS — R52 Pain, unspecified: Secondary | ICD-10-CM | POA: Diagnosis not present

## 2020-12-07 DIAGNOSIS — E1165 Type 2 diabetes mellitus with hyperglycemia: Secondary | ICD-10-CM | POA: Diagnosis not present

## 2020-12-07 DIAGNOSIS — D692 Other nonthrombocytopenic purpura: Secondary | ICD-10-CM | POA: Diagnosis not present

## 2020-12-07 DIAGNOSIS — Z299 Encounter for prophylactic measures, unspecified: Secondary | ICD-10-CM | POA: Diagnosis not present

## 2020-12-07 DIAGNOSIS — Z6834 Body mass index (BMI) 34.0-34.9, adult: Secondary | ICD-10-CM | POA: Diagnosis not present

## 2020-12-07 DIAGNOSIS — I5032 Chronic diastolic (congestive) heart failure: Secondary | ICD-10-CM | POA: Diagnosis not present

## 2020-12-08 ENCOUNTER — Ambulatory Visit (INDEPENDENT_AMBULATORY_CARE_PROVIDER_SITE_OTHER): Payer: Medicare Other

## 2020-12-08 DIAGNOSIS — I495 Sick sinus syndrome: Secondary | ICD-10-CM | POA: Diagnosis not present

## 2020-12-09 LAB — CUP PACEART REMOTE DEVICE CHECK
Battery Impedance: 469 Ohm
Battery Remaining Longevity: 94 mo
Battery Voltage: 2.78 V
Brady Statistic AP VP Percent: 0 %
Brady Statistic AP VS Percent: 45 %
Brady Statistic AS VP Percent: 1 %
Brady Statistic AS VS Percent: 54 %
Date Time Interrogation Session: 20220511103211
Implantable Lead Implant Date: 20000419
Implantable Lead Implant Date: 20000419
Implantable Lead Location: 753859
Implantable Lead Location: 753860
Implantable Lead Model: 4023
Implantable Pulse Generator Implant Date: 20150831
Lead Channel Impedance Value: 355 Ohm
Lead Channel Impedance Value: 454 Ohm
Lead Channel Pacing Threshold Amplitude: 0.375 V
Lead Channel Pacing Threshold Amplitude: 0.875 V
Lead Channel Pacing Threshold Pulse Width: 0.4 ms
Lead Channel Pacing Threshold Pulse Width: 0.4 ms
Lead Channel Setting Pacing Amplitude: 2 V
Lead Channel Setting Pacing Amplitude: 2.5 V
Lead Channel Setting Pacing Pulse Width: 0.4 ms
Lead Channel Setting Sensing Sensitivity: 2.8 mV

## 2020-12-30 NOTE — Progress Notes (Signed)
Remote pacemaker transmission.   

## 2021-01-14 ENCOUNTER — Other Ambulatory Visit (HOSPITAL_COMMUNITY): Payer: Self-pay | Admitting: Internal Medicine

## 2021-01-25 DIAGNOSIS — Z23 Encounter for immunization: Secondary | ICD-10-CM | POA: Diagnosis not present

## 2021-02-17 DIAGNOSIS — E1122 Type 2 diabetes mellitus with diabetic chronic kidney disease: Secondary | ICD-10-CM | POA: Diagnosis not present

## 2021-02-17 DIAGNOSIS — I714 Abdominal aortic aneurysm, without rupture: Secondary | ICD-10-CM | POA: Diagnosis not present

## 2021-02-17 DIAGNOSIS — I129 Hypertensive chronic kidney disease with stage 1 through stage 4 chronic kidney disease, or unspecified chronic kidney disease: Secondary | ICD-10-CM | POA: Diagnosis not present

## 2021-02-17 DIAGNOSIS — N184 Chronic kidney disease, stage 4 (severe): Secondary | ICD-10-CM | POA: Diagnosis not present

## 2021-02-17 DIAGNOSIS — D631 Anemia in chronic kidney disease: Secondary | ICD-10-CM | POA: Diagnosis not present

## 2021-02-17 DIAGNOSIS — I509 Heart failure, unspecified: Secondary | ICD-10-CM | POA: Diagnosis not present

## 2021-02-17 DIAGNOSIS — Z950 Presence of cardiac pacemaker: Secondary | ICD-10-CM | POA: Diagnosis not present

## 2021-02-17 DIAGNOSIS — N189 Chronic kidney disease, unspecified: Secondary | ICD-10-CM | POA: Diagnosis not present

## 2021-02-17 DIAGNOSIS — C61 Malignant neoplasm of prostate: Secondary | ICD-10-CM | POA: Diagnosis not present

## 2021-02-23 ENCOUNTER — Ambulatory Visit (INDEPENDENT_AMBULATORY_CARE_PROVIDER_SITE_OTHER): Payer: Medicare Other | Admitting: Gastroenterology

## 2021-02-24 ENCOUNTER — Ambulatory Visit (INDEPENDENT_AMBULATORY_CARE_PROVIDER_SITE_OTHER): Payer: Medicare Other | Admitting: Gastroenterology

## 2021-02-24 ENCOUNTER — Other Ambulatory Visit: Payer: Self-pay

## 2021-02-24 ENCOUNTER — Encounter (INDEPENDENT_AMBULATORY_CARE_PROVIDER_SITE_OTHER): Payer: Self-pay | Admitting: Gastroenterology

## 2021-02-24 VITALS — BP 117/75 | HR 101 | Temp 97.3°F | Ht 67.0 in | Wt 208.0 lb

## 2021-02-24 DIAGNOSIS — K219 Gastro-esophageal reflux disease without esophagitis: Secondary | ICD-10-CM | POA: Insufficient documentation

## 2021-02-24 DIAGNOSIS — K279 Peptic ulcer, site unspecified, unspecified as acute or chronic, without hemorrhage or perforation: Secondary | ICD-10-CM

## 2021-02-24 MED ORDER — PANTOPRAZOLE SODIUM 40 MG PO TBEC: DELAYED_RELEASE_TABLET | ORAL | 3 refills | Status: DC

## 2021-02-24 NOTE — Progress Notes (Signed)
Referring Provider: Monico Blitz, MD Primary Care Physician:  Monico Blitz, MD Primary GI Physician: Dr. Jenetta Downer  Chief Complaint  Patient presents with   Follow-up    Patient is here today for a yearly follow up. He has no current GI issues. Patient has one bm per day and has a good appetite.    HPI:   [name] is a 80 y.o. adult male with history of GERD, GI bleed, PUD, duodenitis, NSTEMI and heart failure who presents today for follow up. Recent diagnosis of kidney disease stage IV. Presenting today for follow up of GERD. Last seen in our office 02/24/20.   GERD:  pantoprazole 40 mg daily, doing well on this he denies any breakthrough reflux symptoms. Denies dysphagia or odynophagia.   Having BMs once per day, occasionally experiences some constipation which he takes miralax for with good result. Denies melena, BRBPR  PUD: history of upper GI bleed in March 2020 secondary to large bulbar ulcer. Reports he occasionally takes voltaren maybe twice per week. Patient counseled previously on trying to avoid NSAIDs as much as possible. Denies abdominal pain, melena, weakness, dizziness or lightheadedness, no early satiety. Patient counseled previously on avoiding NSAIDs due to hx of PUD.  Filed Weights   02/24/21 1022  Weight: 208 lb (94.3 kg)    Last Colonoscopy:07/14/10, fair prep, no polyps  Last Endoscopy: 03/19/19 Normal esophagus. - Z-line irregular. - A few gastric polyps. - Scar in the incisura. - Duodenitis with non-critical narrowing. - Normal second portion of the duodenum.  Recommendations:  No recommendations for further screening colonoscopy r/t advanced age, unless otherwise clinically indicated  Past Medical History:  Diagnosis Date   AAA (abdominal aortic aneurysm) ([place])    COPD (chronic obstructive pulmonary disease) (HCC)    Coronary artery disease 2000   Status post CABG   Diabetes mellitus    DM2   Dyslipidemia    Dyspnea    History of kidney  stones    Hypertension    Obesity    Osteoarthritis    Pacemaker    Prostate cancer ([place])    Sleep apnea    USING CPAP   Spinal stenosis    SSS (sick sinus syndrome) ([place]) 2000   s/p PPM    Past Surgical History:  Procedure Laterality Date   BIOPSY PROSTATE  01/09/2018   CATARACT EXTRACTION Left    CORONARY ARTERY BYPASS GRAFT     2000   ESOPHAGOGASTRODUODENOSCOPY N/A 10/24/2018   Procedure: ESOPHAGOGASTRODUODENOSCOPY (EGD);  Surgeon: Rogene Houston, MD;  [facility];  Service: Endoscopy;  Laterality: N/A;   ESOPHAGOGASTRODUODENOSCOPY N/A 03/19/2019   Procedure: ESOPHAGOGASTRODUODENOSCOPY (EGD);  Surgeon: Rogene Houston, MD;  [facility];  Service: Endoscopy;  Laterality: N/A;  60   JOINT REPLACEMENT     L hip Dr. Wynelle Link 07-18-17   L5-S1 Gordy Levan decompressive laminectomy     PACEMAKER INSERTION     MDT implanted for sick sinus syndrome   PERMANENT PACEMAKER GENERATOR CHANGE N/A 03/30/2014   MDT Adapta L generator change by Dr Rayann Heman   RADIOACTIVE SEED IMPLANT N/A 06/10/2018   Procedure: RADIOACTIVE SEED IMPLANT/BRACHYTHERAPY IMPLANT;  Surgeon: Cleon Gustin, MD;  [facility];  Service: Urology;  Laterality: N/A;   REPLACEMENT TOTAL KNEE BILATERAL     BIL   RIGHT HEART CATH N/A 11/28/2019   Procedure: RIGHT HEART CATH;  Surgeon: Jolaine Artist, MD;  Location: [facility];  Service: Cardiovascular;  Laterality: N/A;   Right total hip arthroplasty.     SPACE OAR INSTILLATION N/A 06/10/2018   Procedure: SPACE OAR INSTILLATION;  Surgeon: Cleon Gustin, MD;  [facility];  Service: Urology;  Laterality: N/A;   TOTAL HIP ARTHROPLASTY Left 07/18/2017   Procedure: LEFT TOTAL HIP ARTHROPLASTY ANTERIOR APPROACH;  Surgeon: [name], MD;  [facility];  Service: Orthopedics;  Laterality: Left;   TRANSRECTAL ULTRASOUND  01/09/2018    Current Outpatient Medications  Medication Sig Dispense Refill   acetaminophen  (TYLENOL) 500 MG tablet Take 1,000 mg by mouth every 6 (six) hours as needed for moderate pain or headache.      aspirin EC 81 MG tablet Take 1 tablet (81 mg total) by mouth daily. Restart in 2 weeks     atorvastatin (LIPITOR) 40 MG tablet Take 40 mg by mouth at bedtime.      febuxostat (ULORIC) 40 MG tablet Take 40 mg by mouth daily.     furosemide (LASIX) 40 MG tablet TAKE 1 TABLET BY MOUTH EVERY DAY 90 tablet 3   glimepiride (AMARYL) 2 MG tablet Take 2 mg by mouth daily with breakfast.     metoprolol succinate (TOPROL-XL) 50 MG 24 hr tablet Take 1 tablet (50 mg total) by mouth daily. Take with or immediately following a meal. 30 tablet 5   nitroGLYCERIN (NITROSTAT) 0.4 MG SL tablet Place 0.4 mg under the tongue every 5 (five) minutes as needed for chest pain.      pantoprazole (PROTONIX) 40 MG tablet TAKE 1 TABLET BY MOUTH EVERY DAY BEFORE BREAKFAST 90 tablet 3   tamsulosin (FLOMAX) 0.4 MG CAPS capsule Take 0.4 mg by mouth in the morning.      No current facility-administered medications for this visit.    Allergies as of 02/24/2021   (No Known Allergies)    Family History  Problem Relation Age of Onset   CAD Father    Stroke Father        Deceased, 57   Hypertension Mother    Stroke Mother        Deceased, 53   Heart disease Mother    Stroke Brother        Deceased, 19   Hypertension Sister        Deceased, 55   Heart disease Sister    Healthy Son    Breast cancer Daughter    Prostate cancer Neg Hx    Colon cancer Neg Hx    Pancreatic cancer Neg Hx     Social History   Socioeconomic History   Marital status: Married    Spouse name: Dot   Number of children: 2   Years of education: Not on file   Highest education level: Not on file  Occupational History   Occupation: Marine scientist: OTHER    Comment: [employer]   Tobacco Use   Smoking status: Former    Packs/day: 1.00    Years: 42.00    Pack years: 42.00    Types: Cigarettes    Start date: 01/28/1961     Quit date: 10/30/1998    Years since quitting: 22.3   Smokeless tobacco: Never  Vaping Use   Vaping Use: Never used  Substance and Sexual Activity   Alcohol use: No    Alcohol/week: 0.0 standard drinks   Drug use: No   Sexual activity: Not Currently  Other Topics Concern   Not on file  Social History Narrative  Lives with wife in a 2 story home.  They have 2 grown children.     Retired Software engineer.     Investment banker, operational of Radio broadcast assistant Strain: Not on file  Food Insecurity: Not on file  Transportation Needs: Not on file  Physical Activity: Not on file  Stress: Not on file  Social Connections: Not on file    Review of Systems: Gen: Denies fever, chills, anorexia. Denies fatigue, weakness, weight loss.  CV: Denies chest pain, palpitations, syncope, peripheral edema, and claudication. Resp: Denies dyspnea at rest, cough, wheezing, coughing up blood, and pleurisy. GI: Denies vomiting blood, jaundice, and fecal incontinence. Denies dysphagia or odynophagia. Derm: Denies rash, itching, dry skin Psych: Denies depression, anxiety, memory loss, confusion. No homicidal or suicidal ideation.  Heme: Denies bruising, bleeding, and enlarged lymph nodes.  Physical Exam: BP 117/75 (BP Location: Right Arm, Patient Position: Sitting, Cuff Size: Large)   Pulse (!) 101   Temp (!) 97.3 F (36.3 C) (Oral)   Ht 5\' 7"  (1.702 m)   Wt 208 lb (94.3 kg)   BMI 32.58 kg/m  General:   Alert and oriented. No distress noted. Pleasant and cooperative.  Head:  Normocephalic and atraumatic. Heart: Normal rate and rhythm, s1 and s2 heart sounds present.  Lungs: Clear lung sounds in all lobes. Respirations equal and unlabored. Abdomen:  +BS, soft, non-tender and non-distended. No rebound or guarding. No HSM or masses noted. Derm: No palmar erythema or jaundice Msk:  Symmetrical without gross deformities. Normal posture. Extremities:  Without edema. Neurologic:  Alert and  oriented  x4 Psych:  Alert and cooperative. Normal mood and affect.  ASSESSMENT: [name] is a 80 y.o. adult presenting today for follow up of GERD and PUD.   Patient doing well on pantoprazole 40 mg daily, without any breakthrough reflux symptoms. Will continue with current PPI regimen.  Having BMs once per day, occasionally experiences some constipation which he takes miralax for with good result, can continue miralax as needed.  Patient counseled on risks associated with voltaren use due to hx of PUD and upper GI bleed in march 2020. Patient verbalizes understanding of risks of NSAID use and was counseled thoroughly on avoiding NSAIDs if at all possible. Denies abdominal pain, melena, weakness, dizziness or lightheadedness, no early satiety.   PLAN:  Continue protonix 40mg  daily with good result 2. Please avoid NSAIDs as much as possible given hx of PUD 3. Can continue miralax as needed for occasional constipation.  Follow Up: 1 year   Case discussed with Dr. Jenetta Downer who is in agreement with plan of care, as outline above.  Simon Llamas L. Alver Sorrow, MSN, APRN, AGNP-C Adult-Gerontology Nurse Practitioner [facility]

## 2021-02-24 NOTE — Patient Instructions (Signed)
Continue protonix 40mg  once daily As we discussed, attempt to avoid voltaren and other NSAIDs as much as possible given your history of peptic ulcers.  Follow up in 1 year  It was a pleasure providing care to you today!

## 2021-03-09 ENCOUNTER — Ambulatory Visit (INDEPENDENT_AMBULATORY_CARE_PROVIDER_SITE_OTHER): Payer: Medicare Other

## 2021-03-09 DIAGNOSIS — I495 Sick sinus syndrome: Secondary | ICD-10-CM

## 2021-03-10 LAB — CUP PACEART REMOTE DEVICE CHECK
Battery Impedance: 493 Ohm
Battery Remaining Longevity: 92 mo
Battery Voltage: 2.78 V
Brady Statistic AP VP Percent: 0 %
Brady Statistic AP VS Percent: 52 %
Brady Statistic AS VP Percent: 0 %
Brady Statistic AS VS Percent: 47 %
Date Time Interrogation Session: 20220811100511
Implantable Lead Implant Date: 20000419
Implantable Lead Implant Date: 20000419
Implantable Lead Location: 753859
Implantable Lead Location: 753860
Implantable Lead Model: 4023
Implantable Pulse Generator Implant Date: 20150831
Lead Channel Impedance Value: 359 Ohm
Lead Channel Impedance Value: 437 Ohm
Lead Channel Pacing Threshold Amplitude: 0.375 V
Lead Channel Pacing Threshold Amplitude: 0.75 V
Lead Channel Pacing Threshold Pulse Width: 0.4 ms
Lead Channel Pacing Threshold Pulse Width: 0.4 ms
Lead Channel Setting Pacing Amplitude: 2 V
Lead Channel Setting Pacing Amplitude: 2.5 V
Lead Channel Setting Pacing Pulse Width: 0.4 ms
Lead Channel Setting Sensing Sensitivity: 2.8 mV

## 2021-03-15 DIAGNOSIS — E1122 Type 2 diabetes mellitus with diabetic chronic kidney disease: Secondary | ICD-10-CM | POA: Diagnosis not present

## 2021-03-15 DIAGNOSIS — E1165 Type 2 diabetes mellitus with hyperglycemia: Secondary | ICD-10-CM | POA: Diagnosis not present

## 2021-03-15 DIAGNOSIS — I10 Essential (primary) hypertension: Secondary | ICD-10-CM | POA: Diagnosis not present

## 2021-03-15 DIAGNOSIS — Z299 Encounter for prophylactic measures, unspecified: Secondary | ICD-10-CM | POA: Diagnosis not present

## 2021-03-15 DIAGNOSIS — R030 Elevated blood-pressure reading, without diagnosis of hypertension: Secondary | ICD-10-CM | POA: Diagnosis not present

## 2021-03-15 DIAGNOSIS — N183 Chronic kidney disease, stage 3 unspecified: Secondary | ICD-10-CM | POA: Diagnosis not present

## 2021-03-15 DIAGNOSIS — Z6834 Body mass index (BMI) 34.0-34.9, adult: Secondary | ICD-10-CM | POA: Diagnosis not present

## 2021-03-31 NOTE — Progress Notes (Signed)
Remote pacemaker transmission.   

## 2021-05-06 ENCOUNTER — Encounter: Payer: Self-pay | Admitting: Internal Medicine

## 2021-05-06 ENCOUNTER — Ambulatory Visit (INDEPENDENT_AMBULATORY_CARE_PROVIDER_SITE_OTHER): Payer: Medicare Other | Admitting: Internal Medicine

## 2021-05-06 VITALS — BP 136/84 | HR 69 | Ht 65.0 in | Wt 205.4 lb

## 2021-05-06 DIAGNOSIS — I495 Sick sinus syndrome: Secondary | ICD-10-CM | POA: Diagnosis not present

## 2021-05-06 DIAGNOSIS — N184 Chronic kidney disease, stage 4 (severe): Secondary | ICD-10-CM | POA: Diagnosis not present

## 2021-05-06 DIAGNOSIS — I480 Paroxysmal atrial fibrillation: Secondary | ICD-10-CM

## 2021-05-06 DIAGNOSIS — I10 Essential (primary) hypertension: Secondary | ICD-10-CM | POA: Diagnosis not present

## 2021-05-06 DIAGNOSIS — I2510 Atherosclerotic heart disease of native coronary artery without angina pectoris: Secondary | ICD-10-CM

## 2021-05-06 NOTE — Progress Notes (Signed)
PCP: Monico Blitz, MD Primary Cardiologist: Dr Haroldine Laws Primary EP:  Dr Rayann Heman  [name] is a 80 y.o. adult who presents today for routine electrophysiology followup.  Since last being seen in our clinic, the patient reports doing reasonably well.  He has SOB with moderate activity.   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 Today, he denies symptoms of palpitations, chest pain, shortness of breath,  lower extremity edema, dizziness, presyncope, or syncope.  The patient is otherwise without complaint today.   Past Medical History:  Diagnosis Date   AAA (abdominal aortic aneurysm)    COPD (chronic obstructive pulmonary disease) (HCC)    Coronary artery disease 2000   Status post CABG   Diabetes mellitus    DM2   Dyslipidemia    Dyspnea    History of kidney stones    Hypertension    Obesity    Osteoarthritis    Pacemaker    Prostate cancer ([place])    Sleep apnea    USING CPAP   Spinal stenosis    SSS (sick sinus syndrome) ([place]) 2000   s/p PPM   Past Surgical History:  Procedure Laterality Date   BIOPSY PROSTATE  01/09/2018   CATARACT EXTRACTION Left    CORONARY ARTERY BYPASS GRAFT     2000   ESOPHAGOGASTRODUODENOSCOPY N/A 10/24/2018   Procedure: ESOPHAGOGASTRODUODENOSCOPY (EGD);  Surgeon: Rogene Houston, MD;  [facility];  Service: Endoscopy;  Laterality: N/A;   ESOPHAGOGASTRODUODENOSCOPY N/A 03/19/2019   Procedure: ESOPHAGOGASTRODUODENOSCOPY (EGD);  Surgeon: Rogene Houston, MD;  [facility];  Service: Endoscopy;  Laterality: N/A;  65   JOINT REPLACEMENT     L hip Dr. Wynelle Link 07-18-17   L5-S1 Gordy Levan decompressive laminectomy     PACEMAKER INSERTION     MDT implanted for sick sinus syndrome   PERMANENT PACEMAKER GENERATOR CHANGE N/A 03/30/2014   MDT Adapta L generator change by Dr Rayann Heman   RADIOACTIVE SEED IMPLANT N/A 06/10/2018   Procedure: RADIOACTIVE SEED IMPLANT/BRACHYTHERAPY IMPLANT;  Surgeon: Cleon Gustin,  MD;  [facility];  Service: Urology;  Laterality: N/A;   REPLACEMENT TOTAL KNEE BILATERAL     BIL   RIGHT HEART CATH N/A 11/28/2019   Procedure: RIGHT HEART CATH;  Surgeon: Jolaine Artist, MD;  Location: [place] [facility];  Service: Cardiovascular;  Laterality: N/A;   Right total hip arthroplasty.     SPACE OAR INSTILLATION N/A 06/10/2018   Procedure: SPACE OAR INSTILLATION;  Surgeon: Cleon Gustin, MD;  [facility];  Service: Urology;  Laterality: N/A;   TOTAL HIP ARTHROPLASTY Left 07/18/2017   Procedure: LEFT TOTAL HIP ARTHROPLASTY ANTERIOR APPROACH;  Surgeon: [name], MD;  [facility];  Service: Orthopedics;  Laterality: Left;   TRANSRECTAL ULTRASOUND  01/09/2018    ROS- all systems are reviewed and negative except as per HPI above  Current Outpatient Medications  Medication Sig Dispense Refill   acetaminophen (TYLENOL) 500 MG tablet Take 1,000 mg by mouth every 6 (six) hours as needed for moderate pain or headache.      aspirin EC 81 MG tablet Take 1 tablet (81 mg total) by mouth daily. Restart in 2 weeks     atorvastatin (LIPITOR) 40 MG tablet Take 40 mg by mouth at bedtime.      febuxostat (ULORIC) 40 MG tablet Take 40 mg by mouth daily.     furosemide (LASIX) 40 MG tablet TAKE 1 TABLET BY MOUTH EVERY  DAY 90 tablet 3   glimepiride (AMARYL) 2 MG tablet Take 4 mg by mouth daily with breakfast.     metoprolol succinate (TOPROL-XL) 50 MG 24 hr tablet Take 1 tablet (50 mg total) by mouth daily. Take with or immediately following a meal. 30 tablet 5   nitroGLYCERIN (NITROSTAT) 0.4 MG SL tablet Place 0.4 mg under the tongue every 5 (five) minutes as needed for chest pain.      pantoprazole (PROTONIX) 40 MG tablet TAKE 1 TABLET BY MOUTH EVERY DAY BEFORE BREAKFAST 90 tablet 3   tamsulosin (FLOMAX) 0.4 MG CAPS capsule Take 0.4 mg by mouth in the morning.      No current facility-administered medications for this visit.    Physical Exam: Vitals:   05/06/21  1116  BP: 136/84  Pulse: 69  SpO2: 98%  Weight: 205 lb 6.4 oz (93.2 kg)  Height: 5\' 5"  (1.651 m)    GEN- The patient is well appearing, alert and oriented x 3 today.   Head- normocephalic, atraumatic Eyes-  Sclera clear, conjunctiva pink Ears- hearing intact Oropharynx- clear Lungs- Clear to ausculation bilaterally, normal work of breathing Chest- pacemaker pocket is well healed Heart- Regular rate and rhythm, no murmurs, rubs or gallops, PMI not laterally displaced GI- soft, NT, ND, + BS Extremities- no clubbing, cyanosis, or edema  Pacemaker interrogation- reviewed in detail today,  See PACEART report  ekg tracing ordered today is personally reviewed and shows sinus, narrow QRS  Assessment and Plan:  1. Symptomatic sinus bradycardia  Normal pacemaker function See Claudia Desanctis Art report He has a unipolar RV lead with occasional noise.  He does not V pace.  This is stable. No changes today he is not device dependant today  2. HTN Stable No change required today  3. CAD s/p CABG No ischemic symptoms No changes  4. Obesity Body mass index is 34.18 kg/m. Lifestyle modification advised  5. AAA Follows with vascular  6. 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 Continue to follow with Dr Haroldine Laws for medicine optimization.  Reports that he has not tolerated some of his CHF medicines.  GFR limits medical therapy.    7. Subclinical AF Burden is 0.2% Chads2vasc sore is 6.  Given prior GI bleed and low burden, we will defer [facility] for increased burden  Return in a year  Thompson Grayer MD, [facility] 05/06/2021 11:27 AM

## 2021-05-06 NOTE — Patient Instructions (Signed)
Medication Instructions:  Continue all current medications.  Labwork: none  Testing/Procedures: none  Follow-Up: 1 year - Dr.  Allred   Any Other Special Instructions Will Be Listed Below (If Applicable).   If you need a refill on your cardiac medications before your next appointment, please call your pharmacy.  

## 2021-05-16 DIAGNOSIS — Z20828 Contact with and (suspected) exposure to other viral communicable diseases: Secondary | ICD-10-CM | POA: Diagnosis not present

## 2021-06-08 ENCOUNTER — Ambulatory Visit (INDEPENDENT_AMBULATORY_CARE_PROVIDER_SITE_OTHER): Payer: Medicare Other

## 2021-06-08 DIAGNOSIS — I495 Sick sinus syndrome: Secondary | ICD-10-CM

## 2021-06-09 LAB — CUP PACEART REMOTE DEVICE CHECK
Battery Impedance: 617 Ohm
Battery Remaining Longevity: 81 mo
Battery Voltage: 2.77 V
Brady Statistic AP VP Percent: 0 %
Brady Statistic AP VS Percent: 67 %
Brady Statistic AS VP Percent: 0 %
Brady Statistic AS VS Percent: 32 %
Date Time Interrogation Session: 20221110111512
Implantable Lead Implant Date: 20000419
Implantable Lead Implant Date: 20000419
Implantable Lead Location: 753859
Implantable Lead Location: 753860
Implantable Lead Model: 4023
Implantable Pulse Generator Implant Date: 20150831
Lead Channel Impedance Value: 368 Ohm
Lead Channel Impedance Value: 461 Ohm
Lead Channel Pacing Threshold Amplitude: 0.375 V
Lead Channel Pacing Threshold Amplitude: 0.75 V
Lead Channel Pacing Threshold Pulse Width: 0.4 ms
Lead Channel Pacing Threshold Pulse Width: 0.4 ms
Lead Channel Setting Pacing Amplitude: 2 V
Lead Channel Setting Pacing Amplitude: 2.5 V
Lead Channel Setting Pacing Pulse Width: 0.4 ms
Lead Channel Setting Sensing Sensitivity: 2.8 mV

## 2021-06-15 DIAGNOSIS — Z20828 Contact with and (suspected) exposure to other viral communicable diseases: Secondary | ICD-10-CM | POA: Diagnosis not present

## 2021-06-16 DIAGNOSIS — I10 Essential (primary) hypertension: Secondary | ICD-10-CM | POA: Diagnosis not present

## 2021-06-16 DIAGNOSIS — Z6834 Body mass index (BMI) 34.0-34.9, adult: Secondary | ICD-10-CM | POA: Diagnosis not present

## 2021-06-16 DIAGNOSIS — Z23 Encounter for immunization: Secondary | ICD-10-CM | POA: Diagnosis not present

## 2021-06-16 DIAGNOSIS — Z299 Encounter for prophylactic measures, unspecified: Secondary | ICD-10-CM | POA: Diagnosis not present

## 2021-06-16 DIAGNOSIS — E1122 Type 2 diabetes mellitus with diabetic chronic kidney disease: Secondary | ICD-10-CM | POA: Diagnosis not present

## 2021-06-16 DIAGNOSIS — N184 Chronic kidney disease, stage 4 (severe): Secondary | ICD-10-CM | POA: Diagnosis not present

## 2021-06-16 DIAGNOSIS — E1165 Type 2 diabetes mellitus with hyperglycemia: Secondary | ICD-10-CM | POA: Diagnosis not present

## 2021-06-17 NOTE — Progress Notes (Signed)
Remote pacemaker transmission.   

## 2021-06-20 DIAGNOSIS — I129 Hypertensive chronic kidney disease with stage 1 through stage 4 chronic kidney disease, or unspecified chronic kidney disease: Secondary | ICD-10-CM | POA: Diagnosis not present

## 2021-06-20 DIAGNOSIS — E1122 Type 2 diabetes mellitus with diabetic chronic kidney disease: Secondary | ICD-10-CM | POA: Diagnosis not present

## 2021-06-20 DIAGNOSIS — C61 Malignant neoplasm of prostate: Secondary | ICD-10-CM | POA: Diagnosis not present

## 2021-06-20 DIAGNOSIS — Z950 Presence of cardiac pacemaker: Secondary | ICD-10-CM | POA: Diagnosis not present

## 2021-06-20 DIAGNOSIS — D631 Anemia in chronic kidney disease: Secondary | ICD-10-CM | POA: Diagnosis not present

## 2021-06-20 DIAGNOSIS — I509 Heart failure, unspecified: Secondary | ICD-10-CM | POA: Diagnosis not present

## 2021-06-20 DIAGNOSIS — N189 Chronic kidney disease, unspecified: Secondary | ICD-10-CM | POA: Diagnosis not present

## 2021-06-20 DIAGNOSIS — N184 Chronic kidney disease, stage 4 (severe): Secondary | ICD-10-CM | POA: Diagnosis not present

## 2021-06-20 DIAGNOSIS — I714 Abdominal aortic aneurysm, without rupture, unspecified: Secondary | ICD-10-CM | POA: Diagnosis not present

## 2021-07-15 DIAGNOSIS — Z20828 Contact with and (suspected) exposure to other viral communicable diseases: Secondary | ICD-10-CM | POA: Diagnosis not present

## 2021-08-14 DIAGNOSIS — Z20828 Contact with and (suspected) exposure to other viral communicable diseases: Secondary | ICD-10-CM | POA: Diagnosis not present

## 2021-09-07 ENCOUNTER — Ambulatory Visit (INDEPENDENT_AMBULATORY_CARE_PROVIDER_SITE_OTHER): Payer: Medicare Other

## 2021-09-07 DIAGNOSIS — I495 Sick sinus syndrome: Secondary | ICD-10-CM | POA: Diagnosis not present

## 2021-09-08 DIAGNOSIS — Z20822 Contact with and (suspected) exposure to covid-19: Secondary | ICD-10-CM | POA: Diagnosis not present

## 2021-09-08 LAB — CUP PACEART REMOTE DEVICE CHECK
Battery Impedance: 692 Ohm
Battery Remaining Longevity: 76 mo
Battery Voltage: 2.78 V
Brady Statistic AP VP Percent: 0 %
Brady Statistic AP VS Percent: 61 %
Brady Statistic AS VP Percent: 0 %
Brady Statistic AS VS Percent: 39 %
Date Time Interrogation Session: 20230209101831
Implantable Lead Implant Date: 20000419
Implantable Lead Implant Date: 20000419
Implantable Lead Location: 753859
Implantable Lead Location: 753860
Implantable Lead Model: 4023
Implantable Pulse Generator Implant Date: 20150831
Lead Channel Impedance Value: 342 Ohm
Lead Channel Impedance Value: 429 Ohm
Lead Channel Pacing Threshold Amplitude: 0.375 V
Lead Channel Pacing Threshold Amplitude: 0.75 V
Lead Channel Pacing Threshold Pulse Width: 0.4 ms
Lead Channel Pacing Threshold Pulse Width: 0.4 ms
Lead Channel Setting Pacing Amplitude: 2 V
Lead Channel Setting Pacing Amplitude: 2.5 V
Lead Channel Setting Pacing Pulse Width: 0.4 ms
Lead Channel Setting Sensing Sensitivity: 2.8 mV

## 2021-09-12 NOTE — Progress Notes (Signed)
Remote pacemaker transmission.   

## 2021-09-22 DIAGNOSIS — Z87891 Personal history of nicotine dependence: Secondary | ICD-10-CM | POA: Diagnosis not present

## 2021-09-22 DIAGNOSIS — Z299 Encounter for prophylactic measures, unspecified: Secondary | ICD-10-CM | POA: Diagnosis not present

## 2021-09-22 DIAGNOSIS — I5032 Chronic diastolic (congestive) heart failure: Secondary | ICD-10-CM | POA: Diagnosis not present

## 2021-09-22 DIAGNOSIS — E1165 Type 2 diabetes mellitus with hyperglycemia: Secondary | ICD-10-CM | POA: Diagnosis not present

## 2021-09-22 DIAGNOSIS — I10 Essential (primary) hypertension: Secondary | ICD-10-CM | POA: Diagnosis not present

## 2021-09-22 DIAGNOSIS — Z6834 Body mass index (BMI) 34.0-34.9, adult: Secondary | ICD-10-CM | POA: Diagnosis not present

## 2021-09-22 DIAGNOSIS — I739 Peripheral vascular disease, unspecified: Secondary | ICD-10-CM | POA: Diagnosis not present

## 2021-10-05 DIAGNOSIS — E7800 Pure hypercholesterolemia, unspecified: Secondary | ICD-10-CM | POA: Diagnosis not present

## 2021-10-05 DIAGNOSIS — Z299 Encounter for prophylactic measures, unspecified: Secondary | ICD-10-CM | POA: Diagnosis not present

## 2021-10-05 DIAGNOSIS — I5032 Chronic diastolic (congestive) heart failure: Secondary | ICD-10-CM | POA: Diagnosis not present

## 2021-10-05 DIAGNOSIS — Z1339 Encounter for screening examination for other mental health and behavioral disorders: Secondary | ICD-10-CM | POA: Diagnosis not present

## 2021-10-05 DIAGNOSIS — Z0000 Encounter for general adult medical examination without abnormal findings: Secondary | ICD-10-CM | POA: Diagnosis not present

## 2021-10-05 DIAGNOSIS — Z6834 Body mass index (BMI) 34.0-34.9, adult: Secondary | ICD-10-CM | POA: Diagnosis not present

## 2021-10-05 DIAGNOSIS — Z1331 Encounter for screening for depression: Secondary | ICD-10-CM | POA: Diagnosis not present

## 2021-10-05 DIAGNOSIS — Z125 Encounter for screening for malignant neoplasm of prostate: Secondary | ICD-10-CM | POA: Diagnosis not present

## 2021-10-05 DIAGNOSIS — Z7189 Other specified counseling: Secondary | ICD-10-CM | POA: Diagnosis not present

## 2021-10-05 DIAGNOSIS — Z79899 Other long term (current) drug therapy: Secondary | ICD-10-CM | POA: Diagnosis not present

## 2021-10-05 DIAGNOSIS — I714 Abdominal aortic aneurysm, without rupture, unspecified: Secondary | ICD-10-CM | POA: Diagnosis not present

## 2021-10-05 DIAGNOSIS — Z87891 Personal history of nicotine dependence: Secondary | ICD-10-CM | POA: Diagnosis not present

## 2021-10-05 DIAGNOSIS — R5383 Other fatigue: Secondary | ICD-10-CM | POA: Diagnosis not present

## 2021-10-05 DIAGNOSIS — I10 Essential (primary) hypertension: Secondary | ICD-10-CM | POA: Diagnosis not present

## 2021-10-10 DIAGNOSIS — I714 Abdominal aortic aneurysm, without rupture, unspecified: Secondary | ICD-10-CM | POA: Diagnosis not present

## 2021-10-13 DIAGNOSIS — Z20828 Contact with and (suspected) exposure to other viral communicable diseases: Secondary | ICD-10-CM | POA: Diagnosis not present

## 2021-10-13 DIAGNOSIS — I509 Heart failure, unspecified: Secondary | ICD-10-CM | POA: Diagnosis not present

## 2021-10-13 DIAGNOSIS — E1122 Type 2 diabetes mellitus with diabetic chronic kidney disease: Secondary | ICD-10-CM | POA: Diagnosis not present

## 2021-10-13 DIAGNOSIS — I714 Abdominal aortic aneurysm, without rupture, unspecified: Secondary | ICD-10-CM | POA: Diagnosis not present

## 2021-10-13 DIAGNOSIS — D631 Anemia in chronic kidney disease: Secondary | ICD-10-CM | POA: Diagnosis not present

## 2021-10-13 DIAGNOSIS — N184 Chronic kidney disease, stage 4 (severe): Secondary | ICD-10-CM | POA: Diagnosis not present

## 2021-10-13 DIAGNOSIS — C61 Malignant neoplasm of prostate: Secondary | ICD-10-CM | POA: Diagnosis not present

## 2021-10-13 DIAGNOSIS — I129 Hypertensive chronic kidney disease with stage 1 through stage 4 chronic kidney disease, or unspecified chronic kidney disease: Secondary | ICD-10-CM | POA: Diagnosis not present

## 2021-10-13 DIAGNOSIS — Z950 Presence of cardiac pacemaker: Secondary | ICD-10-CM | POA: Diagnosis not present

## 2021-11-09 DIAGNOSIS — Z20822 Contact with and (suspected) exposure to covid-19: Secondary | ICD-10-CM | POA: Diagnosis not present

## 2021-11-17 DIAGNOSIS — Z20822 Contact with and (suspected) exposure to covid-19: Secondary | ICD-10-CM | POA: Diagnosis not present

## 2021-12-07 ENCOUNTER — Ambulatory Visit (INDEPENDENT_AMBULATORY_CARE_PROVIDER_SITE_OTHER): Payer: Medicare Other

## 2021-12-07 DIAGNOSIS — I495 Sick sinus syndrome: Secondary | ICD-10-CM

## 2021-12-07 DIAGNOSIS — Z20822 Contact with and (suspected) exposure to covid-19: Secondary | ICD-10-CM | POA: Diagnosis not present

## 2021-12-08 LAB — CUP PACEART REMOTE DEVICE CHECK
Battery Impedance: 793 Ohm
Battery Remaining Longevity: 72 mo
Battery Voltage: 2.77 V
Brady Statistic AP VP Percent: 0 %
Brady Statistic AP VS Percent: 60 %
Brady Statistic AS VP Percent: 0 %
Brady Statistic AS VS Percent: 39 %
Date Time Interrogation Session: 20230510131152
Implantable Lead Implant Date: 20000419
Implantable Lead Implant Date: 20000419
Implantable Lead Location: 753859
Implantable Lead Location: 753860
Implantable Lead Model: 4023
Implantable Pulse Generator Implant Date: 20150831
Lead Channel Impedance Value: 351 Ohm
Lead Channel Impedance Value: 443 Ohm
Lead Channel Pacing Threshold Amplitude: 0.375 V
Lead Channel Pacing Threshold Amplitude: 0.875 V
Lead Channel Pacing Threshold Pulse Width: 0.4 ms
Lead Channel Pacing Threshold Pulse Width: 0.4 ms
Lead Channel Setting Pacing Amplitude: 2 V
Lead Channel Setting Pacing Amplitude: 2.5 V
Lead Channel Setting Pacing Pulse Width: 0.4 ms
Lead Channel Setting Sensing Sensitivity: 2.8 mV

## 2021-12-16 NOTE — Progress Notes (Signed)
Remote pacemaker transmission.   

## 2021-12-27 DIAGNOSIS — I5032 Chronic diastolic (congestive) heart failure: Secondary | ICD-10-CM | POA: Diagnosis not present

## 2021-12-27 DIAGNOSIS — Z6834 Body mass index (BMI) 34.0-34.9, adult: Secondary | ICD-10-CM | POA: Diagnosis not present

## 2021-12-27 DIAGNOSIS — M1990 Unspecified osteoarthritis, unspecified site: Secondary | ICD-10-CM | POA: Diagnosis not present

## 2021-12-27 DIAGNOSIS — I10 Essential (primary) hypertension: Secondary | ICD-10-CM | POA: Diagnosis not present

## 2021-12-27 DIAGNOSIS — E1165 Type 2 diabetes mellitus with hyperglycemia: Secondary | ICD-10-CM | POA: Diagnosis not present

## 2021-12-27 DIAGNOSIS — Z299 Encounter for prophylactic measures, unspecified: Secondary | ICD-10-CM | POA: Diagnosis not present

## 2022-01-01 ENCOUNTER — Other Ambulatory Visit (HOSPITAL_COMMUNITY): Payer: Self-pay | Admitting: Internal Medicine

## 2022-01-29 ENCOUNTER — Other Ambulatory Visit (INDEPENDENT_AMBULATORY_CARE_PROVIDER_SITE_OTHER): Payer: Self-pay | Admitting: Gastroenterology

## 2022-02-15 DIAGNOSIS — N2581 Secondary hyperparathyroidism of renal origin: Secondary | ICD-10-CM | POA: Diagnosis not present

## 2022-02-15 DIAGNOSIS — I129 Hypertensive chronic kidney disease with stage 1 through stage 4 chronic kidney disease, or unspecified chronic kidney disease: Secondary | ICD-10-CM | POA: Diagnosis not present

## 2022-02-15 DIAGNOSIS — D631 Anemia in chronic kidney disease: Secondary | ICD-10-CM | POA: Diagnosis not present

## 2022-02-15 DIAGNOSIS — E1122 Type 2 diabetes mellitus with diabetic chronic kidney disease: Secondary | ICD-10-CM | POA: Diagnosis not present

## 2022-02-15 DIAGNOSIS — N184 Chronic kidney disease, stage 4 (severe): Secondary | ICD-10-CM | POA: Diagnosis not present

## 2022-02-15 DIAGNOSIS — I5020 Unspecified systolic (congestive) heart failure: Secondary | ICD-10-CM | POA: Diagnosis not present

## 2022-03-08 ENCOUNTER — Ambulatory Visit (INDEPENDENT_AMBULATORY_CARE_PROVIDER_SITE_OTHER): Payer: Medicare Other

## 2022-03-08 DIAGNOSIS — I495 Sick sinus syndrome: Secondary | ICD-10-CM | POA: Diagnosis not present

## 2022-03-08 LAB — CUP PACEART REMOTE DEVICE CHECK
Battery Impedance: 867 Ohm
Battery Remaining Longevity: 68 mo
Battery Voltage: 2.76 V
Brady Statistic AP VP Percent: 0 %
Brady Statistic AP VS Percent: 64 %
Brady Statistic AS VP Percent: 0 %
Brady Statistic AS VS Percent: 36 %
Date Time Interrogation Session: 20230809114653
Implantable Lead Implant Date: 20000419
Implantable Lead Implant Date: 20000419
Implantable Lead Location: 753859
Implantable Lead Location: 753860
Implantable Lead Model: 4023
Implantable Pulse Generator Implant Date: 20150831
Lead Channel Impedance Value: 359 Ohm
Lead Channel Impedance Value: 451 Ohm
Lead Channel Pacing Threshold Amplitude: 0.5 V
Lead Channel Pacing Threshold Amplitude: 0.875 V
Lead Channel Pacing Threshold Pulse Width: 0.4 ms
Lead Channel Pacing Threshold Pulse Width: 0.4 ms
Lead Channel Setting Pacing Amplitude: 2 V
Lead Channel Setting Pacing Amplitude: 2.5 V
Lead Channel Setting Pacing Pulse Width: 0.4 ms
Lead Channel Setting Sensing Sensitivity: 4 mV

## 2022-03-13 ENCOUNTER — Other Ambulatory Visit (HOSPITAL_COMMUNITY): Payer: Self-pay | Admitting: Internal Medicine

## 2022-03-30 ENCOUNTER — Other Ambulatory Visit (HOSPITAL_COMMUNITY): Payer: Self-pay | Admitting: Internal Medicine

## 2022-03-30 ENCOUNTER — Other Ambulatory Visit (INDEPENDENT_AMBULATORY_CARE_PROVIDER_SITE_OTHER): Payer: Self-pay | Admitting: Gastroenterology

## 2022-03-31 DIAGNOSIS — Z299 Encounter for prophylactic measures, unspecified: Secondary | ICD-10-CM | POA: Diagnosis not present

## 2022-03-31 DIAGNOSIS — I5032 Chronic diastolic (congestive) heart failure: Secondary | ICD-10-CM | POA: Diagnosis not present

## 2022-03-31 DIAGNOSIS — I10 Essential (primary) hypertension: Secondary | ICD-10-CM | POA: Diagnosis not present

## 2022-03-31 DIAGNOSIS — Z6834 Body mass index (BMI) 34.0-34.9, adult: Secondary | ICD-10-CM | POA: Diagnosis not present

## 2022-03-31 DIAGNOSIS — M19012 Primary osteoarthritis, left shoulder: Secondary | ICD-10-CM | POA: Diagnosis not present

## 2022-03-31 DIAGNOSIS — E1165 Type 2 diabetes mellitus with hyperglycemia: Secondary | ICD-10-CM | POA: Diagnosis not present

## 2022-04-04 ENCOUNTER — Encounter: Payer: Self-pay | Admitting: Internal Medicine

## 2022-04-11 NOTE — Progress Notes (Signed)
Remote pacemaker transmission.   

## 2022-04-22 DIAGNOSIS — Z23 Encounter for immunization: Secondary | ICD-10-CM | POA: Diagnosis not present

## 2022-05-05 ENCOUNTER — Encounter: Payer: Medicare Other | Admitting: Internal Medicine

## 2022-05-09 ENCOUNTER — Encounter: Payer: Self-pay | Admitting: Cardiovascular Disease

## 2022-05-09 ENCOUNTER — Ambulatory Visit: Payer: Medicare Other | Attending: Internal Medicine | Admitting: Cardiovascular Disease

## 2022-05-09 VITALS — BP 134/80 | HR 84 | Ht 65.0 in | Wt 197.0 lb

## 2022-05-09 DIAGNOSIS — H353131 Nonexudative age-related macular degeneration, bilateral, early dry stage: Secondary | ICD-10-CM | POA: Diagnosis not present

## 2022-05-09 DIAGNOSIS — I495 Sick sinus syndrome: Secondary | ICD-10-CM | POA: Insufficient documentation

## 2022-05-09 NOTE — Patient Instructions (Addendum)
Medication Instructions:  Continue all current medications.  Labwork: none  Testing/Procedures: none  Follow-Up: 1 year   Any Other Special Instructions Will Be Listed Below (If Applicable).  If you need a refill on your cardiac medications before your next appointment, please call your pharmacy.  

## 2022-05-09 NOTE — Progress Notes (Signed)
PCP: Monico Blitz, MD Primary Cardiologist: Dr Haroldine Laws Primary EP:  Dr Myles Gip  [name] is a 81 y.o. adult who presents today for routine electrophysiology followup.   Last saw Dr. Rayann Heman in October 2022, about a year ago.      He has SOB with moderate activity. He walks with the assistance of a cane.  Today, she denies symptoms of palpitations, chest pain, shortness of breath,  lower extremity edema, dizziness, presyncope, or syncope.  The patient is otherwise without complaint today.   Past Medical History:  Diagnosis Date   AAA (abdominal aortic aneurysm) (HCC)    COPD (chronic obstructive pulmonary disease) (HCC)    Coronary artery disease 2000   Status post CABG   Diabetes mellitus    DM2   Dyslipidemia    Dyspnea    History of kidney stones    Hypertension    Obesity    Osteoarthritis    Pacemaker    Prostate cancer ([place])    Sleep apnea    USING CPAP   Spinal stenosis    SSS (sick sinus syndrome) ([place]) 2000   s/p PPM   Past Surgical History:  Procedure Laterality Date   BIOPSY PROSTATE  01/09/2018   CATARACT EXTRACTION Left    CORONARY ARTERY BYPASS GRAFT     2000   ESOPHAGOGASTRODUODENOSCOPY N/A 10/24/2018   Procedure: ESOPHAGOGASTRODUODENOSCOPY (EGD);  Surgeon: Rogene Houston, MD;  [facility];  Service: Endoscopy;  Laterality: N/A;   ESOPHAGOGASTRODUODENOSCOPY N/A 03/19/2019   Procedure: ESOPHAGOGASTRODUODENOSCOPY (EGD);  Surgeon: Rogene Houston, MD;  [facility];  Service: Endoscopy;  Laterality: N/A;  77   JOINT REPLACEMENT     L hip Dr. Wynelle Link 07-18-17   L5-S1 Gordy Levan decompressive laminectomy     PACEMAKER INSERTION     MDT implanted for sick sinus syndrome   PERMANENT PACEMAKER GENERATOR CHANGE N/A 03/30/2014   MDT Adapta L generator change by Dr Rayann Heman   RADIOACTIVE SEED IMPLANT N/A 06/10/2018   Procedure: RADIOACTIVE SEED IMPLANT/BRACHYTHERAPY IMPLANT;  Surgeon: Cleon Gustin, MD;  [facility];   Service: Urology;  Laterality: N/A;   REPLACEMENT TOTAL KNEE BILATERAL     BIL   RIGHT HEART CATH N/A 11/28/2019   Procedure: RIGHT HEART CATH;  Surgeon: Jolaine Artist, MD;  Location: [place] [facility];  Service: Cardiovascular;  Laterality: N/A;   Right total hip arthroplasty.     SPACE OAR INSTILLATION N/A 06/10/2018   Procedure: SPACE OAR INSTILLATION;  Surgeon: Cleon Gustin, MD;  [facility];  Service: Urology;  Laterality: N/A;   TOTAL HIP ARTHROPLASTY Left 07/18/2017   Procedure: LEFT TOTAL HIP ARTHROPLASTY ANTERIOR APPROACH;  Surgeon: [name], MD;  [facility];  Service: Orthopedics;  Laterality: Left;   TRANSRECTAL ULTRASOUND  01/09/2018    ROS- all systems are reviewed and negative except as per HPI above  Current Outpatient Medications  Medication Sig Dispense Refill   acetaminophen (TYLENOL) 500 MG tablet Take 1,000 mg by mouth every 6 (six) hours as needed for moderate pain or headache.      aspirin EC 81 MG tablet Take 1 tablet (81 mg total) by mouth daily. Restart in 2 weeks     atorvastatin (LIPITOR) 40 MG tablet Take 40 mg by mouth at bedtime.      febuxostat (ULORIC) 40 MG tablet Take 40 mg by mouth daily.     furosemide (LASIX) 40 MG tablet Take 1 tablet (  40 mg total) by mouth daily. NEEDS FOLLOW UP APPOINTMENT FOR ANYMORE REFILLS 90 tablet 0   glimepiride (AMARYL) 2 MG tablet Take 4 mg by mouth daily with breakfast.     metoprolol succinate (TOPROL-XL) 50 MG 24 hr tablet Take 1 tablet (50 mg total) by mouth daily. Take with or immediately following a meal. 30 tablet 5   nitroGLYCERIN (NITROSTAT) 0.4 MG SL tablet Place 0.4 mg under the tongue every 5 (five) minutes as needed for chest pain.      pantoprazole (PROTONIX) 40 MG tablet TAKE 1 TABLET BY MOUTH EVERY DAY BEFORE BREAKFAST 30 tablet 1   tamsulosin (FLOMAX) 0.4 MG CAPS capsule Take 0.4 mg by mouth in the morning.      No current facility-administered medications for this visit.     Physical Exam: Vitals:   05/09/22 1312  BP: 134/80  Pulse: 84  SpO2: 99%  Weight: 197 lb (89.4 kg)  Height: '5\' 5"'$  (1.651 m)    GEN- The patient is well appearing, alert and oriented x 3 today.   Head- normocephalic, atraumatic Eyes-  Sclera clear, conjunctiva pink Lungs- Clear to ausculation bilaterally, normal work of breathing Chest- pacemaker pocket is well healed Heart- Regular rate and rhythm, no murmurs, rubs or gallops, PMI not laterally displaced Extremities- no clubbing, cyanosis, or edema  Pacemaker interrogation- reviewed in detail today,  See PACEART report  I personally reviewed today's ECG, which shows A-pacing, inferior MI Assessment and Plan:  1. Symptomatic sinus bradycardia  Normal pacemaker function See Claudia Desanctis Art report He has a unipolar RV lead with occasional noise.  He does not V pace.  This is stable. No changes today she is not device dependant today  2. HTN Stable No change required today  3. CAD s/p CABG No ischemic symptoms No changes  4. Obesity Body mass index is 32.78 kg/m. Lifestyle modification advised  5. AAA Follows with vascular  6. 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  7. Subclinical AF Burden is 0.2% -- no AF detected on his device today Chads2vasc sore is 6.  Given prior GI bleed and low burden, we will defer [facility] for increased burden  Return in a year  Melida Quitter, MD  05/09/2022 1:30 PM

## 2022-05-30 DIAGNOSIS — N184 Chronic kidney disease, stage 4 (severe): Secondary | ICD-10-CM | POA: Diagnosis not present

## 2022-05-31 DIAGNOSIS — D631 Anemia in chronic kidney disease: Secondary | ICD-10-CM | POA: Diagnosis not present

## 2022-05-31 DIAGNOSIS — E1122 Type 2 diabetes mellitus with diabetic chronic kidney disease: Secondary | ICD-10-CM | POA: Diagnosis not present

## 2022-05-31 DIAGNOSIS — I5020 Unspecified systolic (congestive) heart failure: Secondary | ICD-10-CM | POA: Diagnosis not present

## 2022-05-31 DIAGNOSIS — N2581 Secondary hyperparathyroidism of renal origin: Secondary | ICD-10-CM | POA: Diagnosis not present

## 2022-05-31 DIAGNOSIS — I129 Hypertensive chronic kidney disease with stage 1 through stage 4 chronic kidney disease, or unspecified chronic kidney disease: Secondary | ICD-10-CM | POA: Diagnosis not present

## 2022-05-31 DIAGNOSIS — N184 Chronic kidney disease, stage 4 (severe): Secondary | ICD-10-CM | POA: Diagnosis not present

## 2022-06-07 ENCOUNTER — Ambulatory Visit (INDEPENDENT_AMBULATORY_CARE_PROVIDER_SITE_OTHER): Payer: Medicare Other

## 2022-06-07 DIAGNOSIS — I495 Sick sinus syndrome: Secondary | ICD-10-CM

## 2022-06-07 LAB — CUP PACEART REMOTE DEVICE CHECK
Battery Impedance: 998 Ohm
Battery Remaining Longevity: 63 mo
Battery Voltage: 2.76 V
Brady Statistic AP VP Percent: 0 %
Brady Statistic AP VS Percent: 66 %
Brady Statistic AS VP Percent: 0 %
Brady Statistic AS VS Percent: 34 %
Date Time Interrogation Session: 20231108103217
Implantable Lead Connection Status: 753985
Implantable Lead Connection Status: 753985
Implantable Lead Implant Date: 20000419
Implantable Lead Implant Date: 20000419
Implantable Lead Location: 753859
Implantable Lead Location: 753860
Implantable Lead Model: 4023
Implantable Pulse Generator Implant Date: 20150831
Lead Channel Impedance Value: 368 Ohm
Lead Channel Impedance Value: 457 Ohm
Lead Channel Pacing Threshold Amplitude: 0.375 V
Lead Channel Pacing Threshold Amplitude: 0.75 V
Lead Channel Pacing Threshold Pulse Width: 0.4 ms
Lead Channel Pacing Threshold Pulse Width: 0.4 ms
Lead Channel Setting Pacing Amplitude: 2 V
Lead Channel Setting Pacing Amplitude: 2.5 V
Lead Channel Setting Pacing Pulse Width: 0.4 ms
Lead Channel Setting Sensing Sensitivity: 2.8 mV
Zone Setting Status: 755011
Zone Setting Status: 755011

## 2022-06-20 NOTE — Progress Notes (Signed)
Remote pacemaker transmission.   

## 2022-06-28 ENCOUNTER — Other Ambulatory Visit (HOSPITAL_COMMUNITY): Payer: Self-pay | Admitting: Internal Medicine

## 2022-07-05 DIAGNOSIS — Z299 Encounter for prophylactic measures, unspecified: Secondary | ICD-10-CM | POA: Diagnosis not present

## 2022-07-05 DIAGNOSIS — E1165 Type 2 diabetes mellitus with hyperglycemia: Secondary | ICD-10-CM | POA: Diagnosis not present

## 2022-07-05 DIAGNOSIS — E1122 Type 2 diabetes mellitus with diabetic chronic kidney disease: Secondary | ICD-10-CM | POA: Diagnosis not present

## 2022-07-05 DIAGNOSIS — I10 Essential (primary) hypertension: Secondary | ICD-10-CM | POA: Diagnosis not present

## 2022-07-05 DIAGNOSIS — L8990 Pressure ulcer of unspecified site, unspecified stage: Secondary | ICD-10-CM | POA: Diagnosis not present

## 2022-07-05 DIAGNOSIS — Z6833 Body mass index (BMI) 33.0-33.9, adult: Secondary | ICD-10-CM | POA: Diagnosis not present

## 2022-07-31 DIAGNOSIS — I4891 Unspecified atrial fibrillation: Secondary | ICD-10-CM | POA: Diagnosis not present

## 2022-07-31 DIAGNOSIS — R000 Tachycardia, unspecified: Secondary | ICD-10-CM | POA: Diagnosis not present

## 2022-07-31 DIAGNOSIS — R42 Dizziness and giddiness: Secondary | ICD-10-CM | POA: Diagnosis not present

## 2022-07-31 DIAGNOSIS — I491 Atrial premature depolarization: Secondary | ICD-10-CM | POA: Diagnosis not present

## 2022-07-31 DIAGNOSIS — R0902 Hypoxemia: Secondary | ICD-10-CM | POA: Diagnosis not present

## 2022-08-01 ENCOUNTER — Inpatient Hospital Stay (HOSPITAL_COMMUNITY): Payer: Medicare Other

## 2022-08-01 ENCOUNTER — Encounter (HOSPITAL_COMMUNITY): Payer: Self-pay

## 2022-08-01 ENCOUNTER — Emergency Department (HOSPITAL_COMMUNITY): Payer: Medicare Other

## 2022-08-01 ENCOUNTER — Other Ambulatory Visit (HOSPITAL_COMMUNITY): Payer: Self-pay | Admitting: *Deleted

## 2022-08-01 ENCOUNTER — Inpatient Hospital Stay (HOSPITAL_COMMUNITY)
Admission: EM | Admit: 2022-08-01 | Discharge: 2022-08-06 | DRG: 280 | Disposition: A | Discharge status: Home Health | Payer: Medicare Other | Attending: Family Medicine | Admitting: Family Medicine

## 2022-08-01 ENCOUNTER — Inpatient Hospital Stay (HOSPITAL_BASED_OUTPATIENT_CLINIC_OR_DEPARTMENT_OTHER): Payer: Medicare Other

## 2022-08-01 ENCOUNTER — Other Ambulatory Visit: Payer: Self-pay

## 2022-08-01 DIAGNOSIS — I5031 Acute diastolic (congestive) heart failure: Secondary | ICD-10-CM

## 2022-08-01 DIAGNOSIS — Z87891 Personal history of nicotine dependence: Secondary | ICD-10-CM

## 2022-08-01 DIAGNOSIS — I4821 Permanent atrial fibrillation: Secondary | ICD-10-CM | POA: Diagnosis present

## 2022-08-01 DIAGNOSIS — E1122 Type 2 diabetes mellitus with diabetic chronic kidney disease: Secondary | ICD-10-CM | POA: Diagnosis present

## 2022-08-01 DIAGNOSIS — R7989 Other specified abnormal findings of blood chemistry: Secondary | ICD-10-CM | POA: Diagnosis not present

## 2022-08-01 DIAGNOSIS — Z96653 Presence of artificial knee joint, bilateral: Secondary | ICD-10-CM | POA: Diagnosis present

## 2022-08-01 DIAGNOSIS — Z8546 Personal history of malignant neoplasm of prostate: Secondary | ICD-10-CM

## 2022-08-01 DIAGNOSIS — I252 Old myocardial infarction: Secondary | ICD-10-CM

## 2022-08-01 DIAGNOSIS — I495 Sick sinus syndrome: Secondary | ICD-10-CM | POA: Diagnosis present

## 2022-08-01 DIAGNOSIS — I214 Non-ST elevation (NSTEMI) myocardial infarction: Principal | ICD-10-CM | POA: Diagnosis present

## 2022-08-01 DIAGNOSIS — M109 Gout, unspecified: Secondary | ICD-10-CM | POA: Diagnosis present

## 2022-08-01 DIAGNOSIS — I951 Orthostatic hypotension: Secondary | ICD-10-CM | POA: Diagnosis present

## 2022-08-01 DIAGNOSIS — E871 Hypo-osmolality and hyponatremia: Secondary | ICD-10-CM | POA: Diagnosis present

## 2022-08-01 DIAGNOSIS — N179 Acute kidney failure, unspecified: Secondary | ICD-10-CM | POA: Diagnosis present

## 2022-08-01 DIAGNOSIS — Z66 Do not resuscitate: Secondary | ICD-10-CM | POA: Diagnosis present

## 2022-08-01 DIAGNOSIS — Z951 Presence of aortocoronary bypass graft: Secondary | ICD-10-CM

## 2022-08-01 DIAGNOSIS — Z823 Family history of stroke: Secondary | ICD-10-CM

## 2022-08-01 DIAGNOSIS — N184 Chronic kidney disease, stage 4 (severe): Secondary | ICD-10-CM | POA: Diagnosis present

## 2022-08-01 DIAGNOSIS — Z8249 Family history of ischemic heart disease and other diseases of the circulatory system: Secondary | ICD-10-CM

## 2022-08-01 DIAGNOSIS — R55 Syncope and collapse: Secondary | ICD-10-CM | POA: Insufficient documentation

## 2022-08-01 DIAGNOSIS — I959 Hypotension, unspecified: Secondary | ICD-10-CM

## 2022-08-01 DIAGNOSIS — N189 Chronic kidney disease, unspecified: Secondary | ICD-10-CM

## 2022-08-01 DIAGNOSIS — D696 Thrombocytopenia, unspecified: Secondary | ICD-10-CM | POA: Insufficient documentation

## 2022-08-01 DIAGNOSIS — I714 Abdominal aortic aneurysm, without rupture, unspecified: Secondary | ICD-10-CM | POA: Diagnosis present

## 2022-08-01 DIAGNOSIS — Z6832 Body mass index (BMI) 32.0-32.9, adult: Secondary | ICD-10-CM

## 2022-08-01 DIAGNOSIS — E11649 Type 2 diabetes mellitus with hypoglycemia without coma: Secondary | ICD-10-CM | POA: Diagnosis present

## 2022-08-01 DIAGNOSIS — M1000 Idiopathic gout, unspecified site: Secondary | ICD-10-CM

## 2022-08-01 DIAGNOSIS — I10 Essential (primary) hypertension: Secondary | ICD-10-CM | POA: Diagnosis present

## 2022-08-01 DIAGNOSIS — Z7982 Long term (current) use of aspirin: Secondary | ICD-10-CM

## 2022-08-01 DIAGNOSIS — I130 Hypertensive heart and chronic kidney disease with heart failure and stage 1 through stage 4 chronic kidney disease, or unspecified chronic kidney disease: Secondary | ICD-10-CM | POA: Diagnosis present

## 2022-08-01 DIAGNOSIS — E782 Mixed hyperlipidemia: Secondary | ICD-10-CM | POA: Diagnosis present

## 2022-08-01 DIAGNOSIS — I480 Paroxysmal atrial fibrillation: Secondary | ICD-10-CM

## 2022-08-01 DIAGNOSIS — U071 COVID-19: Secondary | ICD-10-CM | POA: Diagnosis present

## 2022-08-01 DIAGNOSIS — E669 Obesity, unspecified: Secondary | ICD-10-CM | POA: Diagnosis present

## 2022-08-01 DIAGNOSIS — I5022 Chronic systolic (congestive) heart failure: Secondary | ICD-10-CM | POA: Diagnosis present

## 2022-08-01 DIAGNOSIS — J9811 Atelectasis: Secondary | ICD-10-CM | POA: Diagnosis not present

## 2022-08-01 DIAGNOSIS — T380X5A Adverse effect of glucocorticoids and synthetic analogues, initial encounter: Secondary | ICD-10-CM | POA: Diagnosis present

## 2022-08-01 DIAGNOSIS — E1165 Type 2 diabetes mellitus with hyperglycemia: Secondary | ICD-10-CM | POA: Diagnosis present

## 2022-08-01 DIAGNOSIS — G4733 Obstructive sleep apnea (adult) (pediatric): Secondary | ICD-10-CM | POA: Diagnosis present

## 2022-08-01 DIAGNOSIS — J441 Chronic obstructive pulmonary disease with (acute) exacerbation: Secondary | ICD-10-CM | POA: Diagnosis present

## 2022-08-01 DIAGNOSIS — R7982 Elevated C-reactive protein (CRP): Secondary | ICD-10-CM | POA: Diagnosis present

## 2022-08-01 DIAGNOSIS — I5020 Unspecified systolic (congestive) heart failure: Secondary | ICD-10-CM

## 2022-08-01 DIAGNOSIS — D6959 Other secondary thrombocytopenia: Secondary | ICD-10-CM | POA: Diagnosis present

## 2022-08-01 DIAGNOSIS — I255 Ischemic cardiomyopathy: Secondary | ICD-10-CM | POA: Diagnosis present

## 2022-08-01 DIAGNOSIS — Z803 Family history of malignant neoplasm of breast: Secondary | ICD-10-CM

## 2022-08-01 DIAGNOSIS — J9601 Acute respiratory failure with hypoxia: Secondary | ICD-10-CM | POA: Diagnosis present

## 2022-08-01 DIAGNOSIS — K219 Gastro-esophageal reflux disease without esophagitis: Secondary | ICD-10-CM | POA: Diagnosis present

## 2022-08-01 DIAGNOSIS — Z7984 Long term (current) use of oral hypoglycemic drugs: Secondary | ICD-10-CM

## 2022-08-01 DIAGNOSIS — Z96643 Presence of artificial hip joint, bilateral: Secondary | ICD-10-CM | POA: Diagnosis present

## 2022-08-01 DIAGNOSIS — Z79899 Other long term (current) drug therapy: Secondary | ICD-10-CM

## 2022-08-01 DIAGNOSIS — Z043 Encounter for examination and observation following other accident: Secondary | ICD-10-CM | POA: Diagnosis not present

## 2022-08-01 DIAGNOSIS — E872 Acidosis, unspecified: Secondary | ICD-10-CM | POA: Diagnosis present

## 2022-08-01 DIAGNOSIS — Z950 Presence of cardiac pacemaker: Secondary | ICD-10-CM

## 2022-08-01 DIAGNOSIS — I25110 Atherosclerotic heart disease of native coronary artery with unstable angina pectoris: Secondary | ICD-10-CM | POA: Diagnosis present

## 2022-08-01 DIAGNOSIS — I6523 Occlusion and stenosis of bilateral carotid arteries: Secondary | ICD-10-CM | POA: Diagnosis not present

## 2022-08-01 HISTORY — DX: Unspecified systolic (congestive) heart failure: I50.20

## 2022-08-01 HISTORY — DX: Type 2 diabetes mellitus without complications: E11.9

## 2022-08-01 HISTORY — DX: Personal history of other diseases of the digestive system: Z87.19

## 2022-08-01 HISTORY — DX: Paroxysmal atrial fibrillation: I48.0

## 2022-08-01 LAB — COMPREHENSIVE METABOLIC PANEL
ALT: 19 U/L (ref 0–44)
AST: 69 U/L — ABNORMAL HIGH (ref 15–41)
Albumin: 3.4 g/dL — ABNORMAL LOW (ref 3.5–5.0)
Alkaline Phosphatase: 96 U/L (ref 38–126)
Anion gap: 13 (ref 5–15)
BUN: 57 mg/dL — ABNORMAL HIGH (ref 8–23)
CO2: 22 mmol/L (ref 22–32)
Calcium: 8.7 mg/dL — ABNORMAL LOW (ref 8.9–10.3)
Chloride: 96 mmol/L — ABNORMAL LOW (ref 98–111)
Creatinine, Ser: 3.08 mg/dL — ABNORMAL HIGH (ref 0.61–1.24)
GFR, Estimated: 20 mL/min — ABNORMAL LOW (ref >60)
Glucose, Bld: 60 mg/dL — ABNORMAL LOW (ref 70–99)
Potassium: 4 mmol/L (ref 3.5–5.1)
Sodium: 131 mmol/L — ABNORMAL LOW (ref 135–145)
Total Bilirubin: 1.8 mg/dL — ABNORMAL HIGH (ref 0.3–1.2)
Total Protein: 7.5 g/dL (ref 6.5–8.1)

## 2022-08-01 LAB — CBC WITH DIFFERENTIAL/PLATELET
Abs Immature Granulocytes: 0.06 10*3/uL (ref 0.00–0.07)
Basophils Absolute: 0 10*3/uL (ref 0.0–0.1)
Basophils Relative: 0 %
Eosinophils Absolute: 0 10*3/uL (ref 0.0–0.5)
Eosinophils Relative: 0 %
HCT: 44.3 % (ref 39.0–52.0)
Hemoglobin: 14.3 g/dL (ref 13.0–17.0)
Immature Granulocytes: 1 %
Lymphocytes Relative: 3 %
Lymphs Abs: 0.4 10*3/uL — ABNORMAL LOW (ref 0.7–4.0)
MCH: 27.9 pg (ref 26.0–34.0)
MCHC: 32.3 g/dL (ref 30.0–36.0)
MCV: 86.5 fL (ref 80.0–100.0)
Monocytes Absolute: 0.7 10*3/uL (ref 0.1–1.0)
Monocytes Relative: 7 %
Neutro Abs: 9.9 10*3/uL — ABNORMAL HIGH (ref 1.7–7.7)
Neutrophils Relative %: 89 %
Platelets: 140 10*3/uL — ABNORMAL LOW (ref 150–400)
RBC: 5.12 MIL/uL (ref 4.22–5.81)
RDW: 15.6 % — ABNORMAL HIGH (ref 11.5–15.5)
WBC: 11.1 10*3/uL — ABNORMAL HIGH (ref 4.0–10.5)
nRBC: 0 % (ref 0.0–0.2)

## 2022-08-01 LAB — CBG MONITORING, ED
Glucose-Capillary: 68 mg/dL — ABNORMAL LOW (ref 70–99)
Glucose-Capillary: 100 mg/dL — ABNORMAL HIGH (ref 70–99)
Glucose-Capillary: 107 mg/dL — ABNORMAL HIGH (ref 70–99)

## 2022-08-01 LAB — TROPONIN I (HIGH SENSITIVITY)
Troponin I (High Sensitivity): 10232 ng/L (ref <18)
Troponin I (High Sensitivity): 9832 ng/L (ref <18)
Troponin I (High Sensitivity): 8473 ng/L (ref <18)

## 2022-08-01 LAB — LACTIC ACID, PLASMA
Lactic Acid, Venous: 2.7 mmol/L (ref 0.5–1.9)
Lactic Acid, Venous: 2.2 mmol/L (ref 0.5–1.9)
Lactic Acid, Venous: 1 mmol/L (ref 0.5–1.9)

## 2022-08-01 LAB — BRAIN NATRIURETIC PEPTIDE: B Natriuretic Peptide: 1428 pg/mL — ABNORMAL HIGH (ref 0.0–100.0)

## 2022-08-01 LAB — HEPARIN LEVEL (UNFRACTIONATED)
Heparin Unfractionated: 0.51 IU/mL (ref 0.30–0.70)
Heparin Unfractionated: 0.24 IU/mL — ABNORMAL LOW (ref 0.30–0.70)

## 2022-08-01 LAB — TYPE AND SCREEN
ABO/RH(D): O POS
Antibody Screen: NEGATIVE

## 2022-08-01 LAB — RESP PANEL BY RT-PCR (RSV, FLU A&B, COVID)  RVPGX2
Influenza A by PCR: NEGATIVE
Influenza B by PCR: NEGATIVE
Resp Syncytial Virus by PCR: NEGATIVE
SARS Coronavirus 2 by RT PCR: POSITIVE — AB

## 2022-08-01 LAB — ECHOCARDIOGRAM COMPLETE
Area-P 1/2: 5.54 cm2
Calc EF: 30 %
Height: 65 in
MV M vel: 4.76 m/s
MV Peak grad: 90.6 mmHg
Radius: 0.6 cm
S' Lateral: 4.8 cm
Single Plane A2C EF: 27.8 %
Single Plane A4C EF: 29.6 %
Weight: 3156.99 oz

## 2022-08-01 MED ORDER — ALBUTEROL SULFATE HFA 108 (90 BASE) MCG/ACT IN AERS
2.0000 | INHALATION_SPRAY | Freq: Four times a day (QID) | RESPIRATORY_TRACT | Status: DC
  Administered 2022-08-01 – 2022-08-02 (×7): 2 via RESPIRATORY_TRACT
  Filled 2022-08-01 (×5): qty 6.7

## 2022-08-01 MED ORDER — DM-GUAIFENESIN ER 30-600 MG PO TB12
1.0000 | ORAL_TABLET | Freq: Two times a day (BID) | ORAL | Status: DC
  Administered 2022-08-02 – 2022-08-06 (×10): 1 via ORAL
  Filled 2022-08-01 (×12): qty 1

## 2022-08-01 MED ORDER — HYDROCOD POLI-CHLORPHE POLI ER 10-8 MG/5ML PO SUER: 5.0000 mL | Freq: Two times a day (BID) | ORAL | Status: DC | PRN

## 2022-08-01 MED ORDER — VITAMIN C 500 MG PO TABS
500.0000 mg | ORAL_TABLET | Freq: Every day | ORAL | Status: DC
  Administered 2022-08-01 – 2022-08-06 (×6): 500 mg via ORAL
  Filled 2022-08-01 (×6): qty 1

## 2022-08-01 MED ORDER — HEPARIN BOLUS VIA INFUSION
4000.0000 [IU] | Freq: Once | INTRAVENOUS | Status: AC
  Administered 2022-08-01: 4000 [IU] via INTRAVENOUS

## 2022-08-01 MED ORDER — ZINC SULFATE 220 (50 ZN) MG PO CAPS
220.0000 mg | ORAL_CAPSULE | Freq: Every day | ORAL | Status: DC
  Administered 2022-08-01 – 2022-08-06 (×6): 220 mg via ORAL
  Filled 2022-08-01 (×6): qty 1

## 2022-08-01 MED ORDER — PROCHLORPERAZINE EDISYLATE 10 MG/2ML IJ SOLN: 10.0000 mg | Freq: Four times a day (QID) | INTRAMUSCULAR | Status: DC | PRN

## 2022-08-01 MED ORDER — LACTATED RINGERS IV BOLUS
1000.0000 mL | Freq: Once | INTRAVENOUS | Status: AC
  Administered 2022-08-01: 1000 mL via INTRAVENOUS

## 2022-08-01 MED ORDER — LACTATED RINGERS IV BOLUS
500.0000 mL | Freq: Once | INTRAVENOUS | Status: AC
  Administered 2022-08-01: 500 mL via INTRAVENOUS

## 2022-08-01 MED ORDER — PANTOPRAZOLE SODIUM 40 MG PO TBEC
40.0000 mg | DELAYED_RELEASE_TABLET | Freq: Every day | ORAL | Status: DC
  Administered 2022-08-01 – 2022-08-06 (×6): 40 mg via ORAL
  Filled 2022-08-01 (×6): qty 1

## 2022-08-01 MED ORDER — FEBUXOSTAT 40 MG PO TABS
40.0000 mg | ORAL_TABLET | Freq: Every day | ORAL | Status: DC
  Administered 2022-08-01 – 2022-08-06 (×6): 40 mg via ORAL
  Filled 2022-08-01 (×6): qty 1

## 2022-08-01 MED ORDER — ATORVASTATIN CALCIUM 40 MG PO TABS
40.0000 mg | ORAL_TABLET | Freq: Every day | ORAL | Status: DC
  Administered 2022-08-01 – 2022-08-05 (×5): 40 mg via ORAL
  Filled 2022-08-01 (×5): qty 1

## 2022-08-01 MED ORDER — ASPIRIN 81 MG PO CHEW
324.0000 mg | CHEWABLE_TABLET | Freq: Once | ORAL | Status: AC
  Administered 2022-08-01: 324 mg via ORAL
  Filled 2022-08-01: qty 4

## 2022-08-01 MED ORDER — GUAIFENESIN-DM 100-10 MG/5ML PO SYRP
10.0000 mL | ORAL_SOLUTION | ORAL | Status: DC | PRN
  Administered 2022-08-03 – 2022-08-05 (×4): 10 mL via ORAL
  Filled 2022-08-01 (×5): qty 10

## 2022-08-01 MED ORDER — ACETAMINOPHEN 325 MG PO TABS
650.0000 mg | ORAL_TABLET | Freq: Four times a day (QID) | ORAL | Status: DC | PRN
  Filled 2022-08-01: qty 2

## 2022-08-01 MED ORDER — HEPARIN (PORCINE) 25000 UT/250ML-% IV SOLN
1200.0000 [IU]/h | INTRAVENOUS | Status: DC
  Administered 2022-08-01: 1100 [IU]/h via INTRAVENOUS
  Administered 2022-08-01: 1000 [IU]/h via INTRAVENOUS
  Filled 2022-08-01 (×3): qty 250

## 2022-08-01 MED ORDER — ASPIRIN 81 MG PO TBEC
81.0000 mg | DELAYED_RELEASE_TABLET | Freq: Every day | ORAL | Status: DC
  Administered 2022-08-01 – 2022-08-06 (×6): 81 mg via ORAL
  Filled 2022-08-01 (×6): qty 1

## 2022-08-01 NOTE — ED Notes (Signed)
0.24 actual heparin level per Shay in the lab.

## 2022-08-01 NOTE — ED Notes (Signed)
Pt bib [facility] from [facility]. Pt arrives AOx4 and denying pain

## 2022-08-01 NOTE — ED Notes (Signed)
Pt given breakfast tray

## 2022-08-01 NOTE — Progress Notes (Addendum)
ANTICOAGULATION CONSULT NOTE -  Pharmacy Consult for Heparin  Indication: chest pain/ACS  No Known Allergies  Patient Measurements: Height: '5\' 5"'$  (165.1 cm) Weight: 89.5 kg (197 lb 5 oz) IBW/kg (Calculated) : 61.5  Vital Signs: Temp: 97.5 F (36.4 C) (01/02 1209) Temp Source: Oral (01/02 1209) BP: 105/63 (01/02 1545) Pulse Rate: 88 (01/02 1545)  Labs: Recent Labs    08/01/22 0030 08/01/22 0242 08/01/22 0843 08/01/22 1640  HGB 14.3  --   --   --   HCT 44.3  --   --   --   PLT 140*  --   --   --   HEPARINUNFRC  --   --  0.51 0.43  CREATININE 3.08*  --   --   --   TROPONINIHS 10,232* 9,417* 8,473*  --      Estimated Creatinine Clearance: 19.3 mL/min (A) (by C-G formula based on SCr of 3.08 mg/dL (H)).   Medical History: Past Medical History:  Diagnosis Date   AAA (abdominal aortic aneurysm) (HCC)    COPD (chronic obstructive pulmonary disease) (HCC)    Coronary artery disease 2000   Status post CABG   Dyslipidemia    HFrEF (heart failure with reduced ejection fraction) (HCC)    History of GI bleed    History of kidney stones    Hypertension    Obesity    Osteoarthritis    PAF (paroxysmal atrial fibrillation) (HCC)    Prostate cancer (HCC)    Sleep apnea    USING CPAP   Spinal stenosis    SSS (sick sinus syndrome) ([place]) 2000   s/p PPM   Type 2 diabetes mellitus ([place])     Assessment: 82 y/o M with dizziness and weakness for one week. Found to have elevated high sensitivity troponin. Starting heparin.   HL therapeutic 0.43. Cardiology notes plan for medical management.   Goal of Therapy:  Heparin level 0.3-0.7 units/ml Monitor platelets by anticoagulation protocol: Yes   Plan:  Continue heparin drip at 1000 units/hr Daily heparin level checks.  Continue to monitor H&H and platelets.  Esmeralda Arthur, PharmD, BCCCP  Clinical Pharmacist 82/08/2022 5:39 PM  Addendum: 18:17 Lab called and said HL of 0.43 was run off a sample from 13:00 today, sample  from 16:40 heparin level resulted as subtherapeutic at 0.24. Will increase heparin to 1100u/hr and recheck in 8 hours.

## 2022-08-01 NOTE — ED Notes (Signed)
Critical Values Lactic 2.7  Troponin 10,232 Reported to EDP mesner at this time.  See orders

## 2022-08-01 NOTE — Consult Note (Addendum)
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  who is being seen 08/01/2022 for the evaluation of NSTEMI in the setting of covid 19 at the request of Dr. Dyann Kief.  History of Present Illness:   [name] is an 82 yr old with above PMH CABG 2000,NSTEMI 4/21 and drop in EF, ideally would opt for coronary/graft angiography. However due to elevated SCr, he had Myoview which shoed large scar and minimal ischemia, managed medically.  Last saw Dr. Haroldine Laws in 2021 and has had interval follow-up with EP.  Patient now presents with cough, weakness, near syncope, hypotension in the setting of Covid 19 infection. Troponin positive >10,000, 9832, EKG Afib 124/m. Patient is a poor historian at present and just says he wasn't feeling his normal self and was SOB for 3-4 days. Denies active chest pain, palpitations, dizziness, edema. Says he was active in the past but can't give me details.    Past Medical History:  Diagnosis Date   AAA (abdominal aortic aneurysm) (HCC)    COPD (chronic obstructive pulmonary disease) (HCC)    Coronary artery disease 2000   Status post CABG   Dyslipidemia    HFrEF (heart failure with reduced ejection fraction) (HCC)    History of GI bleed    History of kidney stones    Hypertension    Obesity    Osteoarthritis    PAF (paroxysmal atrial fibrillation) (HCC)    Prostate cancer (HCC)    Sleep apnea    USING CPAP   Spinal stenosis    SSS (sick sinus  syndrome) ([place]) 2000   s/p PPM   Type 2 diabetes mellitus ([place])     Past Surgical History:  Procedure Laterality Date   BIOPSY PROSTATE  01/09/2018   CATARACT EXTRACTION Left    CORONARY ARTERY BYPASS GRAFT     2000   ESOPHAGOGASTRODUODENOSCOPY N/A 10/24/2018   Procedure: ESOPHAGOGASTRODUODENOSCOPY (EGD);  Surgeon: Rogene Houston, MD;  [facility];  Service: Endoscopy;  Laterality: N/A;   ESOPHAGOGASTRODUODENOSCOPY N/A 03/19/2019   Procedure: ESOPHAGOGASTRODUODENOSCOPY (EGD);  Surgeon: Rogene Houston, MD;  Location: [facility];  Service: Endoscopy;  Laterality: N/A;  75   JOINT REPLACEMENT     L hip Dr. Wynelle Link 07-18-17   L5-S1 Gordy Levan decompressive laminectomy     PACEMAKER INSERTION     MDT implanted for sick sinus syndrome   PERMANENT PACEMAKER GENERATOR CHANGE N/A 03/30/2014   MDT Adapta L generator change by Dr Rayann Heman   RADIOACTIVE SEED IMPLANT N/A 06/10/2018   Procedure: RADIOACTIVE SEED IMPLANT/BRACHYTHERAPY IMPLANT;  Surgeon: Cleon Gustin, MD;  Location: [facility];  Service: Urology;  Laterality: N/A;   REPLACEMENT TOTAL KNEE BILATERAL     BIL   RIGHT HEART CATH N/A 11/28/2019   Procedure: RIGHT HEART CATH;  Surgeon: Jolaine Artist, MD;  Location: [facility];  Service: Cardiovascular;  Laterality: N/A;   Right total hip arthroplasty.  SPACE OAR INSTILLATION N/A 06/10/2018   Procedure: SPACE OAR INSTILLATION;  Surgeon: Cleon Gustin, MD;  Location: [facility];  Service: Urology;  Laterality: N/A;   TOTAL HIP ARTHROPLASTY Left 07/18/2017   Procedure: LEFT TOTAL HIP ARTHROPLASTY ANTERIOR APPROACH;  Surgeon: [name], MD;  Location: [facility];  Service: Orthopedics;  Laterality: Left;   TRANSRECTAL ULTRASOUND  01/09/2018     Home Medications:  Prior to Admission medications   Medication Sig Start Date End Date Taking? Authorizing Provider  acetaminophen (TYLENOL) 500 MG tablet Take 1,000 mg by mouth every 6 (six) hours as needed for  moderate pain or headache.    Yes [provider]  aspirin EC 81 MG tablet Take 1 tablet (81 mg total) by mouth daily. Restart in 2 weeks 10/27/18  Yes Kathie Dike, MD  atorvastatin (LIPITOR) 40 MG tablet Take 40 mg by mouth at bedtime.    Yes [provider]  cyclobenzaprine (FLEXERIL) 10 MG tablet Take 10 mg by mouth at bedtime. 07/04/22  Yes [provider]  febuxostat (ULORIC) 40 MG tablet Take 40 mg by mouth daily.   Yes [provider]  furosemide (LASIX) 40 MG tablet TAKE 1 TABLET BY MOUTH EVERY DAY 06/28/22  Yes Bensimhon, Shaune Pascal, MD  glimepiride (AMARYL) 4 MG tablet Take 4 mg by mouth daily. 07/06/22  Yes [provider]  metoprolol succinate (TOPROL-XL) 50 MG 24 hr tablet Take 1 tablet (50 mg total) by mouth daily. Take with or immediately following a meal. Patient taking differently: Take 25-50 mg by mouth daily. Take 1 tablet in the morning and 1/2 tablet in the evening 12/04/19  Yes Rosita Fire, Brittainy M, PA-C  nitroGLYCERIN (NITROSTAT) 0.4 MG SL tablet Place 0.4 mg under the tongue every 5 (five) minutes as needed for chest pain.    Yes [provider]  pantoprazole (PROTONIX) 40 MG tablet TAKE 1 TABLET BY MOUTH EVERY DAY BEFORE BREAKFAST 01/30/22  Yes Montez Morita, Quillian Quince, MD  traZODone (DESYREL) 50 MG tablet Take 50 mg by mouth at bedtime as needed for sleep. 07/06/22  Yes [provider]    Inpatient Medications: Scheduled Meds:  albuterol  2 puff Inhalation Q6H   vitamin C  500 mg Oral Daily   aspirin EC  81 mg Oral Daily   dextromethorphan-guaiFENesin  1 tablet Oral BID   zinc sulfate  220 mg Oral Daily   Continuous Infusions:  heparin 1,000 Units/hr (08/01/22 0151)   Allergies:   No Known Allergies  Social History:   Social History   Socioeconomic History   Marital status: Married    Spouse name: Dot   Number of children: 2   Years of education: Not on file   Highest education level: Not on file   Occupational History   Occupation: Marine scientist: OTHER    Comment: [employer]   Tobacco Use   Smoking status: Former    Packs/day: 1.00    Years: 42.00    Total pack years: 42.00    Types: Cigarettes    Start date: 01/28/1961    Quit date: 10/30/1998    Years since quitting: 23.7   Smokeless tobacco: Never  Vaping Use   Vaping Use: Never used  Substance and Sexual Activity   Alcohol use: No    Alcohol/week: 0.0 standard drinks of alcohol   Drug use: No   Sexual activity: Not Currently  Other Topics Concern   Not on file  Social History Narrative  Lives with wife in a 2 story home.  They have 2 grown children.     Retired Software engineer.     Investment banker, operational of Radio broadcast assistant Strain: Not on file  Food Insecurity: Not on file  Transportation Needs: Not on file  Physical Activity: Not on file  Stress: Not on file  Social Connections: Not on file  Intimate Partner Violence: Not At Risk (03/14/2018)   Humiliation, Afraid, Rape, and Kick questionnaire    Fear of Current or Ex-Partner: No    Emotionally Abused: No    Physically Abused: No    Sexually Abused: No    Family History:     Family History  Problem Relation Age of Onset   CAD Father    Stroke Father        Deceased, 73   Hypertension Mother    Stroke Mother        Deceased, 82   Heart disease Mother    Stroke Brother        Deceased, 61   Hypertension Sister        Deceased, 46   Heart disease Sister    Healthy Son    Breast cancer Daughter    Prostate cancer Neg Hx    Colon cancer Neg Hx    Pancreatic cancer Neg Hx      ROS:  Please see the history of present illness.  Review of Systems  Reason unable to perform ROS: poor historian.     Physical Exam/Data:   Vitals:   08/01/22 0830 08/01/22 0845 08/01/22 0907 08/01/22 0909  BP: 106/68 110/71 (!) 80/49 (!) 87/60  Pulse:  86 99 97  Resp:  '18 19 17  '$ Temp:      SpO2:  100% 100% 100%  Weight:      Height:         Intake/Output Summary (Last 24 hours) at 08/01/2022 0911 Last data filed at 08/01/2022 0511 Gross per 24 hour  Intake 1500 ml  Output --  Net 1500 ml      08/01/2022   12:19 AM 05/09/2022    1:12 PM 05/06/2021   11:16 AM  Last 3 Weights  Weight (lbs) 197 lb 5 oz 197 lb 205 lb 6.4 oz  Weight (kg) 89.5 kg 89.359 kg 93.169 kg     Body mass index is 32.83 kg/m.  General:  Obese, in no acute distress  HEENT: normal Neck: no JVD Vascular: No carotid bruits; Distal pulses 2+ bilaterally Cardiac:  normal S1, S2; irregularly irregular; no murmur   Lungs: diffuse rhonchi and rales  Abd: soft, nontender, no hepatomegaly  Ext: no edema Musculoskeletal:  No deformities, BUE and BLE strength normal and equal Skin: warm and dry  Neuro:  CNs 2-12 intact, no focal abnormalities noted, poor historian Psych:  Normal affect   EKG:  The EKG was personally reviewed and demonstrates:  Afib 124/m Telemetry:  Telemetry was personally reviewed and demonstrates:  AFib with IVCD, repolarization abnormalities  Relevant CV Studies:   NST 2021 Lexiscan stress: Electrically negative for ischemia Myovue scan with large defect in the anterolateral (base,mid, distal), inferolateral (base, mid, distal)and inferior (base, mid) walls that improves minimally in inferior mid region consistent with large area of scar with minimal periinfarct ischemia. Small defect in the anterior mid region that improves consistent with a small region of mild ischemia. LVEF calculated at 28% with diffuse hypokinesis. High risk scan Compared to myovue scan from 2018, anterolateral and  anterior changes are new and LVEF is worse.   Laboratory Data:  High Sensitivity Troponin:   Recent Labs  Lab 08/01/22 0030 08/01/22 0242  TROPONINIHS 10,232* 9,832*     Chemistry Recent Labs  Lab 08/01/22 0030  NA 131*  K 4.0  CL 96*  CO2 22  GLUCOSE 60*  BUN 57*  CREATININE 3.08*  CALCIUM 8.7*  GFRNONAA 20*  ANIONGAP 13     Recent Labs  Lab 08/01/22 0030  PROT 7.5  ALBUMIN 3.4*  AST 69*  ALT 19  ALKPHOS 96  BILITOT 1.8*   Hematology Recent Labs  Lab 08/01/22 0030  WBC 11.1*  RBC 5.12  HGB 14.3  HCT 44.3  MCV 86.5  MCH 27.9  MCHC 32.3  RDW 15.6*  PLT 140*   BNP Recent Labs  Lab 08/01/22 0030  BNP 1,428.0*     Radiology/Studies:  CT Head Wo Contrast  Result Date: 08/01/2022 CLINICAL DATA:  Fall EXAM: CT HEAD WITHOUT CONTRAST TECHNIQUE: Contiguous axial images were obtained from the base of the skull through the vertex without intravenous contrast. RADIATION DOSE REDUCTION: This exam was performed according to the departmental dose-optimization program which includes automated exposure control, adjustment of the mA and/or kV according to patient size and/or use of iterative reconstruction technique. COMPARISON:  None Available. FINDINGS: Brain: There is no mass, hemorrhage or extra-axial collection. There is periventricular hypoattenuation compatible with chronic microvascular disease. Vascular: Atherosclerotic calcification of the basilar artery and internal carotid arteries. Skull: Normal Sinuses/Orbits: Paranasal sinuses are clear.  Normal orbits. Other: None IMPRESSION: 1. No acute intracranial abnormality. 2. Chronic microvascular disease. Electronically Signed   By: Ulyses Jarred M.D.   On: 08/01/2022 01:27   DG Chest 2 View  Result Date: 08/01/2022 CLINICAL DATA:  Near syncope. EXAM: CHEST - 2 VIEW COMPARISON:  November 28, 2019 FINDINGS: There is a dual lead AICD. Multiple sternal wires and vascular clips are noted. The cardiac silhouette is moderately enlarged and unchanged in size. Mild atelectasis is seen within the bilateral lung bases. There is no evidence of a pleural effusion or pneumothorax. Postoperative changes are again seen throughout the mid and lower thoracic spine. IMPRESSION: 1. Stable cardiomegaly with mild bibasilar atelectasis. 2. Evidence of prior median sternotomy/CABG.  Electronically Signed   By: Virgina Norfolk M.D.   On: 08/01/2022 01:22     Assessment and Plan:   NSTEMI in the setting of Covid 19 infection, Troponins >10,000. Also in Afib (known PAF). Currently on IV heparin and asymptomatic. Crt >3. Would hold off on cath at this time since asymptomatic and AKI on CKD. Awaiting transfer to [facility].   CAD CABG 2000,NSTEMI 4/21 and drop in EF, ideally would opt for coronary/graft angiography. However due to elevated SCr, he had Myoview which shoed large scar and minimal ischemia  ICM EF 30-35%   PAF burden 0.2% by last device interrogation 04/2022. [id number] but with prior GI bleed was not anticoagulated. Now in Afib-hopefully will convert once acute infection resolves.Was on toprol xl 50 mg PTA now on hold with hypotension. BP improving. Can probably restart at lower dose. HR 90-120's  Pacemaker for symptomatic bradycardia stable at f/u 04/2022  HTN now hypotensive but improving with fluids.   Obestiy  AAA mild on CT 2020, no recent studies.  CKD Crt 3.09 to be followed by renal.  Risk Assessment/Risk Scores:     TIMI Risk Score for Unstable Angina or Non-ST Elevation MI:   The patient's TIMI risk score is  5, which indicates a 26% risk of all cause mortality, new or recurrent myocardial infarction or need for urgent revascularization in the next 14 days.    CHA2DS2-VASc Score = 6   This indicates a 9.7% annual risk of stroke. The patient's score is based upon: CHF History: 1 HTN History: 1 Diabetes History: 1 Stroke History: 0 Vascular Disease History: 1 Age Score: 2 Gender Score: 0    For questions or updates, please contact [place] Please consult [email or web address] for contact info under    Signed, Ermalinda Barrios, PA-C 08/01/2022 9:11 AM    Attending note:  Patient seen and examined.  I reviewed his records and discussed case with Ms. Vita Barley, agree with her above findings.  Mr. Mounger presents feeling poorly over  the last week, provides very limited history at this time but indicates intermittent cough and shortness of breath, apparent near syncopal episode, poor appetite, possibly vague chest discomfort but no palpitations.  He has been diagnosed with COVID-19, chest x-ray without acute infiltrates.  Head CT without acute process.  Also found to be relatively hypotensive and given IV fluids per ER.  At home he is on Toprol-XL and Lasix from a cardiac perspective, GDMT for HFrEF limited by relatively low blood pressure and also CKD stage III-IV.  Remainder of cardiac history discussed above.  He does have known PAF although typically with low rhythm burden and has not been anticoagulated with prior history of significant GI bleed.  He is in atrial fibrillation now and also has evidence of NSTEMI with initial high-sensitivity troponin I of 10,032, BNP 1428.  On examination he is awake, somewhat confused with slow response to questions and vague historical details.  Currently afebrile.  Systolic blood pressure 01X to 100s and heart rate in the 80s to 90s in atrial fibrillation by telemetry which I personally reviewed.  Lungs exhibit diffuse crackles without wheezing.  Cardiac exam with indistinct PMI and irregularly irregular rhythm without rub or gallop.  He has no peripheral edema.  Pertinent lab work includes sodium 131, potassium 4.0, BUN 57, creatinine 3.08, AST 69, ALT 19, BNP 1428, second high-sensitivity troponin I 9832, lactic acid 2.7 initially, WBC 11.1, hemoglobin 14.3, platelets 140.  I personally reviewed his ECG which shows atrial fibrillation with IVCD and nonspecific ST-T wave abnormalities.  Prior tracing in October showed an atrial paced rhythm with possible old or recent inferior infarct pattern.  Last echocardiogram was in 2021 at which point LVEF was 30 to 35%, moderate RV dysfunction associated with moderately elevated RVSP as well.  Patient currently awaiting transfer to hospitalist service at  [facility] for further care.  Supportive measures/treatment for COVID-19 per primary team.  From a cardiac perspective would manage medically at this time including low-dose aspirin, baseline statin therapy, and likely 48-hour course of IV heparin if tolerated.  Check follow-up echocardiogram to reassess LVEF.  Question of type I versus type II NSTEMI to be considered (ACS versus myocarditis specifically). May be able to resume beta-blocker ultimately depending on blood pressure response.  If RVR becomes an issue could consider temporary use of IV amiodarone, but will hold off for now.  Patient is not an optimal candidate for invasive cardiac assessment/cardiac catheterization particularly in light of his degree of renal insufficiency.  Prognosis is guarded at this time.  Our cardiology service will continue to follow at [facility].  Satira Sark, M.D., F.A.C.C.

## 2022-08-01 NOTE — Inpatient Diabetes Management (Signed)
Inpatient Diabetes Program Recommendations  AACE/ADA: New Consensus Statement on Inpatient Glycemic Control (2015)  Target Ranges:  Prepandial:   less than 140 mg/dL      Peak postprandial:   less than 180 mg/dL (1-2 hours)      Critically ill patients:  140 - 180 mg/dL   Lab Results  Component Value Date   GLUCAP 68 (L) 08/01/2022   HGBA1C 6.5 (H) 11/27/2019    Review of Glycemic Control  Diabetes history: DM 2 Outpatient Diabetes medications: Amaryl 4 mg Daily Current orders for Inpatient glycemic control:  None glucose 68 in ED  Inpatient Diabetes Program Recommendations:    -  Consider ordering CBGs achs and Novolog "very sensitive scale" 0-6 units tid. -  Need updated A1c level  Thanks,  Tama Headings RN, MSN, BC-ADM Inpatient Diabetes Coordinator Team Pager [phone] (8a-5p)

## 2022-08-01 NOTE — ED Provider Notes (Signed)
  Physical Exam  BP 110/71 (BP Location: Right Arm)   Pulse 86   Temp 98.5 F (36.9 C)   Resp 18   Ht 5\' 5"  (1.651 m)   Wt 89.5 kg   SpO2 100%   BMI 32.83 kg/m   Physical Exam Vitals and nursing note reviewed.  Constitutional:      General: He is not in acute distress.    Appearance: He is well-developed.  HENT:     Head: Normocephalic and atraumatic.     Right Ear: External ear normal.     Left Ear: External ear normal.     Nose: Nose normal.  Eyes:     Extraocular Movements: Extraocular movements intact.     Conjunctiva/sclera: Conjunctivae normal.     Pupils: Pupils are equal, round, and reactive to light.  Cardiovascular:     Rate and Rhythm: Normal rate. Rhythm irregular.  Pulmonary:     Effort: Pulmonary effort is normal. No respiratory distress.  Musculoskeletal:     Cervical back: Normal range of motion and neck supple.  Skin:    General: Skin is warm and dry.  Neurological:     Mental Status: He is alert. Mental status is at baseline.  Psychiatric:        Mood and Affect: Mood normal.        Behavior: Behavior normal.     Procedures  Procedures  ED Course / MDM   Clinical Course as of 08/01/22 0902  Tue Aug 01, 2022  0901 Patient reassessed.  Resting comfortably.  Denies any chest pain or shortness of breath at this time.  Has had several soft blood pressures.  Still no bed available at [facility] for direct admit.  Was discussed with Dr. Fredderick Phenix for ED to ED transfer since he is having an NSTEMI and we do not have cardiology available.  Dr. Gwenlyn Perking updated and will arrange for transport at this time. [RP]    Clinical Course User Index [RP] Rondel Baton, MD   Medical Decision Making Amount and/or Complexity of Data Reviewed Labs: ordered. Radiology: ordered.  Risk OTC drugs. Prescription drug management. Decision regarding hospitalization.      Rondel Baton, MD 08/03/22 [phone]

## 2022-08-01 NOTE — ED Provider Notes (Signed)
[facility] EMERGENCY DEPARTMENT Provider Note   CSN: [id number] Arrival date & time: 08/01/22  0012     History  Chief Complaint  Patient presents with   Loss of Consciousness    [name] is a 82 y.o. male.  82 year old male who presents the ER today secondary to weakness.  Patient states has been weak on and off for about the last week or so.  States that he has had a nonproductive cough.  Tonight had an episode of significant dizziness and near syncope.  Was hypotensive on EMS arrival.  Denies any chest pain or abdominal pain.  States he did fall but does not feel he injured himself and does not have any pain anywhere related to the fall.  I reviewed the records patient has history of A-fib, CABG x 4 in 2000, pacemaker.   Loss of Consciousness      Home Medications Prior to Admission medications   Medication Sig Start Date End Date Taking? Authorizing Provider  acetaminophen (TYLENOL) 500 MG tablet Take 1,000 mg by mouth every 6 (six) hours as needed for moderate pain or headache.     [provider]  aspirin EC 81 MG tablet Take 1 tablet (81 mg total) by mouth daily. Restart in 2 weeks 10/27/18   Kathie Dike, MD  atorvastatin (LIPITOR) 40 MG tablet Take 40 mg by mouth at bedtime.     [provider]  febuxostat (ULORIC) 40 MG tablet Take 40 mg by mouth daily.    [provider]  furosemide (LASIX) 40 MG tablet TAKE 1 TABLET BY MOUTH EVERY DAY 06/28/22   Bensimhon, Shaune Pascal, MD  glimepiride (AMARYL) 2 MG tablet Take 4 mg by mouth daily with breakfast.    [provider]  metoprolol succinate (TOPROL-XL) 50 MG 24 hr tablet Take 1 tablet (50 mg total) by mouth daily. Take with or immediately following a meal. 12/04/19   Lyda Jester M, PA-C  nitroGLYCERIN (NITROSTAT) 0.4 MG SL tablet Place 0.4 mg under the tongue every 5 (five) minutes as needed for chest pain.     [provider]  pantoprazole (PROTONIX) 40 MG tablet TAKE  1 TABLET BY MOUTH EVERY DAY BEFORE BREAKFAST 01/30/22   Montez Morita, Quillian Quince, MD  tamsulosin (FLOMAX) 0.4 MG CAPS capsule Take 0.4 mg by mouth in the morning.     [provider]      Allergies    Patient has no known allergies.    Review of Systems   Review of Systems  Cardiovascular:  Positive for syncope.    Physical Exam Updated Vital Signs BP 92/68   Pulse (!) 109   Temp 98.5 F (36.9 C)   Resp 17   Ht '5\' 5"'$  (1.651 m)   Wt 89.5 kg   SpO2 97%   BMI 32.83 kg/m  Physical Exam Vitals and nursing note reviewed.  Constitutional:      Appearance: He is well-developed.  HENT:     Head: Normocephalic and atraumatic.  Eyes:     Pupils: Pupils are equal, round, and reactive to light.  Cardiovascular:     Rate and Rhythm: Tachycardia present. Rhythm irregular.  Pulmonary:     Effort: Pulmonary effort is normal. No respiratory distress.  Abdominal:     General: There is no distension.  Musculoskeletal:        General: Normal range of motion.     Cervical back: Normal range of motion.  Skin:    General:  Skin is warm and dry.  Neurological:     General: No focal deficit present.     Mental Status: He is alert.     ED Results / Procedures / Treatments   Labs (all labs ordered are listed, but only abnormal results are displayed) Labs Reviewed  CBC WITH DIFFERENTIAL/PLATELET - Abnormal; Notable for the following components:      Result Value   WBC 11.1 (*)    RDW 15.6 (*)    Platelets 140 (*)    Neutro Abs 9.9 (*)    Lymphs Abs 0.4 (*)    All other components within normal limits  COMPREHENSIVE METABOLIC PANEL - Abnormal; Notable for the following components:   Sodium 131 (*)    Chloride 96 (*)    Glucose, Bld 60 (*)    BUN 57 (*)    Creatinine, Ser 3.08 (*)    Calcium 8.7 (*)    Albumin 3.4 (*)    AST 69 (*)    Total Bilirubin 1.8 (*)    GFR, Estimated 20 (*)    All other components within normal limits  BRAIN NATRIURETIC PEPTIDE - Abnormal;  Notable for the following components:   B Natriuretic Peptide 1,428.0 (*)    All other components within normal limits  LACTIC ACID, PLASMA - Abnormal; Notable for the following components:   Lactic Acid, Venous 2.7 (*)    All other components within normal limits  LACTIC ACID, PLASMA - Abnormal; Notable for the following components:   Lactic Acid, Venous 2.2 (*)    All other components within normal limits  TROPONIN I (HIGH SENSITIVITY) - Abnormal; Notable for the following components:   Troponin I (High Sensitivity) 10,232 (*)    All other components within normal limits  RESP PANEL BY RT-PCR (RSV, FLU A&B, COVID)  [id number]  URINALYSIS, ROUTINE W REFLEX MICROSCOPIC  HEPARIN LEVEL (UNFRACTIONATED)  TYPE AND SCREEN  TROPONIN I (HIGH SENSITIVITY)    EKG EKG Interpretation  Date/Time:  Tuesday August 01 2022 00:20:33 EST Ventricular Rate:  124 PR Interval:    QRS Duration: 121 QT Interval:  348 QTC Calculation: 500 R Axis:   22 Text Interpretation: Atrial flutter with predominant 2:1 AV block Left bundle branch block Confirmed by Merrily Pew [phone]) on 08/01/2022 12:29:22 AM  Radiology CT Head Wo Contrast  Result Date: 08/01/2022 CLINICAL DATA:  Fall EXAM: CT HEAD WITHOUT CONTRAST TECHNIQUE: Contiguous axial images were obtained from the base of the skull through the vertex without intravenous contrast. RADIATION DOSE REDUCTION: This exam was performed according to the departmental dose-optimization program which includes automated exposure control, adjustment of the mA and/or kV according to patient size and/or use of iterative reconstruction technique. COMPARISON:  None Available. FINDINGS: Brain: There is no mass, hemorrhage or extra-axial collection. There is periventricular hypoattenuation compatible with chronic microvascular disease. Vascular: Atherosclerotic calcification of the basilar artery and internal carotid arteries. Skull: Normal Sinuses/Orbits: Paranasal sinuses are  clear.  Normal orbits. Other: None IMPRESSION: 1. No acute intracranial abnormality. 2. Chronic microvascular disease. Electronically Signed   By: Ulyses Jarred M.D.   On: 08/01/2022 01:27   DG Chest 2 View  Result Date: 08/01/2022 CLINICAL DATA:  Near syncope. EXAM: CHEST - 2 VIEW COMPARISON:  November 28, 2019 FINDINGS: There is a dual lead AICD. Multiple sternal wires and vascular clips are noted. The cardiac silhouette is moderately enlarged and unchanged in size. Mild atelectasis is seen within the bilateral lung bases. There is no evidence of  a pleural effusion or pneumothorax. Postoperative changes are again seen throughout the mid and lower thoracic spine. IMPRESSION: 1. Stable cardiomegaly with mild bibasilar atelectasis. 2. Evidence of prior median sternotomy/CABG. Electronically Signed   By: Virgina Norfolk M.D.   On: 08/01/2022 01:22    Procedures .Critical Care  Performed by: Merrily Pew, MD Authorized by: Merrily Pew, MD   Critical care provider statement:    Critical care time (minutes):  30   Critical care was necessary to treat or prevent imminent or life-threatening deterioration of the following conditions:  Cardiac failure   Critical care was time spent personally by me on the following activities:  Development of treatment plan with patient or surrogate, discussions with consultants, evaluation of patient's response to treatment, examination of patient, ordering and review of laboratory studies, ordering and review of radiographic studies, ordering and performing treatments and interventions, pulse oximetry, re-evaluation of patient's condition and review of old charts     Medications Ordered in ED Medications  heparin ADULT infusion 100 units/mL (25000 units/2103m) (1,000 Units/hr Intravenous New Bag/Given 08/01/22 0151)  lactated ringers bolus 1,000 mL (1,000 mLs Intravenous New Bag/Given 08/01/22 0120)  aspirin chewable tablet 324 mg (324 mg Oral Given 08/01/22 0153)   heparin bolus via infusion 4,000 Units (4,000 Units Intravenous Bolus from Bag 08/01/22 0152)    ED Course/ Medical Decision Making/ A&P                           Medical Decision Making Amount and/or Complexity of Data Reviewed Labs: ordered. Radiology: ordered.  Risk OTC drugs. Prescription drug management. Decision regarding hospitalization.  Initial concern for possible infection versus hypovolemia versus heart failure.  Soft pressures and AKI so fluids initiated.  Ultimately troponin came back at greater than 10,000, reevaluate the patient still denies any chest pain, shortness of breath.  His family also denies any of the symptoms over the last week or so.  Will await second troponin and then discussed with cardiology whether [facility] can stay here [facility] Second troponin elevated but less than first. D[facility] further/workup not available here.  Covid positive.  Ct head and CXR viewed and interpreted by myself without acute abnormalities.  D/w TRH for admission.  D/w pharmacy 2/2 CKD, NSTEMI and options for COVID treatmetn and patient does not qualify at this time.    Final Clinical Impression(s) / ED Diagnoses Final diagnoses:  AKI (acute kidney injury) ([place]  COVID  Hypotension, unspecified hypotension type  NSTEMI (non-ST elevated myocardial infarction) ([facility]    Rx / DC Orders ED Discharge Orders     None         Merrin Mcvicker, JCorene Cornea MD 08/01/22 [phone]

## 2022-08-01 NOTE — H&P (Signed)
History and Physical    Patient: [name] GNF:[id number] DOB: 15-Jul-1941 DOA: 08/01/2022 DOS: the patient was seen and examined on 08/01/2022 PCP: Monico Blitz, MD  Patient coming from: Home  Chief Complaint:  Chief Complaint  Patient presents with   Loss of Consciousness   HPI: [name] is an 82 y.o. male with medical history significant of AAA, T2DM, SSS s/p PPM, A-fib, CABG x 4 (2000), hypertension, hyperlipidemia, GERD, obesity who presents to the emergency department via EMS due to generalized weakness and productive cough with production of brown mucus which has been ongoing for 5 to 7 days.  Patient had a possible syncopal episode tonight, wife at bedside states that he went to the refrigerator in the kitchen to get some water and appeared to have passed out for a few minutes.  Granddaughter at bedside states that he was confused for several minutes and appeared to have rolled his eyes upwards during the confusional state (patient has no history of seizures).  EMS was activated and on arrival of EMS team, he was noted to be hypotensive, patient denies any chest pain or abdominal pain.  Patient denies any pain from the fall.  ED Course:  In the emergency department, patient was tachycardic and intermittently tachypneic, BP was 93/60, O2 sat was 92%.  Temperature was 98.5 F.  Workup in the ED showed normal CBC except for WBC of 11.1 and platelets of 140, BNP 1428, troponin 10,232 > 9, 832.  Lactic acid 2.2.  Sodium 191, potassium 4.0, chloride 96, bicarb 22, glucose 60, BUN 37, creatinine 3.08 (baseline creatinine at 2.2-2.7), albumin 3.4, AST 69 SARS coronavirus 2 was positive.  Influenza A, B, RSV was negative. CT head without contrast showed no acute intracranial abnormality Chest x-ray showed showed stable cardiomegaly with mild bibasilar atelectasis Aspirin was given, heparin drip was started, IV hydration was provided, cardiology on-call at [facility] (Dr. Kalman Shan) was consulted and  recommended admitting patient to [facility] with plan to consult on patient when patient [facility].  Hospitalist was asked to admit patient for further evaluation and management.  Review of Systems: Review of systems as noted in the HPI. All other systems reviewed and are negative.   Past Medical History:  Diagnosis Date   AAA (abdominal aortic aneurysm) (HCC)    COPD (chronic obstructive pulmonary disease) (HCC)    Coronary artery disease 2000   Status post CABG   Diabetes mellitus    DM2   Dyslipidemia    Dyspnea    History of kidney stones    Hypertension    Obesity    Osteoarthritis    Pacemaker    Prostate cancer ([place])    Sleep apnea    USING CPAP   Spinal stenosis    SSS (sick sinus syndrome) ([place]) 2000   s/p PPM   Past Surgical History:  Procedure Laterality Date   BIOPSY PROSTATE  01/09/2018   CATARACT EXTRACTION Left    CORONARY ARTERY BYPASS GRAFT     2000   ESOPHAGOGASTRODUODENOSCOPY N/A 10/24/2018   Procedure: ESOPHAGOGASTRODUODENOSCOPY (EGD);  Surgeon: Rogene Houston, MD;  [facility];  Service: Endoscopy;  Laterality: N/A;   ESOPHAGOGASTRODUODENOSCOPY N/A 03/19/2019   Procedure: ESOPHAGOGASTRODUODENOSCOPY (EGD);  Surgeon: Rogene Houston, MD;  Location: [facility]O SU[facility];  Service: Endoscopy;  Laterality: N/A;  255   JOINT REPLACEMENT     L hip Dr. Wynelle Link 07-18-17   L5-S1 Gordy Levan decompressive laminectomy     PACEMAKER  INSERTION     MDT implanted for sick sinus syndrome   PERMANENT PACEMAKER GENERATOR CHANGE N/A 03/30/2014   MDT Adapta L generator change by Dr Rayann Heman   RADIOACTIVE SEED IMPLANT N/A 06/10/2018   Procedure: RADIOACTIVE SEED IMPLANT/BRACHYTHERAPY IMPLANT;  Surgeon: Cleon Gustin, MD;  Location: [facility] ORS;  Service: Urology;  Laterality: N/A;   REPLACEMENT TOTAL KNEE BILATERAL     BIL   RIGHT HEART CATH N/A 11/28/2019   Procedure: RIGHT HEART CATH;  Surgeon: Jolaine Artist, MD;  Location: [facility];  Service:  Cardiovascular;  Laterality: N/A;   Right total hip arthroplasty.     SPACE OAR INSTILLATION N/A 06/10/2018   Procedure: SPACE OAR INSTILLATION;  Surgeon: Cleon Gustin, MD;  Location: [facility] OR[facility];  Service: Urology;  Laterality: N/A;   TOTAL HIP ARTHROPLASTY Left 07/18/2017   Procedure: LEFT TOTAL HIP ARTHROPLASTY ANTERIOR APPROACH;  Surgeon: [name], MD;  Location: [facility] ORS;  Service: Orthopedics;  Laterality: Left;   TRANSRECTAL ULTRASOUND  01/09/2018    Social History:  reports that he quit smoking about 23 years ago. His smoking use included cigarettes. He started smoking about 61 years ago. He has a 42.00 pack-year smoking history. He has never used smokeless tobacco. He reports that he does not drink alcohol and does not use drugs.   No Known Allergies  Family History  Problem Relation Age of Onset   CAD Father    Stroke Father        Deceased, 72   Hypertension Mother    Stroke Mother        Deceased, 24   Heart disease Mother    Stroke Brother        Deceased, 45   Hypertension Sister        Deceased, 25   Heart disease Sister    Healthy Son    Breast cancer Daughter    Prostate cancer Neg Hx    Colon cancer Neg Hx    Pancreatic cancer Neg Hx      Prior to Admission medications   Medication Sig Start Date End Date Taking? Authorizing Provider  acetaminophen (TYLENOL) 500 MG tablet Take 1,000 mg by mouth every 6 (six) hours as needed for moderate pain or headache.    Yes [provider]  aspirin EC 81 MG tablet Take 1 tablet (81 mg total) by mouth daily. Restart in 2 weeks 10/27/18  Yes Kathie Dike, MD  atorvastatin (LIPITOR) 40 MG tablet Take 40 mg by mouth at bedtime.    Yes [provider]  cyclobenzaprine (FLEXERIL) 10 MG tablet Take 10 mg by mouth at bedtime. 07/04/22  Yes [provider]  febuxostat (ULORIC) 40 MG tablet Take 40 mg by mouth daily.   Yes [provider]  furosemide (LASIX) 40 MG tablet TAKE 1 TABLET BY  MOUTH EVERY DAY 06/28/22  Yes Bensimhon, Shaune Pascal, MD  glimepiride (AMARYL) 4 MG tablet Take 4 mg by mouth daily. 07/06/22  Yes [provider]  metoprolol succinate (TOPROL-XL) 50 MG 24 hr tablet Take 1 tablet (50 mg total) by mouth daily. Take with or immediately following a meal. Patient taking differently: Take 25-50 mg by mouth daily. Take 1 tablet in the morning and 1/2 tablet in the evening 12/04/19  Yes Rosita Fire, Brittainy M, PA-C  nitroGLYCERIN (NITROSTAT) 0.4 MG SL tablet Place 0.4 mg under the tongue every 5 (five) minutes as needed for chest pain.    Yes [provider]  pantoprazole (PROTONIX) 40 MG tablet TAKE 1 TABLET BY MOUTH EVERY DAY BEFORE BREAKFAST 01/30/22  Yes Montez Morita, Quillian Quince, MD  traZODone (DESYREL) 50 MG tablet Take 50 mg by mouth at bedtime as needed for sleep. 07/06/22  Yes [provider]    Physical Exam: BP (!) 88/67   Pulse 84   Temp 98.5 F (36.9 C)   Resp 18   Ht '5\' 5"'$  (1.651 m)   Wt 89.5 kg   SpO2 96%   BMI 32.83 kg/m   General: 82 y.o. year-old male ill appearing, but in no acute distress.  Alert and oriented x3. HEENT: NCAT, EOMI Neck: Supple, trachea medial Cardiovascular: Tachycardia.  Regular rate and rhythm with no rubs or gallops.  No thyromegaly or JVD noted.  No lower extremity edema. 2/4 pulses in all 4 extremities. Respiratory: Coarse breath sounds on auscultation with no wheezes.   Abdomen: Soft, nontender nondistended with normal bowel sounds x4 quadrants. Muskuloskeletal: No cyanosis, clubbing or edema noted bilaterally Neuro: CN II-XII intact, strength 5/5 x 4, sensation, reflexes intact Skin: No ulcerative lesions noted or rashes Psychiatry: Judgement and insight appear normal. Mood is appropriate for condition and setting          Labs on Admission:  Basic Metabolic Panel: Recent Labs  Lab 08/01/22 0030  NA 131*  K 4.0  CL 96*  CO2 22  GLUCOSE 60*  BUN 57*  CREATININE 3.08*  CALCIUM 8.7*    Liver Function Tests: Recent Labs  Lab 08/01/22 0030  AST 69*  ALT 19  ALKPHOS 96  BILITOT 1.8*  PROT 7.5  ALBUMIN 3.4*   No results for input(s): "LIPASE", "AMYLASE" in the last 168 hours. No results for input(s): "AMMONIA" in the last 168 hours. CBC: Recent Labs  Lab 08/01/22 0030  WBC 11.1*  NEUTROABS 9.9*  HGB 14.3  HCT 44.3  MCV 86.5  PLT 140*   Cardiac Enzymes: No results for input(s): "CKTOTAL", "CKMB", "CKMBINDEX", "TROPONINI" in the last 168 hours.  BNP (last 3 results) Recent Labs    08/01/22 0030  BNP 1,428.0*    ProBNP (last 3 results) No results for input(s): "PROBNP" in the last 8760 hours.  CBG: No results for input(s): "GLUCAP" in the last 168 hours.  Radiological Exams on Admission: CT Head Wo Contrast  Result Date: 08/01/2022 CLINICAL DATA:  Fall EXAM: CT HEAD WITHOUT CONTRAST TECHNIQUE: Contiguous axial images were obtained from the base of the skull through the vertex without intravenous contrast. RADIATION DOSE REDUCTION: This exam was performed according to the departmental dose-optimization program which includes automated exposure control, adjustment of the mA and/or kV according to patient size and/or use of iterative reconstruction technique. COMPARISON:  None Available. FINDINGS: Brain: There is no mass, hemorrhage or extra-axial collection. There is periventricular hypoattenuation compatible with chronic microvascular disease. Vascular: Atherosclerotic calcification of the basilar artery and internal carotid arteries. Skull: Normal Sinuses/Orbits: Paranasal sinuses are clear.  Normal orbits. Other: None IMPRESSION: 1. No acute intracranial abnormality. 2. Chronic microvascular disease. Electronically Signed   By: Ulyses Jarred M.D.   On: 08/01/2022 01:27   DG Chest 2 View  Result Date: 08/01/2022 CLINICAL DATA:  Near syncope. EXAM: CHEST - 2 VIEW COMPARISON:  November 28, 2019 FINDINGS: There is a dual lead AICD. Multiple sternal wires and  vascular clips are noted. The cardiac silhouette is moderately enlarged and unchanged in size. Mild atelectasis is seen within the bilateral lung bases. There is no evidence of a pleural effusion  or pneumothorax. Postoperative changes are again seen throughout the mid and lower thoracic spine. IMPRESSION: 1. Stable cardiomegaly with mild bibasilar atelectasis. 2. Evidence of prior median sternotomy/CABG. Electronically Signed   By: Virgina Norfolk M.D.   On: 08/01/2022 01:22    EKG: I independently viewed the EKG done and my findings are as followed: A-fib with RVR  Assessment/Plan Present on Admission:  NSTEMI (non-ST elevated myocardial infarction) ([place])  SSS (sick sinus syndrome) ([place])  Uncontrolled type 2 diabetes mellitus with hypoglycemia, without long-term current use of insulin (HCC)  GERD (gastroesophageal reflux disease)  Essential hypertension, benign  Acute kidney injury superimposed on chronic kidney disease (HCC)  Principal Problem:   NSTEMI (non-ST elevated myocardial infarction) ([place]) Active Problems:   Essential hypertension, benign   SSS (sick sinus syndrome) ([place])   Uncontrolled type 2 diabetes mellitus with hypoglycemia, without long-term current use of insulin (HCC)   Acute kidney injury superimposed on chronic kidney disease (HCC)   GERD (gastroesophageal reflux disease)   Syncope   COVID-19 virus infection   Lactic acidosis   Elevated brain natriuretic peptide (BNP) level   Gout   Thrombocytopenia (HCC)   Obesity (BMI 30-39.9)   NSTEMI Troponin 10,232 > 9, 832. EKG showed A-fib with RVR Aspirin 324 mg x 1 was given Continue aspirin 81 mg daily Continue heparin drip Cardiologist on-call at [facility] was consulted and recommended admitting patient to [facility] with plan to consult on patient on arrival to [facility].  ?Syncope Continue telemetry and watch for arrhythmias Troponins as described above EKG showed A-fib with RVR Echocardiogram done on 11/25/2019 showed  LVEF of 30 to 35%.  LV has moderately decreased function.  LV demonstrates global hypokinesis.  Moderate LVH.  LV diastolic parameters are indeterminate. Echocardiogram will be done to rule out significant aortic stenosis or other outflow obstruction, and also to evaluate EF and to rule out segmental/Regional wall motion abnormalities.  Carotid artery Dopplers will be done to rule out hemodynamically significant stenosis   COVID-19 virus infection Continue albuterol q.6h Paxlovid was not ordered due to patient's kidney status Continue Mucinex, Robitussin and Tussionex Continue vitamin C and zinc Continue Tylenol p.r.n. for fever Continue supplemental oxygen to maintain O2 sat > or = 94% with plan to wean patient off supplemental oxygen as tolerated (of note, patient does not use oxygen at baseline) Continue incentive spirometry and flutter valve q60mn as tolerated Continue monitoring daily inflammatory markers  Lactic acidosis Lactic acid 2.7 > 2.2 Continue to trend lactic acid  Chronically elevated BNP, rule out CHF BNP 1,428 (this was 674.8 on 04/15/2020) Continue total input/output, daily weights and fluid restriction  Lasix will be held due to soft BP Continue heart healthy diet  Echocardiogram in the morning   Type 2 diabetes mellitus with hypoglycemia CBG 60, continue CBG  SSS s/p PPM Last remote device interrogation was on 06/07/2022 and showed no events with lead parameters and battery within normal limits  Permanent atrial fibrillation Continue heparin drip Toprol-XL will be held due to soft BP  Essential hypertension Toprol-XL and Lasix will be held due to soft BP  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 Inpatient status is judged to be reasonable and necessary in order to provide the required intensity of service to ensure the patient's safety. The patient's presenting symptoms, physical exam findings, and initial radiographic and laboratory data in the context of their chronic comorbidities is felt to place them at high risk for further clinical deterioration. Furthermore, it is not anticipated that the patient will be medically stable for discharge from the hospital within 2 midnights of admission.   * I certify that at the point of admission it is my clinical judgment that the patient will require inpatient hospital care spanning beyond 2 midnights from the point of admission due to high intensity of service, high risk for further deterioration and high frequency of surveillance required.*  Author: Bernadette Hoit, DO 08/01/2022 8:34 AM  For on call review [email or web address].

## 2022-08-01 NOTE — Progress Notes (Signed)
Patient seen and examined, admitted after midnight secondary to syncope/near syncope event and also complaints of productive cough, congestion and SOB. Patient expressed chills and low grade temp 1-2 days prior to admission. He has experienced URI symptoms for the last 5 days or so and work up in ED demonstrated COVID PCR positive. Patient also found with elevated troponin (over 10,000), elevated BNP (1428), aki on CKD (Cr 3.08 , at baseline 2.2-2.7) and lactic acid of 2.2. BP soft, but with stable MAP and improving after IVF's. Case discussed with cardiology service who wants patient at [facility] for potential cath evaluation. Please refer to H7P written by Dr. Josephine Cables on 08/01/22 for further info/details on admission.  Plan: -continue heparin drip, aspirin and statin. -NPO status for now -awaiting transfer to [facility] -IVF's, Zinc, Vit C, mucolytics and bronchodilators for COVID care; patient was not hypoxic, so remdesivir not initiated, paxlovid not started given AKI. -follow clinical response.  [name] MD [phone]

## 2022-08-01 NOTE — Progress Notes (Signed)
ANTICOAGULATION CONSULT NOTE -  Pharmacy Consult for Heparin  Indication: chest pain/ACS  No Known Allergies  Patient Measurements: Height: '5\' 5"'$  (165.1 cm) Weight: 89.5 kg (197 lb 5 oz) IBW/kg (Calculated) : 61.5  Vital Signs: Temp: 98.5 F (36.9 C) (01/02 0030) BP: 111/79 (01/02 1015) Pulse Rate: 97 (01/02 0909)  Labs: Recent Labs    08/01/22 0030 08/01/22 0242 08/01/22 0843  HGB 14.3  --   --   HCT 44.3  --   --   PLT 140*  --   --   HEPARINUNFRC  --   --  0.51  CREATININE 3.08*  --   --   TROPONINIHS 10,232* 2,395* 8,473*     Estimated Creatinine Clearance: 19.3 mL/min (A) (by C-G formula based on SCr of 3.08 mg/dL (H)).   Medical History: Past Medical History:  Diagnosis Date   AAA (abdominal aortic aneurysm) (HCC)    COPD (chronic obstructive pulmonary disease) (HCC)    Coronary artery disease 2000   Status post CABG   Dyslipidemia    HFrEF (heart failure with reduced ejection fraction) (HCC)    History of GI bleed    History of kidney stones    Hypertension    Obesity    Osteoarthritis    PAF (paroxysmal atrial fibrillation) (HCC)    Prostate cancer (HCC)    Sleep apnea    USING CPAP   Spinal stenosis    SSS (sick sinus syndrome) ([place]) 2000   s/p PPM   Type 2 diabetes mellitus ([place])     Assessment: 82 y/o M with dizziness and weakness for one week. Found to have elevated high sensitivity troponin. Starting heparin.   HL 0.51- therapeutic Trop 8473 CBC WNL  Goal of Therapy:  Heparin level 0.3-0.7 units/ml Monitor platelets by anticoagulation protocol: Yes   Plan:  Continue heparin drip at 1000 units/hr Heparin level in 8 hours and daily Continue to monitor H&H and platelets.  Margot Ables, PharmD Clinical Pharmacist 08/01/2022 10:50 AM

## 2022-08-01 NOTE — ED Triage Notes (Signed)
[facility] from home. Cc of feeling weak and dizzy for a week. Pt states he passed out earlier and hit his head. EMS states when they got there patient was sitting on his walker. Pt might also have UTI per ems. Blood pressure soft and heart rate elevated.

## 2022-08-01 NOTE — Progress Notes (Signed)
ANTICOAGULATION CONSULT NOTE - Initial Consult  Pharmacy Consult for Heparin  Indication: chest pain/ACS  No Known Allergies  Patient Measurements: Height: '5\' 5"'$  (165.1 cm) Weight: 89.5 kg (197 lb 5 oz) IBW/kg (Calculated) : 61.5  Vital Signs: Temp: 98.5 F (36.9 C) (01/02 0030) BP: 92/68 (01/02 0215) Pulse Rate: 109 (01/02 0215)  Labs: Recent Labs    08/01/22 0030  HGB 14.3  HCT 44.3  PLT 140*  CREATININE 3.08*  TROPONINIHS 10,232*    Estimated Creatinine Clearance: 19.3 mL/min (A) (by C-G formula based on SCr of 3.08 mg/dL (H)).   Medical History: Past Medical History:  Diagnosis Date   AAA (abdominal aortic aneurysm) (HCC)    COPD (chronic obstructive pulmonary disease) (HCC)    Coronary artery disease 2000   Status post CABG   Diabetes mellitus    DM2   Dyslipidemia    Dyspnea    History of kidney stones    Hypertension    Obesity    Osteoarthritis    Pacemaker    Prostate cancer ([place])    Sleep apnea    USING CPAP   Spinal stenosis    SSS (sick sinus syndrome) ([place]) 2000   s/p PPM    Assessment: 82 y/o M with dizziness and weakness for one week. Found to have elevated high sensitivity troponin. Starting heparin. PTA meds reviewed. Labs above reviewed.   Goal of Therapy:  Heparin level 0.3-0.7 units/ml Monitor platelets by anticoagulation protocol: Yes   Plan:  Heparin 4000 units BOLUS Start heparin drip at 1000 units/hr 1000 Heparin level Daily CBC/Heparin level Monitor for bleeding  Narda Bonds, PharmD, BCPS Clinical Pharmacist Phone: [phone]

## 2022-08-01 NOTE — Progress Notes (Signed)
*  PRELIMINARY RESULTS* Echocardiogram 2D Echocardiogram has been performed.  [name] 08/01/2022, 11:00 AM

## 2022-08-02 DIAGNOSIS — I214 Non-ST elevation (NSTEMI) myocardial infarction: Secondary | ICD-10-CM | POA: Diagnosis not present

## 2022-08-02 LAB — CBC WITH DIFFERENTIAL/PLATELET
Abs Immature Granulocytes: 0.02 10*3/uL (ref 0.00–0.07)
Basophils Absolute: 0 10*3/uL (ref 0.0–0.1)
Basophils Relative: 0 %
Eosinophils Absolute: 0 10*3/uL (ref 0.0–0.5)
Eosinophils Relative: 1 %
HCT: 36.9 % — ABNORMAL LOW (ref 39.0–52.0)
Hemoglobin: 11.7 g/dL — ABNORMAL LOW (ref 13.0–17.0)
Immature Granulocytes: 0 %
Lymphocytes Relative: 15 %
Lymphs Abs: 0.9 10*3/uL (ref 0.7–4.0)
MCH: 27.7 pg (ref 26.0–34.0)
MCHC: 31.7 g/dL (ref 30.0–36.0)
MCV: 87.4 fL (ref 80.0–100.0)
Monocytes Absolute: 0.3 10*3/uL (ref 0.1–1.0)
Monocytes Relative: 5 %
Neutro Abs: 4.8 10*3/uL (ref 1.7–7.7)
Neutrophils Relative %: 79 %
Platelets: 118 10*3/uL — ABNORMAL LOW (ref 150–400)
RBC: 4.22 MIL/uL (ref 4.22–5.81)
RDW: 15.5 % (ref 11.5–15.5)
WBC: 6.1 10*3/uL (ref 4.0–10.5)
nRBC: 0 % (ref 0.0–0.2)

## 2022-08-02 LAB — COMPREHENSIVE METABOLIC PANEL
ALT: 31 U/L (ref 0–44)
AST: 132 U/L — ABNORMAL HIGH (ref 15–41)
Albumin: 2.6 g/dL — ABNORMAL LOW (ref 3.5–5.0)
Alkaline Phosphatase: 74 U/L (ref 38–126)
Anion gap: 11 (ref 5–15)
BUN: 58 mg/dL — ABNORMAL HIGH (ref 8–23)
CO2: 20 mmol/L — ABNORMAL LOW (ref 22–32)
Calcium: 8 mg/dL — ABNORMAL LOW (ref 8.9–10.3)
Chloride: 99 mmol/L (ref 98–111)
Creatinine, Ser: 2.39 mg/dL — ABNORMAL HIGH (ref 0.61–1.24)
GFR, Estimated: 27 mL/min — ABNORMAL LOW (ref >60)
Glucose, Bld: 142 mg/dL — ABNORMAL HIGH (ref 70–99)
Potassium: 3.6 mmol/L (ref 3.5–5.1)
Sodium: 130 mmol/L — ABNORMAL LOW (ref 135–145)
Total Bilirubin: 1.1 mg/dL (ref 0.3–1.2)
Total Protein: 5.9 g/dL — ABNORMAL LOW (ref 6.5–8.1)

## 2022-08-02 LAB — BASIC METABOLIC PANEL
Anion gap: 11 (ref 5–15)
BUN: 54 mg/dL — ABNORMAL HIGH (ref 8–23)
CO2: 18 mmol/L — ABNORMAL LOW (ref 22–32)
Calcium: 8.1 mg/dL — ABNORMAL LOW (ref 8.9–10.3)
Chloride: 98 mmol/L (ref 98–111)
Creatinine, Ser: 2.38 mg/dL — ABNORMAL HIGH (ref 0.61–1.24)
GFR, Estimated: 27 mL/min — ABNORMAL LOW (ref >60)
Glucose, Bld: 177 mg/dL — ABNORMAL HIGH (ref 70–99)
Potassium: 4.2 mmol/L (ref 3.5–5.1)
Sodium: 127 mmol/L — ABNORMAL LOW (ref 135–145)

## 2022-08-02 LAB — HEPARIN LEVEL (UNFRACTIONATED)
Heparin Unfractionated: 0.19 IU/mL — ABNORMAL LOW (ref 0.30–0.70)
Heparin Unfractionated: 0.32 IU/mL (ref 0.30–0.70)

## 2022-08-02 LAB — PHOSPHORUS: Phosphorus: 3.7 mg/dL (ref 2.5–4.6)

## 2022-08-02 LAB — CBG MONITORING, ED
Glucose-Capillary: 128 mg/dL — ABNORMAL HIGH (ref 70–99)
Glucose-Capillary: 132 mg/dL — ABNORMAL HIGH (ref 70–99)
Glucose-Capillary: 141 mg/dL — ABNORMAL HIGH (ref 70–99)
Glucose-Capillary: 158 mg/dL — ABNORMAL HIGH (ref 70–99)
Glucose-Capillary: 195 mg/dL — ABNORMAL HIGH (ref 70–99)

## 2022-08-02 LAB — CBC
HCT: 38.4 % — ABNORMAL LOW (ref 39.0–52.0)
Hemoglobin: 12.2 g/dL — ABNORMAL LOW (ref 13.0–17.0)
MCH: 27.9 pg (ref 26.0–34.0)
MCHC: 31.8 g/dL (ref 30.0–36.0)
MCV: 87.9 fL (ref 80.0–100.0)
Platelets: 119 10*3/uL — ABNORMAL LOW (ref 150–400)
RBC: 4.37 MIL/uL (ref 4.22–5.81)
RDW: 15.5 % (ref 11.5–15.5)
WBC: 6.3 10*3/uL (ref 4.0–10.5)
nRBC: 0 % (ref 0.0–0.2)

## 2022-08-02 LAB — D-DIMER, QUANTITATIVE: D-Dimer, Quant: 2.4 ug/mL-FEU — ABNORMAL HIGH (ref 0.00–0.50)

## 2022-08-02 LAB — MAGNESIUM: Magnesium: 1.9 mg/dL (ref 1.7–2.4)

## 2022-08-02 LAB — TROPONIN I (HIGH SENSITIVITY): Troponin I (High Sensitivity): 4576 ng/L (ref <18)

## 2022-08-02 LAB — C-REACTIVE PROTEIN: CRP: 13.5 mg/dL — ABNORMAL HIGH (ref <1.0)

## 2022-08-02 LAB — FERRITIN: Ferritin: 147 ng/mL (ref 24–336)

## 2022-08-02 MED ORDER — METOPROLOL TARTRATE 12.5 MG HALF TABLET
12.5000 mg | ORAL_TABLET | Freq: Two times a day (BID) | ORAL | Status: DC
  Administered 2022-08-02 – 2022-08-06 (×9): 12.5 mg via ORAL
  Filled 2022-08-02 (×9): qty 1

## 2022-08-02 MED ORDER — MAGNESIUM SULFATE 2 GM/50ML IV SOLN
2.0000 g | Freq: Once | INTRAVENOUS | Status: AC
  Administered 2022-08-02: 2 g via INTRAVENOUS
  Filled 2022-08-02: qty 50

## 2022-08-02 MED ORDER — IPRATROPIUM-ALBUTEROL 0.5-2.5 (3) MG/3ML IN SOLN
3.0000 mL | Freq: Two times a day (BID) | RESPIRATORY_TRACT | Status: DC
  Administered 2022-08-02 – 2022-08-04 (×4): 3 mL via RESPIRATORY_TRACT
  Filled 2022-08-02 (×4): qty 3

## 2022-08-02 MED ORDER — ALBUTEROL SULFATE (5 MG/ML) 0.5% IN NEBU: 2.5000 mg | INHALATION_SOLUTION | RESPIRATORY_TRACT | Status: DC | PRN

## 2022-08-02 MED ORDER — ALBUTEROL SULFATE (2.5 MG/3ML) 0.083% IN NEBU: 2.5000 mg | INHALATION_SOLUTION | RESPIRATORY_TRACT | Status: DC | PRN

## 2022-08-02 MED ORDER — HEPARIN BOLUS VIA INFUSION
2000.0000 [IU] | Freq: Once | INTRAVENOUS | Status: AC
  Administered 2022-08-02: 2000 [IU] via INTRAVENOUS
  Filled 2022-08-02: qty 2000

## 2022-08-02 MED ORDER — METHYLPREDNISOLONE SODIUM SUCC 125 MG IJ SOLR
60.0000 mg | Freq: Every day | INTRAMUSCULAR | Status: DC
  Administered 2022-08-02: 60 mg via INTRAVENOUS
  Filled 2022-08-02: qty 2

## 2022-08-02 NOTE — ED Notes (Signed)
ED TO INPATIENT HANDOFF REPORT  ED Nurse Name and Phone #: Trell Secrist RN [phone]  S Name/Age/Gender [name] 82 y.o. male Room/Bed: 001C/001C  Code Status   Code Status: Full Code  Home/SNF/Other Home Patient oriented to: self, place, time, and situation Is this baseline? Yes   Triage Complete: Triage complete  Chief Complaint NSTEMI (non-ST elevated myocardial infarction) [facility]) [I21.4]  Triage Note [facility] from home. Cc of feeling weak and dizzy for a week. Pt states he passed out earlier and hit his head. EMS states when they got there patient was sitting on his walker. Pt might also have UTI per ems. Blood pressure soft and heart rate elevated.    Allergies No Known Allergies  Level of Care/Admitting Diagnosis ED Disposition     ED Disposition  Admit   Condition  --   Comment  Hospital Area: [place] [[id number]]  Level of Care: Progressive [102]  Admit to Progressive based on following criteria: CARDIOVASCULAR & THORACIC of moderate stability with acute coronary syndrome symptoms/low risk myocardial infarction/hypertensive urgency/arrhythmias/heart failure potentially compromising stability and stable post cardiovascular intervention patients.  May admit patient to [facility] or [facility] if equivalent level of care is available:: Yes  Covid Evaluation: Confirmed COVID Positive  Diagnosis: NSTEMI (non-ST elevated myocardial infarction) [facility]) [[id number]]  Admitting Physician: Bernadette Hoit [[id number]]  Attending Physician: Bernadette Hoit [[id number]]  Certification:: I certify this patient will need inpatient services for at least 2 midnights  Estimated Length of Stay: 3          B Medical/Surgery History Past Medical History:  Diagnosis Date   AAA (abdominal aortic aneurysm) ([place])    COPD (chronic obstructive pulmonary disease) ([place])    Coronary artery disease 2000   Status post CABG   Dyslipidemia    HFrEF (heart failure with reduced  ejection fraction) ([place])    History of GI bleed    History of kidney stones    Hypertension    Obesity    Osteoarthritis    PAF (paroxysmal atrial fibrillation) ([place])    Prostate cancer ([place])    Sleep apnea    USING CPAP   Spinal stenosis    SSS (sick sinus syndrome) ([place]) 2000   s/p PPM   Type 2 diabetes mellitus ([place])    Past Surgical History:  Procedure Laterality Date   BIOPSY PROSTATE  01/09/2018   CATARACT EXTRACTION Left    CORONARY ARTERY BYPASS GRAFT     2000   ESOPHAGOGASTRODUODENOSCOPY N/A 10/24/2018   Procedure: ESOPHAGOGASTRODUODENOSCOPY (EGD);  Surgeon: Rogene Houston, MD;  [facility];  Service: Endoscopy;  Laterality: N/A;   ESOPHAGOGASTRODUODENOSCOPY N/A 03/19/2019   Procedure: ESOPHAGOGASTRODUODENOSCOPY (EGD);  Surgeon: Rogene Houston, MD;  Location[facility];  Service: Endoscopy;  Laterality: N/A;  29   JOINT REPLACEMENT     L hip Dr. Wynelle Link 07-18-17   L5-S1 Gordy Levan decompressive laminectomy     PACEMAKER INSERTION     MDT implanted for sick sinus syndrome   PERMANENT PACEMAKER GENERATOR CHANGE N/A 03/30/2014   MDT Adapta L generator change by Dr Rayann Heman   RADIOACTIVE SEED IMPLANT N/A 06/10/2018   Procedure: RADIOACTIVE SEED IMPLANT/BRACHYTHERAPY IMPLANT;  Surgeon: Cleon Gustin, MD;  Location: [facility] ORS;  Service: Urology;  Laterality: N/A;   REPLACEMENT TOTAL KNEE BILATERAL     BIL   RIGHT HEART CATH N/A 11/28/2019   Procedure: RIGHT HEART CATH;  Surgeon: Jolaine Artist, MD;  Location:  [facility]VASIVE CV LAB;  Service: Cardiovascular;  Laterality: N/A;   Right total hip arthroplasty.     SPACE OAR INSTILLATION N/A 06/10/2018   Procedure: SPACE OAR INSTILLATION;  Surgeon: Cleon Gustin, MD;  Location: [facility];  Service: Urology;  Laterality: N/A;   TOTAL HIP ARTHROPLASTY Left 07/18/2017   Procedure: LEFT TOTAL HIP ARTHROPLASTY ANTERIOR APPROACH;  Surgeon: [name], MD;  Location: [facility];  Service: Orthopedics;  Laterality:  Left;   TRANSRECTAL ULTRASOUND  01/09/2018     A IV Location/Drains/Wounds Patient Lines/Drains/Airways Status     Active Line/Drains/Airways     Name Placement date Placement time Site Days   Peripheral IV 08/01/22 20 G Right Antecubital 08/01/22  0033  Antecubital  1   Peripheral IV 08/01/22 20 G Anterior;Left Forearm 08/01/22  1256  Forearm  1   Pressure Injury 10/23/18 Stage II -  Partial thickness loss of dermis presenting as a shallow open ulcer with a red, pink wound bed without slough. 10/23/18  1230  -- 1379            Intake/Output Last 24 hours  Intake/Output Summary (Last 24 hours) at 08/02/2022 1817 Last data filed at 08/02/2022 1246 Gross per 24 hour  Intake --  Output 250 ml  Net -250 ml    Labs/Imaging Results for orders placed or performed during the hospital encounter of 08/01/22 (from the past 48 hour(s))  CBC with Differential     Status: Abnormal   Collection Time: 08/01/22 12:30 AM  Result Value Ref Range   WBC 11.1 (H) 4.0 - 10.5 K/uL   RBC 5.12 4.22 - 5.81 MIL/uL   Hemoglobin 14.3 13.0 - 17.0 g/dL   HCT 44.3 39.0 - 52.0 %   MCV 86.5 80.0 - 100.0 fL   MCH 27.9 26.0 - 34.0 pg   MCHC 32.3 30.0 - 36.0 g/dL   RDW 15.6 (H) 11.5 - 15.5 %   Platelets 140 (L) 150 - 400 K/uL   nRBC 0.0 0.0 - 0.2 %   Neutrophils Relative % 89 %   Neutro Abs 9.9 (H) 1.7 - 7.7 K/uL   Lymphocytes Relative 3 %   Lymphs Abs 0.4 (L) 0.7 - 4.0 K/uL   Monocytes Relative 7 %   Monocytes Absolute 0.7 0.1 - 1.0 K/uL   Eosinophils Relative 0 %   Eosinophils Absolute 0.0 0.0 - 0.5 K/uL   Basophils Relative 0 %   Basophils Absolute 0.0 0.0 - 0.1 K/uL   Immature Granulocytes 1 %   Abs Immature Granulocytes 0.06 0.00 - 0.07 K/uL    Comment: Performed at [facility], [address] [place], [place] [address]  Comprehensive metabolic panel     Status: Abnormal   Collection Time: 08/01/22 12:30 AM  Result Value Ref Range   Sodium 131 (L) 135 - 145 mmol/L   Potassium 4.0 3.5 - 5.1  mmol/L   Chloride 96 (L) 98 - 111 mmol/L   CO2 22 22 - 32 mmol/L   Glucose, Bld 60 (L) 70 - 99 mg/dL    Comment: Glucose reference range applies only to samples taken after fasting for at least 8 hours.   BUN 57 (H) 8 - 23 mg/dL   Creatinine, Ser 3.08 (H) 0.61 - 1.24 mg/dL   Calcium 8.7 (L) 8.9 - 10.3 mg/dL   Total Protein 7.5 6.5 - 8.1 g/dL   Albumin 3.4 (L) 3.5 - 5.0 g/dL   AST 69 (H) 15 - 41 U/L  ALT 19 0 - 44 U/L   Alkaline Phosphatase 96 38 - 126 U/L   Total Bilirubin 1.8 (H) 0.3 - 1.2 mg/dL   GFR, Estimated 20 (L) >60 mL/min    Comment: (NOTE) Calculated using the CKD-EPI Creatinine Equation (2021)    Anion gap 13 5 - 15    Comment: Performed at [facility], [address] [place], [place] [address]  Troponin I (High Sensitivity)     Status: Abnormal   Collection Time: 08/01/22 12:30 AM  Result Value Ref Range   Troponin I (High Sensitivity) 10,232 (HH) <18 ng/L    Comment: CRITICAL RESULT CALLED TO, READ BACK BY AND VERIFIED WITH: K.NICHLOAS AT 0114 ON 01.02.24 BY ADGER J  (NOTE) Elevated high sensitivity troponin I (hsTnI) values and significant  changes across serial measurements may suggest ACS but many other  chronic and acute conditions are known to elevate hsTnI results.  Refer to the Links section for chest pain algorithms and additional  guidance. Performed at [facility], [address] [place], [place] [address]   Brain natriuretic peptide     Status: Abnormal   Collection Time: 08/01/22 12:30 AM  Result Value Ref Range   B Natriuretic Peptide 1,428.0 (H) 0.0 - 100.0 pg/mL    Comment: Performed at [facility], [address], [place], [place] [address]  Lactic acid, plasma     Status: Abnormal   Collection Time: 08/01/22 12:30 AM  Result Value Ref Range   Lactic Acid, Venous 2.7 (HH) 0.5 - 1.9 mmol/L    Comment: CRITICAL RESULT CALLED TO, READ BACK BY AND VERIFIED WITH: K.NICHOLAS AT 0105 ON 01.02.24 BY ADGER J  Performed at [facility], [address] [place], [place] [address]   Type and screen     Status: None   Collection Time: 08/01/22  1:29 AM  Result Value Ref Range   ABO/RH(D) O POS    Antibody Screen NEG    Sample Expiration      08/04/2022,2359 Performed at [facility], [address], [place], [place] [address]   Lactic acid, plasma     Status: Abnormal   Collection Time: 08/01/22  1:31 AM  Result Value Ref Range   Lactic Acid, Venous 2.2 (HH) 0.5 - 1.9 mmol/L    Comment: CRITICAL RESULT CALLED TO, READ BACK BY AND VERIFIED WITH: K.NICHLOAS AT 0208 ON 01.02.24 BY ADGER J  Performed at [facility], [address] [place],  [address]   Resp panel by RT-PCR (RSV, Flu A&B, Covid) Anterior Nasal Swab     Status: Abnormal   Collection Time: 08/01/22  1:53 AM   Specimen: Anterior Nasal Swab  Result Value Ref Range   SARS Coronavirus 2 by RT PCR POSITIVE (A) NEGATIVE    Comment: (NOTE) SARS-CoV-2 target nucleic acids are DETECTED.  The SARS-CoV-2 RNA is generally detectable in upper respiratory specimens during the acute phase of infection. Positive results are indicative of the presence of the identified virus, but do not rule out bacterial infection or co-infection with other pathogens not detected by the test. Clinical correlation with patient history and other diagnostic information is necessary to determine patient infection status. The expected result is Negative.  Fact Sheet for Patients: EntrepreneurPulse.com.au  Fact Sheet for Healthcare Providers: IncredibleEmployment.be  This test is not yet approved or cleared by the [place] FDA and  has been authorized for detection and/or diagnosis of SARS-CoV-2 by FDA under an Emergency Use Authorization (EUA).  This EUA  will remain in effect (meaning this test can be used) for the duration of  the COVID-19 declaration under Section 564(b)(1) of the A ct, 21 U.S.C. section 360bbb-3(b)(1), unless the authorization  is terminated or revoked sooner.     Influenza A by PCR NEGATIVE NEGATIVE   Influenza B by PCR NEGATIVE NEGATIVE    Comment: (NOTE) The Xpert Xpress SARS-CoV-2/FLU/RSV plus assay is intended as an aid in the diagnosis of influenza from Nasopharyngeal swab specimens and should not be used as a sole basis for treatment. Nasal washings and aspirates are unacceptable for Xpert Xpress SARS-CoV-2/FLU/RSV testing.  Fact Sheet for Patients: EntrepreneurPulse.com.au  Fact Sheet for Healthcare Providers: IncredibleEmployment.be  This test is not yet approved or cleared by the [place] FDA and has been authorized for detection and/or diagnosis of SARS-CoV-2 by FDA under an Emergency Use Authorization (EUA). This EUA will remain in effect (meaning this test can be used) for the duration of the COVID-19 declaration under Section 564(b)(1) of the Act, 21 U.S.C. section 360bbb-3(b)(1), unless the authorization is terminated or revoked.     Resp Syncytial Virus by PCR NEGATIVE NEGATIVE    Comment: (NOTE) Fact Sheet for Patients: EntrepreneurPulse.com.au  Fact Sheet for Healthcare Providers: IncredibleEmployment.be  This test is not yet approved or cleared by the [place] FDA and has been authorized for detection and/or diagnosis of SARS-CoV-2 by FDA under an Emergency Use Authorization (EUA). This EUA will remain in effect (meaning this test can be used) for the duration of the COVID-19 declaration under Section 564(b)(1) of the Act, 21 U.S.C. section 360bbb-3(b)(1), unless the authorization is terminated or revoked.  Performed at [facility], [address], [place], [place] [address]   Troponin I (High Sensitivity)     Status: Abnormal   Collection Time: 08/01/22  2:42 AM  Result Value Ref Range   Troponin I (High Sensitivity) 9,832 (HH) <18 ng/L    Comment: DELTA CHECK NOTED CRITICAL RESULT CALLED  TO, READ BACK BY AND VERIFIED WITH: R.THOMPSON AT 2831 ON 01.02.23 BY ADGER J  (NOTE) Elevated high sensitivity troponin I (hsTnI) values and significant  changes across serial measurements may suggest ACS but many other  chronic and acute conditions are known to elevate hsTnI results.  Refer to the Links section for chest pain algorithms and additional  guidance. Performed at [facility], [address] [place], [place] [address]   Heparin level (unfractionated)     Status: None   Collection Time: 08/01/22  8:43 AM  Result Value Ref Range   Heparin Unfractionated 0.51 0.30 - 0.70 IU/mL    Comment: (NOTE) The clinical reportable range upper limit is being lowered to >1.10 to align with the FDA approved guidance for the current laboratory assay.  If heparin results are below expected values, and patient dosage has  been confirmed, suggest follow up testing of antithrombin III levels. Performed at [facility], [address], [place], [place] [address]   Lactic acid, plasma     Status: None   Collection Time: 08/01/22  8:43 AM  Result Value Ref Range   Lactic Acid, Venous 1.0 0.5 - 1.9 mmol/L    Comment: Performed at [facility], [address], [place], [place] [address]  Troponin I (High Sensitivity)     Status: Abnormal   Collection Time: 08/01/22  8:43 AM  Result Value Ref Range   Troponin I (High Sensitivity) 8,473 (HH) <18 ng/L    Comment: CRITICAL VALUE NOTED.  VALUE IS CONSISTENT  WITH PREVIOUSLY REPORTED AND CALLED VALUE. (NOTE) Elevated high sensitivity troponin I (hsTnI) values and significant  changes across serial measurements may suggest ACS but many other  chronic and acute conditions are known to elevate hsTnI results.  Refer to the Links section for chest pain algorithms and additional  guidance. Performed at [facility], [address], [place], [place] [address]   CBG monitoring, ED     Status: Abnormal   Collection Time: 08/01/22 11:55 AM  Result Value Ref  Range   Glucose-Capillary 68 (L) 70 - 99 mg/dL    Comment: Glucose reference range applies only to samples taken after fasting for at least 8 hours.   Comment 1 Notify RN    Comment 2 Document in Chart   CBG monitoring, ED     Status: Abnormal   Collection Time: 08/01/22  3:27 PM  Result Value Ref Range   Glucose-Capillary 100 (H) 70 - 99 mg/dL    Comment: Glucose reference range applies only to samples taken after fasting for at least 8 hours.   Comment 1 Notify RN    Comment 2 Document in Chart   CBG monitoring, ED     Status: Abnormal   Collection Time: 08/01/22  3:54 PM  Result Value Ref Range   Glucose-Capillary 107 (H) 70 - 99 mg/dL    Comment: Glucose reference range applies only to samples taken after fasting for at least 8 hours.  Heparin level (unfractionated)     Status: Abnormal   Collection Time: 08/01/22  4:40 PM  Result Value Ref Range   Heparin Unfractionated 0.24 (L) 0.30 - 0.70 IU/mL    Comment: CALLED TO Gwenith Daily, RN @ [phone] 08/01/22  BY SEKDAHL FIRST SAMPLE RAN ON SAVE SPECIMEN FROM 1319 08/01/22 (NOTE) The clinical reportable range upper limit is being lowered to >1.10 to align with the FDA approved guidance for the current laboratory assay.  If heparin results are below expected values, and patient dosage has  been confirmed, suggest follow up testing of antithrombin III levels. Performed at [facility], [place] [address], [place], [place] [address] CORRECTED ON 01/02 AT 1821: PREVIOUSLY REPORTED AS 0.43   CBG monitoring, ED     Status: Abnormal   Collection Time: 08/02/22 12:26 AM  Result Value Ref Range   Glucose-Capillary 158 (H) 70 - 99 mg/dL    Comment: Glucose reference range applies only to samples taken after fasting for at least 8 hours.  Heparin level (unfractionated)     Status: Abnormal   Collection Time: 08/02/22  2:50 AM  Result Value Ref Range   Heparin Unfractionated 0.19 (L) 0.30 - 0.70 IU/mL    Comment: (NOTE) The clinical  reportable range upper limit is being lowered to >1.10 to align with the FDA approved guidance for the current laboratory assay.  If heparin results are below expected values, and patient dosage has  been confirmed, suggest follow up testing of antithrombin III levels. Performed at [facility], [place] [address] [place], [place] [address]   CBC with Differential/Platelet     Status: Abnormal   Collection Time: 08/02/22  2:54 AM  Result Value Ref Range   WBC 6.1 4.0 - 10.5 K/uL   RBC 4.22 4.22 - 5.81 MIL/uL   Hemoglobin 11.7 (L) 13.0 - 17.0 g/dL   HCT 36.9 (L) 39.0 - 52.0 %   MCV 87.4 80.0 - 100.0 fL   MCH 27.7 26.0 - 34.0 pg   MCHC 31.7  30.0 - 36.0 g/dL   RDW 15.5 11.5 - 15.5 %   Platelets 118 (L) 150 - 400 K/uL   nRBC 0.0 0.0 - 0.2 %   Neutrophils Relative % 79 %   Neutro Abs 4.8 1.7 - 7.7 K/uL   Lymphocytes Relative 15 %   Lymphs Abs 0.9 0.7 - 4.0 K/uL   Monocytes Relative 5 %   Monocytes Absolute 0.3 0.1 - 1.0 K/uL   Eosinophils Relative 1 %   Eosinophils Absolute 0.0 0.0 - 0.5 K/uL   Basophils Relative 0 %   Basophils Absolute 0.0 0.0 - 0.1 K/uL   Immature Granulocytes 0 %   Abs Immature Granulocytes 0.02 0.00 - 0.07 K/uL    Comment: Performed at [facility] [address], [place], [place] [address]  Comprehensive metabolic panel     Status: Abnormal   Collection Time: 08/02/22  2:54 AM  Result Value Ref Range   Sodium 130 (L) 135 - 145 mmol/L   Potassium 3.6 3.5 - 5.1 mmol/L   Chloride 99 98 - 111 mmol/L   CO2 20 (L) 22 - 32 mmol/L   Glucose, Bld 142 (H) 70 - 99 mg/dL    Comment: Glucose reference range applies only to samples taken after fasting for at least 8 hours.   BUN 58 (H) 8 - 23 mg/dL   Creatinine, Ser 2.39 (H) 0.61 - 1.24 mg/dL   Calcium 8.0 (L) 8.9 - 10.3 mg/dL   Total Protein 5.9 (L) 6.5 - 8.1 g/dL   Albumin 2.6 (L) 3.5 - 5.0 g/dL   AST 132 (H) 15 - 41 U/L   ALT 31 0 - 44 U/L   Alkaline Phosphatase 74 38 - 126 U/L   Total Bilirubin  1.1 0.3 - 1.2 mg/dL   GFR, Estimated 27 (L) >60 mL/min    Comment: (NOTE) Calculated using the CKD-EPI Creatinine Equation (2021)    Anion gap 11 5 - 15    Comment: Performed at [facility], [place] [address], [place], [place] [address]  C-reactive protein     Status: Abnormal   Collection Time: 08/02/22  2:54 AM  Result Value Ref Range   CRP 13.5 (H) <1.0 mg/dL    Comment: Performed at [facility] [address], [place], [place] [address]  D-dimer, quantitative     Status: Abnormal   Collection Time: 08/02/22  2:54 AM  Result Value Ref Range   D-Dimer, Quant 2.40 (H) 0.00 - 0.50 ug/mL-FEU    Comment: (NOTE) At the manufacturer cut-off value of 0.5 g/mL FEU, this assay has a negative predictive value of 95-100%.This assay is intended for use in conjunction with a clinical pretest probability (PTP) assessment model to exclude pulmonary embolism (PE) and deep venous thrombosis (DVT) in outpatients suspected of PE or DVT. Results should be correlated with clinical presentation. Performed at [facility], [place] [address], [place], [place] [address]   Ferritin     Status: None   Collection Time: 08/02/22  2:54 AM  Result Value Ref Range   Ferritin 147 24 - 336 ng/mL    Comment: Performed at [facility] [address] [place], [place] [address]  Magnesium     Status: None   Collection Time: 08/02/22  2:54 AM  Result Value Ref Range   Magnesium 1.9 1.7 - 2.4 mg/dL    Comment: Performed at [facility] [address] [place],  [address]  Phosphorus     Status: None   Collection Time: 08/02/22  2:54 AM  Result Value Ref Range   Phosphorus 3.7 2.5 - 4.6 mg/dL    Comment: Performed at [facility], [place] [address], [place], [place] [address]  CBG monitoring, ED     Status: Abnormal   Collection Time: 08/02/22  5:04 AM  Result Value Ref Range   Glucose-Capillary 128 (H) 70 - 99 mg/dL    Comment: Glucose reference range applies only to  samples taken after fasting for at least 8 hours.  CBG monitoring, ED     Status: Abnormal   Collection Time: 08/02/22  8:28 AM  Result Value Ref Range   Glucose-Capillary 132 (H) 70 - 99 mg/dL    Comment: Glucose reference range applies only to samples taken after fasting for at least 8 hours.  CBG monitoring, ED     Status: Abnormal   Collection Time: 08/02/22 11:44 AM  Result Value Ref Range   Glucose-Capillary 195 (H) 70 - 99 mg/dL    Comment: Glucose reference range applies only to samples taken after fasting for at least 8 hours.  Heparin level (unfractionated)     Status: None   Collection Time: 08/02/22 12:02 PM  Result Value Ref Range   Heparin Unfractionated 0.32 0.30 - 0.70 IU/mL    Comment: (NOTE) The clinical reportable range upper limit is being lowered to >1.10 to align with the FDA approved guidance for the current laboratory assay.  If heparin results are below expected values, and patient dosage has  been confirmed, suggest follow up testing of antithrombin III levels. Performed at [facility], [place] [address], [place], [place] [address]   CBG monitoring, ED     Status: Abnormal   Collection Time: 08/02/22  4:21 PM  Result Value Ref Range   Glucose-Capillary 141 (H) 70 - 99 mg/dL    Comment: Glucose reference range applies only to samples taken after fasting for at least 8 hours.   ECHOCARDIOGRAM COMPLETE  Result Date: 08/01/2022    ECHOCARDIOGRAM REPORT   Patient Name:   [name] Date of Exam: 08/01/2022 Medical Rec #:  [id number]       Height:       65.0 in Accession #:    [id number]      Weight:       197.3 lb Date of Birth:  11-28-1940        BSA:          1.967 m Patient Age:    60 years        BP:           110/80 mmHg Patient Gender: M               HR:           89 bpm. Exam Location:  [facility] Procedure: 2D Echo, Cardiac Doppler and Color Doppler Indications:    CHF-Acute Diastolic N05.39  History:        Patient has prior history of  Echocardiogram examinations, most                 recent 11/25/2019. Previous Myocardial Infarction and CAD,                 Pacemaker, COPD, Arrythmias:Atrial Fibrillation; Risk                 Factors:Hypertension, Diabetes, Dyslipidemia and Former Smoker.  COVID-19 virus infection, SSS (sick sinus syndrome) ([place]), HFrEF                 (heart failure with reduced ejection fraction) ([facility]) (From Hx).  Sonographer:    Alvino Chapel RCS Referring Phys: [id number] OLADAPO ADEFESO IMPRESSIONS  1. Left ventricular ejection fraction, by estimation, is approximately 25%. The left ventricle has severely decreased function. The left ventricle demonstrates regional wall motion abnormalities (see scoring diagram/findings for description). There is moderate asymmetric left ventricular hypertrophy of the basal segment. Left ventricular diastolic parameters are indeterminate.  2. Right ventricular systolic function is moderately reduced. The right ventricular size is normal. There is mildly elevated pulmonary artery systolic pressure. The estimated right ventricular systolic pressure is 70.1 mmHg.  3. Left atrial size was severely dilated.  4. Right atrial size was severely dilated.  5. The mitral valve is grossly normal. Moderate mitral valve regurgitation.  6. The aortic valve is tricuspid. Aortic valve regurgitation is not visualized. Aortic valve sclerosis/calcification is present, without any evidence of aortic stenosis.  7. The inferior vena cava is dilated in size with <50% respiratory variability, suggesting right atrial pressure of 15 mmHg. Comparison(s): Prior images reviewed side by side. LVEF has decreased in comparison, approximately 25% at this point and with wall motion abnormalities suggestive of ischemic cardiomyopathy. Moderate mitral regurgitation. Moderate RV dysfunction. FINDINGS  Left Ventricle: Left ventricular ejection fraction, by estimation, is 25%. The left ventricle has severely decreased  function. The left ventricle demonstrates regional wall motion abnormalities. The left ventricular internal cavity size was normal in size. There is moderate asymmetric left ventricular hypertrophy of the basal segment. Left ventricular diastolic function could not be evaluated due to atrial fibrillation. Left ventricular diastolic parameters are indeterminate.  LV Wall Scoring: The antero-lateral wall, entire inferior wall, and posterior wall are akinetic. The anterior wall, mid inferoseptal segment, and basal inferoseptal segment are hypokinetic. The entire anterior septum, apical lateral segment, apical anterior segment, and apex are normal. Right Ventricle: The right ventricular size is normal. No increase in right ventricular wall thickness. Right ventricular systolic function is moderately reduced. There is mildly elevated pulmonary artery systolic pressure. The tricuspid regurgitant velocity is 2.30 m/s, and with an assumed right atrial pressure of 15 mmHg, the estimated right ventricular systolic pressure is 77.9 mmHg. Left Atrium: Left atrial size was severely dilated. Right Atrium: Right atrial size was severely dilated. Pericardium: There is no evidence of pericardial effusion. Mitral Valve: The mitral valve is grossly normal. Moderate mitral valve regurgitation, with centrally-directed jet. Tricuspid Valve: The tricuspid valve is grossly normal. Tricuspid valve regurgitation is mild. Aortic Valve: The aortic valve is tricuspid. Aortic valve regurgitation is not visualized. Aortic valve sclerosis/calcification is present, without any evidence of aortic stenosis. Pulmonic Valve: The pulmonic valve was grossly normal. Pulmonic valve regurgitation is trivial. Aorta: The aortic root is normal in size and structure. Venous: The inferior vena cava is dilated in size with less than 50% respiratory variability, suggesting right atrial pressure of 15 mmHg. IAS/Shunts: No atrial level shunt detected by color flow  Doppler. Additional Comments: A device lead is visualized.  LEFT VENTRICLE PLAX 2D LVIDd:         5.40 cm LVIDs:         4.80 cm LV PW:         1.20 cm LV IVS:        1.60 cm LVOT diam:     1.90 cm LV SV:  32 LV SV Index:   16 LVOT Area:     2.84 cm  LV Volumes (MOD) LV vol d, MOD A2C: 176.0 ml LV vol d, MOD A4C: 206.0 ml LV vol s, MOD A2C: 127.0 ml LV vol s, MOD A4C: 145.0 ml LV SV MOD A2C:     49.0 ml LV SV MOD A4C:     206.0 ml LV SV MOD BP:      59.7 ml RIGHT VENTRICLE TAPSE (M-mode): 2.0 cm LEFT ATRIUM              Index        RIGHT ATRIUM           Index LA diam:        4.60 cm  2.34 cm/m   RA Area:     31.90 cm LA Vol (A2C):   90.0 ml  45.76 ml/m  RA Volume:   119.00 ml 60.50 ml/m LA Vol (A4C):   101.0 ml 51.35 ml/m LA Biplane Vol: 97.2 ml  49.42 ml/m  AORTIC VALVE LVOT Vmax:   71.13 cm/s LVOT Vmean:  45.867 cm/s LVOT VTI:    0.114 m  AORTA Ao Root diam: 3.70 cm MITRAL VALVE                  TRICUSPID VALVE MV Area (PHT): 5.54 cm       TR Peak grad:   21.2 mmHg MV Decel Time: 137 msec       TR Vmax:        230.00 cm/s MR Peak grad:    90.6 mmHg MR Mean grad:    54.0 mmHg    SHUNTS MR Vmax:         476.00 cm/s  Systemic VTI:  0.11 m MR Vmean:        340.0 cm/s   Systemic Diam: 1.90 cm MR PISA:         2.26 cm MR PISA Eff ROA: 15 mm MR PISA Radius:  0.60 cm MV E velocity: 110.00 cm/s Rozann Lesches MD Electronically signed by Rozann Lesches MD Signature Date/Time: 08/01/2022/12:27:39 PM    Final    US Carotid Bilateral  Result Date: 08/01/2022 CLINICAL DATA:  82 year old male with a history of syncope EXAM: BILATERAL CAROTID DUPLEX ULTRASOUND TECHNIQUE: Pearline Cables scale imaging, color Doppler and duplex ultrasound were performed of bilateral carotid and vertebral arteries in the neck. COMPARISON:  None Available. FINDINGS: Criteria: Quantification of carotid stenosis is based on velocity parameters that correlate the residual internal carotid diameter with NASCET-based stenosis levels, using the  diameter of the distal internal carotid lumen as the denominator for stenosis measurement. The following velocity measurements were obtained: RIGHT ICA:  Systolic 87 cm/sec, Diastolic 30 cm/sec CCA:  90 cm/sec SYSTOLIC ICA/CCA RATIO:  1.0 ECA:  71 cm/sec LEFT ICA:  Systolic 962 cm/sec, Diastolic 35 cm/sec CCA:  73 cm/sec SYSTOLIC ICA/CCA RATIO:  1.4 ECA:  101 cm/sec Right Brachial SBP: Not acquired Left Brachial SBP: Not acquired RIGHT CAROTID ARTERY: No significant calcifications of the right common carotid artery. Intermediate waveform maintained. Moderate heterogeneous and partially calcified plaque at the right carotid bifurcation. Lumen shadowing. Low resistance waveform of the right ICA. No significant tortuosity. RIGHT VERTEBRAL ARTERY: No flow identified in the right vertebral artery. LEFT CAROTID ARTERY: No significant calcifications of the left common carotid artery. Intermediate waveform maintained. Moderate heterogeneous and partially calcified plaque at the left carotid bifurcation. Lumen shadowing. Low resistance waveform of the left  ICA. No significant tortuosity. LEFT VERTEBRAL ARTERY:  Antegrade flow with low resistance waveform. IMPRESSION: Color duplex indicates moderate heterogeneous and calcified plaque, with no hemodynamically significant stenosis by duplex criteria in the extracranial cerebrovascular circulation. No flow identified in the right vertebral artery. Signed, Dulcy Fanny. Nadene Rubins, RPVI [facility] Electronically Signed   By: Corrie Mckusick D.O.   On: 08/01/2022 09:54   CT Head Wo Contrast  Result Date: 08/01/2022 CLINICAL DATA:  Fall EXAM: CT HEAD WITHOUT CONTRAST TECHNIQUE: Contiguous axial images were obtained from the base of the skull through the vertex without intravenous contrast. RADIATION DOSE REDUCTION: This exam was performed according to the departmental dose-optimization program which includes automated  exposure control, adjustment of the mA and/or kV according to patient size and/or use of iterative reconstruction technique. COMPARISON:  None Available. FINDINGS: Brain: There is no mass, hemorrhage or extra-axial collection. There is periventricular hypoattenuation compatible with chronic microvascular disease. Vascular: Atherosclerotic calcification of the basilar artery and internal carotid arteries. Skull: Normal Sinuses/Orbits: Paranasal sinuses are clear.  Normal orbits. Other: None IMPRESSION: 1. No acute intracranial abnormality. 2. Chronic microvascular disease. Electronically Signed   By: Ulyses Jarred M.D.   On: 08/01/2022 01:27   DG Chest 2 View  Result Date: 08/01/2022 CLINICAL DATA:  Near syncope. EXAM: CHEST - 2 VIEW COMPARISON:  November 28, 2019 FINDINGS: There is a dual lead AICD. Multiple sternal wires and vascular clips are noted. The cardiac silhouette is moderately enlarged and unchanged in size. Mild atelectasis is seen within the bilateral lung bases. There is no evidence of a pleural effusion or pneumothorax. Postoperative changes are again seen throughout the mid and lower thoracic spine. IMPRESSION: 1. Stable cardiomegaly with mild bibasilar atelectasis. 2. Evidence of prior median sternotomy/CABG. Electronically Signed   By: Virgina Norfolk M.D.   On: 08/01/2022 01:22    Pending Labs Unresulted Labs (From admission, onward)     Start     Ordered   08/03/22 0500  Hemoglobin A1c  Tomorrow morning,   R        08/02/22 1530   08/02/22 0500  CBC with Differential/Platelet  Daily,   R      08/01/22 0749   08/02/22 0500  Comprehensive metabolic panel  Daily,   R      08/01/22 0749   08/02/22 0500  C-reactive protein  Daily,   R      08/01/22 0749   08/02/22 0500  Magnesium  Daily,   R      08/01/22 0749   08/02/22 0500  Phosphorus  Daily,   R      08/01/22 0749   08/02/22 0500  Heparin level (unfractionated)  Daily,   R      08/01/22 1051             Vitals/Pain Today's Vitals   08/02/22 1603 08/02/22 1615 08/02/22 1635 08/02/22 1637  BP: 122/80     Pulse: (!) 127 (!) 125 (!) 128   Resp:  (!) 27 (!) 24   Temp:    98.4 F (36.9 C)  TempSrc:    Oral  SpO2:  100% 98%   Weight:      Height:      PainSc:        Isolation Precautions Airborne and Contact precautions  Medications Medications  heparin ADULT infusion 100 units/mL (25000 units/239m) (1,200 Units/hr Intravenous Rate/Dose Change 08/02/22 0459)  aspirin EC tablet 81 mg (  81 mg Oral Given 08/02/22 0938)  febuxostat (ULORIC) tablet 40 mg (40 mg Oral Given 08/02/22 0939)  atorvastatin (LIPITOR) tablet 40 mg (40 mg Oral Given 08/01/22 2215)  pantoprazole (PROTONIX) EC tablet 40 mg (40 mg Oral Given 08/02/22 0938)  guaiFENesin-dextromethorphan (ROBITUSSIN DM) 100-10 MG/5ML syrup 10 mL (has no administration in time range)  chlorpheniramine-HYDROcodone (TUSSIONEX) 10-8 MG/5ML suspension 5 mL (has no administration in time range)  ascorbic acid (VITAMIN C) tablet 500 mg (500 mg Oral Given 08/02/22 0939)  zinc sulfate capsule 220 mg (220 mg Oral Given 08/02/22 0939)  acetaminophen (TYLENOL) tablet 650 mg (has no administration in time range)  prochlorperazine (COMPAZINE) injection 10 mg (has no administration in time range)  dextromethorphan-guaiFENesin (MUCINEX DM) 30-600 MG per 12 hr tablet 1 tablet (1 tablet Oral Given 08/02/22 0938)  methylPREDNISolone sodium succinate (SOLU-MEDROL) 125 mg/2 mL injection 60 mg (60 mg Intravenous Given 08/02/22 1601)  ipratropium-albuterol (DUONEB) 0.5-2.5 (3) MG/3ML nebulizer solution 3 mL (3 mLs Nebulization Given 08/02/22 1603)  metoprolol tartrate (LOPRESSOR) tablet 12.5 mg (12.5 mg Oral Given 08/02/22 1603)  albuterol (PROVENTIL) (2.5 MG/3ML) 0.083% nebulizer solution 2.5 mg (has no administration in time range)  lactated ringers bolus 1,000 mL (0 mLs Intravenous Stopped 08/01/22 0336)  aspirin chewable tablet 324 mg (324 mg Oral Given 08/01/22 0153)   heparin bolus via infusion 4,000 Units (4,000 Units Intravenous Bolus from Bag 08/01/22 0152)  lactated ringers bolus 500 mL (0 mLs Intravenous Stopped 08/01/22 0511)  heparin bolus via infusion 2,000 Units (2,000 Units Intravenous Bolus from Bag 08/02/22 0502)    Mobility walks with device Moderate fall risk   Focused Assessments Cardiac Assessment Handoff:  Cardiac Rhythm: Sinus tachycardia Lab Results  Component Value Date   TROPONINI <0.03 10/23/2018   Lab Results  Component Value Date   DDIMER 2.40 (H) 08/02/2022   Does the Patient currently have chest pain? No   , Pulmonary Assessment Handoff:  Lung sounds: Bilateral Breath Sounds: Diminished L Breath Sounds: Expiratory wheezes R Breath Sounds: Expiratory wheezes O2 Device: Nasal Cannula O2 Flow Rate (L/min): 4 L/min    R Recommendations: See Admitting Provider Note  Report given to:   Additional Notes:

## 2022-08-02 NOTE — Progress Notes (Signed)
Progress Note  Patient Name: [name] Date of Encounter: 08/02/2022  Primary Cardiologist:   None   Subjective   He remains SOB but improved slightly.  Denies chest pain.  He does not feel his heart racing.   Inpatient Medications    Scheduled Meds:  albuterol  2 puff Inhalation Q6H   vitamin C  500 mg Oral Daily   aspirin EC  81 mg Oral Daily   atorvastatin  40 mg Oral QHS   dextromethorphan-guaiFENesin  1 tablet Oral BID   febuxostat  40 mg Oral Daily   pantoprazole  40 mg Oral Daily   zinc sulfate  220 mg Oral Daily   Continuous Infusions:  heparin 1,200 Units/hr (08/02/22 0459)   PRN Meds: acetaminophen, chlorpheniramine-HYDROcodone, guaiFENesin-dextromethorphan, prochlorperazine   Vital Signs    Vitals:   08/02/22 0905 08/02/22 0907 08/02/22 0910 08/02/22 0946  BP:    118/80  Pulse:    (!) 117  Resp:    20  Temp:      TempSrc:      SpO2: (!) 79% 90% 99% 99%  Weight:      Height:       No intake or output data in the 24 hours ending 08/02/22 1021 Filed Weights   08/01/22 0019  Weight: 89.5 kg    Telemetry    Atrial fib with rapid rate - Personally Reviewed  ECG    NA - Personally Reviewed  Physical Exam   GEN: No acute distress.   Neck: No  JVD Cardiac: Irregular RR, no murmurs, rubs, or gallops.  Respiratory:    Decreased breath sounds bilaterally.  GI: Soft, nontender, non-distended  MS: No  edema; No deformity. Neuro:  Nonfocal  Psych: Normal affect   Labs    Chemistry Recent Labs  Lab 08/01/22 0030 08/02/22 0254  NA 131* 130*  K 4.0 3.6  CL 96* 99  CO2 22 20*  GLUCOSE 60* 142*  BUN 57* 58*  CREATININE 3.08* 2.39*  CALCIUM 8.7* 8.0*  PROT 7.5 5.9*  ALBUMIN 3.4* 2.6*  AST 69* 132*  ALT 19 31  ALKPHOS 96 74  BILITOT 1.8* 1.1  GFRNONAA 20* 27*  ANIONGAP 13 11     Hematology Recent Labs  Lab 08/01/22 0030 08/02/22 0254  WBC 11.1* 6.1  RBC 5.12 4.22  HGB 14.3 11.7*  HCT 44.3 36.9*  MCV 86.5 87.4  MCH  27.9 27.7  MCHC 32.3 31.7  RDW 15.6* 15.5  PLT 140* 118*    Cardiac EnzymesNo results for input(s): "TROPONINI" in the last 168 hours. No results for input(s): "TROPIPOC" in the last 168 hours.   BNP Recent Labs  Lab 08/01/22 0030  BNP 1,428.0*     DDimer  Recent Labs  Lab 08/02/22 0254  DDIMER 2.40*     Radiology    ECHOCARDIOGRAM COMPLETE  Result Date: 08/01/2022    ECHOCARDIOGRAM REPORT   Patient Name:   [name] Date of Exam: 08/01/2022 Medical Rec #:  [id number]       Height:       65.0 in Accession #:    [id number]      Weight:       197.3 lb Date of Birth:  19-Aug-1940        BSA:          1.967 m Patient Age:    82 years        BP:  110/80 mmHg Patient Gender: M               HR:           89 bpm. Exam Location:  [facility] Procedure: 2D Echo, Cardiac Doppler and Color Doppler Indications:    CHF-Acute Diastolic M01.02  History:        Patient has prior history of Echocardiogram examinations, most                 recent 11/25/2019. Previous Myocardial Infarction and CAD,                 Pacemaker, COPD, Arrythmias:Atrial Fibrillation; Risk                 Factors:Hypertension, Diabetes, Dyslipidemia and Former Smoker.                 COVID-19 virus infection, SSS (sick sinus syndrome) ([place]), HFrEF                 (heart failure with reduced ejection fraction) (HCC) (From Hx).  Sonographer:    Alvino Chapel RCS Referring Phys: [id number] OLADAPO ADEFESO IMPRESSIONS  1. Left ventricular ejection fraction, by estimation, is approximately 25%. The left ventricle has severely decreased function. The left ventricle demonstrates regional wall motion abnormalities (see scoring diagram/findings for description). There is moderate asymmetric left ventricular hypertrophy of the basal segment. Left ventricular diastolic parameters are indeterminate.  2. Right ventricular systolic function is moderately reduced. The right ventricular size is normal. There is mildly elevated pulmonary  artery systolic pressure. The estimated right ventricular systolic pressure is 40.3 mmHg.  3. Left atrial size was severely dilated.  4. Right atrial size was severely dilated.  5. The mitral valve is grossly normal. Moderate mitral valve regurgitation.  6. The aortic valve is tricuspid. Aortic valve regurgitation is not visualized. Aortic valve sclerosis/calcification is present, without any evidence of aortic stenosis.  7. The inferior vena cava is dilated in size with <50% respiratory variability, suggesting right atrial pressure of 15 mmHg. Comparison(s): Prior images reviewed side by side. LVEF has decreased in comparison, approximately 25% at this point and with wall motion abnormalities suggestive of ischemic cardiomyopathy. Moderate mitral regurgitation. Moderate RV dysfunction. FINDINGS  Left Ventricle: Left ventricular ejection fraction, by estimation, is 25%. The left ventricle has severely decreased function. The left ventricle demonstrates regional wall motion abnormalities. The left ventricular internal cavity size was normal in size. There is moderate asymmetric left ventricular hypertrophy of the basal segment. Left ventricular diastolic function could not be evaluated due to atrial fibrillation. Left ventricular diastolic parameters are indeterminate.  LV Wall Scoring: The antero-lateral wall, entire inferior wall, and posterior wall are akinetic. The anterior wall, mid inferoseptal segment, and basal inferoseptal segment are hypokinetic. The entire anterior septum, apical lateral segment, apical anterior segment, and apex are normal. Right Ventricle: The right ventricular size is normal. No increase in right ventricular wall thickness. Right ventricular systolic function is moderately reduced. There is mildly elevated pulmonary artery systolic pressure. The tricuspid regurgitant velocity is 2.30 m/s, and with an assumed right atrial pressure of 15 mmHg, the estimated right ventricular systolic  pressure is 47.4 mmHg. Left Atrium: Left atrial size was severely dilated. Right Atrium: Right atrial size was severely dilated. Pericardium: There is no evidence of pericardial effusion. Mitral Valve: The mitral valve is grossly normal. Moderate mitral valve regurgitation, with centrally-directed jet. Tricuspid Valve: The tricuspid valve is grossly normal. Tricuspid valve regurgitation is  mild. Aortic Valve: The aortic valve is tricuspid. Aortic valve regurgitation is not visualized. Aortic valve sclerosis/calcification is present, without any evidence of aortic stenosis. Pulmonic Valve: The pulmonic valve was grossly normal. Pulmonic valve regurgitation is trivial. Aorta: The aortic root is normal in size and structure. Venous: The inferior vena cava is dilated in size with less than 50% respiratory variability, suggesting right atrial pressure of 15 mmHg. IAS/Shunts: No atrial level shunt detected by color flow Doppler. Additional Comments: A device lead is visualized.  LEFT VENTRICLE PLAX 2D LVIDd:         5.40 cm LVIDs:         4.80 cm LV PW:         1.20 cm LV IVS:        1.60 cm LVOT diam:     1.90 cm LV SV:         32 LV SV Index:   16 LVOT Area:     2.84 cm  LV Volumes (MOD) LV vol d, MOD A2C: 176.0 ml LV vol d, MOD A4C: 206.0 ml LV vol s, MOD A2C: 127.0 ml LV vol s, MOD A4C: 145.0 ml LV SV MOD A2C:     49.0 ml LV SV MOD A4C:     206.0 ml LV SV MOD BP:      59.7 ml RIGHT VENTRICLE TAPSE (M-mode): 2.0 cm LEFT ATRIUM              Index        RIGHT ATRIUM           Index LA diam:        4.60 cm  2.34 cm/m   RA Area:     31.90 cm LA Vol (A2C):   90.0 ml  45.76 ml/m  RA Volume:   119.00 ml 60.50 ml/m LA Vol (A4C):   101.0 ml 51.35 ml/m LA Biplane Vol: 97.2 ml  49.42 ml/m  AORTIC VALVE LVOT Vmax:   71.13 cm/s LVOT Vmean:  45.867 cm/s LVOT VTI:    0.114 m  AORTA Ao Root diam: 3.70 cm MITRAL VALVE                  TRICUSPID VALVE MV Area (PHT): 5.54 cm       TR Peak grad:   21.2 mmHg MV Decel Time: 137  msec       TR Vmax:        230.00 cm/s MR Peak grad:    90.6 mmHg MR Mean grad:    54.0 mmHg    SHUNTS MR Vmax:         476.00 cm/s  Systemic VTI:  0.11 m MR Vmean:        340.0 cm/s   Systemic Diam: 1.90 cm MR PISA:         2.26 cm MR PISA Eff ROA: 15 mm MR PISA Radius:  0.60 cm MV E velocity: 110.00 cm/s Rozann Lesches MD Electronically signed by Rozann Lesches MD Signature Date/Time: 08/01/2022/12:27:39 PM    Final    US Carotid Bilateral  Result Date: 08/01/2022 CLINICAL DATA:  82 year old male with a history of syncope EXAM: BILATERAL CAROTID DUPLEX ULTRASOUND TECHNIQUE: Pearline Cables scale imaging, color Doppler and duplex ultrasound were performed of bilateral carotid and vertebral arteries in the neck. COMPARISON:  None Available. FINDINGS: Criteria: Quantification of carotid stenosis is based on velocity parameters that correlate the residual internal carotid diameter with NASCET-based stenosis levels, using the diameter  of the distal internal carotid lumen as the denominator for stenosis measurement. The following velocity measurements were obtained: RIGHT ICA:  Systolic 87 cm/sec, Diastolic 30 cm/sec CCA:  90 cm/sec SYSTOLIC ICA/CCA RATIO:  1.0 ECA:  71 cm/sec LEFT ICA:  Systolic 716 cm/sec, Diastolic 35 cm/sec CCA:  73 cm/sec SYSTOLIC ICA/CCA RATIO:  1.4 ECA:  101 cm/sec Right Brachial SBP: Not acquired Left Brachial SBP: Not acquired RIGHT CAROTID ARTERY: No significant calcifications of the right common carotid artery. Intermediate waveform maintained. Moderate heterogeneous and partially calcified plaque at the right carotid bifurcation. Lumen shadowing. Low resistance waveform of the right ICA. No significant tortuosity. RIGHT VERTEBRAL ARTERY: No flow identified in the right vertebral artery. LEFT CAROTID ARTERY: No significant calcifications of the left common carotid artery. Intermediate waveform maintained. Moderate heterogeneous and partially calcified plaque at the left carotid bifurcation. Lumen  shadowing. Low resistance waveform of the left ICA. No significant tortuosity. LEFT VERTEBRAL ARTERY:  Antegrade flow with low resistance waveform. IMPRESSION: Color duplex indicates moderate heterogeneous and calcified plaque, with no hemodynamically significant stenosis by duplex criteria in the extracranial cerebrovascular circulation. No flow identified in the right vertebral artery. Signed, Dulcy Fanny. Nadene Rubins, RPVI [facility] Radiology Electronically Signed   By: Corrie Mckusick D.O.   On: 08/01/2022 09:54   CT Head Wo Contrast  Result Date: 08/01/2022 CLINICAL DATA:  Fall EXAM: CT HEAD WITHOUT CONTRAST TECHNIQUE: Contiguous axial images were obtained from the base of the skull through the vertex without intravenous contrast. RADIATION DOSE REDUCTION: This exam was performed according to the departmental dose-optimization program which includes automated exposure control, adjustment of the mA and/or kV according to patient size and/or use of iterative reconstruction technique. COMPARISON:  None Available. FINDINGS: Brain: There is no mass, hemorrhage or extra-axial collection. There is periventricular hypoattenuation compatible with chronic microvascular disease. Vascular: Atherosclerotic calcification of the basilar artery and internal carotid arteries. Skull: Normal Sinuses/Orbits: Paranasal sinuses are clear.  Normal orbits. Other: None IMPRESSION: 1. No acute intracranial abnormality. 2. Chronic microvascular disease. Electronically Signed   By: Ulyses Jarred M.D.   On: 08/01/2022 01:27   DG Chest 2 View  Result Date: 08/01/2022 CLINICAL DATA:  Near syncope. EXAM: CHEST - 2 VIEW COMPARISON:  November 28, 2019 FINDINGS: There is a dual lead AICD. Multiple sternal wires and vascular clips are noted. The cardiac silhouette is moderately enlarged and unchanged in size. Mild atelectasis is seen within the bilateral lung bases. There is no evidence of a  pleural effusion or pneumothorax. Postoperative changes are again seen throughout the mid and lower thoracic spine. IMPRESSION: 1. Stable cardiomegaly with mild bibasilar atelectasis. 2. Evidence of prior median sternotomy/CABG. Electronically Signed   By: Virgina Norfolk M.D.   On: 08/01/2022 01:22    Cardiac Studies   Echo:  1. Left ventricular ejection fraction, by estimation, is approximately  25%. The left ventricle has severely decreased function. The left  ventricle demonstrates regional wall motion abnormalities (see scoring  diagram/findings for description). There is  moderate asymmetric left ventricular hypertrophy of the basal segment.  Left ventricular diastolic parameters are indeterminate.   2. Right ventricular systolic function is moderately reduced. The right  ventricular size is normal. There is mildly elevated pulmonary artery  systolic pressure. The estimated right ventricular systolic pressure is  96.7 mmHg.   3. Left atrial size was severely dilated.   4. Right atrial size was severely dilated.   5. The mitral valve  is grossly normal. Moderate mitral valve  regurgitation.   6. The aortic valve is tricuspid. Aortic valve regurgitation is not  visualized. Aortic valve sclerosis/calcification is present, without any  evidence of aortic stenosis.   7. The inferior vena cava is dilated in size with <50% respiratory  variability, suggesting right atrial pressure of 15 mmHg.    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 Dyann Kief.   Assessment & Plan    NSTEMI:    Trop trending down.   Given baseline creat in the mid 2s I will not be suggesting angiography this admission.   Supportive care for now.  Agree with 48 hours of heparin.  Continue ASA.    CAD/CABG:    As above.  He reports that he has not required any NTG recently and has had no unstable symptoms.   ICM:  EF mildly reduced compared to previous but not unexpected in the setting of atrial fib with RVR.  Elevated pulmonary pressure likely related (Type II).   PAF:    Rate is elevated.  I will add start low dose beta blocker as it looks like his BP has improved.    HTN:  See above.  Hypotension is resolving.     CKD:  Creat is improved.   Today appears to be near baseline.      Hypotension:    BP is improved.     For questions or updates, please contact [place] Please consult [email or web address] for contact info under Cardiology/STEMI.   Signed, Minus Breeding, MD  08/02/2022, 10:21 AM

## 2022-08-02 NOTE — Progress Notes (Signed)
ANTICOAGULATION CONSULT NOTE   Pharmacy Consult for Heparin  Indication: chest pain/ACS  No Known Allergies  Patient Measurements: Height: '5\' 5"'$  (165.1 cm) Weight: 89.5 kg (197 lb 5 oz) IBW/kg (Calculated) : 61.5  Vital Signs: Temp: 99 F (37.2 C) (01/02 2300) Temp Source: Oral (01/02 1849) BP: 113/73 (01/03 0430) Pulse Rate: 109 (01/03 0430)  Labs: Recent Labs    08/01/22 0030 08/01/22 0242 08/01/22 0843 08/01/22 1640 08/02/22 0250 08/02/22 0254  HGB 14.3  --   --   --   --  11.7*  HCT 44.3  --   --   --   --  36.9*  PLT 140*  --   --   --   --  118*  HEPARINUNFRC  --   --  0.51 0.24* 0.19*  --   CREATININE 3.08*  --   --   --   --  2.39*  TROPONINIHS 10,232* 3,734* 8,473*  --   --   --      Estimated Creatinine Clearance: 24.9 mL/min (A) (by C-G formula based on SCr of 2.39 mg/dL (H)).   Medical History: Past Medical History:  Diagnosis Date   AAA (abdominal aortic aneurysm) (HCC)    COPD (chronic obstructive pulmonary disease) (HCC)    Coronary artery disease 2000   Status post CABG   Dyslipidemia    HFrEF (heart failure with reduced ejection fraction) (HCC)    History of GI bleed    History of kidney stones    Hypertension    Obesity    Osteoarthritis    PAF (paroxysmal atrial fibrillation) (HCC)    Prostate cancer (HCC)    Sleep apnea    USING CPAP   Spinal stenosis    SSS (sick sinus syndrome) ([place]) 2000   s/p PPM   Type 2 diabetes mellitus ([place])     Assessment: 82 y/o M with dizziness and weakness for one week. Found to have elevated high sensitivity troponin. Starting heparin. PTA meds reviewed. Labs above reviewed.   1/3 AM update:  Heparin level low  Goal of Therapy:  Heparin level 0.3-0.7 units/ml Monitor platelets by anticoagulation protocol: Yes   Plan:  Heparin 2000 units [employer] heparin to 1200 units/hr 1300 Heparin level Daily CBC/Heparin level Monitor for bleeding  Narda Bonds, PharmD, BCPS Clinical  Pharmacist Phone: [phone]

## 2022-08-02 NOTE — ED Notes (Signed)
Admitting provider Grunz at bedside to evaluate patient.

## 2022-08-02 NOTE — Progress Notes (Signed)
[facility] PROGRESS NOTE  TAYSHAUN KROH (DOB: 03-Jul-1941) EHU:[id number] PCP: Monico Blitz, MD  Brief Narrative: [name] is an 82 y.o. male with a history of CAD s/p 4v CABG (2000), atrial fibrillation, SSS s/p PPM, T2DM, HTN, HLD, AAA, obesity who presented to the ED at [facility] on 08/01/2022 with 5 days of cough and an episode of passing out found to be in rapid atrial fibrillation with hypotension and +covid PCR. AKI (Cr 3.08 from baseline 2.2-2.7) noted with lactic acidosis. No pneumonia on CXR or hypoxemia. CT head nonacute. Troponin elevated to 10,232 for which IV heparin started, aspirin given, and IVF given with some improvement in hypotension. Given his duration of symptoms, no hypoxia nor infiltrate on CXR, antiviral therapy not initiated. The patient was transferred to [facility] with cardiology consulting, deferring angiography with elevated creatinine and no anginal symptoms. IV heparin continues for NSTEMI in the setting of covid-19 infection.  Subjective: Feels diffusely weak but denies significant shortness of breath. No chest pain or palpitations. No fevers. Feels generally better than when he arrived to the ED.  Objective: BP 130/81   Pulse (!) 115   Temp 98.3 F (36.8 C) (Oral)   Resp (!) 25   Ht '5\' 5"'$  (1.651 m)   Wt 89.5 kg   SpO2 98%   BMI 32.83 kg/m   Gen: Elderly pleasant male in no distress Pulm: Expiratory wheezes diffusely, no crackles, nonlabored tachypnea  CV: Rapid irregular without MRG, no significant LEE GI: Soft, NT, ND, +BS Neuro: Alert and oriented. No new focal deficits. Ext: Warm, no deformities Skin: No rashes, lesions or ulcers on visualized skin   Assessment & Plan: NSTEMI on CAD: Known large scar on myoview previously. Troponin trending down. Tn elevation in pt w/known ICM, current AKI, and AFib with RVR due to covid infection.  - Continue ASA, heparin x48 hours, beta blocker, statin.  - prn NTG (none needed currently).   Syncope: Likely  related to generalized weakness, though not completely clear. No concomitant device findings. Echo without new valvulopathy. Carotis U/S without hemodynamically significant stenosis.  - Continue cardiac monitoring - Check orthostatics     Acute hypoxic respiratory failure due to AECOPD due to COVID-19 virus infection: CRP elevated to 13.5, no infiltrate on CXR. viral symptoms reported to start last week, thinks he caught this around Christmas time.  - Continue antitussives and supportive care.  - Starting to have some wheezes, so will start steroid, scheduled duoneb, prn albuterol neb.  - This far out from symptoms, would not benefit from antiviral  - Incentive spirometry.     Lactic acidosis: Improving.   Chronic HFrEF: Holding lasix at this time.     SSS s/p PPM: Last remote device interrogation was on 06/07/2022 and showed no events with lead parameters and battery within normal limits   AKI on stage IV CKD: Mild acute impairment, returned to baseline with CrCl stably < 76m/min.  - Avoid nephrotoxins. In absence of active chest pain, doubt CIN risk of angiography is justified.   PAF with RVR:  - Continue heparin as above - Will order metoprolol tartrate with improving BP and elevated rates.    HTN:  - Metoprolol  T2DM with hypoglycemia: Last HbA1c in 2021 was 6.5%.  - Recheck A1c  HLD:  - Statin   Gout Continue Uloric (PTA med)    Thrombocytopenia: Likely due to viral infection, accentuated by hemodilution today. No bleeding - Monitor in AM  AST elevation: Most likely  due to viral infection, will trend  AAA: Outpatient f/u   Obesity: Body mass index is 32.83 kg/m.   Patrecia Pour, MD [facility] [email or web address] 08/02/2022, 3:00 PM

## 2022-08-02 NOTE — ED Notes (Signed)
Patient's heart monitor started to alarm so this RN went into room and found that patient had run of vtach. Patient was alert and able to follow commands but had a dazed look on his face. This RN spoke with provider Bonner Puna and made him aware of same. Admitting provider requesting that cardiology be made aware of same.

## 2022-08-02 NOTE — ED Notes (Signed)
This RN spoke with Mcdowell MD with cardiology to make him aware of patient's run of vtach. Per cardiology continue with orders of blood work and magnesium infusion ordered by admitting provider Grunz. Patient remains stable and alert at this time.

## 2022-08-02 NOTE — Progress Notes (Signed)
Date and time results received: 08/02/22 2020  Test: Troponin  Critical Value: 4576 ng/L  Name of Provider Notified: E. Chen,MD  Awaiting response.

## 2022-08-02 NOTE — ED Notes (Signed)
This RN assisted patient to bedside commode at this time. Family remains at bedside with patient. Patient instructed to call when finished. Call bell within reach.

## 2022-08-02 NOTE — Progress Notes (Signed)
ANTICOAGULATION CONSULT NOTE   Pharmacy Consult for Heparin  Indication: chest pain/ACS  No Known Allergies  Patient Measurements: Height: '5\' 5"'$  (165.1 cm) Weight: 89.5 kg (197 lb 5 oz) IBW/kg (Calculated) : 61.5  Vital Signs: Temp: 98.3 F (36.8 C) (01/03 1256) Temp Source: Oral (01/03 1256) BP: 134/83 (01/03 1150) Pulse Rate: 119 (01/03 1150)  Labs: Recent Labs    08/01/22 0030 08/01/22 0242 08/01/22 0843 08/01/22 0843 08/01/22 1640 08/02/22 0250 08/02/22 0254 08/02/22 1202  HGB 14.3  --   --   --   --   --  11.7*  --   HCT 44.3  --   --   --   --   --  36.9*  --   PLT 140*  --   --   --   --   --  118*  --   HEPARINUNFRC  --   --  0.51   < > 0.24* 0.19*  --  0.32  CREATININE 3.08*  --   --   --   --   --  2.39*  --   TROPONINIHS 10,232* 4,401* 0,272*  --   --   --   --   --    < > = values in this interval not displayed.     Estimated Creatinine Clearance: 24.9 mL/min (A) (by C-G formula based on SCr of 2.39 mg/dL (H)).   Medical History: Past Medical History:  Diagnosis Date   AAA (abdominal aortic aneurysm) (HCC)    COPD (chronic obstructive pulmonary disease) (HCC)    Coronary artery disease 2000   Status post CABG   Dyslipidemia    HFrEF (heart failure with reduced ejection fraction) (HCC)    History of GI bleed    History of kidney stones    Hypertension    Obesity    Osteoarthritis    PAF (paroxysmal atrial fibrillation) (HCC)    Prostate cancer (HCC)    Sleep apnea    USING CPAP   Spinal stenosis    SSS (sick sinus syndrome) ([place]) 2000   s/p PPM   Type 2 diabetes mellitus ([place])     Assessment: 82 y/o M with dizziness and weakness for one week. Found to have elevated high sensitivity troponin. Starting heparin. PTA meds reviewed. Labs above reviewed.   Heparin level therapeutic s/p rate increase to 1200 units/hr  Goal of Therapy:  Heparin level 0.3-0.7 units/ml Monitor platelets by anticoagulation protocol: Yes   Plan:  Continue  heparin gtt at 1200 units/hr Daily HL, CBC, s/s bleeding  Bertis Ruddy, PharmD, [facility] Clinical Pharmacist ED Pharmacist Phone # [phone] 08/02/2022 1:17 PM

## 2022-08-03 DIAGNOSIS — I214 Non-ST elevation (NSTEMI) myocardial infarction: Secondary | ICD-10-CM | POA: Diagnosis not present

## 2022-08-03 LAB — HEPARIN LEVEL (UNFRACTIONATED): Heparin Unfractionated: 0.36 IU/mL (ref 0.30–0.70)

## 2022-08-03 LAB — CBC WITH DIFFERENTIAL/PLATELET
Abs Immature Granulocytes: 0.02 10*3/uL (ref 0.00–0.07)
Basophils Absolute: 0 10*3/uL (ref 0.0–0.1)
Basophils Relative: 0 %
Eosinophils Absolute: 0 10*3/uL (ref 0.0–0.5)
Eosinophils Relative: 0 %
HCT: 37.1 % — ABNORMAL LOW (ref 39.0–52.0)
Hemoglobin: 12.2 g/dL — ABNORMAL LOW (ref 13.0–17.0)
Immature Granulocytes: 1 %
Lymphocytes Relative: 3 %
Lymphs Abs: 0.1 10*3/uL — ABNORMAL LOW (ref 0.7–4.0)
MCH: 28.1 pg (ref 26.0–34.0)
MCHC: 32.9 g/dL (ref 30.0–36.0)
MCV: 85.5 fL (ref 80.0–100.0)
Monocytes Absolute: 0.1 10*3/uL (ref 0.1–1.0)
Monocytes Relative: 2 %
Neutro Abs: 4.1 10*3/uL (ref 1.7–7.7)
Neutrophils Relative %: 94 %
Platelets: 118 10*3/uL — ABNORMAL LOW (ref 150–400)
RBC: 4.34 MIL/uL (ref 4.22–5.81)
RDW: 15.3 % (ref 11.5–15.5)
WBC: 4.3 10*3/uL (ref 4.0–10.5)
nRBC: 0 % (ref 0.0–0.2)

## 2022-08-03 LAB — GLUCOSE, CAPILLARY
Glucose-Capillary: 175 mg/dL — ABNORMAL HIGH (ref 70–99)
Glucose-Capillary: 209 mg/dL — ABNORMAL HIGH (ref 70–99)
Glucose-Capillary: 218 mg/dL — ABNORMAL HIGH (ref 70–99)
Glucose-Capillary: 243 mg/dL — ABNORMAL HIGH (ref 70–99)
Glucose-Capillary: 247 mg/dL — ABNORMAL HIGH (ref 70–99)
Glucose-Capillary: 248 mg/dL — ABNORMAL HIGH (ref 70–99)

## 2022-08-03 LAB — COMPREHENSIVE METABOLIC PANEL
ALT: 36 U/L (ref 0–44)
AST: 114 U/L — ABNORMAL HIGH (ref 15–41)
Albumin: 2.7 g/dL — ABNORMAL LOW (ref 3.5–5.0)
Alkaline Phosphatase: 78 U/L (ref 38–126)
Anion gap: 12 (ref 5–15)
BUN: 61 mg/dL — ABNORMAL HIGH (ref 8–23)
CO2: 20 mmol/L — ABNORMAL LOW (ref 22–32)
Calcium: 8.3 mg/dL — ABNORMAL LOW (ref 8.9–10.3)
Chloride: 97 mmol/L — ABNORMAL LOW (ref 98–111)
Creatinine, Ser: 2.54 mg/dL — ABNORMAL HIGH (ref 0.61–1.24)
GFR, Estimated: 25 mL/min — ABNORMAL LOW (ref >60)
Glucose, Bld: 248 mg/dL — ABNORMAL HIGH (ref 70–99)
Potassium: 4.5 mmol/L (ref 3.5–5.1)
Sodium: 129 mmol/L — ABNORMAL LOW (ref 135–145)
Total Bilirubin: 1.4 mg/dL — ABNORMAL HIGH (ref 0.3–1.2)
Total Protein: 6.6 g/dL (ref 6.5–8.1)

## 2022-08-03 LAB — MAGNESIUM: Magnesium: 2.7 mg/dL — ABNORMAL HIGH (ref 1.7–2.4)

## 2022-08-03 LAB — C-REACTIVE PROTEIN: CRP: 15.2 mg/dL — ABNORMAL HIGH (ref <1.0)

## 2022-08-03 LAB — PHOSPHORUS: Phosphorus: 4.3 mg/dL (ref 2.5–4.6)

## 2022-08-03 MED ORDER — INSULIN ASPART 100 UNIT/ML IJ SOLN
0.0000 [IU] | Freq: Three times a day (TID) | INTRAMUSCULAR | Status: DC
  Administered 2022-08-03: 5 [IU] via SUBCUTANEOUS
  Administered 2022-08-03: 3 [IU] via SUBCUTANEOUS
  Administered 2022-08-03: 5 [IU] via SUBCUTANEOUS
  Administered 2022-08-04: 2 [IU] via SUBCUTANEOUS
  Administered 2022-08-04: 3 [IU] via SUBCUTANEOUS
  Administered 2022-08-04: 2 [IU] via SUBCUTANEOUS
  Administered 2022-08-05: 3 [IU] via SUBCUTANEOUS
  Administered 2022-08-05: 8 [IU] via SUBCUTANEOUS

## 2022-08-03 MED ORDER — CLOPIDOGREL BISULFATE 75 MG PO TABS
75.0000 mg | ORAL_TABLET | Freq: Every day | ORAL | Status: DC
  Administered 2022-08-03 – 2022-08-06 (×4): 75 mg via ORAL
  Filled 2022-08-03 (×4): qty 1

## 2022-08-03 MED ORDER — INSULIN ASPART 100 UNIT/ML IJ SOLN
0.0000 [IU] | Freq: Every day | INTRAMUSCULAR | Status: DC
  Administered 2022-08-03: 2 [IU] via SUBCUTANEOUS
  Administered 2022-08-04: 3 [IU] via SUBCUTANEOUS
  Administered 2022-08-05: 2 [IU] via SUBCUTANEOUS

## 2022-08-03 MED ORDER — METHYLPREDNISOLONE SODIUM SUCC 40 MG IJ SOLR
40.0000 mg | Freq: Every day | INTRAMUSCULAR | Status: DC
  Administered 2022-08-03 – 2022-08-05 (×3): 40 mg via INTRAVENOUS
  Filled 2022-08-03 (×3): qty 1

## 2022-08-03 MED ORDER — HEPARIN SODIUM (PORCINE) 5000 UNIT/ML IJ SOLN
5000.0000 [IU] | Freq: Three times a day (TID) | INTRAMUSCULAR | Status: DC
  Administered 2022-08-03 – 2022-08-05 (×8): 5000 [IU] via SUBCUTANEOUS
  Filled 2022-08-03 (×8): qty 1

## 2022-08-03 NOTE — Progress Notes (Signed)
Pt rang out on call bell to notify staff "My arm is bleeding." Upon entering room at approx 0020, Pt's IV was noted to be on the floor. 4x4 gauze applied to IV site and tight pressure held for 5 min w. Hemostasis achieved. VSS. Pt denies dizziness, weakness, or light headedness. EBL difficult to estimate as pt's gown had absorbed most of it. Pharmacy called and notified of situation. Recommendations to re-start heparin in L-arm PIV as long as pt is not actively bleeding. Heparin restarted @ 0030. Primary RN, [place] notified.

## 2022-08-03 NOTE — Progress Notes (Signed)
[facility] PROGRESS NOTE  BELVIN GAUSS (DOB: [birth year]) FAO:[id number] PCP: Monico Blitz, MD  Brief Narrative: [name] is an 82 y.o. male with a history of CAD s/p 4v CABG (2000), atrial fibrillation, SSS s/p PPM, T2DM, HTN, HLD, AAA, obesity who presented to the ED at [facility] on 08/01/2022 with 5 days of cough and an episode of passing out found to be in rapid atrial fibrillation with hypotension and +covid PCR. AKI (Cr 3.08 from baseline 2.2-2.7) noted with lactic acidosis. No pneumonia on CXR or hypoxemia. CT head nonacute. Troponin elevated to 10,232 for which IV heparin started, aspirin given, and IVF given with some improvement in hypotension. Given his duration of symptoms, no hypoxia nor infiltrate on CXR, antiviral therapy not initiated. The patient was transferred to [facility] with cardiology consulting, deferring angiography with elevated creatinine and no anginal symptoms. IV heparin given for NSTEMI in the setting of covid-19 infection. Steroids initiated. PT/OT pending.  Subjective: Feeling better today in room on 4E. Slept well with CPAP. No oxygen needed, has not gotten a breathing treatment (as of my interview this morning). No chest pain or dyspnea. Hasn't been getting OOB much at all, but had his rolling walker brought from home to help with this. Granddaughter at bedside.  Objective: BP 103/68 (BP Location: Left Arm)   Pulse 82   Temp 97.6 F (36.4 C) (Oral)   Resp 20   Ht '5\' 5"'$  (1.651 m)   Wt 89.5 kg   SpO2 92%   BMI 32.83 kg/m   Gen: Pleasant elderly male in no distress Pulm: Nonlabored, end-expiratory wheezes noted without crackles  CV: RRR, occasional PVC on monitor, no pitting edema. GI: Soft, NT, ND, +BS  Neuro: Alert and oriented. No new focal deficits. Ext: Warm, no deformities Skin: No new rashes, lesions or ulcers on visualized skin   Assessment & Plan: NSTEMI on CAD: Known large scar on myoview previously. Troponin trending down. Tn elevation in pt  w/known ICM, current AKI, and AFib with RVR due to covid infection.  - Continue ASA, add plavix as part of medical management, plan DAPT x1 month per cardiology. Troponin fortunately clearing and no evidence of angina. s/p heparin gtt x48 hrs.  - Continue beta blocker, statin.  - prn NTG (none needed currently).   Syncope: Likely related to generalized weakness, though not completely clear. No concomitant device findings. Echo without new valvulopathy. Carotis U/S without hemodynamically significant stenosis.  - Continue cardiac monitoring - Check orthostatics today.    Acute hypoxic respiratory failure due to AECOPD due to COVID-19 virus infection: CRP elevated to 13.5 and persistently up > 15.2, no infiltrate on CXR. viral symptoms reported to start last week, thinks he caught this around Christmas time.  - Continue antitussives and supportive care.  - Wheezing > started steroids, will continue. Due to hyperglycemia, lower dose - Urged to ask for nebs, got one after this encounter. Plan to for now continue scheduled duoneb, prn albuterol neb.  - This far out from symptoms, would not benefit from antiviral. Airborne isolation continues.  - Given thrombogenicity associated with covid, will initiate [place] heparin for VTE ppx. - Incentive spirometry.     Lactic acidosis: Cleared  Chronic HFrEF: Holding lasix at this time.     SSS s/p PPM: Last remote device interrogation was on 06/07/2022 and showed no events with lead parameters and battery within normal limits. - Having frequent PVCs, defer to cardiology, though not an ideal amiodarone candidate and doesn't  appear currently to be symptomatic with this.    AKI on stage IV CKD: Mild acute impairment, returned to baseline with CrCl stably < 34m/min.  - Avoid nephrotoxins. In absence of active chest pain, doubt CIN risk of angiography is justified.   PAF with RVR: Burden 0.2% on device interrogation. No anticoagulation due to hx GI bleed (though  last report showed healed ulcer).  - Continue heparin [place] as above - Continue metoprolol, will consolidate to succinate prior to discharge.   HTN:  - Metoprolol  T2DM with hypoglycemia now with steroid-induced hyperglycemia: Last HbA1c in 2021 was 6.5%.  - Add moderate SSI and HS coverage, will deescalate steroids as above.  HLD:  - Statin   Gout - Continue uloric (PTA med)    Thrombocytopenia: Likely due to viral infection, accentuated by hemodilution, has stabilized >100k. - Monitor level and for bleeding.  AST elevation: Most likely due to viral infection, improved.  AAA: Outpatient f/u  Hyponatremia: Will continue monitoring.    Obesity: Body mass index is 32.83 kg/m.   RPatrecia Pour MD [facility] [email or web address] 08/03/2022, 12:57 PM

## 2022-08-03 NOTE — Progress Notes (Signed)
States he does not wear a cpap at home, would like to not wear niv tonight

## 2022-08-03 NOTE — Progress Notes (Addendum)
Rounding Note    Patient Name: [name] Date of Encounter: 08/03/2022  [facility] Cardiologist: Dr. Myles Gip  Subjective   Denies any CP or SOB.   Inpatient Medications    Scheduled Meds:  vitamin C  500 mg Oral Daily   aspirin EC  81 mg Oral Daily   atorvastatin  40 mg Oral QHS   dextromethorphan-guaiFENesin  1 tablet Oral BID   febuxostat  40 mg Oral Daily   insulin aspart  0-15 Units Subcutaneous TID WC   insulin aspart  0-5 Units Subcutaneous QHS   ipratropium-albuterol  3 mL Nebulization BID   methylPREDNISolone (SOLU-MEDROL) injection  40 mg Intravenous Daily   metoprolol tartrate  12.5 mg Oral BID   pantoprazole  40 mg Oral Daily   zinc sulfate  220 mg Oral Daily   Continuous Infusions:  PRN Meds: acetaminophen, albuterol, chlorpheniramine-HYDROcodone, guaiFENesin-dextromethorphan, prochlorperazine   Vital Signs    Vitals:   08/02/22 2232 08/03/22 0445 08/03/22 0805 08/03/22 0857  BP:  110/81 124/77   Pulse: (!) 106 90 91   Resp: '20 20 20   '$ Temp:  97.7 F (36.5 C) 98 F (36.7 C)   TempSrc:  Oral Oral   SpO2: 100% 98% 100% 94%  Weight:      Height:        Intake/Output Summary (Last 24 hours) at 08/03/2022 0911 Last data filed at 08/03/2022 0837 Gross per 24 hour  Intake 534.15 ml  Output 500 ml  Net 34.15 ml      08/01/2022   12:19 AM 05/09/2022    1:12 PM 05/06/2021   11:16 AM  Last 3 Weights  Weight (lbs) 197 lb 5 oz 197 lb 205 lb 6.4 oz  Weight (kg) 89.5 kg 89.359 kg 93.169 kg      Telemetry    Atrial fibrillation with HR 80s - Personally Reviewed  ECG    Atrial fibrillation with LBBB - Personally Reviewed  Physical Exam   GEN: No acute distress.   Neck: No JVD Cardiac: irregular, no murmurs, rubs, or gallops.  Respiratory: Clear to auscultation bilaterally. GI: Soft, nontender, non-distended  MS: No edema; No deformity. Neuro:  Nonfocal  Psych: Normal affect   Labs    High Sensitivity Troponin:   Recent  Labs  Lab 08/01/22 0030 08/01/22 0242 08/01/22 0843 08/02/22 1833  TROPONINIHS 10,232* 4,166* 8,473* 4,576*     Chemistry Recent Labs  Lab 08/01/22 0030 08/02/22 0254 08/02/22 1833 08/03/22 0114  NA 131* 130* 127* 129*  K 4.0 3.6 4.2 4.5  CL 96* 99 98 97*  CO2 22 20* 18* 20*  GLUCOSE 60* 142* 177* 248*  BUN 57* 58* 54* 61*  CREATININE 3.08* 2.39* 2.38* 2.54*  CALCIUM 8.7* 8.0* 8.1* 8.3*  MG  --  1.9  --  2.7*  PROT 7.5 5.9*  --  6.6  ALBUMIN 3.4* 2.6*  --  2.7*  AST 69* 132*  --  114*  ALT 19 31  --  36  ALKPHOS 96 74  --  78  BILITOT 1.8* 1.1  --  1.4*  GFRNONAA 20* 27* 27* 25*  ANIONGAP '13 11 11 12    '$ Lipids No results for input(s): "CHOL", "TRIG", "HDL", "LABVLDL", "LDLCALC", "CHOLHDL" in the last 168 hours.  Hematology Recent Labs  Lab 08/02/22 0254 08/02/22 1833 08/03/22 0114  WBC 6.1 6.3 4.3  RBC 4.22 4.37 4.34  HGB 11.7* 12.2* 12.2*  HCT 36.9* 38.4* 37.1*  MCV  87.4 87.9 85.5  MCH 27.7 27.9 28.1  MCHC 31.7 31.8 32.9  RDW 15.5 15.5 15.3  PLT 118* 119* 118*   Thyroid No results for input(s): "TSH", "FREET4" in the last 168 hours.  BNP Recent Labs  Lab 08/01/22 0030  BNP 1,428.0*    DDimer  Recent Labs  Lab 08/02/22 0254  DDIMER 2.40*     Radiology    ECHOCARDIOGRAM COMPLETE  Result Date: 08/01/2022    ECHOCARDIOGRAM REPORT   Patient Name:   [name] Date of Exam: 08/01/2022 Medical Rec #:  [id number]       Height:       65.0 in Accession #:    [id number]      Weight:       197.3 lb Date of Birth:  10-23-1940        BSA:          1.967 m Patient Age:    82 years        BP:           110/80 mmHg Patient Gender: M               HR:           89 bpm. Exam Location:  [facility] Procedure: 2D Echo, Cardiac Doppler and Color Doppler Indications:    CHF-Acute Diastolic Z12.45  History:        Patient has prior history of Echocardiogram examinations, most                 recent 11/25/2019. Previous Myocardial Infarction and CAD,                  Pacemaker, COPD, Arrythmias:Atrial Fibrillation; Risk                 Factors:Hypertension, Diabetes, Dyslipidemia and Former Smoker.                 COVID-19 virus infection, SSS (sick sinus syndrome) ([place]), HFrEF                 (heart failure with reduced ejection fraction) (HCC) (From Hx).  Sonographer:    Alvino Chapel RCS Referring Phys: [id number] OLADAPO ADEFESO IMPRESSIONS  1. Left ventricular ejection fraction, by estimation, is approximately 25%. The left ventricle has severely decreased function. The left ventricle demonstrates regional wall motion abnormalities (see scoring diagram/findings for description). There is moderate asymmetric left ventricular hypertrophy of the basal segment. Left ventricular diastolic parameters are indeterminate.  2. Right ventricular systolic function is moderately reduced. The right ventricular size is normal. There is mildly elevated pulmonary artery systolic pressure. The estimated right ventricular systolic pressure is 82.5 mmHg.  3. Left atrial size was severely dilated.  4. Right atrial size was severely dilated.  5. The mitral valve is grossly normal. Moderate mitral valve regurgitation.  6. The aortic valve is tricuspid. Aortic valve regurgitation is not visualized. Aortic valve sclerosis/calcification is present, without any evidence of aortic stenosis.  7. The inferior vena cava is dilated in size with <50% respiratory variability, suggesting right atrial pressure of 15 mmHg. Comparison(s): Prior images reviewed side by side. LVEF has decreased in comparison, approximately 25% at this point and with wall motion abnormalities suggestive of ischemic cardiomyopathy. Moderate mitral regurgitation. Moderate RV dysfunction. FINDINGS  Left Ventricle: Left ventricular ejection fraction, by estimation, is 25%. The left ventricle has severely decreased function. The left ventricle demonstrates regional wall motion abnormalities. The left ventricular internal cavity  size was  normal in size. There is moderate asymmetric left ventricular hypertrophy of the basal segment. Left ventricular diastolic function could not be evaluated due to atrial fibrillation. Left ventricular diastolic parameters are indeterminate.  LV Wall Scoring: The antero-lateral wall, entire inferior wall, and posterior wall are akinetic. The anterior wall, mid inferoseptal segment, and basal inferoseptal segment are hypokinetic. The entire anterior septum, apical lateral segment, apical anterior segment, and apex are normal. Right Ventricle: The right ventricular size is normal. No increase in right ventricular wall thickness. Right ventricular systolic function is moderately reduced. There is mildly elevated pulmonary artery systolic pressure. The tricuspid regurgitant velocity is 2.30 m/s, and with an assumed right atrial pressure of 15 mmHg, the estimated right ventricular systolic pressure is 29.5 mmHg. Left Atrium: Left atrial size was severely dilated. Right Atrium: Right atrial size was severely dilated. Pericardium: There is no evidence of pericardial effusion. Mitral Valve: The mitral valve is grossly normal. Moderate mitral valve regurgitation, with centrally-directed jet. Tricuspid Valve: The tricuspid valve is grossly normal. Tricuspid valve regurgitation is mild. Aortic Valve: The aortic valve is tricuspid. Aortic valve regurgitation is not visualized. Aortic valve sclerosis/calcification is present, without any evidence of aortic stenosis. Pulmonic Valve: The pulmonic valve was grossly normal. Pulmonic valve regurgitation is trivial. Aorta: The aortic root is normal in size and structure. Venous: The inferior vena cava is dilated in size with less than 50% respiratory variability, suggesting right atrial pressure of 15 mmHg. IAS/Shunts: No atrial level shunt detected by color flow Doppler. Additional Comments: A device lead is visualized.  LEFT VENTRICLE PLAX 2D LVIDd:         5.40 cm LVIDs:         4.80  cm LV PW:         1.20 cm LV IVS:        1.60 cm LVOT diam:     1.90 cm LV SV:         32 LV SV Index:   16 LVOT Area:     2.84 cm  LV Volumes (MOD) LV vol d, MOD A2C: 176.0 ml LV vol d, MOD A4C: 206.0 ml LV vol s, MOD A2C: 127.0 ml LV vol s, MOD A4C: 145.0 ml LV SV MOD A2C:     49.0 ml LV SV MOD A4C:     206.0 ml LV SV MOD BP:      59.7 ml RIGHT VENTRICLE TAPSE (M-mode): 2.0 cm LEFT ATRIUM              Index        RIGHT ATRIUM           Index LA diam:        4.60 cm  2.34 cm/m   RA Area:     31.90 cm LA Vol (A2C):   90.0 ml  45.76 ml/m  RA Volume:   119.00 ml 60.50 ml/m LA Vol (A4C):   101.0 ml 51.35 ml/m LA Biplane Vol: 97.2 ml  49.42 ml/m  AORTIC VALVE LVOT Vmax:   71.13 cm/s LVOT Vmean:  45.867 cm/s LVOT VTI:    0.114 m  AORTA Ao Root diam: 3.70 cm MITRAL VALVE                  TRICUSPID VALVE MV Area (PHT): 5.54 cm       TR Peak grad:   21.2 mmHg MV Decel Time: 137 msec       TR Vmax:  230.00 cm/s MR Peak grad:    90.6 mmHg MR Mean grad:    54.0 mmHg    SHUNTS MR Vmax:         476.00 cm/s  Systemic VTI:  0.11 m MR Vmean:        340.0 cm/s   Systemic Diam: 1.90 cm MR PISA:         2.26 cm MR PISA Eff ROA: 15 mm MR PISA Radius:  0.60 cm MV E velocity: 110.00 cm/s Rozann Lesches MD Electronically signed by Rozann Lesches MD Signature Date/Time: 08/01/2022/12:27:39 PM    Final    US Carotid Bilateral  Result Date: 08/01/2022 CLINICAL DATA:  82 year old male with a history of syncope EXAM: BILATERAL CAROTID DUPLEX ULTRASOUND TECHNIQUE: Pearline Cables scale imaging, color Doppler and duplex ultrasound were performed of bilateral carotid and vertebral arteries in the neck. COMPARISON:  None Available. FINDINGS: Criteria: Quantification of carotid stenosis is based on velocity parameters that correlate the residual internal carotid diameter with NASCET-based stenosis levels, using the diameter of the distal internal carotid lumen as the denominator for stenosis measurement. The following velocity measurements  were obtained: RIGHT ICA:  Systolic 87 cm/sec, Diastolic 30 cm/sec CCA:  90 cm/sec SYSTOLIC ICA/CCA RATIO:  1.0 ECA:  71 cm/sec LEFT ICA:  Systolic 415 cm/sec, Diastolic 35 cm/sec CCA:  73 cm/sec SYSTOLIC ICA/CCA RATIO:  1.4 ECA:  101 cm/sec Right Brachial SBP: Not acquired Left Brachial SBP: Not acquired RIGHT CAROTID ARTERY: No significant calcifications of the right common carotid artery. Intermediate waveform maintained. Moderate heterogeneous and partially calcified plaque at the right carotid bifurcation. Lumen shadowing. Low resistance waveform of the right ICA. No significant tortuosity. RIGHT VERTEBRAL ARTERY: No flow identified in the right vertebral artery. LEFT CAROTID ARTERY: No significant calcifications of the left common carotid artery. Intermediate waveform maintained. Moderate heterogeneous and partially calcified plaque at the left carotid bifurcation. Lumen shadowing. Low resistance waveform of the left ICA. No significant tortuosity. LEFT VERTEBRAL ARTERY:  Antegrade flow with low resistance waveform. IMPRESSION: Color duplex indicates moderate heterogeneous and calcified plaque, with no hemodynamically significant stenosis by duplex criteria in the extracranial cerebrovascular circulation. No flow identified in the right vertebral artery. Signed, Dulcy Fanny. Nadene Rubins, RPVI [facility] Radiology Electronically Signed   By: Corrie Mckusick D.O.   On: 08/01/2022 09:54    Cardiac Studies   Echo 08/01/2022  1. Left ventricular ejection fraction, by estimation, is approximately  25%. The left ventricle has severely decreased function. The left  ventricle demonstrates regional wall motion abnormalities (see scoring  diagram/findings for description). There is  moderate asymmetric left ventricular hypertrophy of the basal segment.  Left ventricular diastolic parameters are indeterminate.   2. Right ventricular systolic function is moderately  reduced. The right  ventricular size is normal. There is mildly elevated pulmonary artery  systolic pressure. The estimated right ventricular systolic pressure is  83.0 mmHg.   3. Left atrial size was severely dilated.   4. Right atrial size was severely dilated.   5. The mitral valve is grossly normal. Moderate mitral valve  regurgitation.   6. The aortic valve is tricuspid. Aortic valve regurgitation is not  visualized. Aortic valve sclerosis/calcification is present, without any  evidence of aortic stenosis.   7. The inferior vena cava is dilated in size with <50% respiratory  variability, suggesting right atrial pressure of 15 mmHg.   Comparison(s): Prior images reviewed side by side. LVEF has decreased in  comparison, approximately 25% at this point and with wall motion  abnormalities suggestive of ischemic cardiomyopathy. Moderate mitral  regurgitation. Moderate RV dysfunction.    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 Patient is a poor historian, denies any CP, but mentions  he was not feeling his normal self and had SOB. Serial trop 10232 --> 9832 --> 8473 --> 4576  - Cr >3, unable to consider for cardiac catheterization, manage medically. Transferred from [facility] to [place].   - restarted metoprolol at lower dose 12.'5mg'$  BID, HR well controlled. No chest pain. Will consolidate to metoprolol succinate prior to D/C  CAD s/p CABG: previously had NSTEMI in 4/21, had drop in EF, but due to elevated SCr, he had myoview which showed large scar and minimal ischemia  - hesitant to add plavix given prior h/o GI bleed, continue ASA and lipitor  ICM: EF 30-35%. Repeat echo EF 25%,  moderate asymmetric LVH of basal segment, severe biatrial enlargement, RVSP 36.2 mmHg.   PAF: burden 0.2% by last device interrogation. No anticoagulation due to prior GI bleed  Symptomatic bradycardia s/p PPM: followed by Dr. Myles Gip  HTN  CKD: Cr near baseline 2.5  Hyponatremia: Na 129, related to COVID infection?      For questions or updates, please contact [place] Please consult [email or web address] for contact info under        Signed, Almyra Deforest, [place]  08/03/2022, 9:11 AM    History and all data above reviewed.  Patient examined.  I agree with the findings as above.   He again denies chest pain or SOB. The patient exam reveals COR:RRR  ,  Lungs:   Decreased breath sounds  ,  Abd: Positive bowel sounds, no rebound no guarding, Ext No edema  .  All available labs, radiology testing, previous records reviewed. Agree with documented assessment and plan. NSTEMI:  Medical management.  I had another long conversation with the patient and his granddaughter.  I do think it is prudent to use Plavix and ASA for one month post this acute event.  I did go back and reviewed the last GI note that I could find and in 2022 he had a healed ulcer.  He was not having any active bleed.   Jeneen Rinks [facility]  10:21 AM  08/03/2022

## 2022-08-03 NOTE — Progress Notes (Addendum)
Pt admitted to 4E15 from ED. Pt oriented to unit. CCMD called. Pt made comfortable. Call light by pt.   Pt's daughter requested for CPAP. Pt has refused. Milderd Meager, DO notified.

## 2022-08-03 NOTE — Progress Notes (Signed)
ANTICOAGULATION CONSULT NOTE- follow-up  Pharmacy Consult for Heparin  Indication: chest pain/ACS  No Known Allergies  Patient Measurements: Height: '5\' 5"'$  (165.1 cm) Weight: 89.5 kg (197 lb 5 oz) IBW/kg (Calculated) : 61.5  Vital Signs: Temp: 97.7 F (36.5 C) (01/04 0445) Temp Source: Oral (01/04 0445) BP: 110/81 (01/04 0445) Pulse Rate: 90 (01/04 0445)  Labs: Recent Labs    08/01/22 0242 08/01/22 0843 08/01/22 1640 08/02/22 0250 08/02/22 0254 08/02/22 1202 08/02/22 1833 08/03/22 0114  HGB  --   --   --   --  11.7*  --  12.2* 12.2*  HCT  --   --   --   --  36.9*  --  38.4* 37.1*  PLT  --   --   --   --  118*  --  119* 118*  HEPARINUNFRC  --  0.51   < > 0.19*  --  0.32  --  0.36  CREATININE  --   --   --   --  2.39*  --  2.38* 2.54*  TROPONINIHS 2,774* 8,473*  --   --   --   --  4,576*  --    < > = values in this interval not displayed.     Estimated Creatinine Clearance: 23.5 mL/min (A) (by C-G formula based on SCr of 2.54 mg/dL (H)).   Medical History: Past Medical History:  Diagnosis Date   AAA (abdominal aortic aneurysm) (HCC)    COPD (chronic obstructive pulmonary disease) (HCC)    Coronary artery disease 2000   Status post CABG   Dyslipidemia    HFrEF (heart failure with reduced ejection fraction) (HCC)    History of GI bleed    History of kidney stones    Hypertension    Obesity    Osteoarthritis    PAF (paroxysmal atrial fibrillation) (HCC)    Prostate cancer (HCC)    Sleep apnea    USING CPAP   Spinal stenosis    SSS (sick sinus syndrome) ([place]) 2000   s/p PPM   Type 2 diabetes mellitus ([place])     Assessment: 82 y/o M with dizziness and weakness for one week. Found to have elevated high sensitivity troponin. Starting heparin. PTA meds reviewed. Labs above reviewed.   08/03/2022 AM update: HL: 0.36 No signs of bleeding or issues with infusion (current to this AM)  It was communicated, from 3rd shift, that patient pulled out IV prior to  00:20 where heparin infusing~ RN estimates IV has been out ~30 minutes; resumed infusion in another IV in opposite arm. RN reports patient's gown is saturated in blood and there is a hematoma at the site where IV was pulled out. Bleeding stopped with pressure  Goal of Therapy:  Heparin level 0.3-0.7 units/ml Monitor platelets by anticoagulation protocol: Yes   Plan:  Continue heparin gtt at 1200 units/hr Daily HL, CBC, s/s bleeding  Autie Vasudevan BS, PharmD, BCPS Clinical Pharmacist 08/03/2022 7:18 AM  Contact: [phone] after 3 PM  "Be curious, not judgmental..." -Jamal Maes

## 2022-08-03 NOTE — Plan of Care (Signed)

## 2022-08-04 DIAGNOSIS — I214 Non-ST elevation (NSTEMI) myocardial infarction: Secondary | ICD-10-CM | POA: Diagnosis not present

## 2022-08-04 LAB — C-REACTIVE PROTEIN: CRP: 8.9 mg/dL — ABNORMAL HIGH (ref <1.0)

## 2022-08-04 LAB — CBC WITH DIFFERENTIAL/PLATELET
Abs Immature Granulocytes: 0.04 10*3/uL (ref 0.00–0.07)
Basophils Absolute: 0 10*3/uL (ref 0.0–0.1)
Basophils Relative: 0 %
Eosinophils Absolute: 0 10*3/uL (ref 0.0–0.5)
Eosinophils Relative: 0 %
HCT: 35.6 % — ABNORMAL LOW (ref 39.0–52.0)
Hemoglobin: 11.9 g/dL — ABNORMAL LOW (ref 13.0–17.0)
Immature Granulocytes: 1 %
Lymphocytes Relative: 3 %
Lymphs Abs: 0.2 10*3/uL — ABNORMAL LOW (ref 0.7–4.0)
MCH: 28 pg (ref 26.0–34.0)
MCHC: 33.4 g/dL (ref 30.0–36.0)
MCV: 83.8 fL (ref 80.0–100.0)
Monocytes Absolute: 0.3 10*3/uL (ref 0.1–1.0)
Monocytes Relative: 4 %
Neutro Abs: 6.5 10*3/uL (ref 1.7–7.7)
Neutrophils Relative %: 92 %
Platelets: 126 10*3/uL — ABNORMAL LOW (ref 150–400)
RBC: 4.25 MIL/uL (ref 4.22–5.81)
RDW: 15.1 % (ref 11.5–15.5)
WBC: 7.1 10*3/uL (ref 4.0–10.5)
nRBC: 0 % (ref 0.0–0.2)

## 2022-08-04 LAB — MAGNESIUM: Magnesium: 2.5 mg/dL — ABNORMAL HIGH (ref 1.7–2.4)

## 2022-08-04 LAB — COMPREHENSIVE METABOLIC PANEL
ALT: 35 U/L (ref 0–44)
AST: 78 U/L — ABNORMAL HIGH (ref 15–41)
Albumin: 2.6 g/dL — ABNORMAL LOW (ref 3.5–5.0)
Alkaline Phosphatase: 79 U/L (ref 38–126)
Anion gap: 8 (ref 5–15)
BUN: 76 mg/dL — ABNORMAL HIGH (ref 8–23)
CO2: 20 mmol/L — ABNORMAL LOW (ref 22–32)
Calcium: 8.3 mg/dL — ABNORMAL LOW (ref 8.9–10.3)
Chloride: 99 mmol/L (ref 98–111)
Creatinine, Ser: 2.52 mg/dL — ABNORMAL HIGH (ref 0.61–1.24)
GFR, Estimated: 25 mL/min — ABNORMAL LOW (ref >60)
Glucose, Bld: 227 mg/dL — ABNORMAL HIGH (ref 70–99)
Potassium: 3.9 mmol/L (ref 3.5–5.1)
Sodium: 127 mmol/L — ABNORMAL LOW (ref 135–145)
Total Bilirubin: 1.1 mg/dL (ref 0.3–1.2)
Total Protein: 5.9 g/dL — ABNORMAL LOW (ref 6.5–8.1)

## 2022-08-04 LAB — GLUCOSE, CAPILLARY
Glucose-Capillary: 153 mg/dL — ABNORMAL HIGH (ref 70–99)
Glucose-Capillary: 126 mg/dL — ABNORMAL HIGH (ref 70–99)
Glucose-Capillary: 147 mg/dL — ABNORMAL HIGH (ref 70–99)
Glucose-Capillary: 267 mg/dL — ABNORMAL HIGH (ref 70–99)

## 2022-08-04 LAB — HEMOGLOBIN A1C
Hgb A1c MFr Bld: 7.2 % — ABNORMAL HIGH (ref 4.8–5.6)
Mean Plasma Glucose: 160 mg/dL

## 2022-08-04 LAB — PHOSPHORUS: Phosphorus: 3.8 mg/dL (ref 2.5–4.6)

## 2022-08-04 NOTE — Care Management Important Message (Signed)
Important Message  Patient Details  Name: [name] MRN: [id number] Date of Birth: [birth year]   Medicare Important Message Given:  Yes     Shelda Altes 08/04/2022, 8:56 AM

## 2022-08-04 NOTE — Evaluation (Signed)
Physical Therapy Evaluation Patient Details Name: [name] MRN: [id number] DOB: 06-Jan-1941 Today's Date: 08/04/2022  History of Present Illness  Pt is an 82 y.o. male admitted to [facility] 08/02/23 with cough, syncopal episode. Workup for afib, hypotension, AKI, (+) COVID, NSTEMI. Transfer to [facility] for cardiology consult. PMH includes CAD, COPD, HF, SSS s/p PM, CKD IV, PAF with RVR, HTN, DM2, HLD, prostate CA, gout, AAA, obesity.   Clinical Impression  Pt presents with an overall decrease in functional mobility secondary to above. PTA, pt mod indep with mobility and ADLs, ambulates with rollator, lives with wife who assists with household tasks as needed; pt reports primarily sedentary, though wife remains very active. Today, pt moving well with rollator at supervision-level due to soft BP. Pt reports initial slight dizziness upon sitting up (which "happens often at home... I just take my time getting up"), otherwise asymptomatic. Pt would benefit from continued acute PT services to maximize functional mobility and independence prior to d/c home.     Orthostatic BPs Supine 115/82 (90), HR 93  Sitting 106/88 (95), HR 88  Standing 81/57 (66), HR 95  Standing after 2 min 82/61 (70), HR 92  Post-ambulation 118/52 (72)   SpO2 94-96% on RA   Recommendations for follow up therapy are one component of a multi-disciplinary discharge planning process, led by the attending physician.  Recommendations may be updated based on patient status, additional functional criteria and insurance authorization.  Follow Up Recommendations No PT follow up      Assistance Recommended at Discharge Intermittent Supervision/Assistance  Patient can return home with the following  A little help with bathing/dressing/bathroom;Assistance with cooking/housework;Assist for transportation    Equipment Recommendations None recommended by PT  Recommendations for Other Services       Functional Status Assessment Patient has  had a recent decline in their functional status and demonstrates the ability to make significant improvements in function in a reasonable and predictable amount of time.     Precautions / Restrictions Precautions Precautions: Fall;Other (comment) Precaution Comments: watch BP (mildly symptomatic orthostatic hypotension, still able to progress to ambulation) Restrictions Weight Bearing Restrictions: No      Mobility  Bed Mobility Overal bed mobility: Modified Independent             General bed mobility comments: reliant on momentum to power up    Transfers Overall transfer level: Needs assistance Equipment used: Rollator (4 wheels) Transfers: Sit to/from Stand, Bed to chair/wheelchair/BSC Sit to Stand: Supervision   Step pivot transfers: Supervision       General transfer comment: supervision for safety    Ambulation/Gait Ambulation/Gait assistance: Supervision Gait Distance (Feet): 24 Feet Assistive device: Rollator (4 wheels) Gait Pattern/deviations: Step-through pattern, Decreased stride length, Trunk flexed Gait velocity: Decreased     General Gait Details: slow, fatigued gait with rollator and supervision for safety. pt prefers to keep rollator brakes locked for "better control"  Stairs            Wheelchair Mobility    Modified Rankin (Stroke Patients Only)       Balance Overall balance assessment: Needs assistance Sitting-balance support: No upper extremity supported, Feet supported Sitting balance-Leahy Scale: Good     Standing balance support: No upper extremity supported, During functional activity Standing balance-Leahy Scale: Fair Standing balance comment: can static stand without UE support, preference for rollator  Pertinent Vitals/Pain Pain Assessment Pain Assessment: No/denies pain    Home Living Family/patient expects to be discharged to:: Private residence Living Arrangements:  Spouse/significant other Available Help at Discharge: Family;Available 24 hours/day Type of Home: House Home Access: Stairs to enter Entrance Stairs-Rails: Right;Left;Can reach both Entrance Stairs-Number of Steps: 3   Home Layout: Laundry or work area in basement;Two level;Able to live on main level with bedroom/bathroom (does not go downstairs to basement often) Home Equipment: Rolling Walker (2 wheels);Rollator (4 wheels);Cane - single point;Shower seat Additional Comments: rollator does not fit in bathroom, furniture surfs in bathroom. reports wife very active    Prior Function Prior Level of Function : Independent/Modified Independent;Driving             Mobility Comments: reports mod indep ambulating in house and community with rollator; does not leave home much, but walks to/from mailbox ADLs Comments: uses shower seat to bathe; mod indep with ADLs. wife does majority of housework and cooking; pt does light cooking. pt manages his own meds     Hand Dominance        Extremity/Trunk Assessment   Upper Extremity Assessment Upper Extremity Assessment: Overall WFL for tasks assessed    Lower Extremity Assessment Lower Extremity Assessment: Overall WFL for tasks assessed       Communication   Communication: No difficulties  Cognition Arousal/Alertness: Awake/alert Behavior During Therapy: WFL for tasks assessed/performed Overall Cognitive Status: Within Functional Limits for tasks assessed                                 General Comments: WFL for simple tasks; not formally assessed        General Comments General comments (skin integrity, edema, etc.): supine BP 115/82, down to 81/57 standing; up to 118/52 after transfer to recliner. HR 80s-90s, SpO2 >/94% on RA    Exercises     Assessment/Plan    PT Assessment Patient needs continued PT services  PT Problem List Decreased activity tolerance;Decreased balance;Decreased mobility;Cardiopulmonary  status limiting activity       PT Treatment Interventions DME instruction;Gait training;Stair training;Functional mobility training;Therapeutic activities;Therapeutic exercise;Balance training;Patient/family education    PT Goals (Current goals can be found in the Care Plan section)  Acute Rehab PT Goals Patient Stated Goal: return home PT Goal Formulation: With patient Time For Goal Achievement: 08/18/22 Potential to Achieve Goals: Good    Frequency Min 3X/week     Co-evaluation               AM-PAC PT "6 Clicks" Mobility  Outcome Measure Help needed turning from your back to your side while in a flat bed without using bedrails?: None Help needed moving from lying on your back to sitting on the side of a flat bed without using bedrails?: None Help needed moving to and from a bed to a chair (including a wheelchair)?: A Little Help needed standing up from a chair using your arms (e.g., wheelchair or bedside chair)?: A Little Help needed to walk in hospital room?: A Little Help needed climbing 3-5 steps with a railing? : A Little 6 Click Score: 20    End of Session Equipment Utilized During Treatment: Gait belt Activity Tolerance: Patient tolerated treatment well Patient left: in chair;with call bell/phone within reach Nurse Communication: Mobility status PT Visit Diagnosis: Other abnormalities of gait and mobility (R26.89)    Time: 3818-2993 PT Time Calculation (min) (ACUTE ONLY): 25 min  Charges:   PT Evaluation $PT Eval Moderate Complexity: [place], PT, DPT Acute Rehabilitation Services  Personal: [facility] Office: [place] 08/04/2022, 8:49 AM

## 2022-08-04 NOTE — Progress Notes (Addendum)
Pt and daughter have been discussing code status and he has decided that he would like to change to partial code, meds only. MD has been notified.

## 2022-08-04 NOTE — Progress Notes (Signed)
[facility] PROGRESS NOTE  [name] (DOB: 07-02-1941) ZJI:[id number] PCP: Monico Blitz, MD  Brief Narrative: [name] is an 82 y.o. male with a history of CAD s/p 4v CABG (2000), atrial fibrillation, SSS s/p PPM, T2DM, HTN, HLD, AAA, obesity who presented to the ED at [facility] on 08/01/2022 with 5 days of cough and an episode of passing out found to be in rapid atrial fibrillation with hypotension and +covid PCR. AKI (Cr 3.08 from baseline 2.2-2.7) noted with lactic acidosis. No pneumonia on CXR or hypoxemia. CT head nonacute. Troponin elevated to 10,232 for which IV heparin started, aspirin given, and IVF given with some improvement in hypotension. Given his duration of symptoms, no hypoxia nor infiltrate on CXR, antiviral therapy not initiated. The patient was transferred to [facility] with cardiology consulting, deferring angiography with elevated creatinine and no anginal symptoms. IV heparin given for NSTEMI in the setting of covid-19 infection. Steroids initiated.  Subjective: Daughter at bedside who is a retired Therapist, sports, has concerns about patient's ability to return home safely. Having some orthostatic changes/symptoms this morning. Pt declined CPAP last night and is drowsy today which is usual for him. She's brought his home CPAP which he is encouraged strongly to use.   Objective: BP (!) 112/91 (BP Location: Left Arm)   Pulse 84   Temp 97.8 F (36.6 C) (Oral)   Resp 20   Ht '5\' 5"'$  (1.651 m)   Wt 86 kg   SpO2 96%   BMI 31.55 kg/m   Gen: No distress, chronically ill-appearing Pulm: Wheezes improved but still present, no crackles  CV: RRR, no MRG, trace edema GI: Soft, NT, ND, +BS  Neuro: Alert and essentially oriented. No new focal deficits. Ext: Warm, no deformities Skin: No rashes, lesions or ulcers on visualized skin   Assessment & Plan: Code status discussion: Goals of care remain aggressive/curative of what can be cured, but with baseline severe CKD and ischemic  cardiomyopathy and COPD, the patient would be well served by having palliative follow up after discharge.  - PARTIAL CODE Status. Unfortunately, I'm unable to place a PARTIAL code status order. D/w RN that the patient opts for ACLS medication administration alone, no CPR, no intubation. This is very reasonable. I will continue discussions in the morning as medication administration without CPR in the event of cardiac arrest is futile.  NSTEMI on CAD: Known large scar on myoview previously. Troponin trending down. Tn elevation in pt w/known ICM, current AKI, and AFib with RVR due to covid infection.  - Continue ASA, added plavix as part of medical management, plan DAPT x1 month per cardiology. Troponin fortunately clearing and no evidence of angina. s/p heparin gtt x48 hrs.  - Continue beta blocker if BP tolerates - Continue statin.  - prn NTG (none needed currently).   Syncope: Likely related to generalized weakness, though not completely clear. No concomitant device findings. Echo without new valvulopathy. Carotis U/S without hemodynamically significant stenosis.  - Continue cardiac monitoring - Check orthostatics again > positive if initial supine reading was accurate (127/113 >> 81/61), though was hypotensive on standing regardless.   Acute hypoxic respiratory failure due to AECOPD due to COVID-19 virus infection: CRP declining. No infiltrate on CXR. viral symptoms reported to start last week, thinks he caught this around Christmas time.  - Continue antitussives and supportive care.  - Wheezing > started steroids, will continue, still some wheezing. Due to hyperglycemia, lower dose - Continue nebs - This far out from symptoms,  would not benefit from antiviral. Airborne isolation continues.  - Given thrombogenicity associated with covid, initiated [place] heparin for VTE ppx. - Incentive spirometry.     Lactic acidosis: Cleared  Chronic HFrEF: Holding lasix at this time.     SSS s/p PPM: Last  remote device interrogation was on 06/07/2022 and showed no events with lead parameters and battery within normal limits. - Having frequent PVCs, defer to cardiology, though not an ideal amiodarone candidate and doesn't appear currently to be symptomatic with this.    AKI on stage IV CKD: Mild acute impairment, returned to baseline with CrCl stably < 108m/min.  - Avoid nephrotoxins. In absence of active chest pain, doubt CIN risk of angiography is justified.   PAF with RVR: Burden 0.2% on device interrogation. No anticoagulation due to hx GI bleed (though last report showed healed ulcer).  - Continue heparin [place] as above - Continue metoprolol, will consolidate to succinate prior to discharge.   HTN:  - Metoprolol  T2DM with hypoglycemia now with steroid-induced hyperglycemia: Last HbA1c in 2021 was 6.5%.  - Continue moderate SSI and HS coverage, will deescalate steroids as above.  HLD:  - Statin   Gout - Continue uloric (PTA med)    Thrombocytopenia: Likely due to viral infection, accentuated by hemodilution, has stabilized >100k. Will check intermittently.  AST elevation: Most likely due to viral infection, improved.  AAA: Outpatient f/u  Hyponatremia: Will continue monitoring. Roughly stable.   Obesity: Body mass index is 31.55 kg/m.   RPatrecia Pour MD [facility] [email or web address] 08/04/2022, 6:35 PM

## 2022-08-04 NOTE — Evaluation (Signed)
Occupational Therapy Evaluation Patient Details Name: [name] MRN: [id number] DOB: [birth year] Today's Date: 08/04/2022   History of Present Illness Pt is an 82 y.o. male admitted to [facility] 08/02/23 with cough, syncopal episode. Workup for afib, hypotension, AKI, (+) COVID, NSTEMI. Transfer to [facility] for cardiology consult. PMH includes CAD, COPD, HF, SSS s/p PM, CKD IV, PAF with RVR, HTN, DM2, HLD, prostate CA, gout, AAA, obesity.   Clinical Impression   PTA patient using rollator for mobility and ADLs. Admitted for above and presents with problem list below.  Pt completing ADLs with up to min guard (he uses AE sock aide at home), transfers with min guard and functional mobility using rollator with min guard.  He was educated on safety, fall prevention and compensatory techniques. Continues to demonstrate orthostatic hypotension, but asymptomatic- see below for details.  Will follow acutely to optimize independence, safety and return to PLOF. Anticipate no further needs after dc home.   BP supine 127/113       Sitting 116/68         Standing 102/84       Standing x 3 min 81/61  BP returned to [facility] when sitting, after activity BP 109/46       Recommendations for follow up therapy are one component of a multi-disciplinary discharge planning process, led by the attending physician.  Recommendations may be updated based on patient status, additional functional criteria and insurance authorization.   Follow Up Recommendations  No OT follow up     Assistance Recommended at Discharge Intermittent Supervision/Assistance  Patient can return home with the following A little help with walking and/or transfers;A little help with bathing/dressing/bathroom;Assistance with cooking/housework;Assist for transportation;Help with stairs or ramp for entrance    Functional Status Assessment  Patient has had a recent decline in their functional status and demonstrates the ability to make significant  improvements in function in a reasonable and predictable amount of time.  Equipment Recommendations  None recommended by OT    Recommendations for Other Services       Precautions / Restrictions Precautions Precautions: Fall;Other (comment) Precaution Comments: watch BP (mildly symptomatic orthostatic hypotension, still able to progress to ambulation) Restrictions Weight Bearing Restrictions: No      Mobility Bed Mobility               General bed mobility comments: OOB in recliner upon entry    Transfers Overall transfer level: Needs assistance Equipment used: Rollator (4 wheels) Transfers: Sit to/from Stand Sit to Stand: Min guard           General transfer comment: for safety, cueing for hand placement      Balance Overall balance assessment: Needs assistance Sitting-balance support: No upper extremity supported, Feet supported Sitting balance-Leahy Scale: Good     Standing balance support: No upper extremity supported, During functional activity Standing balance-Leahy Scale: Fair Standing balance comment: relies on rollator for mobility                           ADL either performed or assessed with clinical judgement   ADL Overall ADL's : Needs assistance/impaired     Grooming: Min guard;Standing           Upper Body Dressing : Set up;Sitting   Lower Body Dressing: Sit to/from stand;With adaptive equipment;Min guard   Toilet Transfer: Min guard;Ambulation;Rollator (4 wheels)           Functional mobility during ADLs:  Min guard;Rollator (4 wheels);Cueing for safety       Vision   Vision Assessment?: No apparent visual deficits     Perception     Praxis      Pertinent Vitals/Pain Pain Assessment Pain Assessment: No/denies pain     Hand Dominance     Extremity/Trunk Assessment Upper Extremity Assessment Upper Extremity Assessment: Overall WFL for tasks assessed   Lower Extremity Assessment Lower Extremity  Assessment: Defer to PT evaluation       Communication Communication Communication: No difficulties   Cognition Arousal/Alertness: Awake/alert Behavior During Therapy: WFL for tasks assessed/performed Overall Cognitive Status: Within Functional Limits for tasks assessed                                       General Comments  BP monitored, RN present    Exercises     Shoulder Instructions      Home Living Family/patient expects to be discharged to:: Private residence Living Arrangements: Spouse/significant other Available Help at Discharge: Family;Available 24 hours/day Type of Home: House Home Access: Stairs to enter [employer] of Steps: 3 Entrance Stairs-Rails: Right;Left;Can reach both Home Layout: Laundry or work area in basement;Two level;Able to live on main level with bedroom/bathroom (does not go downstairs to basement often)     Bathroom Shower/Tub: Occupational psychologist: Handicapped height     Home Equipment: Conservation officer, nature (2 wheels);Rollator (4 wheels);Cane - single point;Shower seat;Grab bars - tub/shower;Grab bars - toilet;Adaptive equipment Adaptive Equipment: Reacher;Sock aid Additional Comments: rollator does not fit in bathroom, furniture surfs in bathroom. reports wife very active      Prior Functioning/Environment Prior Level of Function : Independent/Modified Independent;Driving             Mobility Comments: reports mod indep ambulating in house and community with rollator; does not leave home much, but walks to/from mailbox ADLs Comments: uses shower seat to bathe; mod indep with ADLs using sock aide. wife does majority of housework and cooking; pt does light cooking. pt manages his own meds        OT Problem List: Decreased strength;Decreased activity tolerance;Impaired balance (sitting and/or standing);Decreased knowledge of use of DME or AE;Decreased knowledge of precautions;Cardiopulmonary status  limiting activity;Obesity      OT Treatment/Interventions: Self-care/ADL training;Therapeutic exercise;DME and/or AE instruction;Therapeutic activities;Patient/family education;Balance training;Energy conservation    OT Goals(Current goals can be found in the care plan section) Acute Rehab OT Goals Patient Stated Goal: home OT Goal Formulation: With patient Time For Goal Achievement: 08/18/22 Potential to Achieve Goals: Good  OT Frequency: Min 2X/week    Co-evaluation              AM-PAC OT "6 Clicks" Daily Activity     Outcome Measure Help from another person eating meals?: None Help from another person taking care of personal grooming?: A Little Help from another person toileting, which includes using toliet, bedpan, or urinal?: A Little Help from another person bathing (including washing, rinsing, drying)?: A Little Help from another person to put on and taking off regular upper body clothing?: A Little Help from another person to put on and taking off regular lower body clothing?: A Little 6 Click Score: 19   End of Session Equipment Utilized During Treatment: Rollator (4 wheels) Nurse Communication: Mobility status  Activity Tolerance: Patient tolerated treatment well Patient left: in chair;with call bell/phone within reach;with family/visitor  present  OT Visit Diagnosis: Muscle weakness (generalized) (M62.81)                Time: 1655-3748 OT Time Calculation (min): 30 min Charges:  OT General Charges $OT Visit: 1 Visit OT Evaluation $OT Eval Moderate Complexity: 1 Mod OT Treatments $Self Care/Home Management : 8-22 mins  Jolaine Artist, OT Acute Rehabilitation Services Office [phone]   Delight Stare 08/04/2022, 1:04 PM

## 2022-08-04 NOTE — Progress Notes (Signed)
Pt  has home unit

## 2022-08-04 NOTE — Progress Notes (Addendum)
Rounding Note    Patient Name: [name] Date of Encounter: 08/04/2022  [facility] Cardiologist: Dr. Myles Gip  Subjective   Denies any CP or SOB.   Inpatient Medications    Scheduled Meds:  vitamin C  500 mg Oral Daily   aspirin EC  81 mg Oral Daily   atorvastatin  40 mg Oral QHS   clopidogrel  75 mg Oral Daily   dextromethorphan-guaiFENesin  1 tablet Oral BID   febuxostat  40 mg Oral Daily   heparin injection (subcutaneous)  5,000 Units Subcutaneous Q8H   insulin aspart  0-15 Units Subcutaneous TID WC   insulin aspart  0-5 Units Subcutaneous QHS   ipratropium-albuterol  3 mL Nebulization BID   methylPREDNISolone (SOLU-MEDROL) injection  40 mg Intravenous Daily   metoprolol tartrate  12.5 mg Oral BID   pantoprazole  40 mg Oral Daily   zinc sulfate  220 mg Oral Daily   Continuous Infusions:  PRN Meds: acetaminophen, albuterol, chlorpheniramine-HYDROcodone, guaiFENesin-dextromethorphan, prochlorperazine   Vital Signs    Vitals:   08/03/22 2335 08/04/22 0353 08/04/22 0600 08/04/22 0810  BP: 104/68 105/67  121/68  Pulse: 94 86    Resp: 20 16    Temp: 97.7 F (36.5 C) 97.6 F (36.4 C)  97.6 F (36.4 C)  TempSrc: Oral Oral  Oral  SpO2: 94% 95%    Weight:   86 kg   Height:        Intake/Output Summary (Last 24 hours) at 08/04/2022 0911 Last data filed at 08/04/2022 0600 Gross per 24 hour  Intake 480 ml  Output 350 ml  Net 130 ml       08/04/2022    6:00 AM 08/01/2022   12:19 AM 05/09/2022    1:12 PM  Last 3 Weights  Weight (lbs) 189 lb 9.5 oz 197 lb 5 oz 197 lb  Weight (kg) 86 kg 89.5 kg 89.359 kg      Telemetry    Atrial fibrillation with HR 80s - Personally Reviewed  ECG    Atrial fibrillation with LBBB - Personally Reviewed  Physical Exam   GEN: No acute distress.   Neck: No JVD Cardiac: irregular, no murmurs, rubs, or gallops.  Respiratory: Clear to auscultation bilaterally. GI: Soft, nontender, non-distended  MS: No edema;  No deformity. Neuro:  Nonfocal  Psych: Normal affect   Labs    High Sensitivity Troponin:   Recent Labs  Lab 08/01/22 0030 08/01/22 0242 08/01/22 0843 08/02/22 1833  TROPONINIHS 10,232* 7,564* 8,473* 4,576*      Chemistry Recent Labs  Lab 08/02/22 0254 08/02/22 1833 08/03/22 0114 08/04/22 0158  NA 130* 127* 129* 127*  K 3.6 4.2 4.5 3.9  CL 99 98 97* 99  CO2 20* 18* 20* 20*  GLUCOSE 142* 177* 248* 227*  BUN 58* 54* 61* 76*  CREATININE 2.39* 2.38* 2.54* 2.52*  CALCIUM 8.0* 8.1* 8.3* 8.3*  MG 1.9  --  2.7* 2.5*  PROT 5.9*  --  6.6 5.9*  ALBUMIN 2.6*  --  2.7* 2.6*  AST 132*  --  114* 78*  ALT 31  --  36 35  ALKPHOS 74  --  78 79  BILITOT 1.1  --  1.4* 1.1  GFRNONAA 27* 27* 25* 25*  ANIONGAP '11 11 12 8     '$ Lipids No results for input(s): "CHOL", "TRIG", "HDL", "LABVLDL", "LDLCALC", "CHOLHDL" in the last 168 hours.  Hematology Recent Labs  Lab 08/02/22 1833 08/03/22 0114 08/04/22  0158  WBC 6.3 4.3 7.1  RBC 4.37 4.34 4.25  HGB 12.2* 12.2* 11.9*  HCT 38.4* 37.1* 35.6*  MCV 87.9 85.5 83.8  MCH 27.9 28.1 28.0  MCHC 31.8 32.9 33.4  RDW 15.5 15.3 15.1  PLT 119* 118* 126*    Thyroid No results for input(s): "TSH", "FREET4" in the last 168 hours.  BNP Recent Labs  Lab 08/01/22 0030  BNP 1,428.0*     DDimer  Recent Labs  Lab 08/02/22 0254  DDIMER 2.40*      Radiology    No results found.  Cardiac Studies   Echo 08/01/2022  1. Left ventricular ejection fraction, by estimation, is approximately  25%. The left ventricle has severely decreased function. The left  ventricle demonstrates regional wall motion abnormalities (see scoring  diagram/findings for description). There is  moderate asymmetric left ventricular hypertrophy of the basal segment.  Left ventricular diastolic parameters are indeterminate.   2. Right ventricular systolic function is moderately reduced. The right  ventricular size is normal. There is mildly elevated pulmonary artery   systolic pressure. The estimated right ventricular systolic pressure is  71.0 mmHg.   3. Left atrial size was severely dilated.   4. Right atrial size was severely dilated.   5. The mitral valve is grossly normal. Moderate mitral valve  regurgitation.   6. The aortic valve is tricuspid. Aortic valve regurgitation is not  visualized. Aortic valve sclerosis/calcification is present, without any  evidence of aortic stenosis.   7. The inferior vena cava is dilated in size with <50% respiratory  variability, suggesting right atrial pressure of 15 mmHg.   Comparison(s): Prior images reviewed side by side. LVEF has decreased in  comparison, approximately 25% at this point and with wall motion  abnormalities suggestive of ischemic cardiomyopathy. Moderate mitral  regurgitation. Moderate RV dysfunction.    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 Patient is a poor historian, denies any CP, but mentions  he was not feeling his normal self and had SOB. Serial trop 10232 --> 9832 --> 8473 --> 4576  - Cr >3, unable to consider for cardiac catheterization, manage medically. Transferred from [facility] to [place].   - restarted metoprolol at lower dose 12.'5mg'$  BID, HR well controlled. No chest pain.   - ASA, plavix and lipitor. Plan for DAPT for 1 month. Completed 48 hours of IV heparin.   CAD s/p CABG: previously had NSTEMI in 4/21, had drop in EF, but due to elevated SCr, he had myoview which showed large scar and minimal ischemia  ICM: EF 30-35%. Repeat echo EF 25%, moderate asymmetric LVH of basal segment, severe biatrial enlargement, RVSP 36.2 mmHg.   Orthostatic hypotension: patient only had  minimal dizziness upon standing, and able to walk to the bathroom with rolling walker this morning, however orthostatic vital sign from yesterday showed: Lying 106/69, Sitting 112/74, Standing 79/58. Will give metoprolol for now. Instructed nurse to repeat orthostatic vital sign today, if still has significant drop, may need to hold BB   PAF: burden 0.2% by last device interrogation. No anticoagulation due to prior GI bleed  Symptomatic bradycardia s/p PPM: followed by  Dr. Myles Gip  HTN  CKD: Cr near baseline 2.5  Hyponatremia: Na 129, related to COVID infection?      For questions or updates, please contact [place] Please consult [email or web address] for contact info under        Signed, Almyra Deforest, [place]  08/04/2022, 9:11 AM    History and all data above reviewed.  Patient examined.  I agree with the findings as above. No pain. Breathing is better but not back to baseline.  The patient exam reveals COR:RRR  ,  Lungs:   Decreased breath sounds left greater than right  ,  Abd: Positive bowel sounds, no rebound no guarding, Ext No edema   .  All available labs, radiology testing, previous records reviewed. Agree with documented assessment and plan. CAD/NSTEMI:  Started Plavix.  Again, long conversation with the patient and his daughter.  Plan non invasive evaluation at this time.  He wants to follow with Ledell Noss Domenic Polite if possible at discharge.)  Check orthostatics as above.    Jeneen Rinks Marcey Persad  10:44 AM  08/04/2022

## 2022-08-04 NOTE — Progress Notes (Signed)
  Transition of Care [facility]) Screening Note   Patient Details  Name: [name] Date of Birth: 06-Mar-1941   Transition of Care [facility]) CM/SW Contact:    Dawayne Patricia, RN Phone Number: 08/04/2022, 3:35 PM    Transition of Care Department [facility]) has reviewed patient and no TOC needs have been identified at this time. We will continue to monitor patient advancement through interdisciplinary progression rounds. If new patient transition needs arise, please place a TOC consult.

## 2022-08-05 DIAGNOSIS — I214 Non-ST elevation (NSTEMI) myocardial infarction: Secondary | ICD-10-CM | POA: Diagnosis not present

## 2022-08-05 LAB — COMPREHENSIVE METABOLIC PANEL
ALT: 39 U/L (ref 0–44)
AST: 64 U/L — ABNORMAL HIGH (ref 15–41)
Albumin: 2.6 g/dL — ABNORMAL LOW (ref 3.5–5.0)
Alkaline Phosphatase: 85 U/L (ref 38–126)
Anion gap: 10 (ref 5–15)
BUN: 83 mg/dL — ABNORMAL HIGH (ref 8–23)
CO2: 20 mmol/L — ABNORMAL LOW (ref 22–32)
Calcium: 8.4 mg/dL — ABNORMAL LOW (ref 8.9–10.3)
Chloride: 102 mmol/L (ref 98–111)
Creatinine, Ser: 2.56 mg/dL — ABNORMAL HIGH (ref 0.61–1.24)
GFR, Estimated: 24 mL/min — ABNORMAL LOW (ref >60)
Glucose, Bld: 265 mg/dL — ABNORMAL HIGH (ref 70–99)
Potassium: 3.8 mmol/L (ref 3.5–5.1)
Sodium: 132 mmol/L — ABNORMAL LOW (ref 135–145)
Total Bilirubin: 1 mg/dL (ref 0.3–1.2)
Total Protein: 6.2 g/dL — ABNORMAL LOW (ref 6.5–8.1)

## 2022-08-05 LAB — GLUCOSE, CAPILLARY
Glucose-Capillary: 197 mg/dL — ABNORMAL HIGH (ref 70–99)
Glucose-Capillary: 119 mg/dL — ABNORMAL HIGH (ref 70–99)
Glucose-Capillary: 299 mg/dL — ABNORMAL HIGH (ref 70–99)
Glucose-Capillary: 236 mg/dL — ABNORMAL HIGH (ref 70–99)

## 2022-08-05 MED ORDER — PREDNISONE 20 MG PO TABS
40.0000 mg | ORAL_TABLET | Freq: Every day | ORAL | Status: DC
  Administered 2022-08-06: 40 mg via ORAL
  Filled 2022-08-05 (×2): qty 2

## 2022-08-05 NOTE — Progress Notes (Signed)
Rounding Note    Patient Name: [name] Date of Encounter: 08/05/2022  [place] Cardiologist: None   Subjective   NAEO. Feeling like his breathing is slowly improving.  Inpatient Medications    Scheduled Meds:  vitamin C  500 mg Oral Daily   aspirin EC  81 mg Oral Daily   atorvastatin  40 mg Oral QHS   clopidogrel  75 mg Oral Daily   dextromethorphan-guaiFENesin  1 tablet Oral BID   febuxostat  40 mg Oral Daily   heparin injection (subcutaneous)  5,000 Units Subcutaneous Q8H   insulin aspart  0-15 Units Subcutaneous TID WC   insulin aspart  0-5 Units Subcutaneous QHS   methylPREDNISolone (SOLU-MEDROL) injection  40 mg Intravenous Daily   metoprolol tartrate  12.5 mg Oral BID   pantoprazole  40 mg Oral Daily   zinc sulfate  220 mg Oral Daily   Continuous Infusions:  PRN Meds: acetaminophen, albuterol, chlorpheniramine-HYDROcodone, guaiFENesin-dextromethorphan, prochlorperazine   Vital Signs    Vitals:   08/04/22 2326 08/05/22 0422 08/05/22 0642 08/05/22 0900  BP: 120/78 114/81  126/81  Pulse: 96 94  88  Resp: '19 20  19  '$ Temp: 98.3 F (36.8 C) 98.3 F (36.8 C)  97.6 F (36.4 C)  TempSrc: Axillary Oral  Oral  SpO2: 97% 95%  96%  Weight:   86.1 kg   Height:        Intake/Output Summary (Last 24 hours) at 08/05/2022 1151 Last data filed at 08/05/2022 0641 Gross per 24 hour  Intake 240 ml  Output 600 ml  Net -360 ml      08/05/2022    6:42 AM 08/04/2022    6:00 AM 08/01/2022   12:19 AM  Last 3 Weights  Weight (lbs) 189 lb 13.1 oz 189 lb 9.5 oz 197 lb 5 oz  Weight (kg) 86.1 kg 86 kg 89.5 kg      Telemetry    AF with rates around 100 - Personally Reviewed  ECG    Personally Reviewed  Physical Exam   GEN: No acute distress.  Elderly. Some mild IWOB.  Neck: No JVD Cardiac: irregularly irregular, no murmurs, rubs, or gallops.  Respiratory: Clear to auscultation bilaterally. GI: Soft, nontender, non-distended  MS: No edema; No  deformity. Neuro:  Nonfocal  Psych: Normal affect   Labs    High Sensitivity Troponin:   Recent Labs  Lab 08/01/22 0030 08/01/22 0242 08/01/22 0843 08/02/22 1833  TROPONINIHS 10,232* 4,196* 8,473* 4,576*     Chemistry Recent Labs  Lab 08/02/22 0254 08/02/22 1833 08/03/22 0114 08/04/22 0158 08/05/22 0128  NA 130*   < > 129* 127* 132*  K 3.6   < > 4.5 3.9 3.8  CL 99   < > 97* 99 102  CO2 20*   < > 20* 20* 20*  GLUCOSE 142*   < > 248* 227* 265*  BUN 58*   < > 61* 76* 83*  CREATININE 2.39*   < > 2.54* 2.52* 2.56*  CALCIUM 8.0*   < > 8.3* 8.3* 8.4*  MG 1.9  --  2.7* 2.5*  --   PROT 5.9*  --  6.6 5.9* 6.2*  ALBUMIN 2.6*  --  2.7* 2.6* 2.6*  AST 132*  --  114* 78* 64*  ALT 31  --  36 35 39  ALKPHOS 74  --  78 79 85  BILITOT 1.1  --  1.4* 1.1 1.0  GFRNONAA 27*   < >  25* 25* 24*  ANIONGAP 11   < > '12 8 10   '$ < > = values in this interval not displayed.    Lipids No results for input(s): "CHOL", "TRIG", "HDL", "LABVLDL", "LDLCALC", "CHOLHDL" in the last 168 hours.  Hematology Recent Labs  Lab 08/02/22 1833 08/03/22 0114 08/04/22 0158  WBC 6.3 4.3 7.1  RBC 4.37 4.34 4.25  HGB 12.2* 12.2* 11.9*  HCT 38.4* 37.1* 35.6*  MCV 87.9 85.5 83.8  MCH 27.9 28.1 28.0  MCHC 31.8 32.9 33.4  RDW 15.5 15.3 15.1  PLT 119* 118* 126*   Thyroid No results for input(s): "TSH", "FREET4" in the last 168 hours.  BNP Recent Labs  Lab 08/01/22 0030  BNP 1,428.0*    DDimer  Recent Labs  Lab 08/02/22 0254  DDIMER 2.40*     Radiology    No results found.    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 Low burden. Not on [facility] given history of GI bleeding.    For questions or updates, please contact [place] Please consult [email or web address] for contact info under        Signed, Vickie Epley, MD  08/05/2022, 11:51 AM

## 2022-08-05 NOTE — Progress Notes (Signed)
   08/05/22 0958  Orthostatic Lying   BP- Lying (!) 119/96  Pulse- Lying 101  Orthostatic Sitting  BP- Sitting (!) 117/91  Pulse- Sitting 111  Orthostatic Standing at 0 minutes  BP- Standing at 0 minutes 114/83  Pulse- Standing at 0 minutes 120  Orthostatic Standing at 3 minutes  BP- Standing at 3 minutes 114/88  Pulse- Standing at 3 minutes 114

## 2022-08-05 NOTE — Progress Notes (Signed)
[facility] PROGRESS NOTE  [name] (DOB: 1941/02/14) CHY:[id number] PCP: Monico Blitz, MD  Brief Narrative: [name] is an 82 y.o. male with a history of CAD s/p 4v CABG (2000), atrial fibrillation, SSS s/p PPM, T2DM, HTN, HLD, AAA, obesity who presented to the ED at [facility] on 08/01/2022 with 5 days of cough and an episode of passing out found to be in rapid atrial fibrillation with hypotension and +covid PCR. AKI (Cr 3.08 from baseline 2.2-2.7) noted with lactic acidosis. No pneumonia on CXR or hypoxemia. CT head nonacute. Troponin elevated to 10,232 for which IV heparin started, aspirin given, and IVF given with some improvement in hypotension. Given his duration of symptoms, no hypoxia nor infiltrate on CXR, antiviral therapy not initiated. The patient was transferred to [facility] with cardiology consulting, deferring angiography with elevated creatinine and no anginal symptoms. IV heparin given for NSTEMI in the setting of covid-19 infection. Steroids initiated.  Subjective: Feels stronger today, had better sleep with his own CPAP. No chest pain or dyspnea today, amenable to getting up more with therapy. Does not want to go home today. Has been wheezing.  Objective: BP 131/85 (BP Location: Right Arm)   Pulse 96   Temp 98.5 F (36.9 C) (Oral)   Resp 19   Ht '5\' 5"'$  (1.651 m)   Wt 86.1 kg   SpO2 94%   BMI 31.59 kg/m   Gen: No distress, chronically ill-appearing Pulm: Wheezes improved but still present, no crackles  CV: RRR, no MRG, trace edema GI: Soft, NT, ND, +BS  Neuro: Alert and essentially oriented. No new focal deficits. Ext: Warm, no deformities Skin: No rashes, lesions or ulcers on visualized skin   Assessment & Plan: Code status discussion: Goals of care remain aggressive/curative of what can be cured, but with baseline severe CKD and ischemic cardiomyopathy and COPD, the patient would be well served by having palliative follow up after discharge.  - Per discussion  with patient this morning, we will place order for full DNR.    NSTEMI on CAD: Known large scar on myoview previously. Troponin trending down. Tn elevation in pt w/known ICM, current AKI, and AFib with RVR due to covid infection.  - Continue ASA, added plavix as part of medical management, plan DAPT x1 month per cardiology. Troponin fortunately clearing and no evidence of angina. s/p heparin gtt x48 hrs.  - Continue beta blocker. Orthostatic hypotension seems to be improving, still slightly orthostatic today. - Continue statin.  - prn NTG (none needed currently). - Anticipate DC with home health therapies and outpatient palliative referral 1/7 if stable.   Syncope: Likely related to generalized weakness, though not completely clear. No concomitant device findings. Echo without new valvulopathy. Carotis U/S without hemodynamically significant stenosis.  - Continue cardiac monitoring - Check orthostatics again tomorrow.    Acute hypoxic respiratory failure due to AECOPD due to COVID-19 virus infection: CRP declining. No infiltrate on CXR. viral symptoms reported to start last week, thinks he caught this around Christmas time.  - Continue antitussives and supportive care.  - Gave IV steroids today, wheezing improved, transition to prednisone starting tomorrow.  - Continue nebs - This far out from symptoms, would not benefit from antiviral. Airborne isolation continues.  - Given thrombogenicity associated with covid, initiated [place] heparin for VTE ppx. - Incentive spirometry.     Lactic acidosis: Cleared  Chronic HFrEF: Holding lasix at this time.     SSS s/p PPM: Last remote device interrogation was  on 06/07/2022 and showed no events with lead parameters and battery within normal limits. - Having frequent PVCs, defer to cardiology, though not an ideal amiodarone candidate and doesn't appear currently to be symptomatic with this.    AKI on stage IV CKD: Mild acute impairment, returned to  baseline with CrCl stably < 27m/min.  - Avoid nephrotoxins. In absence of active chest pain, doubt CIN risk of angiography is justified.   PAF with RVR: Burden 0.2% on device interrogation. No anticoagulation due to hx GI bleed (though last report showed healed ulcer).  - Continue heparin [place] as above - Continue metoprolol, will consolidate to succinate prior to discharge.   HTN:  - Metoprolol  T2DM with hypoglycemia now with steroid-induced hyperglycemia: Last HbA1c in 2021 was 6.5%.  - Continue moderate SSI and HS coverage, will deescalate steroids as above.  HLD:  - Statin   Gout - Continue uloric (PTA med)    Thrombocytopenia: Likely due to viral infection, accentuated by hemodilution, has stabilized >100k. Will check intermittently.  AST elevation: Most likely due to viral infection, has continued improving.  AAA: Outpatient f/u  Hyponatremia: Improved without directed intervention.   Obesity: Body mass index is 31.59 kg/m.   RPatrecia Pour MD [facility] [email or web address] 08/05/2022, 2:20 PM

## 2022-08-05 NOTE — Progress Notes (Signed)
PT Cancellation Note  Patient Details Name: [name] MRN: [id number] DOB: [birth year]   Cancelled Treatment:    Reason Eval/Treat Not Completed: Other (comment). Family member at door reporting pt just got up to the bathroom and is now back in bed. She reported she did not believe he would like to work with PT today and declined for PT to check back later. Will plan to follow-up another day as able.   Moishe Spice, PT, DPT Acute Rehabilitation Services  Office: [place] 08/05/2022, 2:13 PM

## 2022-08-06 DIAGNOSIS — I214 Non-ST elevation (NSTEMI) myocardial infarction: Secondary | ICD-10-CM | POA: Diagnosis not present

## 2022-08-06 LAB — GLUCOSE, CAPILLARY: Glucose-Capillary: 111 mg/dL — ABNORMAL HIGH (ref 70–99)

## 2022-08-06 MED ORDER — METOPROLOL SUCCINATE ER 25 MG PO TB24: 25.0000 mg | ORAL_TABLET | Freq: Every day | ORAL | Status: DC

## 2022-08-06 MED ORDER — CLOPIDOGREL BISULFATE 75 MG PO TABS: 75.0000 mg | ORAL_TABLET | Freq: Every day | ORAL | 0 refills | Status: AC

## 2022-08-06 MED ORDER — GUAIFENESIN-DM 100-10 MG/5ML PO SYRP: 10.0000 mL | ORAL_SOLUTION | ORAL | 0 refills | Status: AC | PRN

## 2022-08-06 MED ORDER — PREDNISONE 10 MG PO TABS: ORAL_TABLET | ORAL | 0 refills | Status: AC

## 2022-08-06 MED ORDER — METOPROLOL SUCCINATE ER 25 MG PO TB24: 12.5000 mg | ORAL_TABLET | Freq: Every day | ORAL | 0 refills | Status: AC

## 2022-08-06 NOTE — Discharge Summary (Signed)
Physician Discharge Summary   Patient: [name] MRN: [id number] DOB: 1940-12-29  Admit date:     08/01/2022  Discharge date: 08/06/22  Discharge Physician: Patrecia Pour   PCP: Monico Blitz, MD   Recommendations at discharge:  Follow up with PCP in 1-2 weeks with recheck BMP, CBC.  Follow up with outpatient palliative care Follow up with cardiology after discharge.   Discharge Diagnoses: Principal Problem:   NSTEMI (non-ST elevated myocardial infarction) ([place]) Active Problems:   Essential hypertension, benign   SSS (sick sinus syndrome) ([place])   Uncontrolled type 2 diabetes mellitus with hypoglycemia, without long-term current use of insulin (HCC)   Acute kidney injury superimposed on chronic kidney disease (HCC)   GERD (gastroesophageal reflux disease)   Syncope   COVID-19 virus infection   Lactic acidosis   Elevated brain natriuretic peptide (BNP) level   Gout   Thrombocytopenia (HCC)   Obesity (BMI 30-39.9)  Hospital Course: [name] is an 82 y.o. male with a history of CAD s/p 4v CABG (2000), atrial fibrillation, SSS s/p PPM, T2DM, HTN, HLD, AAA, obesity who presented to the ED at [facility] on 08/01/2022 with 5 days of cough and an episode of passing out found to be in rapid atrial fibrillation with hypotension and +covid PCR. AKI (Cr 3.08 from baseline 2.2-2.7) noted with lactic acidosis. No pneumonia on CXR or hypoxemia. CT head nonacute. Troponin elevated to 10,232 for which IV heparin started, aspirin given, and IVF given with some improvement in hypotension. Given his duration of symptoms, no hypoxia nor infiltrate on CXR, antiviral therapy not initiated. The patient was transferred to [facility] with cardiology consulting, deferring angiography with elevated creatinine and no anginal symptoms. IV heparin given for NSTEMI in the setting of covid-19 infection. Steroids initiated with improvement in respiratory status. He has been hemodynamically stable without anginal symptoms, and  will be discharged in improved condition to home with home health and outpatient palliative care referral. Please see details below.   Assessment and Plan: Code status discussion: Goals of care remain aggressive/curative of what can be cured, but with baseline severe CKD and ischemic cardiomyopathy and COPD, the patient would be well served by having palliative follow up after discharge.  - Per discussion with patient, he is considered a full DNR.     NSTEMI on CAD: Known large scar on myoview previously. Troponin trending down. Tn elevation in pt w/known ICM, current AKI, and AFib with RVR due to covid infection.  - Continue ASA, added plavix as part of medical management, plan DAPT x1 month per cardiology. Troponin fortunately clearing and no evidence of angina. s/p heparin gtt x48 hrs.  - Continue beta blocker. Orthostatic hypotension seems to be improving and resolved on day of discharge. - Continue statin.  - prn NTG (none needed currently).   Syncope: Likely related to generalized weakness, though not completely clear. No concomitant device findings. Echo without new valvulopathy. Carotis U/S without hemodynamically significant stenosis.     Acute hypoxic respiratory failure due to AECOPD due to COVID-19 virus infection: CRP declining. No infiltrate on CXR. viral symptoms reported to start last week, thinks he caught this around Christmas time.  - Continue antitussives and supportive care.  - Complete steroid taper after discharge. - Continue nebs - This far out from symptoms, would not benefit from antiviral. Isolation reviewed with patient.  - Incentive spirometry.     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  - Restart lasix per  cardiology. Recheck labs in 1-2 weeks. - GDMT otherwise limited by CKD.    SSS s/p PPM: Last remote device interrogation was on 06/07/2022 and showed no events with lead parameters and battery within normal limits. - Having frequent PVCs,  defer to cardiology, though not an ideal amiodarone candidate and doesn't appear currently to be symptomatic with this.    AKI on stage IV CKD: Mild acute impairment, returned to baseline with CrCl stably < 41m/min.  - Avoid nephrotoxins. In absence of active chest pain, doubt CIN risk of angiography is justified.    PAF with RVR: Burden 0.2% on device interrogation. No anticoagulation due to hx GI bleed (though last report showed healed ulcer).  - Continue heparin [place] as above - Continue metoprolol, will consolidate to succinate prior to discharge.   HTN:  - Metoprolol   T2DM with hypoglycemia now with steroid-induced hyperglycemia: Last HbA1c in 2021 was 6.5%.  - Continue moderate SSI and HS coverage, will deescalate steroids as above.   HLD:  - Statin   Gout - Continue uloric (PTA med)    Thrombocytopenia: Likely due to viral infection, accentuated by hemodilution, has stabilized >100k. Will check intermittently.   AST elevation: Most likely due to viral infection, has continued improving.   AAA: Outpatient f/u   Hyponatremia: Improved without directed intervention.   Obesity: Body mass index is 31.59 kg/m.   Consultants: Cardiology Procedures performed: None  Disposition: Home Diet recommendation:  Discharge Diet Orders (From admission, onward)     Start     Ordered   08/06/22 0000  Diet - low sodium heart healthy        08/06/22 1024           Carb modified diet DISCHARGE MEDICATION: Allergies as of 08/06/2022   No Known Allergies      Medication List     TAKE these medications    acetaminophen 500 MG tablet Commonly known as: TYLENOL Take 1,000 mg by mouth every 6 (six) hours as needed for moderate pain or headache.   aspirin EC 81 MG tablet Take 1 tablet (81 mg total) by mouth daily. Restart in 2 weeks   atorvastatin 40 MG tablet Commonly known as: LIPITOR Take 40 mg by mouth at bedtime.   clopidogrel 75 MG tablet Commonly known as:  PLAVIX Take 1 tablet (75 mg total) by mouth daily. Start taking on: August 07, 2022   cyclobenzaprine 10 MG tablet Commonly known as: FLEXERIL Take 10 mg by mouth at bedtime.   febuxostat 40 MG tablet Commonly known as: ULORIC Take 40 mg by mouth daily.   furosemide 40 MG tablet Commonly known as: LASIX TAKE 1 TABLET BY MOUTH EVERY DAY   glimepiride 4 MG tablet Commonly known as: AMARYL Take 4 mg by mouth daily.   guaiFENesin-dextromethorphan 100-10 MG/5ML syrup Commonly known as: ROBITUSSIN DM Take 10 mLs by mouth every 4 (four) hours as needed for cough.   metoprolol succinate 25 MG 24 hr tablet Commonly known as: TOPROL-XL Take 0.5 tablets (12.5 mg total) by mouth daily. Take with or immediately following a meal. What changed:  medication strength how much to take   nitroGLYCERIN 0.4 MG SL tablet Commonly known as: NITROSTAT Place 0.4 mg under the tongue every 5 (five) minutes as needed for chest pain.   pantoprazole 40 MG tablet Commonly known as: PROTONIX TAKE 1 TABLET BY MOUTH EVERY DAY BEFORE BREAKFAST   predniSONE 10 MG tablet Commonly known as: DELTASONE Take 2 tablets (  20 mg total) by mouth daily with breakfast for 2 days, THEN 1 tablet (10 mg total) daily with breakfast for 2 days. Start taking on: August 07, 2022   traZODone 50 MG tablet Commonly known as: DESYREL Take 50 mg by mouth at bedtime as needed for sleep.        Follow-up Information     Monico Blitz, MD Follow up.   Specialty: Internal Medicine Contact information: [place] [address] [phone]         Bensimhon, Shaune Pascal, MD Follow up.   Specialty: Cardiology Contact information: [address] [place] [address] [phone]                Discharge Exam: Danley Danker Weights   08/04/22 0600 08/05/22 0642 08/06/22 0640  Weight: 86 kg 86.1 kg 83.6 kg  BP (!) 123/90 (BP Location: Right Arm)   Pulse 94   Temp 98.4 F (36.9 C) (Oral)    Resp 20   Ht '5\' 5"'$  (1.651 m)   Wt 83.6 kg   SpO2 92%   BMI 30.67 kg/m   No distress, well-appearing in good spirits wants to go home today Cleared crackles and wheezes, nonlabored on room air RRR, no pitting edema or JVD Soft, NT, ND, +BS Alert, oriented  Condition at discharge: stable  The results of significant diagnostics from this hospitalization (including imaging, microbiology, ancillary and laboratory) are listed below for reference.   Imaging Studies: ECHOCARDIOGRAM COMPLETE  Result Date: 08/01/2022    ECHOCARDIOGRAM REPORT   Patient Name:   ORVIS STANN Date of Exam: 08/01/2022 Medical Rec #:  [id number]       Height:       65.0 in Accession #:    [id number]      Weight:       197.3 lb Date of Birth:  06/15/1941        BSA:          1.967 m Patient Age:    35 years        BP:           110/80 mmHg Patient Gender: M               HR:           89 bpm. Exam Location:  [facility] Procedure: 2D Echo, Cardiac Doppler and Color Doppler Indications:    CHF-Acute Diastolic J67.34  History:        Patient has prior history of Echocardiogram examinations, most                 recent 11/25/2019. Previous Myocardial Infarction and CAD,                 Pacemaker, COPD, Arrythmias:Atrial Fibrillation; Risk                 Factors:Hypertension, Diabetes, Dyslipidemia and Former Smoker.                 COVID-19 virus infection, SSS (sick sinus syndrome) ([place]), HFrEF                 (heart failure with reduced ejection fraction) (HCC) (From Hx).  Sonographer:    Alvino Chapel RCS Referring Phys: [id number] OLADAPO ADEFESO IMPRESSIONS  1. Left ventricular ejection fraction, by estimation, is approximately 25%. The left ventricle has severely decreased function. The left ventricle demonstrates regional wall motion abnormalities (see scoring diagram/findings for description). There is  moderate asymmetric left ventricular hypertrophy of the basal segment. Left ventricular diastolic parameters are indeterminate.   2. Right ventricular systolic function is moderately reduced. The right ventricular size is normal. There is mildly elevated pulmonary artery systolic pressure. The estimated right ventricular systolic pressure is 67.6 mmHg.  3. Left atrial size was severely dilated.  4. Right atrial size was severely dilated.  5. The mitral valve is grossly normal. Moderate mitral valve regurgitation.  6. The aortic valve is tricuspid. Aortic valve regurgitation is not visualized. Aortic valve sclerosis/calcification is present, without any evidence of aortic stenosis.  7. The inferior vena cava is dilated in size with <50% respiratory variability, suggesting right atrial pressure of 15 mmHg. Comparison(s): Prior images reviewed side by side. LVEF has decreased in comparison, approximately 25% at this point and with wall motion abnormalities suggestive of ischemic cardiomyopathy. Moderate mitral regurgitation. Moderate RV dysfunction. FINDINGS  Left Ventricle: Left ventricular ejection fraction, by estimation, is 25%. The left ventricle has severely decreased function. The left ventricle demonstrates regional wall motion abnormalities. The left ventricular internal cavity size was normal in size. There is moderate asymmetric left ventricular hypertrophy of the basal segment. Left ventricular diastolic function could not be evaluated due to atrial fibrillation. Left ventricular diastolic parameters are indeterminate.  LV Wall Scoring: The antero-lateral wall, entire inferior wall, and posterior wall are akinetic. The anterior wall, mid inferoseptal segment, and basal inferoseptal segment are hypokinetic. The entire anterior septum, apical lateral segment, apical anterior segment, and apex are normal. Right Ventricle: The right ventricular size is normal. No increase in right ventricular wall thickness. Right ventricular systolic function is moderately reduced. There is mildly elevated pulmonary artery systolic pressure. The  tricuspid regurgitant velocity is 2.30 m/s, and with an assumed right atrial pressure of 15 mmHg, the estimated right ventricular systolic pressure is 19.5 mmHg. Left Atrium: Left atrial size was severely dilated. Right Atrium: Right atrial size was severely dilated. Pericardium: There is no evidence of pericardial effusion. Mitral Valve: The mitral valve is grossly normal. Moderate mitral valve regurgitation, with centrally-directed jet. Tricuspid Valve: The tricuspid valve is grossly normal. Tricuspid valve regurgitation is mild. Aortic Valve: The aortic valve is tricuspid. Aortic valve regurgitation is not visualized. Aortic valve sclerosis/calcification is present, without any evidence of aortic stenosis. Pulmonic Valve: The pulmonic valve was grossly normal. Pulmonic valve regurgitation is trivial. Aorta: The aortic root is normal in size and structure. Venous: The inferior vena cava is dilated in size with less than 50% respiratory variability, suggesting right atrial pressure of 15 mmHg. IAS/Shunts: No atrial level shunt detected by color flow Doppler. Additional Comments: A device lead is visualized.  LEFT VENTRICLE PLAX 2D LVIDd:         5.40 cm LVIDs:         4.80 cm LV PW:         1.20 cm LV IVS:        1.60 cm LVOT diam:     1.90 cm LV SV:         32 LV SV Index:   16 LVOT Area:     2.84 cm  LV Volumes (MOD) LV vol d, MOD A2C: 176.0 ml LV vol d, MOD A4C: 206.0 ml LV vol s, MOD A2C: 127.0 ml LV vol s, MOD A4C: 145.0 ml LV SV MOD A2C:     49.0 ml LV SV MOD A4C:     206.0 ml LV SV MOD BP:      59.7  ml RIGHT VENTRICLE TAPSE (M-mode): 2.0 cm LEFT ATRIUM              Index        RIGHT ATRIUM           Index LA diam:        4.60 cm  2.34 cm/m   RA Area:     31.90 cm LA Vol (A2C):   90.0 ml  45.76 ml/m  RA Volume:   119.00 ml 60.50 ml/m LA Vol (A4C):   101.0 ml 51.35 ml/m LA Biplane Vol: 97.2 ml  49.42 ml/m  AORTIC VALVE LVOT Vmax:   71.13 cm/s LVOT Vmean:  45.867 cm/s LVOT VTI:    0.114 m  AORTA Ao Root  diam: 3.70 cm MITRAL VALVE                  TRICUSPID VALVE MV Area (PHT): 5.54 cm       TR Peak grad:   21.2 mmHg MV Decel Time: 137 msec       TR Vmax:        230.00 cm/s MR Peak grad:    90.6 mmHg MR Mean grad:    54.0 mmHg    SHUNTS MR Vmax:         476.00 cm/s  Systemic VTI:  0.11 m MR Vmean:        340.0 cm/s   Systemic Diam: 1.90 cm MR PISA:         2.26 cm MR PISA Eff ROA: 15 mm MR PISA Radius:  0.60 cm MV E velocity: 110.00 cm/s Rozann Lesches MD Electronically signed by Rozann Lesches MD Signature Date/Time: 08/01/2022/12:27:39 PM    Final    US Carotid Bilateral  Result Date: 08/01/2022 CLINICAL DATA:  82 year old male with a history of syncope EXAM: BILATERAL CAROTID DUPLEX ULTRASOUND TECHNIQUE: Pearline Cables scale imaging, color Doppler and duplex ultrasound were performed of bilateral carotid and vertebral arteries in the neck. COMPARISON:  None Available. FINDINGS: Criteria: Quantification of carotid stenosis is based on velocity parameters that correlate the residual internal carotid diameter with NASCET-based stenosis levels, using the diameter of the distal internal carotid lumen as the denominator for stenosis measurement. The following velocity measurements were obtained: RIGHT ICA:  Systolic 87 cm/sec, Diastolic 30 cm/sec CCA:  90 cm/sec SYSTOLIC ICA/CCA RATIO:  1.0 ECA:  71 cm/sec LEFT ICA:  Systolic 628 cm/sec, Diastolic 35 cm/sec CCA:  73 cm/sec SYSTOLIC ICA/CCA RATIO:  1.4 ECA:  101 cm/sec Right Brachial SBP: Not acquired Left Brachial SBP: Not acquired RIGHT CAROTID ARTERY: No significant calcifications of the right common carotid artery. Intermediate waveform maintained. Moderate heterogeneous and partially calcified plaque at the right carotid bifurcation. Lumen shadowing. Low resistance waveform of the right ICA. No significant tortuosity. RIGHT VERTEBRAL ARTERY: No flow identified in the right vertebral artery. LEFT CAROTID ARTERY: No significant calcifications of the left common carotid  artery. Intermediate waveform maintained. Moderate heterogeneous and partially calcified plaque at the left carotid bifurcation. Lumen shadowing. Low resistance waveform of the left ICA. No significant tortuosity. LEFT VERTEBRAL ARTERY:  Antegrade flow with low resistance waveform. IMPRESSION: Color duplex indicates moderate heterogeneous and calcified plaque, with no hemodynamically significant stenosis by duplex criteria in the extracranial cerebrovascular circulation. No flow identified in the right vertebral artery. Signed, Dulcy Fanny. Nadene Rubins, RPVI [facility] Electronically Signed   By: Corrie Mckusick D.O.   On: 08/01/2022 09:54   CT Head  Wo Contrast  Result Date: 08/01/2022 CLINICAL DATA:  Fall EXAM: CT HEAD WITHOUT CONTRAST TECHNIQUE: Contiguous axial images were obtained from the base of the skull through the vertex without intravenous contrast. RADIATION DOSE REDUCTION: This exam was performed according to the departmental dose-optimization program which includes automated exposure control, adjustment of the mA and/or kV according to patient size and/or use of iterative reconstruction technique. COMPARISON:  None Available. FINDINGS: Brain: There is no mass, hemorrhage or extra-axial collection. There is periventricular hypoattenuation compatible with chronic microvascular disease. Vascular: Atherosclerotic calcification of the basilar artery and internal carotid arteries. Skull: Normal Sinuses/Orbits: Paranasal sinuses are clear.  Normal orbits. Other: None IMPRESSION: 1. No acute intracranial abnormality. 2. Chronic microvascular disease. Electronically Signed   By: Ulyses Jarred M.D.   On: 08/01/2022 01:27   DG Chest 2 View  Result Date: 08/01/2022 CLINICAL DATA:  Near syncope. EXAM: CHEST - 2 VIEW COMPARISON:  November 28, 2019 FINDINGS: There is a dual lead AICD. Multiple sternal wires and vascular clips are noted. The cardiac  silhouette is moderately enlarged and unchanged in size. Mild atelectasis is seen within the bilateral lung bases. There is no evidence of a pleural effusion or pneumothorax. Postoperative changes are again seen throughout the mid and lower thoracic spine. IMPRESSION: 1. Stable cardiomegaly with mild bibasilar atelectasis. 2. Evidence of prior median sternotomy/CABG. Electronically Signed   By: Virgina Norfolk M.D.   On: 08/01/2022 01:22    Microbiology: Results for orders placed or performed during the hospital encounter of 08/01/22  Resp panel by RT-PCR (RSV, Flu A&B, Covid) Anterior Nasal Swab     Status: Abnormal   Collection Time: 08/01/22  1:53 AM   Specimen: Anterior Nasal Swab  Result Value Ref Range Status   SARS Coronavirus 2 by RT PCR POSITIVE (A) NEGATIVE Final    Comment: (NOTE) SARS-CoV-2 target nucleic acids are DETECTED.  The SARS-CoV-2 RNA is generally detectable in upper respiratory specimens during the acute phase of infection. Positive results are indicative of the presence of the identified virus, but do not rule out bacterial infection or co-infection with other pathogens not detected by the test. Clinical correlation with patient history and other diagnostic information is necessary to determine patient infection status. The expected result is Negative.  Fact Sheet for Patients: EntrepreneurPulse.com.au  Fact Sheet for Healthcare Providers: IncredibleEmployment.be  This test is not yet approved or cleared by the [place] FDA and  has been authorized for detection and/or diagnosis of SARS-CoV-2 by FDA under an Emergency Use Authorization (EUA).  This EUA will remain in effect (meaning this test can be used) for the duration of  the COVID-19 declaration under Section 564(b)(1) of the A ct, 21 U.S.C. section 360bbb-3(b)(1), unless the authorization is terminated or revoked sooner.     Influenza A by PCR NEGATIVE NEGATIVE  Final   Influenza B by PCR NEGATIVE NEGATIVE Final    Comment: (NOTE) The Xpert Xpress SARS-CoV-2/FLU/RSV plus assay is intended as an aid in the diagnosis of influenza from Nasopharyngeal swab specimens and should not be used as a sole basis for treatment. Nasal washings and aspirates are unacceptable for Xpert Xpress SARS-CoV-2/FLU/RSV testing.  Fact Sheet for Patients: EntrepreneurPulse.com.au  Fact Sheet for Healthcare Providers: IncredibleEmployment.be  This test is not yet approved or cleared by the [place] FDA and has been authorized for detection and/or diagnosis of SARS-CoV-2 by FDA under an Emergency Use Authorization (EUA). This EUA will remain in effect (meaning this test can be  used) for the duration of the COVID-19 declaration under Section 564(b)(1) of the Act, 21 U.S.C. section 360bbb-3(b)(1), unless the authorization is terminated or revoked.     Resp Syncytial Virus by PCR NEGATIVE NEGATIVE Final    Comment: (NOTE) Fact Sheet for Patients: EntrepreneurPulse.com.au  Fact Sheet for Healthcare Providers: IncredibleEmployment.be  This test is not yet approved or cleared by the [place] FDA and has been authorized for detection and/or diagnosis of SARS-CoV-2 by FDA under an Emergency Use Authorization (EUA). This EUA will remain in effect (meaning this test can be used) for the duration of the COVID-19 declaration under Section 564(b)(1) of the Act, 21 U.S.C. section 360bbb-3(b)(1), unless the authorization is terminated or revoked.  Performed at [facility], [address] [place], [place] [address]     Labs: CBC: Recent Labs  Lab 08/01/22 0030 08/02/22 0254 08/02/22 1833 08/03/22 0114 08/04/22 0158  WBC 11.1* 6.1 6.3 4.3 7.1  NEUTROABS 9.9* 4.8  --  4.1 6.5  HGB 14.3 11.7* 12.2* 12.2* 11.9*  HCT 44.3 36.9* 38.4* 37.1* 35.6*  MCV 86.5 87.4 87.9 85.5 83.8  PLT 140*  118* 119* 118* 299*   Basic Metabolic Panel: Recent Labs  Lab 08/02/22 0254 08/02/22 1833 08/03/22 0114 08/04/22 0158 08/05/22 0128  NA 130* 127* 129* 127* 132*  K 3.6 4.2 4.5 3.9 3.8  CL 99 98 97* 99 102  CO2 20* 18* 20* 20* 20*  GLUCOSE 142* 177* 248* 227* 265*  BUN 58* 54* 61* 76* 83*  CREATININE 2.39* 2.38* 2.54* 2.52* 2.56*  CALCIUM 8.0* 8.1* 8.3* 8.3* 8.4*  MG 1.9  --  2.7* 2.5*  --   PHOS 3.7  --  4.3 3.8  --    Liver Function Tests: Recent Labs  Lab 08/01/22 0030 08/02/22 0254 08/03/22 0114 08/04/22 0158 08/05/22 0128  AST 69* 132* 114* 78* 64*  ALT 19 31 36 35 39  ALKPHOS 96 74 78 79 85  BILITOT 1.8* 1.1 1.4* 1.1 1.0  PROT 7.5 5.9* 6.6 5.9* 6.2*  ALBUMIN 3.4* 2.6* 2.7* 2.6* 2.6*   CBG: Recent Labs  Lab 08/05/22 0632 08/05/22 1153 08/05/22 1656 08/05/22 2103 08/06/22 0634  GLUCAP 197* 119* 299* 236* 111*    Discharge time spent: greater than 30 minutes.  Signed: Patrecia Pour, MD [facility] 08/06/2022

## 2022-08-06 NOTE — Progress Notes (Signed)
Rounding Note    Patient Name: [name] Date of Encounter: 08/06/2022  [place] Cardiologist: None   Subjective   NAEO. Family at bedside.  Feeling better today.  Inpatient Medications    Scheduled Meds:  vitamin C  500 mg Oral Daily   aspirin EC  81 mg Oral Daily   atorvastatin  40 mg Oral QHS   clopidogrel  75 mg Oral Daily   dextromethorphan-guaiFENesin  1 tablet Oral BID   febuxostat  40 mg Oral Daily   heparin injection (subcutaneous)  5,000 Units Subcutaneous Q8H   insulin aspart  0-15 Units Subcutaneous TID WC   insulin aspart  0-5 Units Subcutaneous QHS   metoprolol succinate  25 mg Oral QHS   pantoprazole  40 mg Oral Daily   predniSONE  40 mg Oral Q breakfast   zinc sulfate  220 mg Oral Daily   Continuous Infusions:  PRN Meds: acetaminophen, albuterol, chlorpheniramine-HYDROcodone, guaiFENesin-dextromethorphan, prochlorperazine   Vital Signs    Vitals:   08/05/22 2332 08/06/22 0437 08/06/22 0640 08/06/22 0919  BP: 123/87 122/81  (!) 123/90  Pulse: 88 83  94  Resp: '15 19  20  '$ Temp: 98.4 F (36.9 C) 98.6 F (37 C)  98.4 F (36.9 C)  TempSrc: Oral Axillary  Oral  SpO2: 96% 94%  92%  Weight:   83.6 kg   Height:        Intake/Output Summary (Last 24 hours) at 08/06/2022 1259 Last data filed at 08/06/2022 0920 Gross per 24 hour  Intake 221 ml  Output 725 ml  Net -504 ml       08/06/2022    6:40 AM 08/05/2022    6:42 AM 08/04/2022    6:00 AM  Last 3 Weights  Weight (lbs) 184 lb 4.9 oz 189 lb 13.1 oz 189 lb 9.5 oz  Weight (kg) 83.6 kg 86.1 kg 86 kg      Telemetry    AF with rates around 80s - Personally Reviewed  ECG    Personally Reviewed  Physical Exam   GEN: No acute distress.  Elderly. Some mild IWOB.  Neck: No JVD Cardiac: irregularly irregular, no murmurs, rubs, or gallops.  Respiratory: Clear to auscultation bilaterally. GI: Soft, nontender, non-distended  MS: No edema; No deformity. Neuro:  Nonfocal  Psych:  Normal affect   Labs    High Sensitivity Troponin:   Recent Labs  Lab 08/01/22 0030 08/01/22 0242 08/01/22 0843 08/02/22 1833  TROPONINIHS 10,232* 8,841* 8,473* 4,576*      Chemistry Recent Labs  Lab 08/02/22 0254 08/02/22 1833 08/03/22 0114 08/04/22 0158 08/05/22 0128  NA 130*   < > 129* 127* 132*  K 3.6   < > 4.5 3.9 3.8  CL 99   < > 97* 99 102  CO2 20*   < > 20* 20* 20*  GLUCOSE 142*   < > 248* 227* 265*  BUN 58*   < > 61* 76* 83*  CREATININE 2.39*   < > 2.54* 2.52* 2.56*  CALCIUM 8.0*   < > 8.3* 8.3* 8.4*  MG 1.9  --  2.7* 2.5*  --   PROT 5.9*  --  6.6 5.9* 6.2*  ALBUMIN 2.6*  --  2.7* 2.6* 2.6*  AST 132*  --  114* 78* 64*  ALT 31  --  36 35 39  ALKPHOS 74  --  78 79 85  BILITOT 1.1  --  1.4* 1.1 1.0  GFRNONAA 27*   < >  25* 25* 24*  ANIONGAP 11   < > '12 8 10   '$ < > = values in this interval not displayed.     Lipids No results for input(s): "CHOL", "TRIG", "HDL", "LABVLDL", "LDLCALC", "CHOLHDL" in the last 168 hours.  Hematology Recent Labs  Lab 08/02/22 1833 08/03/22 0114 08/04/22 0158  WBC 6.3 4.3 7.1  RBC 4.37 4.34 4.25  HGB 12.2* 12.2* 11.9*  HCT 38.4* 37.1* 35.6*  MCV 87.9 85.5 83.8  MCH 27.9 28.1 28.0  MCHC 31.8 32.9 33.4  RDW 15.5 15.3 15.1  PLT 119* 118* 126*    Thyroid No results for input(s): "TSH", "FREET4" in the last 168 hours.  BNP Recent Labs  Lab 08/01/22 0030  BNP 1,428.0*     DDimer  Recent Labs  Lab 08/02/22 0254  DDIMER 2.40*      Radiology    No results found.    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  #Bradycardia s/p PPM PPM functioning appropriately, continue remote monitoring  #AF Paroxysmal. Low burden. Not on [facility] given history of GI bleeding.  OK to discharge from a cardiovascular perspective.   For questions or updates, please contact [place] Please consult [email or web address] for contact info under        Signed, Vickie Epley, MD  08/06/2022, 12:59 PM

## 2022-08-06 NOTE — TOC Transition Note (Signed)
Transition of Care [facility]) - CM/SW Discharge Note   Patient Details  Name: [name] MRN: [id number] Date of Birth: Mar 11, 1941  Transition of Care [facility]) CM/SW Contact:  Bartholomew Crews, RN Phone Number: [phone] 08/06/2022, 11:50 AM   Clinical Narrative:     Spoke with patient's spouse on home phone to discuss post acute transition. She is ready for his return today. [name] will provide transportation home. Agreeable to [facility] services - has used [facility] previously and would like to use them again if possible. Referral sent to [facility]. No further TOC needs identified at this time.   Final next level of care: Home w Home Health Services Barriers to Discharge: No Barriers Identified   Patient Goals and CMS Choice CMS Medicare.gov Compare Post Acute Care list provided to:: Patient Represenative (must comment) (spouse) Choice offered to / list presented to : Spouse  Discharge Placement                         Discharge Plan and Services Additional resources added to the After Visit Summary for                  DME Arranged: N/A DME Agency: NA       HH Arranged: RN, PT, OT, Nurse's Aide [place] Agency: [place] ([facility]) Date HH Agency Contacted: 08/06/22 Time [place]: 1149 Representative spoke with at [place]: [place] ([place]) Interventions SDOH Screenings   Tobacco Use: Medium Risk (08/01/2022)     Readmission Risk Interventions    11/28/2019   11:15 AM  Readmission Risk Prevention Plan  Transportation Screening Complete  HRI or [place] Complete  Social Work Consult for [place] Planning/Counseling Complete  Palliative Care Screening Not Applicable  Medication Review Press photographer) Complete

## 2022-08-08 DIAGNOSIS — M4800 Spinal stenosis, site unspecified: Secondary | ICD-10-CM | POA: Diagnosis not present

## 2022-08-08 DIAGNOSIS — I2510 Atherosclerotic heart disease of native coronary artery without angina pectoris: Secondary | ICD-10-CM | POA: Diagnosis not present

## 2022-08-08 DIAGNOSIS — I5022 Chronic systolic (congestive) heart failure: Secondary | ICD-10-CM | POA: Diagnosis not present

## 2022-08-08 DIAGNOSIS — I130 Hypertensive heart and chronic kidney disease with heart failure and stage 1 through stage 4 chronic kidney disease, or unspecified chronic kidney disease: Secondary | ICD-10-CM | POA: Diagnosis not present

## 2022-08-08 DIAGNOSIS — U071 COVID-19: Secondary | ICD-10-CM | POA: Diagnosis not present

## 2022-08-08 DIAGNOSIS — I495 Sick sinus syndrome: Secondary | ICD-10-CM | POA: Diagnosis not present

## 2022-08-08 DIAGNOSIS — Z7902 Long term (current) use of antithrombotics/antiplatelets: Secondary | ICD-10-CM | POA: Diagnosis not present

## 2022-08-08 DIAGNOSIS — J449 Chronic obstructive pulmonary disease, unspecified: Secondary | ICD-10-CM | POA: Diagnosis not present

## 2022-08-08 DIAGNOSIS — E782 Mixed hyperlipidemia: Secondary | ICD-10-CM | POA: Diagnosis not present

## 2022-08-08 DIAGNOSIS — L89322 Pressure ulcer of left buttock, stage 2: Secondary | ICD-10-CM | POA: Diagnosis not present

## 2022-08-08 DIAGNOSIS — I714 Abdominal aortic aneurysm, without rupture, unspecified: Secondary | ICD-10-CM | POA: Diagnosis not present

## 2022-08-08 DIAGNOSIS — E11649 Type 2 diabetes mellitus with hypoglycemia without coma: Secondary | ICD-10-CM | POA: Diagnosis not present

## 2022-08-08 DIAGNOSIS — G4733 Obstructive sleep apnea (adult) (pediatric): Secondary | ICD-10-CM | POA: Diagnosis not present

## 2022-08-08 DIAGNOSIS — D696 Thrombocytopenia, unspecified: Secondary | ICD-10-CM | POA: Diagnosis not present

## 2022-08-08 DIAGNOSIS — E1122 Type 2 diabetes mellitus with diabetic chronic kidney disease: Secondary | ICD-10-CM | POA: Diagnosis not present

## 2022-08-08 DIAGNOSIS — M1030 Gout due to renal impairment, unspecified site: Secondary | ICD-10-CM | POA: Diagnosis not present

## 2022-08-08 DIAGNOSIS — I214 Non-ST elevation (NSTEMI) myocardial infarction: Secondary | ICD-10-CM | POA: Diagnosis not present

## 2022-08-08 DIAGNOSIS — N179 Acute kidney failure, unspecified: Secondary | ICD-10-CM | POA: Diagnosis not present

## 2022-08-08 DIAGNOSIS — C61 Malignant neoplasm of prostate: Secondary | ICD-10-CM | POA: Diagnosis not present

## 2022-08-08 DIAGNOSIS — E669 Obesity, unspecified: Secondary | ICD-10-CM | POA: Diagnosis not present

## 2022-08-08 DIAGNOSIS — Z9181 History of falling: Secondary | ICD-10-CM | POA: Diagnosis not present

## 2022-08-08 DIAGNOSIS — N184 Chronic kidney disease, stage 4 (severe): Secondary | ICD-10-CM | POA: Diagnosis not present

## 2022-08-08 DIAGNOSIS — I4821 Permanent atrial fibrillation: Secondary | ICD-10-CM | POA: Diagnosis not present

## 2022-08-08 DIAGNOSIS — Z7984 Long term (current) use of oral hypoglycemic drugs: Secondary | ICD-10-CM | POA: Diagnosis not present

## 2022-08-08 DIAGNOSIS — M1990 Unspecified osteoarthritis, unspecified site: Secondary | ICD-10-CM | POA: Diagnosis not present

## 2022-08-17 ENCOUNTER — Ambulatory Visit (HOSPITAL_COMMUNITY)
Admission: RE | Admit: 2022-08-17 | Discharge: 2022-08-17 | Disposition: A | Discharge status: Home / Self Care | Payer: Medicare Other | Source: Ambulatory Visit | Attending: Cardiology | Admitting: Cardiology

## 2022-08-17 VITALS — BP 110/78 | HR 112 | Wt 183.0 lb

## 2022-08-17 DIAGNOSIS — I214 Non-ST elevation (NSTEMI) myocardial infarction: Secondary | ICD-10-CM

## 2022-08-17 DIAGNOSIS — R7989 Other specified abnormal findings of blood chemistry: Secondary | ICD-10-CM | POA: Diagnosis not present

## 2022-08-17 DIAGNOSIS — I509 Heart failure, unspecified: Secondary | ICD-10-CM | POA: Diagnosis not present

## 2022-08-17 DIAGNOSIS — I252 Old myocardial infarction: Secondary | ICD-10-CM | POA: Diagnosis not present

## 2022-08-17 DIAGNOSIS — Z01818 Encounter for other preprocedural examination: Secondary | ICD-10-CM | POA: Diagnosis present

## 2022-08-17 DIAGNOSIS — I2510 Atherosclerotic heart disease of native coronary artery without angina pectoris: Secondary | ICD-10-CM | POA: Diagnosis not present

## 2022-08-17 DIAGNOSIS — I5022 Chronic systolic (congestive) heart failure: Secondary | ICD-10-CM | POA: Diagnosis not present

## 2022-08-17 LAB — COMPREHENSIVE METABOLIC PANEL
ALT: 22 U/L (ref 0–44)
AST: 25 U/L (ref 15–41)
Albumin: 2.6 g/dL — ABNORMAL LOW (ref 3.5–5.0)
Alkaline Phosphatase: 97 U/L (ref 38–126)
Anion gap: 12 (ref 5–15)
BUN: 46 mg/dL — ABNORMAL HIGH (ref 8–23)
CO2: 24 mmol/L (ref 22–32)
Calcium: 8.7 mg/dL — ABNORMAL LOW (ref 8.9–10.3)
Chloride: 99 mmol/L (ref 98–111)
Creatinine, Ser: 2.6 mg/dL — ABNORMAL HIGH (ref 0.61–1.24)
GFR, Estimated: 24 mL/min — ABNORMAL LOW (ref >60)
Glucose, Bld: 209 mg/dL — ABNORMAL HIGH (ref 70–99)
Potassium: 4.2 mmol/L (ref 3.5–5.1)
Sodium: 135 mmol/L (ref 135–145)
Total Bilirubin: 1.4 mg/dL — ABNORMAL HIGH (ref 0.3–1.2)
Total Protein: 7 g/dL (ref 6.5–8.1)

## 2022-08-17 LAB — BRAIN NATRIURETIC PEPTIDE: B Natriuretic Peptide: 858.7 pg/mL — ABNORMAL HIGH (ref 0.0–100.0)

## 2022-08-17 NOTE — Patient Instructions (Addendum)
Thank you for coming in today  Labs were done today, if any labs are abnormal the clinic will call you No news is good news  You have been referred to Palliative care and their office will contact you for further details   Your physician recommends that you schedule a follow-up appointment in:  2 months in clinic    Do the following things EVERYDAY: Weigh yourself in the morning before breakfast. Write it down and keep it in a log. Take your medicines as prescribed Eat low salt foods--Limit salt (sodium) to 2000 mg per day.  Stay as active as you can everyday Limit all fluids for the day to less than 2 liters  At the [facility], you and your health needs are our priority. As part of our continuing mission to provide you with exceptional heart care, we have created designated Provider Care Teams. These Care Teams include your primary Cardiologist (physician) and Advanced Practice Providers (APPs- Physician Assistants and Nurse Practitioners) who all work together to provide you with the care you need, when you need it.   You may see any of the following providers on your designated Care Team at your next follow up: Dr Glori Bickers Dr Loralie Champagne Dr. Roxana Hires, NP Lyda Jester, [place] [facility] [place], [place] Forestine Na, NP Audry Riles, PharmD   Please be sure to bring in all your medications bottles to every appointment.   If you have any questions or concerns before your next appointment please send [place] a message through [place] or call our office at [phone].    TO LEAVE A MESSAGE FOR THE NURSE SELECT OPTION 2, PLEASE LEAVE A MESSAGE INCLUDING: YOUR NAME DATE OF BIRTH CALL BACK NUMBER REASON FOR CALL**this is important as we prioritize the call backs  YOU WILL RECEIVE A CALL BACK THE SAME DAY AS LONG AS YOU CALL BEFORE 4:00 PM

## 2022-08-17 NOTE — Progress Notes (Signed)
Advanced Heart Failure Clinic Note   Referring Physician: PCP: Monico Blitz, MD PCP-Cardiologist: None  AHF: Dr. 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   He initially presented to [facility] ED on 11/22/2019 for evaluation of fatigue and tarry stools. He reported worsening dyspnea + LE edema and exertional fatigue for the past month. NYHA IIIb-IV. No chest pain. He initially thought his symptoms might be secondary to a GI ulcer as he reports similar symptoms when he had significant anemia in the past. Not on a diuretic at home and reported his family doctor told him he had an "elevated creatinine" in the past.    Echo 4/21 with newly reduced EF 30-35% and moderate RV dysfunction. Previous echo 2013 EF 50-55%. Myoview 11/18 EF 52% with fixed inferolateral defect. His device was interrogated and did not show high burden RV pacing rate.. Given elevated SCr, NST was opted for initial ischemic testing instead of LHC. NST showed large defect in the anterolateral (base,mid, distal), inferolateral (base, mid, distal) and inferior (base, mid) walls that improves minimally in inferior mid region consistent with large area of scar with minimal periinfarct ischemia. Small defect in the anterior mid region that improves consistent with a small region of mild ischemia. EF 28%.  It was suspected he likely had occluded his SVG- OM but, given small area of ischemia, risk of LHC outweighs benefit. Decision was made to treat medically.   Recently admitted 1/24 for NSTEMI and COVID infection/ ACOPDE and afib w/ RVR. Denied CP. Reported feeling unwell. Serial trop 10232 --> 9832 --> 8473 --> 4576. Echo 1/24 EF 25%, RV mod reduced, mod MR. Did not get cath given SCr >3. NSTEMI treated medicaly. He was made DNR during admission. Treating team mentioned  consideration of referral to outpatient hospice at d/c.   He presents today for f/u for HF. Here w/ daughter. Treatment w/ GDMT has been limited by hypotension and CKD. Overall feels better but still w/ some weakness and dizziness w/ positional changes. Denies syncope. No falls. No resting dyspnea. NYHA Class II symptoms. No LEE. Denies melena/hematochezia. No CP.  EKG shows chronic afib, 112 bpm. Has only been able to tolerate low dose metoprolol. BP 110/78 but as noted above, c/w some orthostasis.     Past Medical History:  Diagnosis Date   AAA (abdominal aortic aneurysm) (HCC)    COPD (chronic obstructive pulmonary disease) (HCC)    Coronary artery disease 2000   Status post CABG   Dyslipidemia    HFrEF (heart failure with reduced ejection fraction) (HCC)    History of GI bleed    History of kidney stones    Hypertension    Obesity    Osteoarthritis    PAF (paroxysmal atrial fibrillation) (HCC)    Prostate cancer (HCC)    Sleep apnea    USING CPAP   Spinal stenosis    SSS (sick sinus syndrome) ([place]) 2000   s/p PPM   Type 2 diabetes mellitus (HCC)     Current Outpatient Medications  Medication Sig Dispense Refill   acetaminophen (TYLENOL) 500 MG tablet Take 1,000 mg by mouth every 6 (six) hours as needed for moderate pain or headache.      aspirin EC 81 MG tablet Take 1 tablet (81 mg total) by mouth daily. Restart in 2 weeks  atorvastatin (LIPITOR) 40 MG tablet Take 40 mg by mouth at bedtime.      clopidogrel (PLAVIX) 75 MG tablet Take 1 tablet (75 mg total) by mouth daily. 30 tablet 0   cyclobenzaprine (FLEXERIL) 10 MG tablet Take 10 mg by mouth at bedtime.     febuxostat (ULORIC) 40 MG tablet Take 40 mg by mouth daily.     furosemide (LASIX) 40 MG tablet TAKE 1 TABLET BY MOUTH EVERY DAY 90 tablet 1   glimepiride (AMARYL) 4 MG tablet Take 4 mg by mouth daily.     guaiFENesin-dextromethorphan (ROBITUSSIN DM) 100-10 MG/5ML syrup Take 10 mLs by mouth every 4 (four) hours as  needed for cough. 118 mL 0   metoprolol succinate (TOPROL-XL) 25 MG 24 hr tablet Take 0.5 tablets (12.5 mg total) by mouth daily. Take with or immediately following a meal. 15 tablet 0   nitroGLYCERIN (NITROSTAT) 0.4 MG SL tablet Place 0.4 mg under the tongue every 5 (five) minutes as needed for chest pain.      pantoprazole (PROTONIX) 40 MG tablet TAKE 1 TABLET BY MOUTH EVERY DAY BEFORE BREAKFAST 30 tablet 1   traZODone (DESYREL) 50 MG tablet Take 50 mg by mouth at bedtime as needed for sleep.     No current facility-administered medications for this encounter.    No Known Allergies    Social History   Socioeconomic History   Marital status: Married    Spouse name: Dot   Number of children: 2   Years of education: Not on file   Highest education level: Not on file  Occupational History   Occupation: PHARMACIST    Employer: OTHER    Comment: [employer]   Tobacco Use   Smoking status: Former    Packs/day: 1.00    Years: 42.00    Total pack years: 42.00    Types: Cigarettes    Start date: 01/28/1961    Quit date: 10/30/1998    Years since quitting: 23.8   Smokeless tobacco: Never  Vaping Use   Vaping Use: Never used  Substance and Sexual Activity   Alcohol use: No    Alcohol/week: 0.0 standard drinks of alcohol   Drug use: No   Sexual activity: Not Currently  Other Topics Concern   Not on file  Social History Narrative   Lives with wife in a 2 story home.  They have 2 grown children.     Retired Software engineer.     Investment banker, operational of Radio broadcast assistant Strain: Not on file  Food Insecurity: Not on file  Transportation Needs: Not on file  Physical Activity: Not on file  Stress: Not on file  Social Connections: Not on file  Intimate Partner Violence: Not At Risk (03/14/2018)   Humiliation, Afraid, Rape, and Kick questionnaire    Fear of Current or Ex-Partner: No    Emotionally Abused: No    Physically Abused: No    Sexually Abused: No      Family History   Problem Relation Age of Onset   CAD Father    Stroke Father        Deceased, 56   Hypertension Mother    Stroke Mother        Deceased, 61   Heart disease Mother    Stroke Brother        Deceased, 64   Hypertension Sister        Deceased, 92   Heart disease Sister    Healthy Son  Breast cancer Daughter    Prostate cancer Neg Hx    Colon cancer Neg Hx    Pancreatic cancer Neg Hx     Vitals:   08/17/22 1516  BP: 110/78  Pulse: (!) 112  SpO2: 93%  Weight: 83 kg (183 lb)    PHYSICAL EXAM: General:  Elderly, chronically ill appearing, ambulates w/ walker. No respiratory difficulty HEENT: normal Neck: supple. JVD 8 cm. Carotids 2+ bilat; no bruits. No lymphadenopathy or thyromegaly appreciated. Cor: PMI nondisplaced. Irregularly irregular rhythm and tachy rate. No rubs, gallops or murmurs. Lungs: clear Abdomen: soft, nontender, mildly distended. No hepatosplenomegaly. No bruits or masses. Good bowel sounds. Extremities: no cyanosis, clubbing, rash, edema Neuro: alert & oriented x 3, cranial nerves grossly intact. moves all 4 extremities w/o difficulty. Affect pleasant.    ASSESSMENT & PLAN:  1. Chronic Systolic Heart Failure  - Echo 4/21 with LVEF at 30-35% and global hypokinesis (previous EF 52% in 2018) - RHC 4/21 w/ preserved cardiac output.  LHC deferred due to CKD. Myoview 4/21 showed large defect in the anterolateral (base,mid, distal), inferolateral (base, mid, distal)and inferior (base, mid) walls that improves minimally in inferior mid region consistent with large area of scar with minimal periinfarct ischemia. EF 28%. Suspect he likely has occluded his SVG- OM.  - Echo 1/24 Echo 1/24 EF 25%, RV mod reduced, mod MR.  - Stable NYHA II-early III. GDMT limited by hypotension/orthostasis and CKD  - Appears mildly fluid overloaded on exam w/ abdominal edema, BNP elevated at 858. SCr stable, 2.6 c/w baseline  - Switch from Lasix to Torsemide given better GI  absorption, start 20 mg daily. Plan repeat BMP and BNP in 1 wk  - Continue Spiro 25 mg daily  (K ok on labs today, 4.1) - No ARB/ARNI due to CKD  - Off hydralazine/imdur due to low BP - Continue Toprol XL 12.5 mg daily.  - Could consider SGLT2i but would need to consult w/ nephrology   2. CAD - Has known CAD and is s/p CABG in 2000 (LIMA to the LAD, saphenous vein graft to the OM1 and OM2 and posterior descending).   -  NSTEMI 4/21 and drop in EF, ideally would opt for coronary/graft angiography. However due to elevated SCr, he had Myoview which shoed large scar and minimal ischemia - recent NSTEMI 2/14, medically managed  - No current s/s angina - Continue ASA and statin. Off plavix due to hematuria   4. CKD IV - SCr baseline now ~2.5-3.0. Stable at 2.6 today  - refer to [facility]  5. H/o Bradycardia - s/p PPM, followed by EP    6. T2DM - followed by PCP  7. Atrial Fibrillation  - resting rates mildly elevated - unable to treat w/ rhythm control as he is unable to tolerate anticoagulation due to bleeding - aggressive tx w/ ? blockers limited by hypotension  - not anticoagulated given h/o severe bleeding   8. GOC - now DNR - place outpatient palliative care referral   F/u w/ APP in 2 months   Sheridyn Canino Rosita Fire, PA-C 08/18/22

## 2022-08-18 ENCOUNTER — Telehealth: Payer: Self-pay

## 2022-08-18 ENCOUNTER — Encounter (HOSPITAL_COMMUNITY): Payer: Self-pay

## 2022-08-18 DIAGNOSIS — N179 Acute kidney failure, unspecified: Secondary | ICD-10-CM | POA: Diagnosis not present

## 2022-08-18 DIAGNOSIS — I214 Non-ST elevation (NSTEMI) myocardial infarction: Secondary | ICD-10-CM | POA: Diagnosis not present

## 2022-08-18 DIAGNOSIS — J449 Chronic obstructive pulmonary disease, unspecified: Secondary | ICD-10-CM | POA: Diagnosis not present

## 2022-08-18 DIAGNOSIS — I5022 Chronic systolic (congestive) heart failure: Secondary | ICD-10-CM | POA: Diagnosis not present

## 2022-08-18 DIAGNOSIS — L89322 Pressure ulcer of left buttock, stage 2: Secondary | ICD-10-CM | POA: Diagnosis not present

## 2022-08-18 DIAGNOSIS — I130 Hypertensive heart and chronic kidney disease with heart failure and stage 1 through stage 4 chronic kidney disease, or unspecified chronic kidney disease: Secondary | ICD-10-CM | POA: Diagnosis not present

## 2022-08-18 NOTE — Telephone Encounter (Signed)
(  3:06 pm) PC SW schedule an initial telephonic consult with patient. That consult with be on 08/22/22 @ 4 pm.

## 2022-08-22 ENCOUNTER — Other Ambulatory Visit: Payer: Self-pay

## 2022-08-22 ENCOUNTER — Telehealth (HOSPITAL_COMMUNITY): Payer: Self-pay | Admitting: *Deleted

## 2022-08-22 ENCOUNTER — Other Ambulatory Visit (HOSPITAL_COMMUNITY): Payer: Self-pay | Admitting: *Deleted

## 2022-08-22 DIAGNOSIS — I130 Hypertensive heart and chronic kidney disease with heart failure and stage 1 through stage 4 chronic kidney disease, or unspecified chronic kidney disease: Secondary | ICD-10-CM | POA: Diagnosis not present

## 2022-08-22 DIAGNOSIS — N179 Acute kidney failure, unspecified: Secondary | ICD-10-CM | POA: Diagnosis not present

## 2022-08-22 DIAGNOSIS — I5022 Chronic systolic (congestive) heart failure: Secondary | ICD-10-CM | POA: Diagnosis not present

## 2022-08-22 DIAGNOSIS — J449 Chronic obstructive pulmonary disease, unspecified: Secondary | ICD-10-CM | POA: Diagnosis not present

## 2022-08-22 DIAGNOSIS — I214 Non-ST elevation (NSTEMI) myocardial infarction: Secondary | ICD-10-CM | POA: Diagnosis not present

## 2022-08-22 DIAGNOSIS — L89322 Pressure ulcer of left buttock, stage 2: Secondary | ICD-10-CM | POA: Diagnosis not present

## 2022-08-22 MED ORDER — TORSEMIDE 20 MG PO TABS: 20.0000 mg | ORAL_TABLET | Freq: Every day | ORAL | 3 refills | Status: AC

## 2022-08-22 NOTE — Telephone Encounter (Signed)
Routed to [employer] is out of the office)  Pts caregiver left vm stating pt has a lot of swelling in his abdomen. She said patient was supposed to be switched from lasix to torsemide but that was not done. Last office note states  "Switch from Lasix to Torsemide given better GI absorption, start 20 mg daily. Plan repeat BMP and BNP in 1 wk" but there were no med changes made that day and AVS created by Thead[facility] Nan does not reflect med change.

## 2022-08-22 NOTE — Telephone Encounter (Signed)
Pts caregiver left vm stating pt has a lot of swelling in his abdomen. She said patient was supposed to be switched from lasix to torsemide but that was not done. Last office note states  "Switch from Lasix to Torsemide given better GI absorption, start 20 mg daily. Plan repeat BMP and BNP in 1 wk" but there were no med changes made that day and AVS created by Theadore Nan does not reflect med change.   Routed to [employer] for advice .

## 2022-08-22 NOTE — Telephone Encounter (Signed)
Caregiver aware. Script sent. Order for labs mailed to pts address.

## 2022-08-23 DIAGNOSIS — I214 Non-ST elevation (NSTEMI) myocardial infarction: Secondary | ICD-10-CM | POA: Diagnosis not present

## 2022-08-25 DIAGNOSIS — C61 Malignant neoplasm of prostate: Secondary | ICD-10-CM | POA: Diagnosis not present

## 2022-08-25 DIAGNOSIS — N179 Acute kidney failure, unspecified: Secondary | ICD-10-CM | POA: Diagnosis not present

## 2022-08-25 DIAGNOSIS — E0842 Diabetes mellitus due to underlying condition with diabetic polyneuropathy: Secondary | ICD-10-CM | POA: Diagnosis not present

## 2022-08-25 DIAGNOSIS — E11649 Type 2 diabetes mellitus with hypoglycemia without coma: Secondary | ICD-10-CM | POA: Diagnosis not present

## 2022-08-25 DIAGNOSIS — L8990 Pressure ulcer of unspecified site, unspecified stage: Secondary | ICD-10-CM | POA: Diagnosis not present

## 2022-08-25 DIAGNOSIS — M169 Osteoarthritis of hip, unspecified: Secondary | ICD-10-CM | POA: Diagnosis not present

## 2022-08-25 DIAGNOSIS — J449 Chronic obstructive pulmonary disease, unspecified: Secondary | ICD-10-CM | POA: Diagnosis not present

## 2022-08-25 DIAGNOSIS — I10 Essential (primary) hypertension: Secondary | ICD-10-CM | POA: Diagnosis not present

## 2022-08-25 DIAGNOSIS — N189 Chronic kidney disease, unspecified: Secondary | ICD-10-CM | POA: Diagnosis not present

## 2022-08-25 DIAGNOSIS — G4730 Sleep apnea, unspecified: Secondary | ICD-10-CM | POA: Diagnosis not present

## 2022-08-25 DIAGNOSIS — I495 Sick sinus syndrome: Secondary | ICD-10-CM | POA: Diagnosis not present

## 2022-08-25 DIAGNOSIS — I5020 Unspecified systolic (congestive) heart failure: Secondary | ICD-10-CM | POA: Diagnosis not present

## 2022-08-25 DIAGNOSIS — I714 Abdominal aortic aneurysm, without rupture, unspecified: Secondary | ICD-10-CM | POA: Diagnosis not present

## 2022-08-28 DIAGNOSIS — C61 Malignant neoplasm of prostate: Secondary | ICD-10-CM | POA: Diagnosis not present

## 2022-08-28 DIAGNOSIS — M169 Osteoarthritis of hip, unspecified: Secondary | ICD-10-CM | POA: Diagnosis not present

## 2022-08-28 DIAGNOSIS — E0842 Diabetes mellitus due to underlying condition with diabetic polyneuropathy: Secondary | ICD-10-CM | POA: Diagnosis not present

## 2022-08-28 DIAGNOSIS — I495 Sick sinus syndrome: Secondary | ICD-10-CM | POA: Diagnosis not present

## 2022-08-28 DIAGNOSIS — I5020 Unspecified systolic (congestive) heart failure: Secondary | ICD-10-CM | POA: Diagnosis not present

## 2022-08-28 DIAGNOSIS — I10 Essential (primary) hypertension: Secondary | ICD-10-CM | POA: Diagnosis not present

## 2022-08-29 DIAGNOSIS — E0842 Diabetes mellitus due to underlying condition with diabetic polyneuropathy: Secondary | ICD-10-CM | POA: Diagnosis not present

## 2022-08-29 DIAGNOSIS — C61 Malignant neoplasm of prostate: Secondary | ICD-10-CM | POA: Diagnosis not present

## 2022-08-29 DIAGNOSIS — I10 Essential (primary) hypertension: Secondary | ICD-10-CM | POA: Diagnosis not present

## 2022-08-29 DIAGNOSIS — I495 Sick sinus syndrome: Secondary | ICD-10-CM | POA: Diagnosis not present

## 2022-08-29 DIAGNOSIS — M169 Osteoarthritis of hip, unspecified: Secondary | ICD-10-CM | POA: Diagnosis not present

## 2022-08-29 DIAGNOSIS — I5020 Unspecified systolic (congestive) heart failure: Secondary | ICD-10-CM | POA: Diagnosis not present

## 2022-08-30 ENCOUNTER — Telehealth: Payer: Self-pay

## 2022-08-30 DIAGNOSIS — M169 Osteoarthritis of hip, unspecified: Secondary | ICD-10-CM | POA: Diagnosis not present

## 2022-08-30 DIAGNOSIS — C61 Malignant neoplasm of prostate: Secondary | ICD-10-CM | POA: Diagnosis not present

## 2022-08-30 DIAGNOSIS — E0842 Diabetes mellitus due to underlying condition with diabetic polyneuropathy: Secondary | ICD-10-CM | POA: Diagnosis not present

## 2022-08-30 DIAGNOSIS — I495 Sick sinus syndrome: Secondary | ICD-10-CM | POA: Diagnosis not present

## 2022-08-30 DIAGNOSIS — I10 Essential (primary) hypertension: Secondary | ICD-10-CM | POA: Diagnosis not present

## 2022-08-30 DIAGNOSIS — I5020 Unspecified systolic (congestive) heart failure: Secondary | ICD-10-CM | POA: Diagnosis not present

## 2022-08-30 NOTE — Telephone Encounter (Signed)
DISCHARGE FROM PALLIATIVE CARE (11:07 am) PC SW completed a follow-up call to patient's wife-Dorothy to assess status and needs. Patient is now on hospice services. Patient discharged from palliative care services.

## 2022-08-31 DIAGNOSIS — C61 Malignant neoplasm of prostate: Secondary | ICD-10-CM | POA: Diagnosis not present

## 2022-08-31 DIAGNOSIS — E11649 Type 2 diabetes mellitus with hypoglycemia without coma: Secondary | ICD-10-CM | POA: Diagnosis not present

## 2022-08-31 DIAGNOSIS — J449 Chronic obstructive pulmonary disease, unspecified: Secondary | ICD-10-CM | POA: Diagnosis not present

## 2022-08-31 DIAGNOSIS — G4730 Sleep apnea, unspecified: Secondary | ICD-10-CM | POA: Diagnosis not present

## 2022-08-31 DIAGNOSIS — I714 Abdominal aortic aneurysm, without rupture, unspecified: Secondary | ICD-10-CM | POA: Diagnosis not present

## 2022-08-31 DIAGNOSIS — I495 Sick sinus syndrome: Secondary | ICD-10-CM | POA: Diagnosis not present

## 2022-08-31 DIAGNOSIS — M169 Osteoarthritis of hip, unspecified: Secondary | ICD-10-CM | POA: Diagnosis not present

## 2022-08-31 DIAGNOSIS — N179 Acute kidney failure, unspecified: Secondary | ICD-10-CM | POA: Diagnosis not present

## 2022-08-31 DIAGNOSIS — N189 Chronic kidney disease, unspecified: Secondary | ICD-10-CM | POA: Diagnosis not present

## 2022-08-31 DIAGNOSIS — I10 Essential (primary) hypertension: Secondary | ICD-10-CM | POA: Diagnosis not present

## 2022-08-31 DIAGNOSIS — L8990 Pressure ulcer of unspecified site, unspecified stage: Secondary | ICD-10-CM | POA: Diagnosis not present

## 2022-08-31 DIAGNOSIS — E0842 Diabetes mellitus due to underlying condition with diabetic polyneuropathy: Secondary | ICD-10-CM | POA: Diagnosis not present

## 2022-08-31 DIAGNOSIS — I5020 Unspecified systolic (congestive) heart failure: Secondary | ICD-10-CM | POA: Diagnosis not present

## 2022-09-03 DIAGNOSIS — C61 Malignant neoplasm of prostate: Secondary | ICD-10-CM | POA: Diagnosis not present

## 2022-09-03 DIAGNOSIS — I5020 Unspecified systolic (congestive) heart failure: Secondary | ICD-10-CM | POA: Diagnosis not present

## 2022-09-03 DIAGNOSIS — I495 Sick sinus syndrome: Secondary | ICD-10-CM | POA: Diagnosis not present

## 2022-09-03 DIAGNOSIS — E0842 Diabetes mellitus due to underlying condition with diabetic polyneuropathy: Secondary | ICD-10-CM | POA: Diagnosis not present

## 2022-09-03 DIAGNOSIS — M169 Osteoarthritis of hip, unspecified: Secondary | ICD-10-CM | POA: Diagnosis not present

## 2022-09-03 DIAGNOSIS — I10 Essential (primary) hypertension: Secondary | ICD-10-CM | POA: Diagnosis not present

## 2022-09-18 ENCOUNTER — Inpatient Hospital Stay (HOSPITAL_COMMUNITY): Admission: RE | Admit: 2022-09-18 | Payer: Medicare Other | Source: Ambulatory Visit

## 2022-09-29 DEATH — deceased

## 2022-10-17 ENCOUNTER — Encounter (HOSPITAL_COMMUNITY): Payer: Medicare Other
